# Patient Record
Sex: Male | Born: 1950
Health system: Southern US, Community
[De-identification: ages and names within clinical notes are randomized; demographics above are authoritative.]

## PROBLEM LIST (undated history)

## (undated) DIAGNOSIS — K4090 Unilateral inguinal hernia, without obstruction or gangrene, not specified as recurrent: Secondary | ICD-10-CM

## (undated) DIAGNOSIS — R911 Solitary pulmonary nodule: Secondary | ICD-10-CM

## (undated) DIAGNOSIS — R0683 Snoring: Secondary | ICD-10-CM

## (undated) DIAGNOSIS — G4733 Obstructive sleep apnea (adult) (pediatric): Secondary | ICD-10-CM

## (undated) DIAGNOSIS — M722 Plantar fascial fibromatosis: Secondary | ICD-10-CM

## (undated) DIAGNOSIS — E785 Hyperlipidemia, unspecified: Secondary | ICD-10-CM

## (undated) DIAGNOSIS — M791 Myalgia, unspecified site: Secondary | ICD-10-CM

## (undated) DIAGNOSIS — G8929 Other chronic pain: Secondary | ICD-10-CM

## (undated) DIAGNOSIS — R609 Edema, unspecified: Secondary | ICD-10-CM

## (undated) DIAGNOSIS — J342 Deviated nasal septum: Secondary | ICD-10-CM

## (undated) DIAGNOSIS — S42309A Unspecified fracture of shaft of humerus, unspecified arm, initial encounter for closed fracture: Secondary | ICD-10-CM

## (undated) DIAGNOSIS — G4730 Sleep apnea, unspecified: Secondary | ICD-10-CM

## (undated) DIAGNOSIS — R079 Chest pain, unspecified: Secondary | ICD-10-CM

## (undated) DIAGNOSIS — I10 Essential (primary) hypertension: Secondary | ICD-10-CM

## (undated) DIAGNOSIS — M1990 Unspecified osteoarthritis, unspecified site: Secondary | ICD-10-CM

## (undated) DIAGNOSIS — N200 Calculus of kidney: Secondary | ICD-10-CM

## (undated) DIAGNOSIS — K219 Gastro-esophageal reflux disease without esophagitis: Secondary | ICD-10-CM

## (undated) DIAGNOSIS — G5600 Carpal tunnel syndrome, unspecified upper limb: Secondary | ICD-10-CM

## (undated) DIAGNOSIS — F17201 Nicotine dependence, unspecified, in remission: Secondary | ICD-10-CM

## (undated) DIAGNOSIS — M2550 Pain in unspecified joint: Secondary | ICD-10-CM

## (undated) DIAGNOSIS — K5790 Diverticulosis of intestine, part unspecified, without perforation or abscess without bleeding: Secondary | ICD-10-CM

## (undated) DIAGNOSIS — Z9989 Dependence on other enabling machines and devices: Secondary | ICD-10-CM

## (undated) HISTORY — DX: Unspecified osteoarthritis, unspecified site: M19.90

## (undated) HISTORY — PX: PLANTAR FASCIA SURGERY: SHX746

## (undated) HISTORY — PX: SHOULDER ARTHROSCOPY: SHX128

## (undated) HISTORY — DX: Plantar fascial fibromatosis: M72.2

## (undated) HISTORY — DX: Nicotine dependence, unspecified, in remission: F17.201

## (undated) HISTORY — DX: Myalgia, unspecified site: M79.10

## (undated) HISTORY — PX: KNEE ARTHROSCOPY: SUR90

## (undated) HISTORY — DX: Pain in unspecified joint: M25.50

## (undated) HISTORY — DX: Hyperlipidemia, unspecified: E78.5

## (undated) HISTORY — DX: Obstructive sleep apnea (adult) (pediatric): G47.33

## (undated) HISTORY — DX: Deviated nasal septum: J34.2

## (undated) HISTORY — DX: Dependence on other enabling machines and devices: Z99.89

## (undated) HISTORY — DX: Solitary pulmonary nodule: R91.1

## (undated) HISTORY — DX: Unilateral inguinal hernia, without obstruction or gangrene, not specified as recurrent: K40.90

## (undated) HISTORY — DX: Other chronic pain: G89.29

## (undated) HISTORY — DX: Chest pain, unspecified: R07.9

## (undated) HISTORY — DX: Essential (primary) hypertension: I10

## (undated) HISTORY — DX: Calculus of kidney: N20.0

## (undated) HISTORY — DX: Edema, unspecified: R60.9

## (undated) HISTORY — DX: Unspecified fracture of shaft of humerus, unspecified arm, initial encounter for closed fracture: S42.309A

## (undated) HISTORY — DX: Gastro-esophageal reflux disease without esophagitis: K21.9

## (undated) HISTORY — PX: UPPER GI ENDOSCOPY: SHX6162

## (undated) HISTORY — PX: OTHER SURGICAL HISTORY: SHX169

## (undated) HISTORY — DX: Snoring: R06.83

## (undated) HISTORY — DX: Carpal tunnel syndrome, unspecified upper limb: G56.00

## (undated) HISTORY — DX: Diverticulosis of intestine, part unspecified, without perforation or abscess without bleeding: K57.90

## (undated) HISTORY — PX: COLONOSCOPY: SHX174

## (undated) HISTORY — DX: Sleep apnea, unspecified: G47.30

---

## 1991-09-18 HISTORY — PX: LUMBAR DISC SURGERY: SHX700

## 2007-03-18 ENCOUNTER — Ambulatory Visit: Payer: Self-pay | Admitting: Gastroenterology

## 2007-03-27 ENCOUNTER — Encounter: Payer: Self-pay | Admitting: Gastroenterology

## 2007-03-27 ENCOUNTER — Ambulatory Visit: Payer: Self-pay | Admitting: Gastroenterology

## 2007-05-14 ENCOUNTER — Ambulatory Visit: Payer: Self-pay | Admitting: Gastroenterology

## 2007-12-12 DIAGNOSIS — K209 Esophagitis, unspecified without bleeding: Secondary | ICD-10-CM | POA: Insufficient documentation

## 2007-12-12 DIAGNOSIS — R1310 Dysphagia, unspecified: Secondary | ICD-10-CM | POA: Insufficient documentation

## 2007-12-12 DIAGNOSIS — K2980 Duodenitis without bleeding: Secondary | ICD-10-CM | POA: Insufficient documentation

## 2007-12-12 DIAGNOSIS — K219 Gastro-esophageal reflux disease without esophagitis: Secondary | ICD-10-CM | POA: Insufficient documentation

## 2007-12-12 DIAGNOSIS — K5730 Diverticulosis of large intestine without perforation or abscess without bleeding: Secondary | ICD-10-CM | POA: Insufficient documentation

## 2009-03-09 ENCOUNTER — Encounter: Admission: RE | Admit: 2009-03-09 | Discharge: 2009-03-09 | Payer: Self-pay | Admitting: Unknown Physician Specialty

## 2009-03-15 ENCOUNTER — Encounter: Payer: Self-pay | Admitting: Physician Assistant

## 2009-04-25 ENCOUNTER — Encounter: Payer: Self-pay | Admitting: Physician Assistant

## 2009-09-14 ENCOUNTER — Encounter: Payer: Self-pay | Admitting: Physician Assistant

## 2009-09-15 ENCOUNTER — Encounter: Admission: RE | Admit: 2009-09-15 | Discharge: 2009-09-15 | Payer: Self-pay | Admitting: Unknown Physician Specialty

## 2009-09-15 ENCOUNTER — Encounter (INDEPENDENT_AMBULATORY_CARE_PROVIDER_SITE_OTHER): Payer: Self-pay | Admitting: *Deleted

## 2009-09-22 ENCOUNTER — Telehealth: Payer: Self-pay | Admitting: Gastroenterology

## 2009-09-23 ENCOUNTER — Ambulatory Visit: Payer: Self-pay | Admitting: Gastroenterology

## 2009-09-23 DIAGNOSIS — R1032 Left lower quadrant pain: Secondary | ICD-10-CM | POA: Insufficient documentation

## 2009-09-23 DIAGNOSIS — Z86010 Personal history of colonic polyps: Secondary | ICD-10-CM | POA: Insufficient documentation

## 2010-04-19 ENCOUNTER — Encounter (INDEPENDENT_AMBULATORY_CARE_PROVIDER_SITE_OTHER): Payer: Self-pay | Admitting: *Deleted

## 2010-06-09 ENCOUNTER — Telehealth: Payer: Self-pay | Admitting: Gastroenterology

## 2010-06-14 ENCOUNTER — Encounter (INDEPENDENT_AMBULATORY_CARE_PROVIDER_SITE_OTHER): Payer: Self-pay | Admitting: *Deleted

## 2010-07-06 ENCOUNTER — Encounter (INDEPENDENT_AMBULATORY_CARE_PROVIDER_SITE_OTHER): Payer: Self-pay | Admitting: *Deleted

## 2010-07-10 ENCOUNTER — Ambulatory Visit: Payer: Self-pay | Admitting: Gastroenterology

## 2010-07-18 ENCOUNTER — Encounter (INDEPENDENT_AMBULATORY_CARE_PROVIDER_SITE_OTHER): Payer: Self-pay | Admitting: *Deleted

## 2010-07-18 ENCOUNTER — Telehealth (INDEPENDENT_AMBULATORY_CARE_PROVIDER_SITE_OTHER): Payer: Self-pay | Admitting: *Deleted

## 2010-07-27 ENCOUNTER — Ambulatory Visit: Payer: Self-pay | Admitting: Gastroenterology

## 2010-10-18 NOTE — Letter (Signed)
Summary: [facility] Instructions  [facility]  [address] [place], [place] [address]   Phone: [phone]  Fax: [phone]       Nicholas Graham    1955/01/24    MRN: [id number]       Procedure Day Dorna Bloom:  Lenor Coffin  07/27/10     Arrival Time: 7:30AM     Procedure Time:  8:30AM     Location of Procedure:                    _ X_   [facility] (4th Floor)  PREPARATION FOR COLONOSCOPY WITH MIRALAX  Starting 5 days prior to your procedure 07/22/10 do not eat nuts, seeds, popcorn, corn, beans, peas,  salads, or any raw vegetables.  Do not take any fiber supplements (e.g. Metamucil, Citrucel, and Benefiber). ____________________________________________________________________________________________________   THE DAY BEFORE YOUR PROCEDURE         DATE: 07/26/10  DAY: WEDNESDAY  1   Drink clear liquids the entire day-NO SOLID FOOD  2   Do not drink anything colored red or purple.  Avoid juices with pulp.  No orange juice.  3   Drink at least 64 oz. (8 glasses) of fluid/clear liquids during the day to prevent dehydration and help the prep work efficiently.  CLEAR LIQUIDS INCLUDE: Water Jello Ice Popsicles Tea (sugar ok, no milk/cream) Powdered fruit flavored drinks Coffee (sugar ok, no milk/cream) Gatorade Juice: apple, white grape, white cranberry  Lemonade Clear bullion, consomm, broth Carbonated beverages (any kind) Strained chicken noodle soup Hard Candy  4   Mix the entire bottle of Miralax with 64 oz. of Gatorade/Powerade in the morning and put in the refrigerator to chill.  5   At 3:00 pm take 2 Dulcolax/Bisacodyl tablets.  6   At 4:30 pm take one Reglan/Metoclopramide tablet.  7  Starting at 5:00 pm drink one 8 oz glass of the Miralax mixture every 15-20 minutes until you have finished drinking the entire 64 oz.  You should finish drinking prep around 7:30 or 8:00 pm.  8   If you are nauseated, you may take the 2nd Reglan/Metoclopramide  tablet at 6:30 pm.        9    At 8:00 pm take 2 more DULCOLAX/Bisacodyl tablets.   THE DAY OF YOUR PROCEDURE      DATE:  07/27/10   DAY: Lenor Coffin  You may drink clear liquids until 6:30AM (2 HOURS BEFORE PROCEDURE).   MEDICATION INSTRUCTIONS  Unless otherwise instructed, you should take regular prescription medications with a small sip of water as early as possible the morning of your procedure.       OTHER INSTRUCTIONS  You will need a responsible adult at least 58 years of age to accompany you and drive you home.   This person must remain in the waiting room during your procedure.  Wear loose fitting clothing that is easily removed.  Leave jewelry and other valuables at home.  However, you may wish to bring a book to read or an iPod/MP3 player to listen to music as you wait for your procedure to start.  Remove all body piercing jewelry and leave at home.  Total time from sign-in until discharge is approximately 2-3 hours.  You should go home directly after your procedure and rest.  You can resume normal activities the day after your procedure.  The day of your procedure you should not:   Drive   Make legal decisions  Operate machinery   Drink alcohol   Return to work  You will receive specific instructions about eating, activities and medications before you leave.   The above instructions have been reviewed and explained to me by   Wyona Almas RN  July 18, 2010 10:22 AM     I fully understand and can verbalize these instructions _____________________________ Date _______

## 2010-10-18 NOTE — Progress Notes (Signed)
Summary: med concern  Medications Added MIRALAX   POWD (POLYETHYLENE GLYCOL 3350) As per prep  instructions. DULCOLAX 5 MG  TBEC (BISACODYL) Day before procedure take 2 at 3pm and 2 at 8pm. METOCLOPRAMIDE HCL 10 MG  TABS (METOCLOPRAMIDE HCL) As per prep instructions.      New instructions faxed to Pharmacy Phone Note From Pharmacy Call back at [phone]   Caller: Tito Dine tech Call For: Dr. Arlyce Dice  Summary of Call: needs permission to change prep to something cheaper Initial call taken by: Vallarie Mare,  July 18, 2010 9:49 AM    New/Updated Medications: MIRALAX   POWD (POLYETHYLENE GLYCOL 3350) As per prep  instructions. DULCOLAX 5 MG  TBEC (BISACODYL) Day before procedure take 2 at 3pm and 2 at 8pm. METOCLOPRAMIDE HCL 10 MG  TABS (METOCLOPRAMIDE HCL) As per prep instructions. Prescriptions: METOCLOPRAMIDE HCL 10 MG  TABS (METOCLOPRAMIDE HCL) As per prep instructions.  #2 x 0   Entered by:   Wyona Almas RN   Authorized by:   Louis Meckel MD   Signed by:   Wyona Almas RN on 07/18/2010   Method used:   Electronically to        [facility] (retail)       [address]       [place], [place]  [address]       Ph: [phone]       Fax: [phone]   RxID:   [id number] DULCOLAX 5 MG  TBEC (BISACODYL) Day before procedure take 2 at 3pm and 2 at 8pm.  #4 x 0   Entered by:   Wyona Almas RN   Authorized by:   Louis Meckel MD   Signed by:   Wyona Almas RN on 07/18/2010   Method used:   Electronically to        [facility] (retail)       [address]       [place], [place]  [address]       Ph: [phone]       Fax: [phone]   RxID:   [id number] MIRALAX   POWD (POLYETHYLENE GLYCOL 3350) As per prep  instructions.  #255gm x 0   Entered by:   Wyona Almas RN   Authorized by:   Louis Meckel MD   Signed by:   Wyona Almas RN on 07/18/2010   Method used:   Electronically to        [facility] (retail)       [address]  [place], [place]  [address]       Ph: [phone]       Fax: [phone]   RxID:   [id number]

## 2010-10-18 NOTE — Letter (Signed)
Summary: [facility]  [facility]   Imported By: Sherian Rein 09/27/2009 11:45:58  _____________________________________________________________________  External Attachment:    Type:   Image     Comment:   External Document

## 2010-10-18 NOTE — Letter (Signed)
Summary: [facility] Instructions  [facility]  [address] [place], [place] [address]   Phone: [phone]  Fax: [phone]       Nicholas Graham    12/11/1964    MRN: [id number]        Procedure Day Dorna Bloom:  Lenor Coffin  07/27/10     Arrival Time:  7:30AM     Procedure Time:  8:30AM     Location of Procedure:                    _ X_  [facility] (4th Floor)                      PREPARATION FOR COLONOSCOPY WITH MOVIPREP   Starting 5 days prior to your procedure 07/22/10 do not eat nuts, seeds, popcorn, corn, beans, peas,  salads, or any raw vegetables.  Do not take any fiber supplements (e.g. Metamucil, Citrucel, and Benefiber).  THE DAY BEFORE YOUR PROCEDURE         DATE: 07/26/10  DAY: WEDNESDAY  1.  Drink clear liquids the entire day-NO SOLID FOOD  2.  Do not drink anything colored red or purple.  Avoid juices with pulp.  No orange juice.  3.  Drink at least 64 oz. (8 glasses) of fluid/clear liquids during the day to prevent dehydration and help the prep work efficiently.  CLEAR LIQUIDS INCLUDE: Water Jello Ice Popsicles Tea (sugar ok, no milk/cream) Powdered fruit flavored drinks Coffee (sugar ok, no milk/cream) Gatorade Juice: apple, white grape, white cranberry  Lemonade Clear bullion, consomm, broth Carbonated beverages (any kind) Strained chicken noodle soup Hard Candy                             4.  In the morning, mix first dose of MoviPrep solution:    Empty 1 Pouch A and 1 Pouch B into the disposable container    Add lukewarm drinking water to the top line of the container. Mix to dissolve    Refrigerate (mixed solution should be used within 24 hrs)  5.  Begin drinking the prep at 5:00 p.m. The MoviPrep container is divided by 4 marks.   Every 15 minutes drink the solution down to the next mark (approximately 8 oz) until the full liter is complete.   6.  Follow completed prep with 16 oz of clear liquid of your choice  (Nothing red or purple).  Continue to drink clear liquids until bedtime.  7.  Before going to bed, mix second dose of MoviPrep solution:    Empty 1 Pouch A and 1 Pouch B into the disposable container    Add lukewarm drinking water to the top line of the container. Mix to dissolve    Refrigerate  THE DAY OF YOUR PROCEDURE      DATE: 07/27/10  DAY: THURSDAY  Beginning at 3:30AM (5 hours before procedure):         1. Every 15 minutes, drink the solution down to the next mark (approx 8 oz) until the full liter is complete.  2. Follow completed prep with 16 oz. of clear liquid of your choice.    3. You may drink clear liquids until 6:30AM (2 HOURS BEFORE PROCEDURE).   MEDICATION INSTRUCTIONS  Unless otherwise instructed, you should take regular prescription medications with a small sip of water   as early as possible the morning of your  procedure.       OTHER INSTRUCTIONS  You will need a responsible adult at least 58 years of age to accompany you and drive you home.   This person must remain in the waiting room during your procedure.  Wear loose fitting clothing that is easily removed.  Leave jewelry and other valuables at home.  However, you may wish to bring a book to read or  an iPod/MP3 player to listen to music as you wait for your procedure to start.  Remove all body piercing jewelry and leave at home.  Total time from sign-in until discharge is approximately 2-3 hours.  You should go home directly after your procedure and rest.  You can resume normal activities the  day after your procedure.  The day of your procedure you should not:   Drive   Make legal decisions   Operate machinery   Drink alcohol   Return to work  You will receive specific instructions about eating, activities and medications before you leave.    The above instructions have been reviewed and explained to me by   Wyona Almas RN  July 10, 2010 5:02 PM     I fully understand  and can verbalize these instructions _____________________________ Date _________

## 2010-10-18 NOTE — Letter (Signed)
Summary: Colonoscopy Letter  [facility]  [address] [place], [place] [address]   Phone: [phone]  Fax: [phone]      April 19, 2010 MRN: [id number]   [facility] [address] [place], [place]  [address]   Dear Mr. Nicholas Graham,   According to your medical record, it is time for you to schedule a Colonoscopy. The American Cancer Society recommends this procedure as a method to detect early colon cancer. Patients with a family history of colon cancer, or a personal history of colon polyps or inflammatory bowel disease are at increased risk.  This letter has been generated based on the recommendations made at the time of your procedure. If you feel that in your particular situation this may no longer apply, please contact our office.  Please call our office at [phone] to schedule this appointment or to update your records at your earliest convenience.  Thank you for cooperating with [place] to provide you with the very best care possible.   Sincerely,   Barbette Hair. Arlyce Dice, M.D.  [facility] [phone]

## 2010-10-18 NOTE — Letter (Signed)
Summary: Lawernce Ion, M.D.  Eugene Garnet Precious Gilding, M.D.   Imported By: Sherian Rein 09/27/2009 11:47:54  _____________________________________________________________________  External Attachment:    Type:   Image     Comment:   External Document

## 2010-10-18 NOTE — Assessment & Plan Note (Signed)
Summary: ABD. PAIN/BLOOD IN STOOLS         (DR.KAPLAN PT.)     DEBORAH    History of Present Illness Visit Type: Follow-up Visit Primary GI MD: Melvia Heaps MD [facility] Primary Provider: Harl Bowie, MD Chief Complaint: Patient having groin pain, he states that th epain is below where he had a hernia repair 4 months ago. He also complains of having blood in his stools and in his urine. He denies any constipation.  History of Present Illness:   60 YO MALE  KNOWN TO DR Arlyce Dice . HE HAD COLONOSCOPY 7/08 WITH DIVERTICULOSIS AND ONE ADENOMATOUS POLYP. ALSO WITH HX GERD,DUODENITIS. HE HAD A LEFT INGUINAL HERNIA REPAIR DONE AT [facility] 8/10 WITH MESH.  HE IS REFERRED TODAY FOR LLQ PAIN. HE SAYS THIS FEELS DIFFERENT THAN HIS HERNIA PAIN,AND IS LOWER IN GROIN. HAS HAD PAIN FOR ABOUT 3-4 WEEKS. 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 THE PAIN IS DESCRIBED AS BURNING,SOME RADIATION TOWARD HIS BACK. HE HAS HAD SOME ONGOING LOW BACK PAIN. APPETITE FINE, WEIGHT GOING UP. NO CHANGE IN BOWEL HABITS UNTIL A WEEK OR SO AGO AFTER HE STARTED PROTONIX AND SIMVISTATIN-STOOLS LOOSE 2-3 /DAY. HE HAD SEEN A SMALL AMT OF BLOOD ON TISSUE WHICH HAS RESOLVED.HE SAW DR JUDGE FOR PHSICAL IN DECEMBER-LABS REVIEWED AND UNREMARLABLE. CT ABD/PELVIS 12/30 WAS READ AS STABLE RIGHT INGUINAL HERNIA,AND ANTERIOR MIDLINE ABDOMINAL WALL HERNIA,NO DIVERTICULITIS. HE SAYS HE WAS TOLD HE HAD SOME MICROSCOPIC HEMATURIA-TO HAVE REPEAT UA NEXT WEEK.   NO CHANGE IN PAIN WITH by mouth INTAKE,WORSE WITH CERTAIN POSITIONS-GETTING UP.   GI Review of Systems    Reports abdominal pain, acid reflux, heartburn, and  weight gain.     Location of  Abdominal pain: LLQ.    Denies belching, bloating, chest pain, dysphagia with liquids, dysphagia with solids, loss of appetite, nausea, vomiting, vomiting blood, and  weight loss.      Reports diverticulosis.     Denies anal fissure, black tarry stools, change in bowel habit, constipation,  diarrhea, fecal incontinence, heme positive stool, hemorrhoids, irritable bowel syndrome, jaundice, light color stool, liver problems, rectal bleeding, and  rectal pain. Preventive Screening-Counseling & Management  Alcohol-Tobacco     Smoking Status: quit      Drug Use:  no.      Current Medications (verified): 1)  Protonix 40 Mg Tbec (Pantoprazole Sodium) .... Take One By Mouth Once Daily 2)  Simvastatin 20 Mg Tabs (Simvastatin) .... Take One By Mouth Once Daily 3)  Tylenol 325 Mg Tabs (Acetaminophen) .... Take As Needed  Allergies (verified): 1)  ! Penicillin  Past History:  Past Medical History: Current Problems:  DIVERTICULOSIS, COLON (ICD-562.10) HYPERLIPIDEMIA ADENOMATOUS POLYP 7/08 GERD (ICD-530.81) DUODENITIS (ICD-535.60)  Past Surgical History: back surgery inguinal hernia left  Family History: Family history of brain cancer: mother Family history of lung cancer: mother (non smoker), father (smoker) No FH of Colon Cancer Family History of Heart Disease: maternal uncles  Social History: Patient is a former smoker. quit 7 years Alcohol Use - yes Daily Caffeine Use coffee Illicit Drug Use - no Smoking Status:  quit Drug Use:  no  Review of Systems       The patient complains of back pain, blood in urine, fatigue, headaches-new, muscle pains/cramps, shortness of breath, and sleeping problems.  The patient denies allergy/sinus, anemia, anxiety-new, arthritis/joint pain, breast changes/lumps, change in vision, confusion, cough, coughing up blood, depression-new, fainting, fever, hearing problems, heart murmur, heart rhythm  changes, itching, menstrual pain, night sweats, nosebleeds, pregnancy symptoms, skin rash, sore throat, swelling of feet/legs, swollen lymph glands, thirst - excessive , urination - excessive , urination changes/pain, urine leakage, vision changes, and voice change.         ROS OTHERWISE AS IN HPI  Vital Signs:  Patient profile:   60 year  old male Height:      74 inches Weight:      232.4 pounds BMI:     29.95 Pulse rate:   88 / minute Pulse rhythm:   regular BP sitting:   130 / 78  (left arm) Cuff size:   regular  Vitals Entered By: Harlow Mares CMA Duncan Dull) (September 23, 2009 1:40 PM)  Physical Exam  General:  Well developed, well nourished, no acute distress. Head:  Normocephalic and atraumatic. Eyes:  PERRLA, no icterus. Lungs:  Clear throughout to auscultation. Heart:  Regular rate and rhythm; no murmurs, rubs,  or bruits. Abdomen:  SOFT, MINIMAL TENDERNESS IN LLQ,NO MASS OR HSM,BS+. HE IS VERY TENDER BELOW HIS INGUINAL HERNIA INCISION ON THE LEFT AND INTO THE LEFT GROIN Rectal:  NOT DONE Extremities:  No clubbing, cyanosis, edema or deformities noted. Neurologic:  Alert and  oriented x4;  grossly normal neurologically. Psych:  Alert and cooperative. Normal mood and affect.   Impression & Recommendations:  Problem # 1:  ABDOMINAL PAIN-LLQ (ICD-789.04) Assessment New I DO NOT THINK HIS PAIN IS INTRA-ABDOMINAL ,SUSPECT HE HAS A RECURRENT OR NEW FEMORAL HERNIA ON THE LEFT.  PT ALREADY HAS APPT TO SEE DR Derrell Lolling NEXT WEEK -ADVISED TO KEEP THIS ULTRAM 50 MG Q 6-8 HOURS AS NEEDED FOR PAIN  Problem # 2:  PERSONAL HX COLONIC POLYPS (ICD-V12.72) Assessment: Comment Only ADENOMATOUS,LAST COLON 7/08  Problem # 3:  DIVERTICULOSIS, COLON (ICD-562.10) Assessment: Comment Only  Problem # 4:  GERD (ICD-530.81) Assessment: Unchanged PT WITH LOOSE STOOLS SINCE STARTING PROTONIX-WILL STOP AND DIVE TRIAL OF NEXIUM 40 MG DAILY IN AM BEFORE BREAKFAST. IF HE TOLERATES WILL  CALL IN RX.  Patient Instructions: 1)  We sent perscription for Ultram to [facility] in [place]. 2)  We have given you samples of Nexium, take 1 30 min prior to breakfast.  Let us know if this agrees with you, if so we can send a perscription. 3)  Copy sent to : Harl Bowie, MD 4)  The medication list was reviewed and reconciled.  All  changed / newly prescribed medications were explained.  A complete medication list was provided to the patient / caregiver. Prescriptions: ULTRAM 50 MG TABS (TRAMADOL HCL) Take 1 tab every 6-8 hours as needed for pain  #30 x 0   Entered by:   Lowry Ram NCMA   Authorized by:   Sammuel Cooper PA-c   Signed by:   Lowry Ram NCMA on 09/23/2009   Method used:   Electronically to        [facility]* (retail)       [address]       [place], [place]  [address]       Ph: [phone]       Fax: [phone]   RxID:   [id number]

## 2010-10-18 NOTE — Progress Notes (Signed)
Summary: Triage-Colon Recall   Phone Note Call from Patient Call back at Home Phone [phone]   Caller: Hulan Fray  Call For: Dr. Arlyce Dice Reason for Call: Talk to Nurse Summary of Call: pt. received a recall COL letter for July and thought it was recommended every 5 yrs.  Initial call taken by: Karna Christmas,  June 09, 2010 3:56 PM  Follow-up for Phone Call        Dr.Lillyauna Jenkinson--Please review Colon report from 03-27-07 and advise.  Follow-up by: Laureen Ochs LPN,  June 09, 2010 4:41 PM  Additional Follow-up for Phone Call Additional follow up Details #1::        Recommend 3 year f/u b/c of multiplicity of polyps and polyp size Additional Follow-up by: Louis Meckel MD,  June 12, 2010 8:46 AM    Additional Follow-up for Phone Call Additional follow up Details #2::    Above MD orders reviewed with patient's wife. She states she will discuss this with him and callback to get Colonoscopy scheduled.  Follow-up by: Laureen Ochs LPN,  June 12, 2010 11:33 AM

## 2010-10-18 NOTE — Op Note (Signed)
Summary: LT ingunal hernia repair/[facility]  LT ingunal hernia repair/[facility]   Imported By: Sherian Rein 09/27/2009 11:44:08  _____________________________________________________________________  External Attachment:    Type:   Image     Comment:   External Document

## 2010-10-18 NOTE — Progress Notes (Signed)
Summary: Triage   Phone Note Call from Patient Call back at Home Phone [phone]   Caller: Nicki Reaper   Call For: Dr. Arlyce Dice Reason for Call: Talk to Nurse Summary of Call: Pt is having severe abdominal pain and blood in stool. 1st available is Feb. and patient insisted on being seen sooner Initial call taken by: Karna Christmas,  September 22, 2009 2:19 PM  Follow-up for Phone Call        Per pr. wife-Pt. had an inginual hernia repair in August. Pt. c/o pain in his LLQ, blood in his urine and some blood in his stools. Pt. states he sees blood on tissue 1-2 times a week. Pt. had a CT done last week, was told he had diverticulosis. Pt. and his wife want pt. seen ASAP. They decline to call the surgeon, pt. states he doesn't like him. Pt. will see Mike Gip PAC on 09-23-09 at 1:30pm. Pt. has been advised to bring all records from his PCP for review.  Follow-up by: Laureen Ochs LPN,  September 22, 2009 2:57 PM

## 2010-10-18 NOTE — Procedures (Signed)
Summary: Colonoscopy  Patient: Nicholas Graham Note: All result statuses are Final unless otherwise noted.  Tests: (1) Colonoscopy (COL)   COL Colonoscopy           DONE     [facility]     [address]     [place], [place]  [address]           COLONOSCOPY PROCEDURE REPORT           PATIENT:  Nicholas Graham  MR#:  [id number]     BIRTHDATE:  [birth year], 59 yrs. old  GENDER:  male           ENDOSCOPIST:  Barbette Hair. Arlyce Dice, MD     Referred by:           PROCEDURE DATE:  07/27/2010     PROCEDURE:  Diagnostic Colonoscopy     ASA CLASS:  Class II     INDICATIONS:  1) screening  2) history of pre-cancerous     (adenomatous) colon polyps Index polypectomy 2008           MEDICATIONS:   Fentanyl 75 mcg IV, Versed 7 mg IV           DESCRIPTION OF PROCEDURE:   After the risks benefits and     alternatives of the procedure were thoroughly explained, informed     consent was obtained.  No rectal exam performed. The [id number]     endoscope was introduced through the anus and advanced to the     cecum, which was identified by both the appendix and ileocecal     valve, without limitations.  The quality of the prep was     excellent, using MoviPrep.  The instrument was then slowly     withdrawn as the colon was fully examined.     <<PROCEDUREIMAGES>>           FINDINGS:  Mild diverticulosis was found in the sigmoid colon (see     image18).  This was otherwise a normal examination of the colon     (see image2, image4, image5, image7, image9, image13, image20, and     image21).   Retroflexed views in the rectum revealed no     abnormalities.    The time to cecum =  7.50  minutes. The scope     was then withdrawn (time =  6.25  min) from the patient and the     procedure completed.           COMPLICATIONS:  None           ENDOSCOPIC IMPRESSION:     1) Mild diverticulosis in the sigmoid colon     2) Otherwise normal examination     RECOMMENDATIONS:     1) Colonoscopy in 5  years           REPEAT EXAM:   5 year(s) Colonoscopy           ______________________________     Barbette Hair. Arlyce Dice, MD           CC: Assunta Gambles MD, Jerl Mina, MD           n.     Rosalie Doctor:   Barbette Hair. Kaplan at 07/27/2010 09:06 AM           Konrad Felix, [id number]  Note: An exclamation mark (!) indicates a result that was not dispersed into the flowsheet. Document Creation Date: 07/27/2010 9:06 AM _______________________________________________________________________  (1) Order  result status: Final Collection or observation date-time: 07/27/2010 08:58 Requested date-time:  Receipt date-time:  Reported date-time:  Referring Physician:   Ordering Physician: Melvia Heaps [phone]) Specimen Source:  Source: Launa Grill Order Number: [id number] Lab site:   Appended Document: Colonoscopy    Clinical Lists Changes  Observations: Added new observation of COLONNXTDUE: 07/2015 (07/27/2010 13:07)

## 2010-10-18 NOTE — Miscellaneous (Signed)
Summary: [facility] Previsit/prep  Clinical Lists Changes  Medications: Added new medication of MOVIPREP 100 GM  SOLR (PEG-KCL-NACL-NASULF-NA ASC-C) As per prep instructions. - Signed Rx of MOVIPREP 100 GM  SOLR (PEG-KCL-NACL-NASULF-NA ASC-C) As per prep instructions.;  #1 x 0;  Signed;  Entered by: Wyona Almas RN;  Authorized by: Louis Meckel MD;  Method used: Electronically to [facility] [address], [place], [place]  [address], Ph: [phone], Fax: [phone] Observations: Added new observation of ALLERGY REV: Done (07/10/2010 15:57)    Prescriptions: MOVIPREP 100 GM  SOLR (PEG-KCL-NACL-NASULF-NA ASC-C) As per prep instructions.  #1 x 0   Entered by:   Wyona Almas RN   Authorized by:   Louis Meckel MD   Signed by:   Wyona Almas RN on 07/10/2010   Method used:   Electronically to        [facility] (retail)       [address]       [place], [place]  [address]       Ph: [phone]       Fax: [phone]   RxID:   [id number]

## 2010-10-18 NOTE — Letter (Signed)
Summary: Pre Visit Letter Revised  [facility]  [address] [place], [place] [address]   Phone: [phone]  Fax: [phone]        06/14/2010 MRN: [id number] [facility] [address] [place], [place]  [address]             Procedure Date:  07/27/2010   Welcome to the Gastroenterology Division at [facility].    You are scheduled to see a nurse for your pre-procedure visit on 07/10/2010 at 4:30PM on the 3rd floor at [facility], [address]  We ask that you try to arrive at our office 15 minutes prior to your appointment time to allow for check-in.  Please take a minute to review the attached form.  If you answer "Yes" to one or more of the questions on the first page, we ask that you call the person listed at your earliest opportunity.  If you answer "No" to all of the questions, please complete the rest of the form and bring it to your appointment.    Your nurse visit will consist of discussing your medical and surgical history, your immediate family medical history, and your medications.   If you are unable to list all of your medications on the form, please bring the medication bottles to your appointment and we will list them.  We will need to be aware of both prescribed and over the counter drugs.  We will need to know exact dosage information as well.    Please be prepared to read and sign documents such as consent forms, a financial agreement, and acknowledgement forms.  If necessary, and with your consent, a friend or relative is welcome to sit-in on the nurse visit with you.  Please bring your insurance card so that we may make a copy of it.  If your insurance requires a referral to see a specialist, please bring your referral form from your primary care physician.  No co-pay is required for this nurse visit.     If you cannot keep your appointment, please call [phone] to cancel or reschedule prior to your appointment date.  This  allows [place] the opportunity to schedule an appointment for another patient in need of care.    Thank you for choosing [facility] for your medical needs.  We appreciate the opportunity to care for you.  Please visit [place] at our website  to learn more about our practice.  Sincerely, The Gastroenterology Division

## 2010-10-18 NOTE — Procedures (Signed)
Summary: CT ABD & PELVIS   CT Scan  Procedure date:  09/15/2009  Findings:      Results:  CT Abdomen With Contrast - STATUS: Final  IMAGE                                     Perform Date: 30Dec10 15:19  Ordered By: DEFAULT MD , PROVIDER         Ordered Date: 30Dec10 14:44  Facility: [facility]                              Department: CT  Service Report Text  [facility] Accession Number: [id number]      Clinical Data:  Abdomen and pelvis pain; inguinal hernia repair in   August    CT ABDOMEN AND PELVIS WITH CONTRAST    Technique:  Multidetector CT imaging of the abdomen and pelvis was   performed using the standard protocol following bolus   administration of intravenous contrast.    Contrast: 125 ml Omni 300    Comparison:  March 09, 2009    CT ABDOMEN    Findings:  The lung bases are clear.  Hepatic and splenic   granulomas are unchanged.  Otherwise, the liver, spleen,   gallbladder, pancreas, adrenal glands, and both kidneys have a   normal appearance.  There is no free abdominal fluid or adenopathy.   The bowel is unremarkable with no evidence of gross obstruction or   inflammation.There is a small midline abdominal wall hernia   containing only fat, stable.    CT PELVIS    Findings:  The left inguinal hernia has been repaired.  The right   inguinal hernia containing a small amount of fat only appears   stable.  There is sigmoid diverticulosis without radiographic   evidence of diverticulitis.  There is no adenopathy or free fluid   within the pelvis.  The osseous structures are unremarkable.    IMPRESSION:   Stable right inguinal and anterior abdominal wall hernias   containing a small amount of fat only.    Sigmoid diverticulosis without radiographic evidence of   diverticulitis.    Read By:  Dorthula Nettles,  M.D.   Released By:  Dorthula Nettles,  M.D.  Additional Information  HL7 RESULT STATUS : F  External image : [id number]  External IF Update Timestamp :  2009-09-15:15:35:00.000000

## 2011-01-30 NOTE — Letter (Signed)
March 18, 2007    Harl Bowie, MD  [address]  [place], [place] [address]   RE:  Nicholas Graham, Nicholas Graham  MRN:  [id number]  /  DOB:  February 16, 1951   Dear Dr. Sharee Pimple:   Upon your kind referral, I had the pleasure of evaluating your patient  and I am pleased to offer my findings.  I saw Azeem Poorman in the  office today.  Enclosed is a copy of my progress note that details my  findings and recommendations.   Thank you for the opportunity to participate in your patient's care.    Sincerely,      Barbette Hair. Arlyce Dice, MD,FACG  Electronically Signed    RDK/MedQ  DD: 03/18/2007  DT: 03/18/2007  Job #: [id number]

## 2011-01-30 NOTE — Assessment & Plan Note (Signed)
[facility]                         GASTROENTEROLOGY OFFICE NOTE   GEORDIE, NOONEY                        MRN:          [id number]  DATE:05/14/2007                            DOB:          [birth year]    PROBLEM:  Abdominal pain.   Mr. Roethler has returned for a scheduled GI followup.  Upper endoscopy  demonstrated mild duodenitis.  Colonoscopy was performed because of  recurrent hematochezia.  A 20-mm tubulovillous adenoma was identified  and removed.   On twice-a-day Protonix, Mr. Masini has relief of his pyrosis, though  he is still complaining of postprandial burning periumbilical pain.  He  is also having loose stools with minimal amounts of rectal bleeding.   EXAMINATION:  Pulse 96.  Blood pressure 136/88.  Weight 221.   IMPRESSION:  1. Nonspecific abdominal discomfort.  2. Gastroesophageal reflux disease.  3. Colon polyps.  4. Diarrhea.  Likely secondary to high-dose Protonix.   RECOMMENDATIONS:  1. Lower Protonix to 40 mg a day.  2. Trial of Levbid 0.375 mg twice a day for 5 to 7 days.  3. Followup colonoscopy in approximately 3 years.     Barbette Hair. Arlyce Dice, MD,FACG  Electronically Signed    RDK/MedQ  DD: 05/14/2007  DT: 05/15/2007  Job #: [id number]

## 2011-01-30 NOTE — Assessment & Plan Note (Signed)
[facility]                         GASTROENTEROLOGY OFFICE NOTE   JODIE, LEINER                        MRN:          [id number]  DATE:03/18/2007                            DOB:          14-Sep-1951    REASON FOR CONSULTATION:  1. Abdominal discomfort.  2. Rectal bleeding.   REASON:  Mr. Borum is pleasant 60 year old white male referred through  the courtesy of Dr. Sharee Pimple for evaluation.  He complains of postprandial  upper abdominal discomfort and pain in the substernal area as well.  He  complains of pyrosis.  Immediately postprandially he develops  distention.  He is also having some dysphagia to solids, especially  pills.  With Nexium,  pyrosis has improved, though he has developed  diarrhea.  Distention and discomfort continue.  Carafate has not  improved his symptoms either.  He is also complaining of rectal  bleeding.  Over the past year he has seen small amounts of bright red  blood or dark blood mixed with his stools.  He denies change of bowel  habits until he recently started his Nexium.   PAST MEDICAL HISTORY:  Is unremarkable.  He is status postback surgery   FAMILY HISTORY:  Family history is pertinent for  parents with lung  cancer.   MEDICATIONS:  Include:  1. Nexium 40 mg twice daily.  2. Carafate 1 gram 4 times daily.   ALLERGIES:  He is allergic to PENICILLIN.   He is an ex-smoker.  He drinks 4 to 5 beers on a weekend.  He is married  and works as a Lawyer.   REVIEW OF SYSTEMS:  Was reviewed and is negative.   On exam pulse 72, blood pressure 126/80, weight 216.   PHYSICAL EXAMINATION:  VITAL SIGNS: On exam pulse 72, blood pressure  126/80, weight 216.  HEENT: EOMI. PERRLA. Sclerae are anicteric.  Conjunctivae are pink.  NECK:  Supple without thyromegaly, adenopathy or carotid bruits.  CHEST:  Clear to auscultation and percussion without adventitious  sounds.  CARDIAC:  Regular rhythm; normal S1  S2.  There are no murmurs, gallops  or rubs.  ABDOMEN:  There is mild left lower quadrant tenderness without guarding  or rebound. There are no abdominal masses or organomegaly.  EXTREMITIES:  Full range of motion.  No cyanosis, clubbing or edema.  RECTAL:  Deferred.   IMPRESSION:  1. Non-specific dyspepsia - probably related to gastroesophageal      reflux disease.  2. Dysphagia - rule out early esophageal stricture.  3. Limited rectal bleeding - rule out colonic bleeding sources      including polyps, hemorrhoids and neoplasm.  4. Diarrhea - likely secondary to Nexium.   RECOMMENDATIONS:  1. I have given Mr. Musson samples of Zegerid and Protonix to see if      his symptoms improve without diarrhea.  2. Colonoscopy and upper endoscopy (to be done at the same time      because the patient is very anxious regarding the procedures and is      reluctant to return).     Barbette Hair.  Arlyce Dice, MD,FACG  Electronically Signed    RDK/MedQ  DD: 03/18/2007  DT: 03/18/2007  Job #: [id number]   cc:   Harl Bowie, MD

## 2011-01-30 NOTE — Letter (Signed)
March 18, 2007    Mr. Nicholas Graham  __________   RE:  MERCER, PEIFER  MRN:  [id number]  /  DOB:  02-08-1951   Dear Mr. Nicholas Graham:   It is my pleasure to have treated you recently as a new patient in my  office.  I appreciate your confidence and the opportunity to participate  in your care.   Since I do have a busy inpatient endoscopy schedule and office schedule,  my office hours vary weekly.  I am, however, available for emergency  calls every day through my office.  If I cannot promptly meet an urgent  office appointment, another one of our gastroenterologists will be able  to assist you.   My well-trained staff are prepared to help you at all times.  For  emergencies after office hours, a physician from our gastroenterology  section is always available through my 24-hour answering service.   While you are under my care, I encourage discussion of your questions  and concerns, and I will be happy to return your calls as soon as I am  available.   Once again, I welcome you as a new patient and I look forward to a happy  and healthy relationship.    Sincerely,     Nicholas Graham. Arlyce Dice, MD,FACG  Electronically Signed   RDK/MedQ  DD: 03/18/2007  DT: 03/18/2007  Job #: [id number]

## 2011-01-30 NOTE — Letter (Signed)
March 18, 2007    Harl Bowie   RE:  MAYNARD, DAVID  MRN:  [id number]  /  DOB:  11-02-1950   Dear Dr. Sharee Pimple:   It is my pleasure to have treated you recently as a new patient in my  office. I appreciate your confidence and the opportunity to participate  in your care.   Since I do have a busy inpatient endoscopy schedule and office schedule,  my office hours vary weekly. I am, however, available for emergency  calls every day through my office. If I cannot promptly meet an urgent  office appointment, another one of our gastroenterologists will be able  to assist you.   My well-trained staff are prepared to help you at all times. For  emergencies after office hours, a physician from our gastroenterology  section is always available through my 24-hour answering service.   While you are under my care, I encourage discussion of your questions  and concerns, and I will be happy to return your calls as soon as I am  available.   Once again, I welcome you as a new patient and I look forward to a happy  and healthy relationship.    Sincerely,      Barbette Hair. Arlyce Dice, MD,FACG  Electronically Signed   RDK/MedQ  DD: 03/18/2007  DT: 03/18/2007  Job #: [id number]

## 2011-08-21 ENCOUNTER — Other Ambulatory Visit (HOSPITAL_COMMUNITY): Payer: Self-pay | Admitting: Family Medicine

## 2011-08-21 DIAGNOSIS — G9050 Complex regional pain syndrome I, unspecified: Secondary | ICD-10-CM

## 2011-09-04 ENCOUNTER — Ambulatory Visit (HOSPITAL_COMMUNITY): Payer: Self-pay

## 2011-09-04 ENCOUNTER — Ambulatory Visit (HOSPITAL_COMMUNITY)
Admission: RE | Admit: 2011-09-04 | Discharge: 2011-09-04 | Disposition: A | Discharge status: Home / Self Care | Payer: Worker's Compensation | Source: Ambulatory Visit | Attending: Family Medicine | Admitting: Family Medicine

## 2011-09-04 DIAGNOSIS — G9050 Complex regional pain syndrome I, unspecified: Secondary | ICD-10-CM

## 2011-09-04 DIAGNOSIS — M7989 Other specified soft tissue disorders: Secondary | ICD-10-CM | POA: Insufficient documentation

## 2011-09-04 DIAGNOSIS — M25539 Pain in unspecified wrist: Secondary | ICD-10-CM | POA: Insufficient documentation

## 2011-09-18 HISTORY — PX: INGUINAL HERNIA REPAIR: SUR1180

## 2013-07-09 ENCOUNTER — Ambulatory Visit: Payer: Worker's Compensation | Admitting: Nurse Practitioner

## 2013-07-16 ENCOUNTER — Ambulatory Visit (INDEPENDENT_AMBULATORY_CARE_PROVIDER_SITE_OTHER): Payer: BC Managed Care – PPO | Admitting: Nurse Practitioner

## 2013-07-16 ENCOUNTER — Encounter: Payer: Self-pay | Admitting: Nurse Practitioner

## 2013-07-16 VITALS — BP 138/84 | HR 100 | Temp 99.3°F | Resp 18 | Ht 74.0 in | Wt 242.0 lb

## 2013-07-16 DIAGNOSIS — Z0000 Encounter for general adult medical examination without abnormal findings: Secondary | ICD-10-CM

## 2013-07-16 DIAGNOSIS — R1032 Left lower quadrant pain: Secondary | ICD-10-CM

## 2013-07-16 DIAGNOSIS — Z6832 Body mass index (BMI) 32.0-32.9, adult: Secondary | ICD-10-CM

## 2013-07-16 NOTE — Patient Instructions (Signed)
Decrease your risk of heart disease, diabetes, and cancer by walking every day. Goal: 30 minutes every day. Cut back on sugar. Eat 5 servings of fruit and veggies daily-any type of fruit is good for you. Fresh is the best. I am not sure what is causing your belly tenderness. I will review records from Dr Arlyce Dice. Our office will call you with lab results. Nice to see you.  Preventive Care for Adults, Male A healthy lifestyle and preventive care can promote health and wellness. Preventive health guidelines for men include the following key practices:  A routine yearly physical is a good way to check with your caregiver about your health and preventative screening. It is a chance to share any concerns and updates on your health, and to receive a thorough exam.  Visit your dentist for a routine exam and preventative care every 6 months. Brush your teeth twice a day and floss once a day. Good oral hygiene prevents tooth decay and gum disease.  The frequency of eye exams is based on your age, health, family medical history, use of contact lenses, and other factors. Follow your caregiver's recommendations for frequency of eye exams.  Eat a healthy diet. Foods like vegetables, fruits, whole grains, low-fat dairy products, and lean protein foods contain the nutrients you need without too many calories. Decrease your intake of foods high in solid fats, added sugars, and salt. Eat the right amount of calories for you.Get information about a proper diet from your caregiver, if necessary.  Regular physical exercise is one of the most important things you can do for your health. Most adults should get at least 150 minutes of moderate-intensity exercise (any activity that increases your heart rate and causes you to sweat) each week. In addition, most adults need muscle-strengthening exercises on 2 or more days a week.  Maintain a healthy weight. The body mass index (BMI) is a screening tool to identify possible  weight problems. It provides an estimate of body fat based on height and weight. Your caregiver can help determine your BMI, and can help you achieve or maintain a healthy weight.For adults 20 years and older:  A BMI below 18.5 is considered underweight.  A BMI of 18.5 to 24.9 is normal.  A BMI of 25 to 29.9 is considered overweight.  A BMI of 30 and above is considered obese.  Maintain normal blood lipids and cholesterol levels by exercising and minimizing your intake of saturated fat. Eat a balanced diet with plenty of fruit and vegetables. Blood tests for lipids and cholesterol should begin at age 48 and be repeated every 5 years. If your lipid or cholesterol levels are high, you are over 50, or you are a high risk for heart disease, you may need your cholesterol levels checked more frequently.Ongoing high lipid and cholesterol levels should be treated with medicines if diet and exercise are not effective.  If you smoke, find out from your caregiver how to quit. If you do not use tobacco, do not start.  If you choose to drink alcohol, do not exceed 2 drinks per day. One drink is considered to be 12 ounces (355 mL) of beer, 5 ounces (148 mL) of wine, or 1.5 ounces (44 mL) of liquor.  Avoid use of street drugs. Do not share needles with anyone. Ask for help if you need support or instructions about stopping the use of drugs.  High blood pressure causes heart disease and increases the risk of stroke. Your blood pressure  should be checked at least every 1 to 2 years. Ongoing high blood pressure should be treated with medicines, if weight loss and exercise are not effective.  If you are 35 to 60 years old, ask your caregiver if you should take aspirin to prevent heart disease.  Diabetes screening involves taking a blood sample to check your fasting blood sugar level. This should be done once every 3 years, after age 22, if you are within normal weight and without risk factors for diabetes.  Testing should be considered at a younger age or be carried out more frequently if you are overweight and have at least 1 risk factor for diabetes.  Colorectal cancer can be detected and often prevented. Most routine colorectal cancer screening begins at the age of 59 and continues through age 32. However, your caregiver may recommend screening at an earlier age if you have risk factors for colon cancer. On a yearly basis, your caregiver may provide home test kits to check for hidden blood in the stool. Use of a small camera at the end of a tube, to directly examine the colon (sigmoidoscopy or colonoscopy), can detect the earliest forms of colorectal cancer. Talk to your caregiver about this at age 18, when routine screening begins. Direct examination of the colon should be repeated every 5 to 10 years through age 62, unless early forms of pre-cancerous polyps or small growths are found.  Hepatitis C blood testing is recommended for all people born from [birth year] through 1965 and any individual with known risks for hepatitis C.  Practice safe sex. Use condoms and avoid high-risk sexual practices to reduce the spread of sexually transmitted infections (STIs). STIs include gonorrhea, chlamydia, syphilis, trichomonas, herpes, HPV, and human immunodeficiency virus (HIV). Herpes, HIV, and HPV are viral illnesses that have no cure. They can result in disability, cancer, and death.  A one-time screening for abdominal aortic aneurysm (AAA) and surgical repair of large AAAs by sound wave imaging (ultrasonography) is recommended for ages 20 to 42 years who are current or former smokers.  Healthy men should no longer receive prostate-specific antigen (PSA) blood tests as part of routine cancer screening. Consult with your caregiver about prostate cancer screening.  Testicular cancer screening is not recommended for adult males who have no symptoms. Screening includes self-exam, caregiver exam, and other screening  tests. Consult with your caregiver about any symptoms you have or any concerns you have about testicular cancer.  Use sunscreen with skin protection factor (SPF) of 30 or more. Apply sunscreen liberally and repeatedly throughout the day. You should seek shade when your shadow is shorter than you. Protect yourself by wearing long sleeves, pants, a wide-brimmed hat, and sunglasses year round, whenever you are outdoors.  Once a month, do a whole body skin exam, using a mirror to look at the skin on your back. Notify your caregiver of new moles, moles that have irregular borders, moles that are larger than a pencil eraser, or moles that have changed in shape or color.  Stay current with required immunizations.  Influenza. You need a dose every fall (or winter). The composition of the flu vaccine changes each year, so being vaccinated once is not enough.  Pneumococcal polysaccharide. You need 1 to 2 doses if you smoke cigarettes or if you have certain chronic medical conditions. You need 1 dose at age 36 (or older) if you have never been vaccinated.  Tetanus, diphtheria, pertussis (Tdap, Td). Get 1 dose of Tdap vaccine if you  are younger than age 78 years, are over 33 and have contact with an infant, are a Research scientist (physical sciences), or simply want to be protected from whooping cough. After that, you need a Td booster dose every 10 years. Consult your caregiver if you have not had at least 3 tetanus and diphtheria-containing shots sometime in your life or have a deep or dirty wound.  HPV. This vaccine is recommended for males 13 through 60 years of age. This vaccine may be given to men 22 through 60 years of age who have not completed the 3 dose series. It is recommended for men through age 18 who have sex with men or whose immune system is weakened because of HIV infection, other illness, or medications. The vaccine is given in 3 doses over 6 months.  Measles, mumps, rubella (MMR). You need at least 1 dose of MMR  if you were born in 1957 or later. You may also need a 2nd dose.  Meningococcal. If you are age 57 to 57 years and a Orthoptist living in a residence hall, or have one of several medical conditions, you need to get vaccinated against meningococcal disease. You may also need additional booster doses.  Zoster (shingles). If you are age 55 years or older, you should get this vaccine.  Varicella (chickenpox). If you have never had chickenpox or you were vaccinated but received only 1 dose, talk to your caregiver to find out if you need this vaccine.  Hepatitis A. You need this vaccine if you have a specific risk factor for hepatitis A virus infection, or you simply wish to be protected from this disease. The vaccine is usually given as 2 doses, 6 to 18 months apart.  Hepatitis B. You need this vaccine if you have a specific risk factor for hepatitis B virus infection or you simply wish to be protected from this disease. The vaccine is given in 3 doses, usually over 6 months. Preventative Service / Frequency Ages 14 to 22  Blood pressure check.** / Every 1 to 2 years.  Lipid and cholesterol check.** / Every 5 years beginning at age 97.  Hepatitis C blood test.** / For any individual with known risks for hepatitis C.  Skin self-exam. / Monthly.  Influenza immunization.** / Every year.  Pneumococcal polysaccharide immunization.** / 1 to 2 doses if you smoke cigarettes or if you have certain chronic medical conditions.  Tetanus, diphtheria, pertussis (Tdap,Td) immunization. / A one-time dose of Tdap vaccine. After that, you need a Td booster dose every 10 years.  HPV immunization. / 3 doses over 6 months, if 26 and younger.  Measles, mumps, rubella (MMR) immunization. / You need at least 1 dose of MMR if you were born in 1957 or later. You may also need a 2nd dose.  Meningococcal immunization. / 1 dose if you are age 5 to 16 years and a Orthoptist living in a  residence hall, or have one of several medical conditions, you need to get vaccinated against meningococcal disease. You may also need additional booster doses.  Varicella immunization.** / Consult your caregiver.  Hepatitis A immunization.** / Consult your caregiver. 2 doses, 6 to 18 months apart.  Hepatitis B immunization.** / Consult your caregiver. 3 doses usually over 6 months. Ages 48 to 68  Blood pressure check.** / Every 1 to 2 years.  Lipid and cholesterol check.** / Every 5 years beginning at age 38.  Fecal occult blood test (FOBT) of stool. /  Every year beginning at age 72 and continuing until age 15. You may not have to do this test if you get colonoscopy every 10 years.  Flexible sigmoidoscopy** or colonoscopy.** / Every 5 years for a flexible sigmoidoscopy or every 10 years for a colonoscopy beginning at age 70 and continuing until age 53.  Hepatitis C blood test.** / For all people born from [birth year] through 1965 and any individual with known risks for hepatitis C.  Skin self-exam. / Monthly.  Influenza immunization.** / Every year.  Pneumococcal polysaccharide immunization.** / 1 to 2 doses if you smoke cigarettes or if you have certain chronic medical conditions.  Tetanus, diphtheria, pertussis (Tdap/Td) immunization.** / A one-time dose of Tdap vaccine. After that, you need a Td booster dose every 10 years.  Measles, mumps, rubella (MMR) immunization. / You need at least 1 dose of MMR if you were born in 1957 or later. You may also need a 2nd dose.  Varicella immunization.**/ Consult your caregiver.  Meningococcal immunization.** / Consult your caregiver.  Hepatitis A immunization.** / Consult your caregiver. 2 doses, 6 to 18 months apart.  Hepatitis B immunization.** / Consult your caregiver. 3 doses, usually over 6 months. Ages 79 and over  Blood pressure check.** / Every 1 to 2 years.  Lipid and cholesterol check.**/ Every 5 years beginning at age  75.  Fecal occult blood test (FOBT) of stool. / Every year beginning at age 78 and continuing until age 10. You may not have to do this test if you get colonoscopy every 10 years.  Flexible sigmoidoscopy** or colonoscopy.** / Every 5 years for a flexible sigmoidoscopy or every 10 years for a colonoscopy beginning at age 109 and continuing until age 22.  Hepatitis C blood test.** / For all people born from [birth year] through 1963 and any individual with known risks for hepatitis C.  Abdominal aortic aneurysm (AAA) screening.** / A one-time screening for ages 63 to 59 years who are current or former smokers.  Skin self-exam. / Monthly.  Influenza immunization.** / Every year.  Pneumococcal polysaccharide immunization.** / 1 dose at age 50 (or older) if you have never been vaccinated.  Tetanus, diphtheria, pertussis (Tdap, Td) immunization. / A one-time dose of Tdap vaccine if you are over 65 and have contact with an infant, are a Research scientist (physical sciences), or simply want to be protected from whooping cough. After that, you need a Td booster dose every 10 years.  Varicella immunization. ** / Consult your caregiver.  Meningococcal immunization.** / Consult your caregiver.  Hepatitis A immunization. ** / Consult your caregiver. 2 doses, 6 to 18 months apart.  Hepatitis B immunization.** / Check with your caregiver. 3 doses, usually over 6 months. **Family history and personal history of risk and conditions may change your caregiver's recommendations. Document Released: 10/30/2001 Document Revised: 11/26/2011 Document Reviewed: 01/29/2011 [facility] Patient Information 2014 [place], [place].

## 2013-07-17 ENCOUNTER — Encounter: Payer: Self-pay | Admitting: Nurse Practitioner

## 2013-07-17 ENCOUNTER — Other Ambulatory Visit: Payer: Self-pay

## 2013-07-17 LAB — URINALYSIS, ROUTINE W REFLEX MICROSCOPIC
Bilirubin Urine: NEGATIVE
Glucose, UA: NEGATIVE mg/dL
Hgb urine dipstick: NEGATIVE
Ketones, ur: NEGATIVE mg/dL
pH: 6.5 (ref 5.0–8.0)

## 2013-07-17 NOTE — Progress Notes (Signed)
Subjective:     Nicholas Graham is a 62 y.o. male and is here for a comprehensive physical exam. He is currently treated for hyperlipidemia and GERD, by historical provider. The patient reports problems - RSD of R arm post fracture. Marland Kitchen  History   Social History  . Marital Status: Married    Spouse Name: N/A    Number of Children: N/A  . Years of Education: N/A   Occupational History  . Not on file.   Social History Main Topics  . Smoking status: Former Smoker -- 3.00 packs/day for 30 years    Quit date: 07/16/2002  . Smokeless tobacco: Never Used  . Alcohol Use: Yes     Comment: socially  . Drug Use: No  . Sexual Activity: Yes   Other Topics Concern  . Not on file   Social History Narrative  . No narrative on file   Health Maintenance  Topic Date Due  . Influenza Vaccine  07/17/2014  . Zostavax  07/17/2014  . Tetanus/tdap  07/17/2014  . Colonoscopy  07/17/2020    The following portions of the patient's history were reviewed and updated as appropriate: allergies, current medications, past family history, past medical history, past social history, past surgical history and problem list.  Review of Systems Constitutional: negative, weight gain due to decreased activity Eyes: positive for contacts/glasses Ears, nose, mouth, throat, and face: positive for nothing Respiratory: negative for cough, dyspnea on exertion, sputum, wheezing and past smoker Cardiovascular: negative for chest pain, irregular heart beat and lower extremity edema Gastrointestinal: positive for abdominal pain and no reflux symptoms while taking PPI, c/o chronic pain LLQ above area where inguinal hernia surgery was repaired. Genitourinary:negative for decreased stream Integument/breast: negative Hematologic/lymphatic: negative Musculoskeletal:positive for arthralgias and back pain Neurological: negative Behavioral/Psych: negative Endocrine: negative Allergic/Immunologic: negative   Objective:    BP 138/84  Pulse 100  Temp(Src) 99.3 F (37.4 C) (Temporal)  Resp 18  Ht 6\' 2"  (1.88 m)  Wt 242 lb (109.77 kg)  BMI 31.06 kg/m2  SpO2 98% General appearance: alert, cooperative, appears older than stated age and no distress Head: Normocephalic, without obvious abnormality, atraumatic Eyes: conjunctivae/corneas clear. PERRL, EOM's intact. Fundi benign. Ears: normal TM's and external ear canals both ears Throat: lips, mucosa, and tongue normal; teeth and gums normal Back: kyphosis Lungs: clear to auscultation bilaterally Heart: regular rate and rhythm, S1, S2 normal, no murmur, click, rub or gallop Abdomen: abnormal findings:  obese and No HSM, c/o tenderness LLQ Extremities: extremities normal, atraumatic, no cyanosis or edema Pulses: 2+ and symmetric Skin: Skin color, texture, turgor normal. No rashes or lesions Lymph nodes: Cervical, supraclavicular, and axillary nodes normal. Neurologic: Alert and oriented X 3, normal strength and tone. Normal symmetric reflexes. Normal coordination and gait    Assessment:    Preventive care: vaccines ood-pt declines all vaccines. Education & CDC recommendations given. Pt is needle phobic. Declines PSA-discussed risks & benfits. No symptoms of enlarged prostate. Labs as ordered H/o hyperlipidemia. Labs as ordered. Current tx simvastatin 20 mg. H/o GERd. No symptoms. Current Tx protonix 40 mg. Chronic Abdominal & groin discomfort since L inguinal hernia repair -worse w/initial weight bearing, gets better after on feet for a while. Obese abdomen, firm LLQ, tender BMI 32.     Plan:     review labs, Tx as needed. Monitor labs, continue med, encourage exercise, 5 servings fruits & veggies daily Cont protonix Abd [place], refer to GI if nml scan or consider PT-Wilda  at [facility]  Weight loss, gave education & goals. See After Visit Summary for Counseling Recommendations

## 2013-07-21 ENCOUNTER — Telehealth: Payer: Self-pay | Admitting: Nurse Practitioner

## 2013-07-21 ENCOUNTER — Inpatient Hospital Stay (HOSPITAL_BASED_OUTPATIENT_CLINIC_OR_DEPARTMENT_OTHER): Admission: RE | Admit: 2013-07-21 | Payer: Self-pay | Source: Ambulatory Visit

## 2013-07-21 ENCOUNTER — Ambulatory Visit (HOSPITAL_BASED_OUTPATIENT_CLINIC_OR_DEPARTMENT_OTHER)
Admission: RE | Admit: 2013-07-21 | Discharge: 2013-07-21 | Disposition: A | Discharge status: Home / Self Care | Payer: BC Managed Care – PPO | Source: Ambulatory Visit | Attending: Nurse Practitioner | Admitting: Nurse Practitioner

## 2013-07-21 DIAGNOSIS — R1032 Left lower quadrant pain: Secondary | ICD-10-CM | POA: Insufficient documentation

## 2013-07-21 DIAGNOSIS — G8929 Other chronic pain: Secondary | ICD-10-CM | POA: Insufficient documentation

## 2013-07-21 NOTE — Telephone Encounter (Signed)
abd US shows no abnm finding. I think pain is r/t cutting through muscle & soft tissue when he had inguinal surgery. Will recmd PT. Amalia Greenhouse at [facility].

## 2013-07-21 NOTE — Telephone Encounter (Signed)
He probably has muscle pain from being cut when he had surgey. I don't know that it will help, but most likely, they will teach him how to manage it & possibly some exercises that will make it better.

## 2013-07-21 NOTE — Telephone Encounter (Signed)
Patient notified of results and given info about PT. Patient had question about how PT will help him. Please advise?

## 2013-07-21 NOTE — Telephone Encounter (Signed)
Patient stated that he wants to think about going to PT for a while. Patient stated that he has been to PT before and was not happy with PT. He will let you know if he decides to go.

## 2013-07-28 ENCOUNTER — Other Ambulatory Visit (INDEPENDENT_AMBULATORY_CARE_PROVIDER_SITE_OTHER): Payer: BC Managed Care – PPO

## 2013-07-28 DIAGNOSIS — Z0000 Encounter for general adult medical examination without abnormal findings: Secondary | ICD-10-CM

## 2013-07-29 ENCOUNTER — Ambulatory Visit: Payer: BC Managed Care – PPO

## 2013-07-29 ENCOUNTER — Other Ambulatory Visit: Payer: Self-pay | Admitting: Nurse Practitioner

## 2013-07-29 DIAGNOSIS — R7989 Other specified abnormal findings of blood chemistry: Secondary | ICD-10-CM

## 2013-07-29 DIAGNOSIS — R946 Abnormal results of thyroid function studies: Secondary | ICD-10-CM

## 2013-07-29 LAB — RENAL FUNCTION PANEL
Albumin: 4.4 g/dL (ref 3.5–5.2)
Calcium: 9.4 mg/dL (ref 8.4–10.5)
Chloride: 106 mEq/L (ref 96–112)
Potassium: 3.9 mEq/L (ref 3.5–5.1)

## 2013-07-29 LAB — LIPID PANEL
Cholesterol: 154 mg/dL (ref 0–200)
HDL: 37.7 mg/dL — ABNORMAL LOW (ref >39.00)
LDL Cholesterol: 95 mg/dL (ref 0–99)
Total CHOL/HDL Ratio: 4
Triglycerides: 105 mg/dL (ref 0.0–149.0)

## 2013-07-29 LAB — HEPATIC FUNCTION PANEL
AST: 25 U/L (ref 0–37)
Albumin: 4.4 g/dL (ref 3.5–5.2)
Alkaline Phosphatase: 54 U/L (ref 39–117)
Bilirubin, Direct: 0 mg/dL (ref 0.0–0.3)
Total Protein: 7.1 g/dL (ref 6.0–8.3)

## 2013-07-29 LAB — TSH: TSH: 0.36 u[IU]/mL (ref 0.35–5.50)

## 2013-08-07 ENCOUNTER — Ambulatory Visit (HOSPITAL_BASED_OUTPATIENT_CLINIC_OR_DEPARTMENT_OTHER): Payer: BC Managed Care – PPO

## 2013-11-06 ENCOUNTER — Other Ambulatory Visit: Payer: Self-pay

## 2013-11-06 MED ORDER — PANTOPRAZOLE SODIUM 40 MG PO TBEC
40.0000 mg | DELAYED_RELEASE_TABLET | Freq: Every day | ORAL | Status: DC
Start: 2013-11-06 — End: 2013-12-04

## 2013-12-03 ENCOUNTER — Telehealth: Payer: Self-pay | Admitting: Family Medicine

## 2013-12-03 NOTE — Telephone Encounter (Signed)
Ok to refill zocor & pantoprazole.  Please make sure pt has appt in next 3 mos.

## 2013-12-03 NOTE — Telephone Encounter (Signed)
Request faxed from [facility] for simvastatin 20mg , pantoprazole 40mg , and glimepiride 4mg .  Glimepiride is not even on patient's chart.  Please advise refills.

## 2013-12-04 MED ORDER — SIMVASTATIN 20 MG PO TABS: 20.0000 mg | ORAL_TABLET | Freq: Every day | ORAL | Status: DC

## 2013-12-04 MED ORDER — PANTOPRAZOLE SODIUM 40 MG PO TBEC: 40.0000 mg | DELAYED_RELEASE_TABLET | Freq: Every day | ORAL | Status: DC

## 2013-12-04 NOTE — Telephone Encounter (Signed)
Rx sent to pharmacy. Wife notified.

## 2014-02-15 ENCOUNTER — Other Ambulatory Visit: Payer: Self-pay

## 2014-02-15 MED ORDER — SIMVASTATIN 20 MG PO TABS: 20.0000 mg | ORAL_TABLET | Freq: Every day | ORAL | Status: DC

## 2014-02-19 ENCOUNTER — Encounter: Payer: Self-pay | Admitting: Nurse Practitioner

## 2014-02-19 ENCOUNTER — Ambulatory Visit (INDEPENDENT_AMBULATORY_CARE_PROVIDER_SITE_OTHER): Payer: BC Managed Care – PPO | Admitting: Nurse Practitioner

## 2014-02-19 VITALS — BP 150/96 | HR 85 | Temp 97.8°F | Ht 74.0 in | Wt 235.0 lb

## 2014-02-19 DIAGNOSIS — R030 Elevated blood-pressure reading, without diagnosis of hypertension: Secondary | ICD-10-CM

## 2014-02-19 DIAGNOSIS — IMO0001 Reserved for inherently not codable concepts without codable children: Secondary | ICD-10-CM

## 2014-02-19 DIAGNOSIS — R1030 Lower abdominal pain, unspecified: Secondary | ICD-10-CM

## 2014-02-19 DIAGNOSIS — R109 Unspecified abdominal pain: Secondary | ICD-10-CM

## 2014-02-19 DIAGNOSIS — I10 Essential (primary) hypertension: Secondary | ICD-10-CM | POA: Insufficient documentation

## 2014-02-19 DIAGNOSIS — N200 Calculus of kidney: Secondary | ICD-10-CM | POA: Insufficient documentation

## 2014-02-19 DIAGNOSIS — R7309 Other abnormal glucose: Secondary | ICD-10-CM | POA: Insufficient documentation

## 2014-02-19 DIAGNOSIS — Z0000 Encounter for general adult medical examination without abnormal findings: Secondary | ICD-10-CM | POA: Insufficient documentation

## 2014-02-19 DIAGNOSIS — E785 Hyperlipidemia, unspecified: Secondary | ICD-10-CM | POA: Insufficient documentation

## 2014-02-19 LAB — CBC WITH DIFFERENTIAL/PLATELET
BASOS ABS: 0 10*3/uL (ref 0.0–0.1)
Basophils Relative: 0.5 % (ref 0.0–3.0)
EOS PCT: 2.1 % (ref 0.0–5.0)
Eosinophils Absolute: 0.1 10*3/uL (ref 0.0–0.7)
HEMATOCRIT: 49.2 % (ref 39.0–52.0)
HEMOGLOBIN: 16.3 g/dL (ref 13.0–17.0)
LYMPHS ABS: 1.4 10*3/uL (ref 0.7–4.0)
Lymphocytes Relative: 27.8 % (ref 12.0–46.0)
MCHC: 33.1 g/dL (ref 30.0–36.0)
MCV: 93 fl (ref 78.0–100.0)
Monocytes Absolute: 0.5 10*3/uL (ref 0.1–1.0)
Monocytes Relative: 10 % (ref 3.0–12.0)
Neutro Abs: 3 10*3/uL (ref 1.4–7.7)
Neutrophils Relative %: 59.6 % (ref 43.0–77.0)
Platelets: 153 10*3/uL (ref 150.0–400.0)
RBC: 5.29 Mil/uL (ref 4.22–5.81)
RDW: 13.4 % (ref 11.5–15.5)
WBC: 5 10*3/uL (ref 4.0–10.5)

## 2014-02-19 LAB — COMPREHENSIVE METABOLIC PANEL
ALT: 22 U/L (ref 0–53)
AST: 22 U/L (ref 0–37)
Albumin: 4.2 g/dL (ref 3.5–5.2)
Alkaline Phosphatase: 64 U/L (ref 39–117)
BILIRUBIN TOTAL: 0.3 mg/dL (ref 0.2–1.2)
BUN: 15 mg/dL (ref 6–23)
CO2: 26 meq/L (ref 19–32)
CREATININE: 0.9 mg/dL (ref 0.4–1.5)
Calcium: 9.6 mg/dL (ref 8.4–10.5)
Chloride: 108 mEq/L (ref 96–112)
GFR: 96.22 mL/min (ref >60.00)
Glucose, Bld: 108 mg/dL — ABNORMAL HIGH (ref 70–99)
Potassium: 4.4 mEq/L (ref 3.5–5.1)
Sodium: 141 mEq/L (ref 135–145)
Total Protein: 6.6 g/dL (ref 6.0–8.3)

## 2014-02-19 LAB — LIPID PANEL
Cholesterol: 162 mg/dL (ref 0–200)
HDL: 39.4 mg/dL (ref >39.00)
LDL Cholesterol: 107 mg/dL — ABNORMAL HIGH (ref 0–99)
NONHDL: 122.6
TRIGLYCERIDES: 76 mg/dL (ref 0.0–149.0)
Total CHOL/HDL Ratio: 4
VLDL: 15.2 mg/dL (ref 0.0–40.0)

## 2014-02-19 LAB — HEMOGLOBIN A1C: Hgb A1c MFr Bld: 5.8 % (ref 4.6–6.5)

## 2014-02-19 LAB — URINALYSIS, MICROSCOPIC ONLY: WBC, UA: NONE SEEN (ref 0–?)

## 2014-02-19 MED ORDER — SIMVASTATIN 20 MG PO TABS: ORAL_TABLET | ORAL | Status: DC

## 2014-02-19 NOTE — Assessment & Plan Note (Signed)
Needs Tdap. Refused today. Discussed risks & s&s of tetanus. Needlephobic.

## 2014-02-19 NOTE — Progress Notes (Signed)
Pre visit review using our clinic review tool, if applicable. No additional management support is needed unless otherwise documented below in the visit note. 

## 2014-02-19 NOTE — Assessment & Plan Note (Signed)
Encourage daily exercise, diet changes. F/u 6 wks.

## 2014-02-19 NOTE — Assessment & Plan Note (Signed)
Current meds: zocor 20 mg qd. Decrease to qod. Lipids today. Increase exercise & cut out refined flour & sugar. Increase fiber from whole foods & grains.

## 2014-02-19 NOTE — Assessment & Plan Note (Signed)
C/o pain between umbilicus & bladder. Notices in am after going to bathroom. Reports nocturia:2/night. Tender on exam. No specific mass palpated, but was guarded. Urine studies. CMET CBC diff Discussed complete abd -pt wants to wait until labs result.

## 2014-02-19 NOTE — Progress Notes (Signed)
Subjective:     Nicholas Graham is a 63 y.o. male presents for follow up of dyslipidemia. He also c/o low abdominal pain below navel. Pain mostly occurs early morning after urinating. Self-limiting. Started about 2 mos ago. Reports nocturia- twice/night. Denies fever, diarrhea, constipation, heartburn, nausea, change in appetite. Tolerating zocor without SE. Eating a lot of refined flour. Counseled to cut back refined flour & sugar & increase fiber from whole foods & grains-list w/fiber grams count given. BP elevated in ofc today.  The following portions of the patient's history were reviewed and updated as appropriate: allergies, current medications, past family history, past medical history, past social history, past surgical history and problem list.  Review of Systems Pertinent items are noted in HPI.    Objective:    BP 150/96  Pulse 85  Temp(Src) 97.8 F (36.6 C) (Temporal)  Ht 6\' 2"  (1.88 m)  Wt 235 lb (106.595 kg)  BMI 30.16 kg/m2  SpO2 98% BP 150/96  Pulse 85  Temp(Src) 97.8 F (36.6 C) (Temporal)  Ht 6\' 2"  (1.88 m)  Wt 235 lb (106.595 kg)  BMI 30.16 kg/m2  SpO2 98% General appearance: alert, cooperative, appears stated age and no distress Head: Normocephalic, without obvious abnormality, atraumatic Eyes: negative findings: lids and lashes normal and conjunctivae and sclerae normal Lungs: clear to auscultation bilaterally Heart: regular rate and rhythm, S1, S2 normal, no murmur, click, rub or gallop Abdomen: No HSM. Tender below navel, guarding w/palpation-difficult to palpate. Extremities: no edema, redness or tenderness in the calves or thighs and LLE has flat erythematous irregularly shaped lesion w/.78mm blood clot in center-removed w/edge of needle.Pt states he thinks he had a mosquito bite.    Assessment:  1. Dyslipidemia - Lipid panel - simvastatin (ZOCOR) 20 MG tablet; Take 1T po QOD.  Dispense: 30 tablet; Refill: 2  2. Elevated hemoglobin A1c - Hemoglobin  A1c  3. Elevated blood pressure - Comprehensive metabolic panel  4. Abdominal pain, lower - Urine culture - POCT urinalysis dipstick - Urine Microscopic  5. Preventative health care - Hepatitis C antibody Needs Tdap-pt declined  See problem list for complete A&P

## 2014-02-19 NOTE — Addendum Note (Signed)
Addended by: Jannette Spanner on: 02/19/2014 03:04 PM   Modules accepted: Orders

## 2014-02-19 NOTE — Assessment & Plan Note (Signed)
A1C 5.8.  Discussed increased risk for developing diabetes. Counseled exercise & diet changes: more fiber from whole food & grains, less refined flour & sugar. A1C today.

## 2014-02-19 NOTE — Patient Instructions (Signed)
Take cholesterol medicine every other day.  You are at risk for developing diabetes and your blood pressure is a bit high. Cut out white bread, cut back on pasta & white rice. Eat whole wheat bread & brown rice instead. Eat fruits & vegetables every day. Try to get 50 grams fiber daily from fruits, vegetables, & whole grains. This will decrease risk for diabetes, lower blood pressure, and you will lose a few pounds.  Walk 30 minutes every day.  If all labs are normal, plan on abdominal ultrasound next week.  You need a tetanus vaccine.  See you in 6 weeks for blood pressure & weight check.

## 2014-02-23 ENCOUNTER — Telehealth: Payer: Self-pay | Admitting: Nurse Practitioner

## 2014-02-23 ENCOUNTER — Other Ambulatory Visit (INDEPENDENT_AMBULATORY_CARE_PROVIDER_SITE_OTHER): Payer: BC Managed Care – PPO

## 2014-02-23 DIAGNOSIS — R102 Pelvic and perineal pain: Secondary | ICD-10-CM

## 2014-02-23 DIAGNOSIS — R319 Hematuria, unspecified: Secondary | ICD-10-CM

## 2014-02-23 NOTE — Telephone Encounter (Signed)
Patient will get [place] 02/25/2014 at [facility] in [place].

## 2014-02-23 NOTE — Telephone Encounter (Signed)
pls call pt: Advise Labs good, but he has some blood in urine.  I have ordered abd US-please ask where he wants to do it-I pended order. He should come in for lab visit for another urine sample. If blood still there, I will refer to urology for further work up.

## 2014-02-23 NOTE — Telephone Encounter (Signed)
Pt's wife returned call for results. Patient will go to [facility] in [place]. Mailed copy of results to home address.

## 2014-02-23 NOTE — Telephone Encounter (Signed)
Spoke to pt's wife who stated that she call me back later to get pt's results.

## 2014-02-23 NOTE — Telephone Encounter (Signed)
Patient wanted to make sure 02/25/14 [place] is going to be ordered differently than the last one because "the last one didn't show anything".

## 2014-02-23 NOTE — Telephone Encounter (Signed)
Please advise 

## 2014-02-23 NOTE — Telephone Encounter (Signed)
Patient's wife was checking to see if patient is supposed to have an ultrasound. He also would like to know if we have his results. Please contact her this afternoon after 3:15PM.

## 2014-02-24 LAB — URINE CULTURE
Colony Count: NO GROWTH
ORGANISM ID, BACTERIA: NO GROWTH

## 2014-02-24 LAB — URINALYSIS, MICROSCOPIC ONLY

## 2014-02-24 NOTE — Telephone Encounter (Signed)
Answered qtns about abd [place].

## 2014-02-25 ENCOUNTER — Telehealth: Payer: Self-pay | Admitting: Nurse Practitioner

## 2014-02-25 ENCOUNTER — Ambulatory Visit (INDEPENDENT_AMBULATORY_CARE_PROVIDER_SITE_OTHER): Payer: BC Managed Care – PPO

## 2014-02-25 DIAGNOSIS — N200 Calculus of kidney: Secondary | ICD-10-CM

## 2014-02-25 DIAGNOSIS — R102 Pelvic and perineal pain: Secondary | ICD-10-CM

## 2014-02-25 NOTE — Telephone Encounter (Signed)
pls call pt: Advise No blood in urine on 2nd test. Proceed with abd [place].

## 2014-02-25 NOTE — Telephone Encounter (Signed)
&   mm stone L kidney. 4 MM stone R kidney.  Likely cause of pain associated w/urination & Transient hematuria. Will refer to urology. Pt request provider in [place].

## 2014-02-25 NOTE — Telephone Encounter (Signed)
Patient is aware of results.

## 2014-02-25 NOTE — Telephone Encounter (Signed)
Called both home and work numbers, no answer. Will call again later.

## 2014-03-15 ENCOUNTER — Encounter: Payer: Self-pay | Admitting: Nurse Practitioner

## 2014-04-02 ENCOUNTER — Encounter: Payer: Self-pay | Admitting: Nurse Practitioner

## 2014-04-02 ENCOUNTER — Ambulatory Visit (INDEPENDENT_AMBULATORY_CARE_PROVIDER_SITE_OTHER): Payer: BC Managed Care – PPO | Admitting: Nurse Practitioner

## 2014-04-02 VITALS — BP 141/85 | HR 76 | Temp 97.4°F | Ht 74.0 in | Wt 227.0 lb

## 2014-04-02 DIAGNOSIS — E785 Hyperlipidemia, unspecified: Secondary | ICD-10-CM

## 2014-04-02 DIAGNOSIS — I10 Essential (primary) hypertension: Secondary | ICD-10-CM

## 2014-04-02 MED ORDER — SIMVASTATIN 10 MG PO TABS: 10.0000 mg | ORAL_TABLET | Freq: Every day | ORAL | Status: DC

## 2014-04-02 NOTE — Progress Notes (Signed)
Pre visit review using our clinic review tool, if applicable. No additional management support is needed unless otherwise documented below in the visit note. 

## 2014-04-02 NOTE — Progress Notes (Signed)
Subjective:    Patient here for follow-up of elevated blood pressure.  He is exercising and is adherent to a low-salt diet.  Blood pressure is well controlled at home. Cardiac symptoms: none. Patient denies: chest pain, irregular heart beat and lower extremity edema. Cardiovascular risk factors: advanced age (older than 80 for men, 20 for women), dyslipidemia and male gender. Use of agents associated with hypertension: none. History of target organ damage: none. Since last visit, Nicholas Graham started walking daily and eating whole grains, drinking light beer. He has lost 8 pounds, BP has dropped 9 points.  He is concerned about a lesion on back of R leg, been there for several months.  The following portions of the patient's history were reviewed and updated as appropriate: allergies, current medications, past medical history, past social history, past surgical history and problem list.  Review of Systems Pertinent items are noted in HPI.     Objective:    BP 141/85  Pulse 76  Temp(Src) 97.4 F (36.3 C) (Temporal)  Ht 6\' 2"  (1.88 m)  Wt 227 lb (102.967 kg)  BMI 29.13 kg/m2  SpO2 98% General appearance: alert, cooperative, appears stated age and no distress Head: Normocephalic, without obvious abnormality, atraumatic Eyes: negative findings: lids and lashes normal and conjunctivae and sclerae normal Extremities: extremities normal, atraumatic, no cyanosis or edema Skin: 3 mm nodular pink round lesion back of R thigh. No visible telangiectasia.    Assessment & Plan:    Hypertension, normal blood pressure continue exercise & diet changes.  Monitor skin lesion, pt declined derm referral today. Declines Tdap. F/u 5 mos.

## 2014-04-02 NOTE — Patient Instructions (Signed)
Continue with exercise, you are doing yourself huge favors! You avoided blood pressure medicine with 8 pound weight loss! See you in December to recheck cholesterol & skin lesion.

## 2014-05-18 ENCOUNTER — Other Ambulatory Visit: Payer: Self-pay

## 2014-05-18 MED ORDER — PANTOPRAZOLE SODIUM 40 MG PO TBEC: 40.0000 mg | DELAYED_RELEASE_TABLET | Freq: Every day | ORAL | Status: DC

## 2014-08-17 ENCOUNTER — Telehealth: Payer: Self-pay | Admitting: Nurse Practitioner

## 2014-08-17 NOTE — Telephone Encounter (Signed)
Wants to know why he is coming back on Dec 17? Does he need to do blood work? He wants to change his appt if he needs blood work. Please advise.

## 2014-08-18 NOTE — Telephone Encounter (Signed)
Spoke with pt's wife and she would like to know exactly what pt is being seen for. I explained to her per your last note that he has a follow up for his cholesterol. His appt is at 4pm and he would like to come early in the morning if he has to do bloodwork. Please clarify and tell me what I should tell pt. Thanks

## 2014-08-18 NOTE — Telephone Encounter (Signed)
Spoke with pt, advised message from [place]. We changed appt to an am appt.

## 2014-08-18 NOTE — Telephone Encounter (Signed)
He does need a fasting appt., so pls change to am appt. I want to check cholesterol because I decreased his dose to qod at last OV. Also want to keep eye on BP -it was elevated last 2 visits, although improved after he lost weight. Tell Ms neville I said "Hello" :)

## 2014-09-02 ENCOUNTER — Ambulatory Visit: Payer: BC Managed Care – PPO | Admitting: Nurse Practitioner

## 2014-09-03 ENCOUNTER — Telehealth: Payer: Self-pay | Admitting: Nurse Practitioner

## 2014-09-03 ENCOUNTER — Ambulatory Visit (INDEPENDENT_AMBULATORY_CARE_PROVIDER_SITE_OTHER): Payer: BC Managed Care – PPO

## 2014-09-03 ENCOUNTER — Ambulatory Visit (INDEPENDENT_AMBULATORY_CARE_PROVIDER_SITE_OTHER): Payer: BC Managed Care – PPO | Admitting: Nurse Practitioner

## 2014-09-03 ENCOUNTER — Other Ambulatory Visit: Payer: Self-pay | Admitting: Nurse Practitioner

## 2014-09-03 ENCOUNTER — Encounter: Payer: Self-pay | Admitting: Nurse Practitioner

## 2014-09-03 VITALS — BP 143/79 | HR 82 | Temp 97.9°F | Resp 18 | Ht 74.0 in | Wt 211.0 lb

## 2014-09-03 DIAGNOSIS — IMO0001 Reserved for inherently not codable concepts without codable children: Secondary | ICD-10-CM

## 2014-09-03 DIAGNOSIS — M25471 Effusion, right ankle: Secondary | ICD-10-CM

## 2014-09-03 DIAGNOSIS — Z789 Other specified health status: Secondary | ICD-10-CM

## 2014-09-03 DIAGNOSIS — Z7289 Other problems related to lifestyle: Secondary | ICD-10-CM

## 2014-09-03 DIAGNOSIS — R7309 Other abnormal glucose: Secondary | ICD-10-CM

## 2014-09-03 DIAGNOSIS — M2550 Pain in unspecified joint: Secondary | ICD-10-CM

## 2014-09-03 DIAGNOSIS — R030 Elevated blood-pressure reading, without diagnosis of hypertension: Secondary | ICD-10-CM

## 2014-09-03 DIAGNOSIS — E785 Hyperlipidemia, unspecified: Secondary | ICD-10-CM

## 2014-09-03 LAB — COMPREHENSIVE METABOLIC PANEL
ALT: 17 U/L (ref 0–53)
AST: 20 U/L (ref 0–37)
Albumin: 4.4 g/dL (ref 3.5–5.2)
Alkaline Phosphatase: 70 U/L (ref 39–117)
BILIRUBIN TOTAL: 1 mg/dL (ref 0.2–1.2)
BUN: 15 mg/dL (ref 6–23)
CO2: 23 mEq/L (ref 19–32)
CREATININE: 0.9 mg/dL (ref 0.4–1.5)
Calcium: 9.4 mg/dL (ref 8.4–10.5)
Chloride: 103 mEq/L (ref 96–112)
GFR: 91.14 mL/min (ref >60.00)
Glucose, Bld: 110 mg/dL — ABNORMAL HIGH (ref 70–99)
Potassium: 4.3 mEq/L (ref 3.5–5.1)
Sodium: 137 mEq/L (ref 135–145)
Total Protein: 6.9 g/dL (ref 6.0–8.3)

## 2014-09-03 LAB — MICROALBUMIN / CREATININE URINE RATIO
Creatinine,U: 28.5 mg/dL
MICROALB UR: 0.5 mg/dL (ref 0.0–1.9)
Microalb Creat Ratio: 1.8 mg/g (ref 0.0–30.0)

## 2014-09-03 LAB — CBC
HCT: 48.7 % (ref 39.0–52.0)
Hemoglobin: 16.2 g/dL (ref 13.0–17.0)
MCHC: 33.3 g/dL (ref 30.0–36.0)
MCV: 91.2 fl (ref 78.0–100.0)
PLATELETS: 201 10*3/uL (ref 150.0–400.0)
RBC: 5.34 Mil/uL (ref 4.22–5.81)
RDW: 13.4 % (ref 11.5–15.5)
WBC: 6.2 10*3/uL (ref 4.0–10.5)

## 2014-09-03 LAB — LIPID PANEL
Cholesterol: 140 mg/dL (ref 0–200)
HDL: 34.3 mg/dL — ABNORMAL LOW (ref >39.00)
LDL CALC: 89 mg/dL (ref 0–99)
NONHDL: 105.7
Total CHOL/HDL Ratio: 4
Triglycerides: 82 mg/dL (ref 0.0–149.0)
VLDL: 16.4 mg/dL (ref 0.0–40.0)

## 2014-09-03 LAB — URIC ACID: URIC ACID, SERUM: 5.9 mg/dL (ref 4.0–7.8)

## 2014-09-03 LAB — HEMOGLOBIN A1C: Hgb A1c MFr Bld: 5.8 % (ref 4.6–6.5)

## 2014-09-03 LAB — SEDIMENTATION RATE: SED RATE: 8 mm/h (ref 0–22)

## 2014-09-03 MED ORDER — PREDNISONE 5 MG PO TABS: ORAL_TABLET | ORAL | Status: DC

## 2014-09-03 NOTE — Telephone Encounter (Signed)
Left message for pt to call back  °

## 2014-09-03 NOTE — Patient Instructions (Signed)
Get Xray for ankle. Start prednisone for ankle pain. Do not take ibuprophen while taking prednisone. You may take 1000 mg tylenol twice daily. Rub aspercream on ankle three times daily followed by 10 minutes heat (heating pad).  My office will call with lab results.  GREAT JOB with weight loss! Keep up good work.  Refer to list of foods that have purine. Avoids foods that are high in purine.  Come back in 2 weeks.  Merry Christmas!

## 2014-09-03 NOTE — Telephone Encounter (Signed)
pls call pt: Advise Xray findings indicate this may be gout. Labs will help me make diagnosis. Continue with instructions given in office.

## 2014-09-03 NOTE — Progress Notes (Signed)
Pre visit review using our clinic review tool, if applicable. No additional management support is needed unless otherwise documented below in the visit note. 

## 2014-09-03 NOTE — Progress Notes (Signed)
Subjective:     Nicholas Graham is a 63 y.o. male presents for follow up of chronic conditions: elevated blood pressure, dyslipidemia, elevated A1C. He has new problem of painful nodule on R ankle. He has lost 30 lbs in 14  mos-intentional with regular exercise-walking at park & making diet changes: switched to whole grains & drinking "light beer"-12 to 18 cans on weekends.  Re elevated BP: he is not on meds. Goal is <140/90. Slightly elevated last few OV. Check microalbumin today. Re dyslipidemia: this is long-standing. He has been on statin for several yrs; drinks alcohol heavily on weekends-reports 12 to 18 beers; increased exercise & diet changes will be beneficial. NO intolerable SE to meds. Last CMET nml. Elevated A1C: 5.8. Wt loss, Exercise & diet changes should normalize or stabilize. Encourage to continue. Will monitor. Nodule on ankle: duration-2-3  Mos. Moderate pain, wearing shoe painful. 400 mg ibuprophen BID gives minimal relief. Hurts more w/weight bearing.     The following portions of the patient's history were reviewed and updated as appropriate: allergies, current medications, past medical history, past social history, past surgical history and problem list.  Review of Systems Constitutional: negative for fatigue and fevers Ears, nose, mouth, throat, and face: negative for sore throat Respiratory: negative for cough Cardiovascular: negative for chest pressure/discomfort and palpitations Gastrointestinal: negative for change in bowel habits, constipation, diarrhea and reflux symptoms Integument/breast: positive for skin color change and skin over nodule is red, negative for rash Musculoskeletal:positive for arthralgias and knee pain Behavioral/Psych: negative for sleep disturbance and tobacco use Endocrine: negative for diabetic symptoms including blurry vision, polydipsia, polyphagia and polyuria    Objective:    BP 143/79 mmHg  Pulse 82  Temp(Src) 97.9 F (36.6 C)  (Oral)  Resp 18  Ht 6\' 2"  (1.88 m)  Wt 211 lb (95.709 kg)  BMI 27.08 kg/m2  SpO2 98% BP 143/79 mmHg  Pulse 82  Temp(Src) 97.9 F (36.6 C) (Oral)  Resp 18  Ht 6\' 2"  (1.88 m)  Wt 211 lb (95.709 kg)  BMI 27.08 kg/m2  SpO2 98% General appearance: alert, cooperative, appears stated age and no distress Head: Normocephalic, without obvious abnormality, atraumatic Eyes: negative findings: lids and lashes normal and conjunctivae and sclerae normal Lungs: clear to auscultation bilaterally Heart: regular rate and rhythm, S1, S2 normal, no murmur, click, rub or gallop and no carotid bruit Abdomen: soft, non-tender; bowel sounds normal; no masses,  no organomegaly Extremities: hard discreet nodule over R ankle/proximal foot, tender to palpate, erythematous, no warmth over nodule, pulse +2, toes warm. Nodule is at medial aspect of proximal foot, just below medial malleolus  R knee-no effusion or warmth, no joint instability L foot-normal, pulse +2, warm toes Pulses: 2+ and symmetric Skin: Skin color, texture, turgor normal. No rashes or lesions or see extremity    Assessment:Plan   1. Elevated blood pressure Continue to monitor Lifestyle interventions - Hemoglobin A1c - Comprehensive metabolic panel - CBC - Lipid panel - TSH - Microalbumin / creatinine urine ratio  2. Swollen R ankle, new DD: gout, RA, psoriatic arthritis Explained patho of gout. Cut back beer consumption. - Uric acid - Sedimentation rate - Rheumatoid factor - Cyclic citrul peptide antibody, IgG - Antinuclear Antibodies, IFA - DG Ankle Complete Right; Future - predniSONE (DELTASONE) 5 MG tablet; Take 3T PO QAM X 5d, then decrease to 2T PO QAM .  Dispense: 35 tablet; Refill: 0  3. Polyarthralgia, New R knee, R ankle/foot DD: gout, RA,  psoriatic arthritis - Uric acid - Sedimentation rate - Rheumatoid factor - Cyclic citrul peptide antibody, IgG - Antinuclear Antibodies, IFA  4. Dyslipidemia, Chronic,  stable Continue meds, monitor  5. Elevated A1C, New Diet & lifestyle Monitor  6. Alcohol consumption-more than 4 drinks/week, NEW   Spent 25 minutes w/pt. See problem list for complete A&P See pt instructions. F/u 3 weeks

## 2014-09-04 LAB — RHEUMATOID FACTOR

## 2014-09-06 LAB — CYCLIC CITRUL PEPTIDE ANTIBODY, IGG: Cyclic Citrullin Peptide Ab: <2 U/mL (ref 0.0–5.0)

## 2014-09-06 NOTE — Telephone Encounter (Signed)
Spoke with pt's wife, advised results.

## 2014-09-07 LAB — TSH: TSH: 1.706 u[IU]/mL (ref 0.350–4.500)

## 2014-09-07 LAB — VITAMIN D 25 HYDROXY (VIT D DEFICIENCY, FRACTURES): VIT D 25 HYDROXY: 48 ng/mL (ref 30–100)

## 2014-09-08 ENCOUNTER — Other Ambulatory Visit: Payer: Self-pay | Admitting: Nurse Practitioner

## 2014-09-08 ENCOUNTER — Telehealth: Payer: Self-pay | Admitting: Nurse Practitioner

## 2014-09-08 DIAGNOSIS — M25579 Pain in unspecified ankle and joints of unspecified foot: Secondary | ICD-10-CM

## 2014-09-08 LAB — ANTINUCLEAR ANTIBODIES, IFA

## 2014-09-08 MED ORDER — HYDROCODONE-ACETAMINOPHEN 5-325 MG PO TABS: 1.0000 | ORAL_TABLET | Freq: Two times a day (BID) | ORAL | Status: DC | PRN

## 2014-09-08 NOTE — Progress Notes (Signed)
Patient's wife informed that rx is ready.

## 2014-09-08 NOTE — Telephone Encounter (Signed)
Start celebrex. Continue prednisone. Ask him to look at active & inactive ingredients for aspercream : it should not have capsaicin. If it does, it will cause burning sensation on skin.

## 2014-09-08 NOTE — Telephone Encounter (Signed)
Lab w/u reveals:nml sed rate, uric acid under 6, neg RF & aCCP, ANA pending CBC & CMET nml, no elevation of microalb, A1C stable 5.8  Gout? See how nodule responds to prednisone at f/u. If sz & pain better, start allopurinol & check uric acid again in 6 weeks. If no imprvmt: ref to ortho

## 2014-09-08 NOTE — Telephone Encounter (Signed)
Patient's wife notified of results. Patient's foot is more painful. Aspercreme burns the skin, so pt is not using cream. Patient wants a medication for foot pain. Please advise?

## 2014-09-10 ENCOUNTER — Encounter: Payer: Self-pay | Admitting: Nurse Practitioner

## 2014-09-10 DIAGNOSIS — Z789 Other specified health status: Secondary | ICD-10-CM | POA: Insufficient documentation

## 2014-09-10 DIAGNOSIS — M2550 Pain in unspecified joint: Secondary | ICD-10-CM | POA: Insufficient documentation

## 2014-09-10 DIAGNOSIS — M25471 Effusion, right ankle: Secondary | ICD-10-CM | POA: Insufficient documentation

## 2014-09-10 NOTE — Assessment & Plan Note (Signed)
Chronic, improved 30 lb weight loss in 14 mos: intentional-exercise, "light beer", switched to whole grains  Current med zocor 10 mg qd Cut back to qod. monitor in 6  mos.

## 2014-09-10 NOTE — Assessment & Plan Note (Addendum)
Discussed this contributes to elvated A1C, dyslipidemia, possibly to nodule on ankle if it is gout CMET today

## 2014-09-10 NOTE — Assessment & Plan Note (Signed)
Positive changes made: increased exercise, eating whole grains RF: binge drinking on weekends Continue to  monitor

## 2014-09-15 ENCOUNTER — Telehealth: Payer: Self-pay | Admitting: Nurse Practitioner

## 2014-09-15 NOTE — Telephone Encounter (Signed)
error 

## 2014-09-17 DIAGNOSIS — R079 Chest pain, unspecified: Secondary | ICD-10-CM

## 2014-09-17 HISTORY — DX: Chest pain, unspecified: R07.9

## 2014-09-24 ENCOUNTER — Ambulatory Visit (INDEPENDENT_AMBULATORY_CARE_PROVIDER_SITE_OTHER): Payer: BLUE CROSS/BLUE SHIELD | Admitting: Nurse Practitioner

## 2014-09-24 ENCOUNTER — Encounter: Payer: Self-pay | Admitting: Nurse Practitioner

## 2014-09-24 VITALS — BP 136/78 | HR 77 | Temp 98.0°F | Ht 74.0 in | Wt 215.0 lb

## 2014-09-24 DIAGNOSIS — M79671 Pain in right foot: Secondary | ICD-10-CM

## 2014-09-24 MED ORDER — TRAMADOL HCL 50 MG PO TABS: 50.0000 mg | ORAL_TABLET | Freq: Three times a day (TID) | ORAL | Status: DC | PRN

## 2014-09-24 NOTE — Progress Notes (Signed)
Pre visit review using our clinic review tool, if applicable. No additional management support is needed unless otherwise documented below in the visit note. 

## 2014-09-24 NOTE — Patient Instructions (Addendum)
Take 400 to 600 mg ibuprophen twice daily with food. Caution: ibuprophen can cause stomach irritation.  Take tramadol if needed for breakthrough pain.  Continue with aspercream & heat.  Natural anti-inflammatories can be used as well: Fresh ginger tea daily (grate 1-2 TBLS fresh ginger in & steep in hot water for 5 minutes); handful tart cherries or 1/2 cup tart cherry juice daily; 2-3 curmarin capsules daily. Start 1 supplement at a time & take for 2 weeks before adding a second or third, if needed.  See orthopedist.

## 2014-09-24 NOTE — Progress Notes (Signed)
Subjective:    Nicholas Graham is a 64 y.o. male who presents with right foot pain. He was seen 3 weeks ago w/painful nodule medial r foot. Xray showed soft tissue swelling, no bony abnormality of ankle. Findings do not correlate clinically-nodule is hard & feels like bone. I suspected gout, but prednisone & hydrocodone were not helpful. Uric acid was 5.9, sed rate was nml, RA, aCCP, ANA neg. Foot does not hurt in am, but becomes painful after standing for 3-4 hours. Pain worse at night. Used wife' tramadol-helped w/pain. Tylenol has not helped. He has not tried NSAIDS.   The following portions of the patient's history were reviewed and updated as appropriate: allergies, current medications, past medical history, past social history, past surgical history and problem list.  Review of Systems Constitutional: negative for fevers Musculoskeletal:positive for erythema over nodule, burning sensation top of foot distal to nodule.    Objective:    BP 136/78 mmHg  Pulse 77  Temp(Src) 98 F (36.7 C) (Temporal)  Ht 6\' 2"  (1.88 m)  Wt 215 lb (97.523 kg)  BMI 27.59 kg/m2  SpO2 97% Right foot:  red nodule medial foot distal to medial malleolus, hard-feels like bone, tender to palpation, foot warm, pedal pulse +2  Left foot:  not examined   Imaging: X-ray of the right foot: Medial soft tissue swelling without acute osseous abnormality.    Assessment:Plan   1. Foot pain, right DD: gout, tophi, CCP - Ambulatory referral to Orthopedics - traMADol (ULTRAM) 50 MG tablet; Take 1 tablet (50 mg total) by mouth every 8 (eight) hours as needed.  Dispense: 30 tablet; Refill: 0 ibuprophen-see pt instructions Nat anti-inflamms-see pt instructions  F/u PRN

## 2014-10-14 ENCOUNTER — Other Ambulatory Visit: Payer: Self-pay | Admitting: *Deleted

## 2014-10-14 MED ORDER — PANTOPRAZOLE SODIUM 40 MG PO TBEC
40.0000 mg | DELAYED_RELEASE_TABLET | Freq: Every day | ORAL | Status: DC
Start: 2014-10-14 — End: 2015-12-13

## 2014-11-15 ENCOUNTER — Telehealth: Payer: Self-pay | Admitting: Nurse Practitioner

## 2014-11-15 NOTE — Telephone Encounter (Signed)
Pt. Wife called to let Layne know that Pranish had his appt. With Dr. Koleen Nimrod. Juliann Pulse also wanted to know why we did not forward x-rays to Dr. Koleen Nimrod and if we can go ahead and send them to Dr. Koleen Nimrod as well as any records.

## 2014-11-15 NOTE — Telephone Encounter (Signed)
Of course xrays can be forwarded. I don't know why xrays weren't sent when referral was processed. Also don't understand why ortho cannot access [facility] xrays. Most of them can.

## 2014-11-16 NOTE — Telephone Encounter (Signed)
TY! Please call Ms Welcher & let her know info was sent, otherwise she will be calling AGAIN.

## 2014-11-16 NOTE — Telephone Encounter (Signed)
Pt. Has been contacted that the information was sent to Dr. Koleen Nimrod' s office.

## 2014-11-16 NOTE — Telephone Encounter (Signed)
Noted TY

## 2014-11-16 NOTE — Telephone Encounter (Signed)
I have gone in manually in [facility] & faxed a copy of the xray report, most recent labs, last OV note.

## 2014-11-29 ENCOUNTER — Other Ambulatory Visit: Payer: Self-pay

## 2014-11-29 DIAGNOSIS — E785 Hyperlipidemia, unspecified: Secondary | ICD-10-CM

## 2014-11-29 MED ORDER — SIMVASTATIN 10 MG PO TABS: 10.0000 mg | ORAL_TABLET | ORAL | Status: DC

## 2014-11-29 NOTE — Telephone Encounter (Signed)
Please Advise Refill Request? Refill request for- Simvastatin 10mg  Tab Last filled by MD on - 10/14/14 Last Appt - 09/24/14        Next Appt - [place]

## 2015-05-01 ENCOUNTER — Encounter: Payer: Self-pay | Admitting: Family Medicine

## 2015-06-01 ENCOUNTER — Encounter: Payer: Self-pay | Admitting: Gastroenterology

## 2015-06-07 ENCOUNTER — Other Ambulatory Visit: Payer: Self-pay | Admitting: Family Medicine

## 2015-06-07 DIAGNOSIS — E785 Hyperlipidemia, unspecified: Secondary | ICD-10-CM

## 2015-06-07 MED ORDER — SIMVASTATIN 10 MG PO TABS: 10.0000 mg | ORAL_TABLET | ORAL | Status: DC

## 2015-10-31 ENCOUNTER — Other Ambulatory Visit: Payer: Self-pay | Admitting: Family Medicine

## 2015-10-31 ENCOUNTER — Encounter: Payer: Self-pay | Admitting: Family Medicine

## 2015-10-31 ENCOUNTER — Ambulatory Visit (INDEPENDENT_AMBULATORY_CARE_PROVIDER_SITE_OTHER): Payer: BLUE CROSS/BLUE SHIELD | Admitting: Family Medicine

## 2015-10-31 VITALS — BP 155/92 | HR 93 | Temp 97.8°F | Resp 20 | Wt 230.2 lb

## 2015-10-31 DIAGNOSIS — R030 Elevated blood-pressure reading, without diagnosis of hypertension: Secondary | ICD-10-CM

## 2015-10-31 DIAGNOSIS — E785 Hyperlipidemia, unspecified: Secondary | ICD-10-CM | POA: Diagnosis not present

## 2015-10-31 DIAGNOSIS — IMO0001 Reserved for inherently not codable concepts without codable children: Secondary | ICD-10-CM

## 2015-10-31 DIAGNOSIS — Z86010 Personal history of colonic polyps: Secondary | ICD-10-CM

## 2015-10-31 DIAGNOSIS — R7309 Other abnormal glucose: Secondary | ICD-10-CM | POA: Diagnosis not present

## 2015-10-31 LAB — COMPREHENSIVE METABOLIC PANEL
ALBUMIN: 4.7 g/dL (ref 3.5–5.2)
ALT: 15 U/L (ref 0–53)
AST: 18 U/L (ref 0–37)
Alkaline Phosphatase: 65 U/L (ref 39–117)
BUN: 16 mg/dL (ref 6–23)
CALCIUM: 9.4 mg/dL (ref 8.4–10.5)
CHLORIDE: 108 meq/L (ref 96–112)
CO2: 26 mEq/L (ref 19–32)
CREATININE: 0.99 mg/dL (ref 0.40–1.50)
GFR: 81.33 mL/min (ref >60.00)
Glucose, Bld: 112 mg/dL — ABNORMAL HIGH (ref 70–99)
Potassium: 4.3 mEq/L (ref 3.5–5.1)
Sodium: 141 mEq/L (ref 135–145)
Total Bilirubin: 0.7 mg/dL (ref 0.2–1.2)
Total Protein: 7.1 g/dL (ref 6.0–8.3)

## 2015-10-31 LAB — HEMOGLOBIN A1C: HEMOGLOBIN A1C: 5.6 % (ref 4.6–6.5)

## 2015-10-31 LAB — CBC WITH DIFFERENTIAL/PLATELET
BASOS PCT: 0.4 % (ref 0.0–3.0)
Basophils Absolute: 0 10*3/uL (ref 0.0–0.1)
EOS PCT: 1.7 % (ref 0.0–5.0)
Eosinophils Absolute: 0.1 10*3/uL (ref 0.0–0.7)
HEMATOCRIT: 48.3 % (ref 39.0–52.0)
HEMOGLOBIN: 16.1 g/dL (ref 13.0–17.0)
LYMPHS PCT: 30.2 % (ref 12.0–46.0)
Lymphs Abs: 1.6 10*3/uL (ref 0.7–4.0)
MCHC: 33.3 g/dL (ref 30.0–36.0)
MCV: 92.5 fl (ref 78.0–100.0)
MONOS PCT: 8.2 % (ref 3.0–12.0)
Monocytes Absolute: 0.4 10*3/uL (ref 0.1–1.0)
Neutro Abs: 3.2 10*3/uL (ref 1.4–7.7)
Neutrophils Relative %: 59.5 % (ref 43.0–77.0)
Platelets: 172 10*3/uL (ref 150.0–400.0)
RBC: 5.22 Mil/uL (ref 4.22–5.81)
RDW: 13.6 % (ref 11.5–15.5)
WBC: 5.4 10*3/uL (ref 4.0–10.5)

## 2015-10-31 LAB — TSH: TSH: 2.37 u[IU]/mL (ref 0.35–4.50)

## 2015-10-31 LAB — CHOLESTEROL, TOTAL: CHOLESTEROL: 175 mg/dL (ref 0–200)

## 2015-10-31 NOTE — Progress Notes (Signed)
Subjective:    Patient ID: Nicholas Graham, male    DOB: August 27, 1951, 65 y.o.   MRN: [id number]  HPI   Elevated BP: Pt states he has taken his BP at home and he does  not remember the numbers. He walks about 1-2 miles a day, when the weather is nicer. He does not watch his diet, or the salt content. He denies chest pain, Shortness of breath or lower ext edema. He has had elevated BP documented in the EMR in the past, but never medicated.    Hyperlipidemia: Pt has taken Zocor every other day for a few years.  See above for diet/exercsie. He is wondering if he can discontinue the zocor. FHX of heart disease in Uncle. Former Smoker.   Colon Polyps: pthas a history of pre-cancerous polyps. No fhx of colon cancer. His last colonoscopy was in 2011 with recommended 5 year follow up. Pt reports the doctor that completed his colonoscopy has retired. He does not recall getting a letter to schedule. Overdue for 5 year repeat screen. Pt amendable to scheduling.  Past Medical History  Diagnosis Date  . Arm fracture     left. Dx'd w/RSD post fracture  . Plantar fasciitis   . Carpal tunnel syndrome   . Chest pain 2016    [facility] (Dr. Matthew Saras) doing stress echo, but feels like musculoskeletal chest wall pain is cause of his discomfort  . Tobacco dependence in remission    Allergies  Allergen Reactions  . Celebrex [Celecoxib]   . Cymbalta [Duloxetine Hcl]   . Lyrica [Pregabalin]   . Neurontin [Gabapentin]   . Penicillins     REACTION: hives   Past Surgical History  Procedure Laterality Date  . Inguinal hernia repair Left   . Back surgery  1993  . Knee arthroscopy Right    Family History  Problem Relation Age of Onset  . Cancer Mother   . Cancer Father   . Cancer Brother   . Heart disease Maternal Uncle    Social History   Social History  . Marital Status: Married    Spouse Name: N/A  . Number of Children: N/A  . Years of Education: N/A   Occupational History  . Not  on file.   Social History Main Topics  . Smoking status: Former Smoker -- 3.00 packs/day for 30 years    Quit date: 07/16/2002  . Smokeless tobacco: Never Used  . Alcohol Use: 10.8 oz/week    18 Cans of beer per week     Comment: socially  . Drug Use: No  . Sexual Activity: Yes   Other Topics Concern  . Not on file   Social History Narrative    Review of Systems Negative, with the exception of above mentioned in HPI     Objective:   Physical Exam BP 155/92 mmHg  Pulse 93  Temp(Src) 97.8 F (36.6 C)  Resp 20  Wt 230 lb 4 oz (104.441 kg)  SpO2 98% Gen: Afebrile. No acute distress. Nontoxic in appearance, well developed, well nourished. Pleasant caucasian male.  HENT: AT. [place]. Bilateral TM visualized and normal in appearance. MMM. Bilateral nares WNL. Throat without erythema or exudates. No cough on exam.  Eyes:Pupils Equal Round Reactive to light, Extraocular movements intact,  Conjunctiva without redness, discharge or icterus. Neck/lymp/endocrine: Supple,No lymphadenopathy, No thyromegaly CV: RRR, No edema Chest: CTAB, no wheeze or crackles Abd: Soft.NTND. BS present Neuro: Normal gait. PERLA. EOMi. Alert. Oriented x3 Psych: Normal affect,  dress and demeanor. Normal speech. Normal thought content and judgment.    Assessment & Plan:  Nicholas Graham is a 65 y.o. male present for follow up in chronic medical issues.  Elevated blood pressure - Low sodium diet  And > 150 minutes exercise encouraged. Monitor BP at home and > 140/90 will need to be sooner to discuss. - Nurse f/u 1 week for BP re-check, if elevated above range would need to start low dose med.  - CBC w/Diff - Comp Met (CMET) - HgB A1c - Cholesterol, Total - TSH  PERSONAL HX COLONIC POLYPS - H/o of pre-cancerous polyps, 5 year follow- up recommended at is 2011 colonoscopy. Overdue. - Ambulatory referral to Gastroenterology  Dyslipidemia - pt wants to dc zocor, currently taking QOD.  - Lipid panel  today, if able with DC. Discussed CV protection and benefits of statin today. - Continue QOD Zocor for now, as well diet and exercise modifications.   Elevated hemoglobin A1c - 5.8 on last check, rpt today.   F/U 1 week nurse visit for BP recheck.

## 2015-10-31 NOTE — Patient Instructions (Signed)
Low-Sodium Eating Plan Sodium raises blood pressure and causes water to be held in the body. Getting less sodium from food will help lower your blood pressure, reduce any swelling, and protect your heart, liver, and kidneys. We get sodium by adding salt (sodium chloride) to food. Most of our sodium comes from canned, boxed, and frozen foods. Restaurant foods, fast foods, and pizza are also very high in sodium. Even if you take medicine to lower your blood pressure or to reduce fluid in your body, getting less sodium from your food is important. WHAT IS MY PLAN? Most people should limit their sodium intake to 2,300 mg a day.  WHAT DO I NEED TO KNOW ABOUT THIS EATING PLAN? For the low-sodium eating plan, you will follow these general guidelines:  Choose foods with a % Daily Value for sodium of less than 5% (as listed on the food label).   Use salt-free seasonings or herbs instead of table salt or sea salt.   Check with your health care provider or pharmacist before using salt substitutes.   Eat fresh foods.  Eat more vegetables and fruits.  Limit canned vegetables. If you do use them, rinse them well to decrease the sodium.   Limit cheese to 1 oz (28 g) per day.   Eat lower-sodium products, often labeled as "lower sodium" or "no salt added."  Avoid foods that contain monosodium glutamate (MSG). MSG is sometimes added to [place] food and some canned foods.  Check food labels (Nutrition Facts labels) on foods to learn how much sodium is in one serving.  Eat more home-cooked food and less restaurant, buffet, and fast food.  When eating at a restaurant, ask that your food be prepared with less salt, or no salt if possible.  HOW DO I READ FOOD LABELS FOR SODIUM INFORMATION? The Nutrition Facts label lists the amount of sodium in one serving of the food. If you eat more than one serving, you must multiply the listed amount of sodium by the number of servings. Food labels may also  identify foods as:  Sodium free--Less than 5 mg in a serving.  Very low sodium--35 mg or less in a serving.  Low sodium--140 mg or less in a serving.  Light in sodium--50% less sodium in a serving. For example, if a food that usually has 300 mg of sodium is changed to become light in sodium, it will have 150 mg of sodium.  Reduced sodium--25% less sodium in a serving. For example, if a food that usually has 400 mg of sodium is changed to reduced sodium, it will have 300 mg of sodium. WHAT FOODS CAN I EAT? Grains Low-sodium cereals, including oats, puffed wheat and rice, and shredded wheat cereals. Low-sodium crackers. Unsalted rice and pasta. Lower-sodium bread.  Vegetables Frozen or fresh vegetables. Low-sodium or reduced-sodium canned vegetables. Low-sodium or reduced-sodium tomato sauce and paste. Low-sodium or reduced-sodium tomato and vegetable juices.  Fruits Fresh, frozen, and canned fruit. Fruit juice.  Meat and Other Protein Products Low-sodium canned tuna and salmon. Fresh or frozen meat, poultry, seafood, and fish. Lamb. Unsalted nuts. Dried beans, peas, and lentils without added salt. Unsalted canned beans. Homemade soups without salt. Eggs.  Dairy Milk. Soy milk. Ricotta cheese. Low-sodium or reduced-sodium cheeses. Yogurt.  Condiments Fresh and dried herbs and spices. Salt-free seasonings. Onion and garlic powders. Low-sodium varieties of mustard and ketchup. Fresh or refrigerated horseradish. Lemon juice.  Fats and Oils Reduced-sodium salad dressings. Unsalted butter.  Other Unsalted popcorn and  pretzels.  The items listed above may not be a complete list of recommended foods or beverages. Contact your dietitian for more options. WHAT FOODS ARE NOT RECOMMENDED? Grains Instant hot cereals. Bread stuffing, pancake, and biscuit mixes. Croutons. Seasoned rice or pasta mixes. Noodle soup cups. Boxed or frozen macaroni and cheese. Self-rising flour. Regular salted  crackers. Vegetables Regular canned vegetables. Regular canned tomato sauce and paste. Regular tomato and vegetable juices. Frozen vegetables in sauces. Salted [place] fries. Olives. Angie Fava. Relishes. Sauerkraut. Salsa. Meat and Other Protein Products Salted, canned, smoked, spiced, or pickled meats, seafood, or fish. Bacon, ham, sausage, hot dogs, corned beef, chipped beef, and packaged luncheon meats. Salt pork. Jerky. Pickled herring. Anchovies, regular canned tuna, and sardines. Salted nuts. Dairy Processed cheese and cheese spreads. Cheese curds. Blue cheese and cottage cheese. Buttermilk.  Condiments Onion and garlic salt, seasoned salt, table salt, and sea salt. Canned and packaged gravies. Worcestershire sauce. Tartar sauce. Barbecue sauce. Teriyaki sauce. Soy sauce, including reduced sodium. Steak sauce. Fish sauce. Oyster sauce. Cocktail sauce. Horseradish that you find on the shelf. Regular ketchup and mustard. Meat flavorings and tenderizers. Bouillon cubes. Hot sauce. Tabasco sauce. Marinades. Taco seasonings. Relishes. Fats and Oils Regular salad dressings. Salted butter. Margarine. Ghee. Bacon fat.  Other Potato and tortilla chips. Corn chips and puffs. Salted popcorn and pretzels. Canned or dried soups. Pizza. Frozen entrees and pot pies.  The items listed above may not be a complete list of foods and beverages to avoid. Contact your dietitian for more information.   This information is not intended to replace advice given to you by your health care provider. Make sure you discuss any questions you have with your health care provider.   Document Released: 02/23/2002 Document Revised: 09/24/2014 Document Reviewed: 07/08/2013 Elsevier Interactive Patient Education 2016 [employer]   Fat and Cholesterol Restricted Diet High levels of fat and cholesterol in your blood may lead to various health problems, such as diseases of the heart, blood vessels, gallbladder, liver,  and pancreas. Fats are concentrated sources of energy that come in various forms. Certain types of fat, including saturated fat, may be harmful in excess. Cholesterol is a substance needed by your body in small amounts. Your body makes all the cholesterol it needs. Excess cholesterol comes from the food you eat. When you have high levels of cholesterol and saturated fat in your blood, health problems can develop because the excess fat and cholesterol will gather along the walls of your blood vessels, causing them to narrow. Choosing the right foods will help you control your intake of fat and cholesterol. This will help keep the levels of these substances in your blood within normal limits and reduce your risk of disease. WHAT IS MY PLAN? Your health care provider recommends that you:  Get no more than __________ % of the total calories in your daily diet from fat.  Limit your intake of saturated fat to less than ______% of your total calories each day.  Limit the amount of cholesterol in your diet to less than _________mg per day. WHAT TYPES OF FAT SHOULD I CHOOSE?  Choose healthy fats more often. Choose monounsaturated and polyunsaturated fats, such as olive and canola oil, flaxseeds, walnuts, almonds, and seeds.  Eat more omega-3 fats. Good choices include salmon, mackerel, sardines, tuna, flaxseed oil, and ground flaxseeds. Aim to eat fish at least two times a week.  Limit saturated fats. Saturated fats are primarily found in animal products, such as meats, butter,  and cream. Plant sources of saturated fats include palm oil, palm kernel oil, and coconut oil.  Avoid foods with partially hydrogenated oils in them. These contain trans fats. Examples of foods that contain trans fats are stick margarine, some tub margarines, cookies, crackers, and other baked goods. WHAT GENERAL GUIDELINES DO I NEED TO FOLLOW? These guidelines for healthy eating will help you control your intake of fat and  cholesterol:  Check food labels carefully to identify foods with trans fats or high amounts of saturated fat.  Fill one half of your plate with vegetables and green salads.  Fill one fourth of your plate with whole grains. Look for the word "whole" as the first word in the ingredient list.  Fill one fourth of your plate with lean protein foods.  Limit fruit to two servings a day. Choose fruit instead of juice.  Eat more foods that contain soluble fiber. Examples of foods that contain this type of fiber are apples, broccoli, carrots, beans, peas, and barley. Aim to get 20-30 g of fiber per day.  Eat more home-cooked food and less restaurant, buffet, and fast food.  Limit or avoid alcohol.  Limit foods high in starch and sugar.  Limit fried foods.  Cook foods using methods other than frying. Baking, boiling, grilling, and broiling are all great options.  Lose weight if you are overweight. Losing just 5-10% of your initial body weight can help your overall health and prevent diseases such as diabetes and heart disease. WHAT FOODS CAN I EAT? Grains Whole grains, such as whole wheat or whole grain breads, crackers, cereals, and pasta. Unsweetened oatmeal, bulgur, barley, quinoa, or brown rice. Corn or whole wheat flour tortillas. Vegetables Fresh or frozen vegetables (raw, steamed, roasted, or grilled). Green salads. Fruits All fresh, canned (in natural juice), or frozen fruits. Meat and Other Protein Products Ground beef (85% or leaner), grass-fed beef, or beef trimmed of fat. Skinless chicken or [place]. Ground chicken or [place]. Pork trimmed of fat. All fish and seafood. Eggs. Dried beans, peas, or lentils. Unsalted nuts or seeds. Unsalted canned or dry beans. Dairy Low-fat dairy products, such as skim or 1% milk, 2% or reduced-fat cheeses, low-fat ricotta or cottage cheese, or plain low-fat yogurt. Fats and Oils Tub margarines without trans fats. Light or reduced-fat mayonnaise and  salad dressings. Avocado. Olive, canola, sesame, or safflower oils. Natural peanut or almond butter (choose ones without added sugar and oil). The items listed above may not be a complete list of recommended foods or beverages. Contact your dietitian for more options. WHAT FOODS ARE NOT RECOMMENDED? Grains White bread. White pasta. White rice. Cornbread. Bagels, pastries, and croissants. Crackers that contain trans fat. Vegetables White potatoes. Corn. Creamed or fried vegetables. Vegetables in a cheese sauce. Fruits Dried fruits. Canned fruit in light or heavy syrup. Fruit juice. Meat and Other Protein Products Fatty cuts of meat. Ribs, chicken wings, bacon, sausage, bologna, salami, chitterlings, fatback, hot dogs, bratwurst, and packaged luncheon meats. Liver and organ meats. Dairy Whole or 2% milk, cream, half-and-half, and cream cheese. Whole milk cheeses. Whole-fat or sweetened yogurt. Full-fat cheeses. Nondairy creamers and whipped toppings. Processed cheese, cheese spreads, or cheese curds. Sweets and Desserts Corn syrup, sugars, honey, and molasses. Candy. Jam and jelly. Syrup. Sweetened cereals. Cookies, pies, cakes, donuts, muffins, and ice cream. Fats and Oils Butter, stick margarine, lard, shortening, ghee, or bacon fat. Coconut, palm kernel, or palm oils. Beverages Alcohol. Sweetened drinks (such as sodas, lemonade, and fruit drinks  or punches). The items listed above may not be a complete list of foods and beverages to avoid. Contact your dietitian for more information.   This information is not intended to replace advice given to you by your health care provider. Make sure you discuss any questions you have with your health care provider.   Document Released: 09/03/2005 Document Revised: 09/24/2014 Document Reviewed: 12/02/2013 Elsevier Interactive Patient Education [employer].

## 2015-11-01 ENCOUNTER — Telehealth: Payer: Self-pay | Admitting: Family Medicine

## 2015-11-01 ENCOUNTER — Other Ambulatory Visit (INDEPENDENT_AMBULATORY_CARE_PROVIDER_SITE_OTHER): Payer: BLUE CROSS/BLUE SHIELD

## 2015-11-01 ENCOUNTER — Encounter: Payer: Self-pay | Admitting: Family Medicine

## 2015-11-01 DIAGNOSIS — E785 Hyperlipidemia, unspecified: Secondary | ICD-10-CM | POA: Diagnosis not present

## 2015-11-01 LAB — LIPID PANEL
CHOLESTEROL: 179 mg/dL (ref 0–200)
HDL: 44.9 mg/dL (ref >39.00)
LDL Cholesterol: 113 mg/dL — ABNORMAL HIGH (ref 0–99)
NonHDL: 134.41
TRIGLYCERIDES: 105 mg/dL (ref 0.0–149.0)
Total CHOL/HDL Ratio: 4
VLDL: 21 mg/dL (ref 0.0–40.0)

## 2015-11-01 NOTE — Telephone Encounter (Signed)
Please call pt: - His labs are stable. - he does have an a1c of 5.6. This is an improvement from prior check (5.8 --> 5.6) this places him in the prediabetic range and should be monitored every 6 months.   - His cholesterol is still mildly elevated (on a statin), every other day. His cholesterol is higher than last check in 2015. Considering he has borderline BP and prediabetes, former smoker, I would recommend he stays on his current dose of statin. Pt was desiring to discontinue use of statin of  If possible.  - we discussed dietary modifications and increasing exercise a this OV.  Please remind him of his nurse visit for BP rechecked (scheduled)

## 2015-11-01 NOTE — Telephone Encounter (Signed)
Spoke with patient wife reviewed information and instructions.

## 2015-11-02 ENCOUNTER — Encounter: Payer: Self-pay | Admitting: Family Medicine

## 2015-11-08 ENCOUNTER — Ambulatory Visit (INDEPENDENT_AMBULATORY_CARE_PROVIDER_SITE_OTHER): Payer: BLUE CROSS/BLUE SHIELD | Admitting: *Deleted

## 2015-11-08 VITALS — BP 135/91 | HR 76 | Resp 20 | Wt 225.8 lb

## 2015-11-08 DIAGNOSIS — R030 Elevated blood-pressure reading, without diagnosis of hypertension: Secondary | ICD-10-CM | POA: Diagnosis not present

## 2015-11-08 DIAGNOSIS — IMO0001 Reserved for inherently not codable concepts without codable children: Secondary | ICD-10-CM

## 2015-11-08 NOTE — Progress Notes (Signed)
Patient presents today for recheck BP per Dr Raoul Pitch. Copy of patient home readings copied for chart. Dr Raoul Pitch reviewed patients BP readings. Patient to follow up as directed.

## 2015-12-13 ENCOUNTER — Other Ambulatory Visit: Payer: Self-pay | Admitting: *Deleted

## 2015-12-13 ENCOUNTER — Other Ambulatory Visit: Payer: Self-pay | Admitting: Family Medicine

## 2015-12-13 MED ORDER — PANTOPRAZOLE SODIUM 40 MG PO TBEC: 40.0000 mg | DELAYED_RELEASE_TABLET | Freq: Every day | ORAL | Status: DC

## 2015-12-27 ENCOUNTER — Encounter: Payer: Self-pay | Admitting: Family Medicine

## 2016-04-17 DIAGNOSIS — R911 Solitary pulmonary nodule: Secondary | ICD-10-CM

## 2016-04-17 HISTORY — DX: Solitary pulmonary nodule: R91.1

## 2016-04-23 ENCOUNTER — Ambulatory Visit (INDEPENDENT_AMBULATORY_CARE_PROVIDER_SITE_OTHER): Payer: BLUE CROSS/BLUE SHIELD | Admitting: Family Medicine

## 2016-04-23 ENCOUNTER — Other Ambulatory Visit: Payer: Self-pay | Admitting: Family Medicine

## 2016-04-23 ENCOUNTER — Encounter: Payer: Self-pay | Admitting: Family Medicine

## 2016-04-23 VITALS — BP 150/89 | HR 108 | Temp 98.0°F | Resp 20 | Ht 74.0 in | Wt 227.8 lb

## 2016-04-23 DIAGNOSIS — Z13220 Encounter for screening for lipoid disorders: Secondary | ICD-10-CM | POA: Diagnosis not present

## 2016-04-23 DIAGNOSIS — N5089 Other specified disorders of the male genital organs: Secondary | ICD-10-CM | POA: Insufficient documentation

## 2016-04-23 DIAGNOSIS — Z87891 Personal history of nicotine dependence: Secondary | ICD-10-CM

## 2016-04-23 DIAGNOSIS — Z125 Encounter for screening for malignant neoplasm of prostate: Secondary | ICD-10-CM

## 2016-04-23 DIAGNOSIS — I10 Essential (primary) hypertension: Secondary | ICD-10-CM

## 2016-04-23 DIAGNOSIS — Z1329 Encounter for screening for other suspected endocrine disorder: Secondary | ICD-10-CM | POA: Diagnosis not present

## 2016-04-23 DIAGNOSIS — R1032 Left lower quadrant pain: Secondary | ICD-10-CM | POA: Diagnosis not present

## 2016-04-23 DIAGNOSIS — L989 Disorder of the skin and subcutaneous tissue, unspecified: Secondary | ICD-10-CM

## 2016-04-23 DIAGNOSIS — R6889 Other general symptoms and signs: Secondary | ICD-10-CM | POA: Diagnosis not present

## 2016-04-23 DIAGNOSIS — R7309 Other abnormal glucose: Secondary | ICD-10-CM

## 2016-04-23 DIAGNOSIS — N509 Disorder of male genital organs, unspecified: Secondary | ICD-10-CM | POA: Diagnosis not present

## 2016-04-23 DIAGNOSIS — R300 Dysuria: Secondary | ICD-10-CM

## 2016-04-23 DIAGNOSIS — Z0001 Encounter for general adult medical examination with abnormal findings: Secondary | ICD-10-CM | POA: Diagnosis not present

## 2016-04-23 DIAGNOSIS — E785 Hyperlipidemia, unspecified: Secondary | ICD-10-CM

## 2016-04-23 LAB — CBC WITH DIFFERENTIAL/PLATELET
Basophils Absolute: 0 10*3/uL (ref 0.0–0.1)
Basophils Relative: 0.5 % (ref 0.0–3.0)
EOS PCT: 1.9 % (ref 0.0–5.0)
Eosinophils Absolute: 0.1 10*3/uL (ref 0.0–0.7)
HCT: 47.5 % (ref 39.0–52.0)
HEMOGLOBIN: 16.3 g/dL (ref 13.0–17.0)
LYMPHS PCT: 32.9 % (ref 12.0–46.0)
Lymphs Abs: 1.9 10*3/uL (ref 0.7–4.0)
MCHC: 34.4 g/dL (ref 30.0–36.0)
MCV: 92.4 fl (ref 78.0–100.0)
MONO ABS: 0.5 10*3/uL (ref 0.1–1.0)
MONOS PCT: 8.5 % (ref 3.0–12.0)
Neutro Abs: 3.2 10*3/uL (ref 1.4–7.7)
Neutrophils Relative %: 56.2 % (ref 43.0–77.0)
Platelets: 158 10*3/uL (ref 150.0–400.0)
RBC: 5.14 Mil/uL (ref 4.22–5.81)
RDW: 13.1 % (ref 11.5–15.5)
WBC: 5.7 10*3/uL (ref 4.0–10.5)

## 2016-04-23 LAB — COMPREHENSIVE METABOLIC PANEL
ALBUMIN: 4.5 g/dL (ref 3.5–5.2)
ALK PHOS: 57 U/L (ref 39–117)
ALT: 16 U/L (ref 0–53)
AST: 16 U/L (ref 0–37)
BUN: 14 mg/dL (ref 6–23)
CO2: 25 mEq/L (ref 19–32)
CREATININE: 0.95 mg/dL (ref 0.40–1.50)
Calcium: 9.6 mg/dL (ref 8.4–10.5)
Chloride: 106 mEq/L (ref 96–112)
GFR: 85.17 mL/min (ref >60.00)
Glucose, Bld: 112 mg/dL — ABNORMAL HIGH (ref 70–99)
POTASSIUM: 4.5 meq/L (ref 3.5–5.1)
SODIUM: 140 meq/L (ref 135–145)
TOTAL PROTEIN: 7 g/dL (ref 6.0–8.3)
Total Bilirubin: 0.7 mg/dL (ref 0.2–1.2)

## 2016-04-23 LAB — LIPID PANEL
Cholesterol: 170 mg/dL (ref 0–200)
HDL: 37.8 mg/dL — AB (ref >39.00)
LDL Cholesterol: 100 mg/dL — ABNORMAL HIGH (ref 0–99)
NonHDL: 132.32
Total CHOL/HDL Ratio: 5
Triglycerides: 164 mg/dL — ABNORMAL HIGH (ref 0.0–149.0)
VLDL: 32.8 mg/dL (ref 0.0–40.0)

## 2016-04-23 LAB — TSH: TSH: 2.52 u[IU]/mL (ref 0.35–4.50)

## 2016-04-23 LAB — PSA: PSA: 2.03 ng/mL (ref 0.10–4.00)

## 2016-04-23 MED ORDER — HYDROCHLOROTHIAZIDE 25 MG PO TABS: 25.0000 mg | ORAL_TABLET | Freq: Every day | ORAL | 1 refills | Status: DC

## 2016-04-23 NOTE — Patient Instructions (Addendum)
Health Maintenance, Male A healthy lifestyle and preventative care can promote health and wellness.  Maintain regular health, dental, and eye exams.  Eat a healthy diet. Foods like vegetables, fruits, whole grains, low-fat dairy products, and lean protein foods contain the nutrients you need and are low in calories. Decrease your intake of foods high in solid fats, added sugars, and salt. Get information about a proper diet from your health care provider, if necessary.  Regular physical exercise is one of the most important things you can do for your health. Most adults should get at least 150 minutes of moderate-intensity exercise (any activity that increases your heart rate and causes you to sweat) each week. In addition, most adults need muscle-strengthening exercises on 2 or more days a week.   Maintain a healthy weight. The body mass index (BMI) is a screening tool to identify possible weight problems. It provides an estimate of body fat based on height and weight. Your health care provider can find your BMI and can help you achieve or maintain a healthy weight. For males 20 years and older:  A BMI below 18.5 is considered underweight.  A BMI of 18.5 to 24.9 is normal.  A BMI of 25 to 29.9 is considered overweight.  A BMI of 30 and above is considered obese.  Maintain normal blood lipids and cholesterol by exercising and minimizing your intake of saturated fat. Eat a balanced diet with plenty of fruits and vegetables. Blood tests for lipids and cholesterol should begin at age 20 and be repeated every 5 years. If your lipid or cholesterol levels are high, you are over age 50, or you are at high risk for heart disease, you may need your cholesterol levels checked more frequently.Ongoing high lipid and cholesterol levels should be treated with medicines if diet and exercise are not working.  If you smoke, find out from your health care provider how to quit. If you do not use tobacco, do not  start.  Lung cancer screening is recommended for adults aged 55-80 years who are at high risk for developing lung cancer because of a history of smoking. A yearly low-dose CT scan of the lungs is recommended for people who have at least a 30-pack-year history of smoking and are current smokers or have quit within the past 15 years. A pack year of smoking is smoking an average of 1 pack of cigarettes a day for 1 year (for example, a 30-pack-year history of smoking could mean smoking 1 pack a day for 30 years or 2 packs a day for 15 years). Yearly screening should continue until the smoker has stopped smoking for at least 15 years. Yearly screening should be stopped for people who develop a health problem that would prevent them from having lung cancer treatment.  If you choose to drink alcohol, do not have more than 2 drinks per day. One drink is considered to be 12 oz (360 mL) of beer, 5 oz (150 mL) of wine, or 1.5 oz (45 mL) of liquor.  Avoid the use of street drugs. Do not share needles with anyone. Ask for help if you need support or instructions about stopping the use of drugs.  High blood pressure causes heart disease and increases the risk of stroke. High blood pressure is more likely to develop in:  People who have blood pressure in the end of the normal range (100-139/85-89 mm Hg).  People who are overweight or obese.  People who are African American.    If you are 18-39 years of age, have your blood pressure checked every 3-5 years. If you are 40 years of age or older, have your blood pressure checked every year. You should have your blood pressure measured twice--once when you are at a hospital or clinic, and once when you are not at a hospital or clinic. Record the average of the two measurements. To check your blood pressure when you are not at a hospital or clinic, you can use:  An automated blood pressure machine at a pharmacy.  A home blood pressure monitor.  If you are 45-79 years  old, ask your health care provider if you should take aspirin to prevent heart disease.  Diabetes screening involves taking a blood sample to check your fasting blood sugar level. This should be done once every 3 years after age 45 if you are at a normal weight and without risk factors for diabetes. Testing should be considered at a younger age or be carried out more frequently if you are overweight and have at least 1 risk factor for diabetes.  Colorectal cancer can be detected and often prevented. Most routine colorectal cancer screening begins at the age of 50 and continues through age 75. However, your health care provider may recommend screening at an earlier age if you have risk factors for colon cancer. On a yearly basis, your health care provider may provide home test kits to check for hidden blood in the stool. A small camera at the end of a tube may be used to directly examine the colon (sigmoidoscopy or colonoscopy) to detect the earliest forms of colorectal cancer. Talk to your health care provider about this at age 50 when routine screening begins. A direct exam of the colon should be repeated every 5-10 years through age 75, unless early forms of precancerous polyps or small growths are found.  People who are at an increased risk for hepatitis B should be screened for this virus. You are considered at high risk for hepatitis B if:  You were born in a country where hepatitis B occurs often. Talk with your health care provider about which countries are considered high risk.  Your parents were born in a high-risk country and you have not received a shot to protect against hepatitis B (hepatitis B vaccine).  You have HIV or AIDS.  You use needles to inject street drugs.  You live with, or have sex with, someone who has hepatitis B.  You are a man who has sex with other men (MSM).  You get hemodialysis treatment.  You take certain medicines for conditions like cancer, organ  transplantation, and autoimmune conditions.  Hepatitis C blood testing is recommended for all people born from 1945 through 1965 and any individual with known risk factors for hepatitis C.  Healthy men should no longer receive prostate-specific antigen (PSA) blood tests as part of routine cancer screening. Talk to your health care provider about prostate cancer screening.  Testicular cancer screening is not recommended for adolescents or adult males who have no symptoms. Screening includes self-exam, a health care provider exam, and other screening tests. Consult with your health care provider about any symptoms you have or any concerns you have about testicular cancer.  Practice safe sex. Use condoms and avoid high-risk sexual practices to reduce the spread of sexually transmitted infections (STIs).  You should be screened for STIs, including gonorrhea and chlamydia if:  You are sexually active and are younger than 24 years.  You   are older than 24 years, and your health care provider tells you that you are at risk for this type of infection.  Your sexual activity has changed since you were last screened, and you are at an increased risk for chlamydia or gonorrhea. Ask your health care provider if you are at risk.  If you are at risk of being infected with HIV, it is recommended that you take a prescription medicine daily to prevent HIV infection. This is called pre-exposure prophylaxis (PrEP). You are considered at risk if:  You are a man who has sex with other men (MSM).  You are a heterosexual man who is sexually active with multiple partners.  You take drugs by injection.  You are sexually active with a partner who has HIV.  Talk with your health care provider about whether you are at high risk of being infected with HIV. If you choose to begin PrEP, you should first be tested for HIV. You should then be tested every 3 months for as long as you are taking PrEP.  Use sunscreen. Apply  sunscreen liberally and repeatedly throughout the day. You should seek shade when your shadow is shorter than you. Protect yourself by wearing long sleeves, pants, a wide-brimmed hat, and sunglasses year round whenever you are outdoors.  Tell your health care provider of new moles or changes in moles, especially if there is a change in shape or color. Also, tell your health care provider if a mole is larger than the size of a pencil eraser.  A one-time screening for abdominal aortic aneurysm (AAA) and surgical repair of large AAAs by ultrasound is recommended for men aged 73-75 years who are current or former smokers.  Stay current with your vaccines (immunizations).   This information is not intended to replace advice given to you by your health care provider. Make sure you discuss any questions you have with your health care provider.   Document Released: 03/01/2008 Document Revised: 09/24/2014 Document Reviewed: 01/29/2011 Elsevier Interactive Patient Education 2016 [employer]   Low-Sodium Eating Plan Sodium raises blood pressure and causes water to be held in the body. Getting less sodium from food will help lower your blood pressure, reduce any swelling, and protect your heart, liver, and kidneys. We get sodium by adding salt (sodium chloride) to food. Most of our sodium comes from canned, boxed, and frozen foods. Restaurant foods, fast foods, and pizza are also very high in sodium. Even if you take medicine to lower your blood pressure or to reduce fluid in your body, getting less sodium from your food is important. WHAT IS MY PLAN? Most people should limit their sodium intake to 2,300 mg a day. Your health care provider recommends that you limit your sodium intake to ______2000____ a day.  WHAT DO I NEED TO KNOW ABOUT THIS EATING PLAN? For the low-sodium eating plan, you will follow these general guidelines:  Choose foods with a % Daily Value for sodium of less than 5% (as listed on  the food label).   Use salt-free seasonings or herbs instead of table salt or sea salt.   Check with your health care provider or pharmacist before using salt substitutes.   Eat fresh foods.  Eat more vegetables and fruits.  Limit canned vegetables. If you do use them, rinse them well to decrease the sodium.   Limit cheese to 1 oz (28 g) per day.   Eat lower-sodium products, often labeled as "lower sodium" or "no salt added."  Avoid foods that contain monosodium glutamate (MSG). MSG is sometimes added to [place] food and some canned foods.  Check food labels (Nutrition Facts labels) on foods to learn how much sodium is in one serving.  Eat more home-cooked food and less restaurant, buffet, and fast food.  When eating at a restaurant, ask that your food be prepared with less salt, or no salt if possible.  HOW DO I READ FOOD LABELS FOR SODIUM INFORMATION? The Nutrition Facts label lists the amount of sodium in one serving of the food. If you eat more than one serving, you must multiply the listed amount of sodium by the number of servings. Food labels may also identify foods as:  Sodium free--Less than 5 mg in a serving.  Very low sodium--35 mg or less in a serving.  Low sodium--140 mg or less in a serving.  Light in sodium--50% less sodium in a serving. For example, if a food that usually has 300 mg of sodium is changed to become light in sodium, it will have 150 mg of sodium.  Reduced sodium--25% less sodium in a serving. For example, if a food that usually has 400 mg of sodium is changed to reduced sodium, it will have 300 mg of sodium. WHAT FOODS CAN I EAT? Grains Low-sodium cereals, including oats, puffed wheat and rice, and shredded wheat cereals. Low-sodium crackers. Unsalted rice and pasta. Lower-sodium bread.  Vegetables Frozen or fresh vegetables. Low-sodium or reduced-sodium canned vegetables. Low-sodium or reduced-sodium tomato sauce and paste. Low-sodium  or reduced-sodium tomato and vegetable juices.  Fruits Fresh, frozen, and canned fruit. Fruit juice.  Meat and Other Protein Products Low-sodium canned tuna and salmon. Fresh or frozen meat, poultry, seafood, and fish. Lamb. Unsalted nuts. Dried beans, peas, and lentils without added salt. Unsalted canned beans. Homemade soups without salt. Eggs.  Dairy Milk. Soy milk. Ricotta cheese. Low-sodium or reduced-sodium cheeses. Yogurt.  Condiments Fresh and dried herbs and spices. Salt-free seasonings. Onion and garlic powders. Low-sodium varieties of mustard and ketchup. Fresh or refrigerated horseradish. Lemon juice.  Fats and Oils Reduced-sodium salad dressings. Unsalted butter.  Other Unsalted popcorn and pretzels.  The items listed above may not be a complete list of recommended foods or beverages. Contact your dietitian for more options. WHAT FOODS ARE NOT RECOMMENDED? Grains Instant hot cereals. Bread stuffing, pancake, and biscuit mixes. Croutons. Seasoned rice or pasta mixes. Noodle soup cups. Boxed or frozen macaroni and cheese. Self-rising flour. Regular salted crackers. Vegetables Regular canned vegetables. Regular canned tomato sauce and paste. Regular tomato and vegetable juices. Frozen vegetables in sauces. Salted [place] fries. Olives. Angie Fava. Relishes. Sauerkraut. Salsa. Meat and Other Protein Products Salted, canned, smoked, spiced, or pickled meats, seafood, or fish. Bacon, ham, sausage, hot dogs, corned beef, chipped beef, and packaged luncheon meats. Salt pork. Jerky. Pickled herring. Anchovies, regular canned tuna, and sardines. Salted nuts. Dairy Processed cheese and cheese spreads. Cheese curds. Blue cheese and cottage cheese. Buttermilk.  Condiments Onion and garlic salt, seasoned salt, table salt, and sea salt. Canned and packaged gravies. Worcestershire sauce. Tartar sauce. Barbecue sauce. Teriyaki sauce. Soy sauce, including reduced sodium. Steak sauce. Fish  sauce. Oyster sauce. Cocktail sauce. Horseradish that you find on the shelf. Regular ketchup and mustard. Meat flavorings and tenderizers. Bouillon cubes. Hot sauce. Tabasco sauce. Marinades. Taco seasonings. Relishes. Fats and Oils Regular salad dressings. Salted butter. Margarine. Ghee. Bacon fat.  Other Potato and tortilla chips. Corn chips and puffs. Salted popcorn and pretzels. Canned or dried soups.  Pizza. Frozen entrees and pot pies.  The items listed above may not be a complete list of foods and beverages to avoid. Contact your dietitian for more information.   This information is not intended to replace advice given to you by your health care provider. Make sure you discuss any questions you have with your health care provider.   Document Released: 02/23/2002 Document Revised: 09/24/2014 Document Reviewed: 07/08/2013 Elsevier Interactive Patient Education 2016 [place] HCTZ today, take your BP ( at least 2 hours after taking medicine) at home and write down results.  followup 1 week for nurse check for BP  You will hear from [place] to set up imaging of abd.

## 2016-04-23 NOTE — Progress Notes (Signed)
Patient ID: Nicholas Graham, male  DOB: Aug 22, 1951, 65 y.o.   MRN: [id number] Patient Care Team    Relationship Specialty Notifications Start End  [name], DO PCP - General Family Medicine  10/31/15   [name], MD Consulting Physician Gastroenterology  04/24/16     Subjective:  Nicholas Graham is a 65 y.o. male present for CPE with complaints.  All past medical history, surgical history, allergies, family history, immunizations, medications and social history were updated in the electronic medical record today. All recent labs, ED visits and hospitalizations within the last year were reviewed.  Health maintenance:  Colonoscopy: last screen 2011, recommend follow up 33yr resulted diverticulosis, prior h/o of polyps. He has Repeat scheduled for 2018. Prior with Dr. KDeatra Ina  Immunizations: declines all immunizations.  Infectious disease screening: Declines all infectious disease screening.  PSA:  Lab Results  Component Value Date   PSA 2.03 04/23/2016  , pt was counseled on prostate cancer screenings. He desires screening today.  Lung cancer screening: He is currently a nonsmoker, He quit in 2003 with a 90-pack-year history. He is asymptomatic. Patient would like to have low dose CT lung screening. He was counseled on benefits vs risk. He would like to get his LLQ pain CT first and schedule chest CT after if possible.  Assistive device: None Oxygen use: None Patient has a Dental home. Hospitalizations/ED visits: None  Left testicular pain:  Patient complains of left nuclear pain today. He is worried because he states he feels a lump in his testicle. He states he has noticed this lump approximally 2-3 months ago, and he feels the lump is getting larger. He endorses burning with urination, changes in urinary stream, mild urinary frequency, nocturia 3 times on average. He states none of these symptoms existed until approximately 2-3 months ago. Patient has a history of  diverticulosis, colon polyp, and inguinal hernia repair on the left side, that needed repeated in 2013. Colonoscopy is overdue, however it is scheduled. He denies any changes in bowel movements, no hematochezia, no melena, no changes in caliber. History of lung cancer in both parents, and bone cancer in his brother. Patient denies fever, chills, night sweats or unintentional weight loss. Patient denies any bone pain. Patient denies any recent trauma or heavy lifting prior to onset of symptoms.   There is no immunization history on file for this patient. Depression screen PHQ 2/9 04/23/2016  Decreased Interest 0  Down, Depressed, Hopeless 0  PHQ - 2 Score 0   Current Exercise Habits: Home exercise routine, Type of exercise: walking, Time (Minutes): 30, Frequency (Times/Week): 3, Weekly Exercise (Minutes/Week): 90, Intensity: Mild    Past Medical History:  Diagnosis Date  . Arm fracture    left. Dx'd w/RSD post fracture  . Carpal tunnel syndrome   . Chest pain 2016   [facility] (Dr. H[facility] Saras doing stress echo, but feels like musculoskeletal chest wall pain is cause of his discomfort  . Diverticulosis   . GERD (gastroesophageal reflux disease)   . Hyperlipidemia   . Nephrolithiasis   . Plantar fasciitis   . Tobacco dependence in remission    Allergies  Allergen Reactions  . Celebrex [Celecoxib]   . Cymbalta [Duloxetine Hcl]   . Lyrica [Pregabalin]   . Neurontin [Gabapentin]   . Penicillins     REACTION: hives   Past Surgical History:  Procedure Laterality Date  . BACK SURGERY  1993  . INGUINAL HERNIA REPAIR Left  2013   repeat hernia repair.   Marland Kitchen KNEE ARTHROSCOPY Right    Family History  Problem Relation Age of Onset  . Lung cancer Mother   . Lung cancer Father   . Bone cancer Brother     bone  . Heart disease Maternal Uncle    Social History   Social History  . Marital status: Married    Spouse name: N/A  . Number of children: N/A  . Years of education: N/A     Occupational History  . Not on file.   Social History Main Topics  . Smoking status: Former Smoker    Packs/day: 3.00    Years: 30.00    Quit date: 07/16/2002  . Smokeless tobacco: Never Used  . Alcohol use 10.8 oz/week    18 Cans of beer per week     Comment: socially  . Drug use: No  . Sexual activity: Yes   Other Topics Concern  . Not on file   Social History Narrative   Married, [name]. Children (2) Adult, 4 grandchildren.    9 th grade, Retired.   Wears seatbelt   Smoke detector in the home.    Firearms locked in the home.    Feels safe in his relationships.      Medication List       Accurate as of 04/23/16 11:59 PM. Always use your most recent med list.          acetaminophen 500 MG tablet Commonly known as:  TYLENOL Take 500 mg by mouth.   aspirin 81 MG tablet Take 81 mg by mouth daily.   hydrochlorothiazide 25 MG tablet Commonly known as:  HYDRODIURIL Take 1 tablet (25 mg total) by mouth daily.   pantoprazole 40 MG tablet Commonly known as:  PROTONIX Take 1 tablet (40 mg total) by mouth daily.   simvastatin 10 MG tablet Commonly known as:  ZOCOR Take 1 tablet (10 mg total) by mouth every other day.        Recent Results (from the past 2160 hour(s))  CBC w/Diff     Status: None   Collection Time: 04/23/16  8:27 AM  Result Value Ref Range   WBC 5.7 4.0 - 10.5 K/uL   RBC 5.14 4.22 - 5.81 Mil/uL   Hemoglobin 16.3 13.0 - 17.0 g/dL   HCT 47.5 39.0 - 52.0 %   MCV 92.4 78.0 - 100.0 fl   MCHC 34.4 30.0 - 36.0 g/dL   RDW 13.1 11.5 - 15.5 %   Platelets 158.0 150.0 - 400.0 K/uL   Neutrophils Relative % 56.2 43.0 - 77.0 %   Lymphocytes Relative 32.9 12.0 - 46.0 %   Monocytes Relative 8.5 3.0 - 12.0 %   Eosinophils Relative 1.9 0.0 - 5.0 %   Basophils Relative 0.5 0.0 - 3.0 %   Neutro Abs 3.2 1.4 - 7.7 K/uL   Lymphs Abs 1.9 0.7 - 4.0 K/uL   Monocytes Absolute 0.5 0.1 - 1.0 K/uL   Eosinophils Absolute 0.1 0.0 - 0.7 K/uL   Basophils  Absolute 0.0 0.0 - 0.1 K/uL  Comp Met (CMET)     Status: Abnormal   Collection Time: 04/23/16  8:27 AM  Result Value Ref Range   Sodium 140 135 - 145 mEq/L   Potassium 4.5 3.5 - 5.1 mEq/L   Chloride 106 96 - 112 mEq/L   CO2 25 19 - 32 mEq/L   Glucose, Bld 112 (H) 70 - 99 mg/dL   BUN  14 6 - 23 mg/dL   Creatinine, Ser 0.95 0.40 - 1.50 mg/dL   Total Bilirubin 0.7 0.2 - 1.2 mg/dL   Alkaline Phosphatase 57 39 - 117 U/L   AST 16 0 - 37 U/L   ALT 16 0 - 53 U/L   Total Protein 7.0 6.0 - 8.3 g/dL   Albumin 4.5 3.5 - 5.2 g/dL   Calcium 9.6 8.4 - 10.5 mg/dL   GFR 85.17 >60.00 mL/min  TSH     Status: None   Collection Time: 04/23/16  8:27 AM  Result Value Ref Range   TSH 2.52 0.35 - 4.50 uIU/mL  Lipid panel     Status: Abnormal   Collection Time: 04/23/16  8:27 AM  Result Value Ref Range   Cholesterol 170 0 - 200 mg/dL    Comment: ATP III Classification       Desirable:  < 200 mg/dL               Borderline High:  200 - 239 mg/dL          High:  > = 240 mg/dL   Triglycerides 164.0 (H) 0.0 - 149.0 mg/dL    Comment: Normal:  <150 mg/dLBorderline High:  150 - 199 mg/dL   HDL 37.80 (L) >39.00 mg/dL   VLDL 32.8 0.0 - 40.0 mg/dL   LDL Cholesterol 100 (H) 0 - 99 mg/dL   Total CHOL/HDL Ratio 5     Comment:                Men          Women1/2 Average Risk     3.4          3.3Average Risk          5.0          4.42X Average Risk          9.6          7.13X Average Risk          15.0          11.0                       NonHDL 132.32     Comment: NOTE:  Non-HDL goal should be 30 mg/dL higher than patient's LDL goal (i.e. LDL goal of < 70 mg/dL, would have non-HDL goal of < 100 mg/dL)  PSA     Status: None   Collection Time: 04/23/16  8:27 AM  Result Value Ref Range   PSA 2.03 0.10 - 4.00 ng/mL  Urinalysis, Routine w reflex microscopic (not at [facility])     Status: Abnormal   Collection Time: 04/23/16  9:41 AM  Result Value Ref Range   Color, Urine DARK YELLOW YELLOW    Comment: ** Please note  change in unit of measure and reference range(s). **      APPearance CLOUDY (A) CLEAR   Specific Gravity, Urine 1.025 1.001 - 1.035   pH 5.5 5.0 - 8.0   Glucose, UA NEGATIVE NEGATIVE   Bilirubin Urine NEGATIVE NEGATIVE   Ketones, ur NEGATIVE NEGATIVE   Hgb urine dipstick NEGATIVE NEGATIVE   Protein, ur 1+ (A) NEGATIVE   Nitrite NEGATIVE NEGATIVE   Leukocytes, UA NEGATIVE NEGATIVE  Urine Microscopic     Status: None   Collection Time: 04/23/16  9:41 AM  Result Value Ref Range   WBC, UA NONE SEEN <=5 WBC/HPF   RBC / HPF  0-2 <=2 RBC/HPF   Squamous Epithelial / LPF NONE SEEN <=5 HPF   Bacteria, UA NONE SEEN NONE SEEN HPF   Crystals NONE SEEN NONE SEEN HPF   Casts NONE SEEN NONE SEEN LPF   Yeast NONE SEEN NONE SEEN HPF     ROS: 14 pt review of systems performed and negative (unless mentioned in an HPI)  Objective: BP (!) 150/89   Pulse (!) 108   Temp 98 F (36.7 C)   Resp 20   Ht '6\' 2"'  (1.88 m)   Wt 227 lb 12.8 oz (103.3 kg)   SpO2 98%   BMI 29.25 kg/m  Gen: Afebrile. No acute distress. Nontoxic in appearance, well-developed, well-nourished,  Caucasian male, pleasant  HENT: AT. [place]. Bilateral TM visualized and normal in appearance, normal external auditory canal. MMM, no oral lesions, adequate dentition. Bilateral nares within normal limits. Throat without erythema, ulcerations or exudates. no Cough on exam, no hoarseness on exam. Eyes:Pupils Equal Round Reactive to light, Extraocular movements intact,  Conjunctiva without redness, discharge or icterus. Neck/lymp/endocrine: Supple, no lymphadenopathy, no thyromegaly CV: RRR no murmur, trace edema, +2/4 P posterior tibialis pulses.  Chest: CTAB, no wheeze, rhonchi or crackles. Normal  Respiratory effort. good Air movement. Abd: Soft. round.ND. Tenderness LLQ. BS present. no Masses palpated. No hepatosplenomegaly. No rebound tenderness or guarding. Skin: no rashes, purpura or petechiae. Warm and well-perfused. Skin intact. ~37m  round, hyperpigmented/redish lesion right mid medial thigh. Mildly elevated. Neuro/Msk: Normal gait. PERLA. EOMi. Alert. Oriented x3.  Cranial nerves II through XII intact. Muscle strength 5/5 upper/lower extremity. DTRs equal bilaterally. Psych: Normal affect, dress and demeanor. Normal speech. Normal thought content and judgment. GU: Male genitalia: not done no penile lesions or discharge no bladder distension noted Penis: normal, no lesions and circumcised Testicles: tender: left Scrotum: tenderness, fullness, ? hernia Epididymis: normal Skin of perineum: normal Rectal examination: normal and anal sphincter normal Prostate examination: normal for age, normal consistency, non tender, no specific nodules and symmetrical   Assessment/plan: FJENARO SOUDERis a 65y.o. male present for CPE. Encounter for general adult medical examination with abnormal findings Lipid screening/Dyslipidemia Prostate cancer screening Screening for thyroid disorder Patient was encouraged to exercise greater than 150 minutes a week. Patient was encouraged to choose a diet filled with fresh fruits and vegetables, and lean meats. - Lipid panel - PSA - TSH - CBC w/Diff - Comp Met (CMET)  Elevated blood pressure - elevated. Start - hctz 25 mg with 1 week nurse visit to recheck BP.  - BP repeated and remained elevated, although improved.  - Low salt diet. Exercise encouraged.   Elevated hemoglobin A1c 5.6 in the past. Pt to continue monitoring diet and exercise. Idf weight fgain or elevated glucose will retest.   Lung cancer screen: - Shared decision making counseling completed (see above).  - low dose CT within next year  LLQ pain/left scrotal mass:  - urinalysis -CT ABD/pelvis, concern for mass or hernia reoccurrence.  - May need scrotal [place] well if CT is unable to capture area and abd/pelvis normal.   Skin lesion: - discussed with pt if he would like to have lesion removed from medial right  thigh, he can schedule a procedure appt and we can remove for him. Lesion has been present > 1 year.   Return in about 1 week (around 04/30/2016), or bp check, for nurse visit.  Electronically signed by: RHoward Pouch DO [facility]

## 2016-04-24 ENCOUNTER — Telehealth: Payer: Self-pay | Admitting: Family Medicine

## 2016-04-24 DIAGNOSIS — L989 Disorder of the skin and subcutaneous tissue, unspecified: Secondary | ICD-10-CM | POA: Insufficient documentation

## 2016-04-24 DIAGNOSIS — Z87891 Personal history of nicotine dependence: Secondary | ICD-10-CM | POA: Insufficient documentation

## 2016-04-24 LAB — URINALYSIS, ROUTINE W REFLEX MICROSCOPIC
Bilirubin Urine: NEGATIVE
GLUCOSE, UA: NEGATIVE
Hgb urine dipstick: NEGATIVE
Ketones, ur: NEGATIVE
LEUKOCYTES UA: NEGATIVE
NITRITE: NEGATIVE
PH: 5.5 (ref 5.0–8.0)
SPECIFIC GRAVITY, URINE: 1.025 (ref 1.001–1.035)

## 2016-04-24 LAB — URINALYSIS, MICROSCOPIC ONLY
Bacteria, UA: NONE SEEN [HPF]
CASTS: NONE SEEN [LPF]
CRYSTALS: NONE SEEN [HPF]
SQUAMOUS EPITHELIAL / LPF: NONE SEEN [HPF] (ref <=5)
WBC, UA: NONE SEEN WBC/HPF (ref <=5)
YEAST: NONE SEEN [HPF]

## 2016-04-24 LAB — URINE CULTURE: ORGANISM ID, BACTERIA: NO GROWTH

## 2016-04-24 NOTE — Telephone Encounter (Signed)
Spoke with patients wife reviewed lab results and instructions answered all questions.

## 2016-04-24 NOTE — Telephone Encounter (Signed)
Please call pt: - His labs are all stable.  - His triglycerides are a little higher than desired. He could add fish oil to his supplementation and continue his statin, fish oil helps target triglycerides as well increasing exercise and fiber, and low saturated fat/sugar.

## 2016-04-30 ENCOUNTER — Ambulatory Visit (HOSPITAL_BASED_OUTPATIENT_CLINIC_OR_DEPARTMENT_OTHER)
Admission: RE | Admit: 2016-04-30 | Discharge: 2016-04-30 | Disposition: A | Discharge status: Home / Self Care | Payer: BLUE CROSS/BLUE SHIELD | Source: Ambulatory Visit | Attending: Family Medicine | Admitting: Family Medicine

## 2016-04-30 ENCOUNTER — Encounter: Payer: Self-pay | Admitting: Family Medicine

## 2016-04-30 ENCOUNTER — Ambulatory Visit (INDEPENDENT_AMBULATORY_CARE_PROVIDER_SITE_OTHER): Payer: BLUE CROSS/BLUE SHIELD | Admitting: Family Medicine

## 2016-04-30 ENCOUNTER — Other Ambulatory Visit: Payer: Self-pay | Admitting: Family Medicine

## 2016-04-30 VITALS — BP 137/84 | HR 86

## 2016-04-30 DIAGNOSIS — K449 Diaphragmatic hernia without obstruction or gangrene: Secondary | ICD-10-CM | POA: Insufficient documentation

## 2016-04-30 DIAGNOSIS — R911 Solitary pulmonary nodule: Secondary | ICD-10-CM | POA: Diagnosis not present

## 2016-04-30 DIAGNOSIS — R1032 Left lower quadrant pain: Secondary | ICD-10-CM

## 2016-04-30 DIAGNOSIS — I10 Essential (primary) hypertension: Secondary | ICD-10-CM

## 2016-04-30 DIAGNOSIS — M47896 Other spondylosis, lumbar region: Secondary | ICD-10-CM | POA: Insufficient documentation

## 2016-04-30 DIAGNOSIS — K5730 Diverticulosis of large intestine without perforation or abscess without bleeding: Secondary | ICD-10-CM | POA: Insufficient documentation

## 2016-04-30 DIAGNOSIS — N5089 Other specified disorders of the male genital organs: Secondary | ICD-10-CM

## 2016-04-30 DIAGNOSIS — N509 Disorder of male genital organs, unspecified: Secondary | ICD-10-CM | POA: Diagnosis present

## 2016-04-30 DIAGNOSIS — N433 Hydrocele, unspecified: Secondary | ICD-10-CM | POA: Diagnosis not present

## 2016-04-30 DIAGNOSIS — J984 Other disorders of lung: Secondary | ICD-10-CM | POA: Insufficient documentation

## 2016-04-30 DIAGNOSIS — K4090 Unilateral inguinal hernia, without obstruction or gangrene, not specified as recurrent: Secondary | ICD-10-CM | POA: Diagnosis not present

## 2016-04-30 MED ORDER — IOPAMIDOL (ISOVUE-300) INJECTION 61%
100.0000 mL | Freq: Once | INTRAVENOUS | Status: AC | PRN
  Administered 2016-04-30: 100 mL via INTRAVENOUS

## 2016-04-30 NOTE — Progress Notes (Signed)
Patient arrived today for BP check per Dr Raoul Pitch orders in last OV 04/23/16.  Patient's BP reading was 137/84 with pulse 86.

## 2016-05-01 ENCOUNTER — Ambulatory Visit (HOSPITAL_BASED_OUTPATIENT_CLINIC_OR_DEPARTMENT_OTHER)
Admission: RE | Admit: 2016-05-01 | Discharge: 2016-05-01 | Disposition: A | Discharge status: Home / Self Care | Payer: BLUE CROSS/BLUE SHIELD | Source: Ambulatory Visit | Attending: Family Medicine | Admitting: Family Medicine

## 2016-05-01 ENCOUNTER — Encounter: Payer: Self-pay | Admitting: Family Medicine

## 2016-05-01 DIAGNOSIS — I700 Atherosclerosis of aorta: Secondary | ICD-10-CM | POA: Insufficient documentation

## 2016-05-01 DIAGNOSIS — R911 Solitary pulmonary nodule: Secondary | ICD-10-CM | POA: Diagnosis not present

## 2016-05-01 DIAGNOSIS — J8410 Pulmonary fibrosis, unspecified: Secondary | ICD-10-CM | POA: Insufficient documentation

## 2016-05-01 DIAGNOSIS — I2510 Atherosclerotic heart disease of native coronary artery without angina pectoris: Secondary | ICD-10-CM | POA: Diagnosis not present

## 2016-05-01 DIAGNOSIS — J479 Bronchiectasis, uncomplicated: Secondary | ICD-10-CM | POA: Insufficient documentation

## 2016-05-08 ENCOUNTER — Telehealth: Payer: Self-pay | Admitting: Family Medicine

## 2016-05-08 DIAGNOSIS — N5089 Other specified disorders of the male genital organs: Secondary | ICD-10-CM

## 2016-05-08 DIAGNOSIS — R1032 Left lower quadrant pain: Secondary | ICD-10-CM

## 2016-05-08 DIAGNOSIS — R300 Dysuria: Secondary | ICD-10-CM

## 2016-05-08 DIAGNOSIS — R935 Abnormal findings on diagnostic imaging of other abdominal regions, including retroperitoneum: Secondary | ICD-10-CM

## 2016-05-08 DIAGNOSIS — Z86010 Personal history of colonic polyps: Secondary | ICD-10-CM

## 2016-05-08 NOTE — Telephone Encounter (Signed)
Called pt to discuss imaging studies further. - briefly: repeat CT chest in Nov/Dec will need to be completed for pulm nodule.  - Pt with left scrotal pain: CT resulted with hydrocele, prostate calcification and thickening of urinary bladder. Pt is prior heavy smoker. He also endorsed urinary changes. Discussed referral to urology with pt today and he is amendable  - LLQ pain: Ct scan resulted with colonic diverticula descending colon. Multiple sigmoid colon diverticula are noted. No evidence of acute diverticulitis. Axialimage 78 there is nonspecific mild segmental thickening of proximal sigmoid colon. Pt has a history of colon polyps and is overdue for colonoscopy. Will refer to GI for eval. He is/was a pt of Dr. Deatra Ina.  - he is to continue HCTZ 25 mg and follow up in 2-3 weeks. He will have POCT a1c at that time as well.

## 2016-05-09 ENCOUNTER — Telehealth: Payer: Self-pay | Admitting: Gastroenterology

## 2016-05-09 NOTE — Telephone Encounter (Signed)
Okay to book him as a direct per Dr Havery Moros. Thanks

## 2016-05-09 NOTE — Telephone Encounter (Signed)
The referral is for a routine screening colonoscopy. He is in for a recall in 2018. Okay to do now?

## 2016-05-09 NOTE — Telephone Encounter (Signed)
I reviewed his chart. He is due for a colonoscopy now. He had a large polyp removed in 2008, which led to a 3 year follow up, done in 2011, which was negative. Based on guidelines, given the size of tubulovillous adenoma in 2008 with a negative exam in 2011, he is due at this time and can direct book. Thanks

## 2016-05-10 ENCOUNTER — Encounter: Payer: Self-pay | Admitting: Gastroenterology

## 2016-05-10 NOTE — Telephone Encounter (Signed)
Colonoscopy scheduled.

## 2016-05-16 ENCOUNTER — Encounter: Payer: Self-pay | Admitting: Family Medicine

## 2016-05-16 NOTE — Progress Notes (Signed)
Reviewed nurse visit bp check.  OK.  Signed:  Crissie Sickles, MD           05/16/2016

## 2016-05-22 ENCOUNTER — Other Ambulatory Visit: Payer: Self-pay | Admitting: Family Medicine

## 2016-05-22 ENCOUNTER — Encounter: Payer: Self-pay | Admitting: Family Medicine

## 2016-05-22 ENCOUNTER — Ambulatory Visit (INDEPENDENT_AMBULATORY_CARE_PROVIDER_SITE_OTHER): Payer: BLUE CROSS/BLUE SHIELD | Admitting: Family Medicine

## 2016-05-22 VITALS — BP 153/83 | HR 94 | Temp 98.0°F | Resp 20 | Ht 74.0 in | Wt 228.0 lb

## 2016-05-22 DIAGNOSIS — R1032 Left lower quadrant pain: Secondary | ICD-10-CM | POA: Diagnosis not present

## 2016-05-22 DIAGNOSIS — N509 Disorder of male genital organs, unspecified: Secondary | ICD-10-CM | POA: Diagnosis not present

## 2016-05-22 DIAGNOSIS — M25579 Pain in unspecified ankle and joints of unspecified foot: Secondary | ICD-10-CM | POA: Diagnosis not present

## 2016-05-22 DIAGNOSIS — I10 Essential (primary) hypertension: Secondary | ICD-10-CM | POA: Diagnosis not present

## 2016-05-22 DIAGNOSIS — N5089 Other specified disorders of the male genital organs: Secondary | ICD-10-CM

## 2016-05-22 DIAGNOSIS — M545 Low back pain, unspecified: Secondary | ICD-10-CM | POA: Insufficient documentation

## 2016-05-22 MED ORDER — CYCLOBENZAPRINE HCL 10 MG PO TABS: 10.0000 mg | ORAL_TABLET | Freq: Three times a day (TID) | ORAL | 0 refills | Status: DC | PRN

## 2016-05-22 MED ORDER — LISINOPRIL 5 MG PO TABS: 5.0000 mg | ORAL_TABLET | Freq: Every day | ORAL | 1 refills | Status: DC

## 2016-05-22 MED ORDER — HYDROCHLOROTHIAZIDE 25 MG PO TABS: 25.0000 mg | ORAL_TABLET | Freq: Every day | ORAL | 1 refills | Status: DC

## 2016-05-22 MED ORDER — PREDNISONE 20 MG PO TABS: ORAL_TABLET | ORAL | 0 refills | Status: DC

## 2016-05-22 MED ORDER — HYDROCODONE-ACETAMINOPHEN 5-325 MG PO TABS: 1.0000 | ORAL_TABLET | Freq: Two times a day (BID) | ORAL | 0 refills | Status: DC | PRN

## 2016-05-22 NOTE — Patient Instructions (Signed)
Norco--> for pain every 12 hours as needed Flexeril--> for muscle relaxation, should take at least at night for next 7 days. Prednisone taper for inflammation of back   Blood pressure: continue HCTZ 25 mg daily. Start Lisinopril 5 mg daily. Continue taking BP everyday and writing down.   Follow up in 2 weeks for nurse visit on BP recheck.

## 2016-05-22 NOTE — Progress Notes (Signed)
Patient ID: Nicholas Graham, male  DOB: September 06, 1951, 65 y.o.   MRN: [id number] Patient Care Team    Relationship Specialty Notifications Start End  [name], DO PCP - General Family Medicine  10/31/15   [name], MD Consulting Physician Gastroenterology  04/24/16     Subjective:  Nicholas Graham is a 65 y.o. male present for 1 hypertension left lower quadrant pain, with new complaint of back strain.  Hypertension: Patient reports compliance with HCTZ 25 mg daily. He states he has been taking his blood pressures at home and brings recorded pressures with him today that range between 118-146/76-99 at home. Most of the blood pressures range in the 140s/90s. He denies lower extremity edema, chest pain, shortness of breath or dizziness. He eats a low-salt diet. He is very active outdoors.  Low back strain: Patient has a history of occasional low back strain if he "overdoes it". He had a surgery on his back in 1993 and recent CT imaging showed lumbar degenerative changes, with disc space flattening L4-L5 and L5-S1 levels. Also bone spurring L1-L2 level. He states he was out working in the yard over the weekend and must have strained his back. He states this happens every once in a great while and usually resolves with over-the-counter medications. He reports no resolution in symptoms. He denies any radiation of pain bowel or bladder incontinence.  Left testicular pain/LLQ: Patient confirms that his left lower quadrant tenderness is still present, not improved. He is scheduled to see the gastroenterologist in approximately 4 weeks, with colonoscopy scheduled at the end of October. He also asked the urologist appointment scheduled to follow up on the abnormalities on CT in urinary issues.  Prior note: Patient complains of left nuclear pain today. He is worried because he states he feels a lump in his testicle. He states he has noticed this lump approximally 2-3 months ago, and he feels the  lump is getting larger. He endorses burning with urination, changes in urinary stream, mild urinary frequency, nocturia 3 times on average. He states none of these symptoms existed until approximately 2-3 months ago. Patient has a history of diverticulosis, colon polyp, and inguinal hernia repair on the left side, that needed repeated in 2013. Colonoscopy is overdue, however it is scheduled. He denies any changes in bowel movements, no hematochezia, no melena, no changes in caliber. History of lung cancer in both parents, and bone cancer in his brother. Patient denies fever, chills, night sweats or unintentional weight loss. Patient denies any bone pain. Patient denies any recent trauma or heavy lifting prior to onset of symptoms.     Past Medical History:  Diagnosis Date  . Arm fracture    left. Dx'd w/RSD post fracture  . Carpal tunnel syndrome   . Chest pain 2016   [facility] (Dr. Matthew Saras) doing stress echo, but feels like musculoskeletal chest wall pain is cause of his discomfort  . Diverticulosis   . GERD (gastroesophageal reflux disease)   . Hyperlipidemia   . Nephrolithiasis   . Plantar fasciitis   . Pulmonary nodule 04/2016   [place].  Pt chose to repeat CT chest w/out contrast in 3 mo---as per radiologist's recommendations.  . Right inguinal hernia    fat-containing.  Small hydrocele in left hemiscrotum.  . Tobacco dependence in remission    Allergies  Allergen Reactions  . Celebrex [Celecoxib]   . Cymbalta [Duloxetine Hcl]   . Lyrica [Pregabalin]   . Neurontin [  Gabapentin]   . Penicillins     REACTION: hives   Past Surgical History:  Procedure Laterality Date  . BACK SURGERY  1993  . INGUINAL HERNIA REPAIR Left 2013   repeat hernia repair.   [facility] KNEE ARTHROSCOPY Right    Family History  Problem Relation Age of Onset  . Lung cancer Mother   . Lung cancer Father   . Bone cancer Brother     bone  . Heart disease Maternal Uncle    Social History   Social  History  . Marital status: Married    Spouse name: N/A  . Number of children: N/A  . Years of education: N/A   Occupational History  . Not on file.   Social History Main Topics  . Smoking status: Former Smoker    Packs/day: 3.00    Years: 30.00    Quit date: 07/16/2002  . Smokeless tobacco: Never Used  . Alcohol use 10.8 oz/week    18 Cans of beer per week     Comment: socially  . Drug use: No  . Sexual activity: Yes   Other Topics Concern  . Not on file   Social History Narrative   Married, [name]. Children (2) Adult, 4 grandchildren.    9 th grade, Retired.   Wears seatbelt   Smoke detector in the home.    Firearms locked in the home.    Feels safe in his relationships.      Medication List       Accurate as of 05/22/16 12:26 PM. Always use your most recent med list.          acetaminophen 500 MG tablet Commonly known as:  TYLENOL Take 500 mg by mouth.   aspirin 81 MG tablet Take 81 mg by mouth daily.   cyclobenzaprine 10 MG tablet Commonly known as:  FLEXERIL Take 1 tablet (10 mg total) by mouth 3 (three) times daily as needed for muscle spasms.   hydrochlorothiazide 25 MG tablet Commonly known as:  HYDRODIURIL Take 1 tablet (25 mg total) by mouth daily.   HYDROcodone-acetaminophen 5-325 MG tablet Commonly known as:  NORCO/VICODIN Take 1 tablet by mouth every 12 (twelve) hours as needed for moderate pain.   lisinopril 5 MG tablet Commonly known as:  PRINIVIL,ZESTRIL Take 1 tablet (5 mg total) by mouth daily.   pantoprazole 40 MG tablet Commonly known as:  PROTONIX Take 1 tablet (40 mg total) by mouth daily.   predniSONE 20 MG tablet Commonly known as:  DELTASONE 60 mg x3d, 40 mg x3d, 20 mg x2d, 10 mg x2d   simvastatin 10 MG tablet Commonly known as:  ZOCOR Take 1 tablet (10 mg total) by mouth every other day.        Recent Results (from the past 2160 hour(s))  CBC w/Diff     Status: None   Collection Time: 04/23/16  8:27 AM    Result Value Ref Range   WBC 5.7 4.0 - 10.5 K/uL   RBC 5.14 4.22 - 5.81 Mil/uL   Hemoglobin 16.3 13.0 - 17.0 g/dL   HCT 47.5 39.0 - 52.0 %   MCV 92.4 78.0 - 100.0 fl   MCHC 34.4 30.0 - 36.0 g/dL   RDW 13.1 11.5 - 15.5 %   Platelets 158.0 150.0 - 400.0 K/uL   Neutrophils Relative % 56.2 43.0 - 77.0 %   Lymphocytes Relative 32.9 12.0 - 46.0 %   Monocytes Relative 8.5 3.0 - 12.0 %   Eosinophils  Relative 1.9 0.0 - 5.0 %   Basophils Relative 0.5 0.0 - 3.0 %   Neutro Abs 3.2 1.4 - 7.7 K/uL   Lymphs Abs 1.9 0.7 - 4.0 K/uL   Monocytes Absolute 0.5 0.1 - 1.0 K/uL   Eosinophils Absolute 0.1 0.0 - 0.7 K/uL   Basophils Absolute 0.0 0.0 - 0.1 K/uL  Comp Met (CMET)     Status: Abnormal   Collection Time: 04/23/16  8:27 AM  Result Value Ref Range   Sodium 140 135 - 145 mEq/L   Potassium 4.5 3.5 - 5.1 mEq/L   Chloride 106 96 - 112 mEq/L   CO2 25 19 - 32 mEq/L   Glucose, Bld 112 (H) 70 - 99 mg/dL   BUN 14 6 - 23 mg/dL   Creatinine, Ser 0.95 0.40 - 1.50 mg/dL   Total Bilirubin 0.7 0.2 - 1.2 mg/dL   Alkaline Phosphatase 57 39 - 117 U/L   AST 16 0 - 37 U/L   ALT 16 0 - 53 U/L   Total Protein 7.0 6.0 - 8.3 g/dL   Albumin 4.5 3.5 - 5.2 g/dL   Calcium 9.6 8.4 - 10.5 mg/dL   GFR 85.17 >60.00 mL/min  TSH     Status: None   Collection Time: 04/23/16  8:27 AM  Result Value Ref Range   TSH 2.52 0.35 - 4.50 uIU/mL  Lipid panel     Status: Abnormal   Collection Time: 04/23/16  8:27 AM  Result Value Ref Range   Cholesterol 170 0 - 200 mg/dL    Comment: ATP III Classification       Desirable:  < 200 mg/dL               Borderline High:  200 - 239 mg/dL          High:  > = 240 mg/dL   Triglycerides 164.0 (H) 0.0 - 149.0 mg/dL    Comment: Normal:  <150 mg/dLBorderline High:  150 - 199 mg/dL   HDL 37.80 (L) >39.00 mg/dL   VLDL 32.8 0.0 - 40.0 mg/dL   LDL Cholesterol 100 (H) 0 - 99 mg/dL   Total CHOL/HDL Ratio 5     Comment:                Men          Women1/2 Average Risk     3.4           3.3Average Risk          5.0          4.42X Average Risk          9.6          7.13X Average Risk          15.0          11.0                       NonHDL 132.32     Comment: NOTE:  Non-HDL goal should be 30 mg/dL higher than patient's LDL goal (i.e. LDL goal of < 70 mg/dL, would have non-HDL goal of < 100 mg/dL)  PSA     Status: None   Collection Time: 04/23/16  8:27 AM  Result Value Ref Range   PSA 2.03 0.10 - 4.00 ng/mL  Urine Culture     Status: None   Collection Time: 04/23/16  9:41 AM  Result Value Ref Range   Organism ID,  Bacteria NO GROWTH   Urinalysis, Routine w reflex microscopic (not at [facility], [facility], [facility])     Status: Abnormal   Collection Time: 04/23/16  9:41 AM  Result Value Ref Range   Color, Urine DARK YELLOW YELLOW    Comment: ** Please note change in unit of measure and reference range(s). **      APPearance CLOUDY (A) CLEAR   Specific Gravity, Urine 1.025 1.001 - 1.035   pH 5.5 5.0 - 8.0   Glucose, UA NEGATIVE NEGATIVE   Bilirubin Urine NEGATIVE NEGATIVE   Ketones, ur NEGATIVE NEGATIVE   Hgb urine dipstick NEGATIVE NEGATIVE   Protein, ur 1+ (A) NEGATIVE   Nitrite NEGATIVE NEGATIVE   Leukocytes, UA NEGATIVE NEGATIVE  Urine Microscopic     Status: None   Collection Time: 04/23/16  9:41 AM  Result Value Ref Range   WBC, UA NONE SEEN <=5 WBC/HPF   RBC / HPF 0-2 <=2 RBC/HPF   Squamous Epithelial / LPF NONE SEEN <=5 HPF   Bacteria, UA NONE SEEN NONE SEEN HPF   Crystals NONE SEEN NONE SEEN HPF   Casts NONE SEEN NONE SEEN LPF   Yeast NONE SEEN NONE SEEN HPF     ROS: 14 pt review of systems performed and negative (unless mentioned in an HPI)  Objective: BP (!) 153/83 (BP Location: Right Arm, Patient Position: Sitting, Cuff Size: Large)   Pulse 94   Temp 98 F (36.7 C)   Resp 20   Ht '6\' 2"'  (1.88 m)   Wt 228 lb (103.4 kg)   SpO2 98%   BMI 29.27 kg/m  Gen: Afebrile. No acute distress. Nontoxic in appearance, well-developed, well-nourished,  Caucasian male, pleasant,  Appears uncomfortable today HENT: AT. [place].  MMM Eyes:Pupils Equal Round Reactive to light, Extraocular movements intact,  Conjunctiva without redness, discharge or icterus. CV: RRR no murmur, no edema, +2/4 P posterior tibialis pulses.  Chest: CTAB, no wheeze, rhonchi or crackles. Normal  Respiratory effort. good Air movement. Neuro/Msk: Normal gait. PERLA. EOMi. Alert. Oriented x3.  Cranial nerves II through XII intact. Muscle strength 5/5 upper/lower extremity. DTRs equal bilaterally. Psych: Normal affect, dress and demeanor. Normal speech. Normal thought content and judgment.  Assessment/plan: SHAMAR ENGELMANN is a 65 y.o. male present for CPE. Elevated blood pressure - elevated. Start - hctz 25 mg with 1 week nurse visit to recheck BP.  - BP repeated and remained elevated, although improved.  - Low salt diet. Exercise encouraged.   Back strain:  - Discussed muscle relaxer, steroid burst and short-term narcotic pain prescription. Patient aware pain prescription will not be refilled.  - Flexeril scheduled at least nightly for the next 7 days, may take up to 3 times a day for sedation.  - Continue heat therapy and rest, and then start stretches in one week using pain as his guide.  - Follow-up 2-4 weeks if no improvement.  LLQ pain:  - Left lower quadrant pain still present, patient has follow-up scheduled with both gastroenterology and urology.   Return in about 2 weeks (around 06/05/2016) for nurse visit.  Electronically signed by: Howard Pouch, DO [place]

## 2016-05-28 ENCOUNTER — Ambulatory Visit (AMBULATORY_SURGERY_CENTER): Payer: Self-pay | Admitting: *Deleted

## 2016-05-28 DIAGNOSIS — R1032 Left lower quadrant pain: Secondary | ICD-10-CM

## 2016-05-28 MED ORDER — NA SULFATE-K SULFATE-MG SULF 17.5-3.13-1.6 GM/177ML PO SOLN: 1.0000 | Freq: Once | ORAL | 0 refills | Status: AC

## 2016-05-28 NOTE — Progress Notes (Signed)
No egg or soy allergy. No anesthesia problems.  No home O2.  No diet meds.  

## 2016-06-05 ENCOUNTER — Ambulatory Visit (INDEPENDENT_AMBULATORY_CARE_PROVIDER_SITE_OTHER): Payer: BLUE CROSS/BLUE SHIELD | Admitting: Family Medicine

## 2016-06-05 ENCOUNTER — Encounter: Payer: Self-pay | Admitting: Gastroenterology

## 2016-06-05 DIAGNOSIS — I10 Essential (primary) hypertension: Secondary | ICD-10-CM

## 2016-06-05 NOTE — Progress Notes (Addendum)
Patient presents today for BP check per Dr Raoul Pitch.  Patient's BP is 131/90 today.  Patient brought a list of BP's to show Dr Raoul Pitch.  I gave them to her LPN.  Noted.  Electronically Signed by: Howard Pouch, DO [facility] [place]

## 2016-06-11 ENCOUNTER — Telehealth: Payer: Self-pay | Admitting: Family Medicine

## 2016-06-11 NOTE — Telephone Encounter (Signed)
Patient is having "cotton mouth" & feels like he has to clear his throat when he takes Lisinopril.

## 2016-06-11 NOTE — Telephone Encounter (Signed)
Received message pt is "cotton mouthed" . This could be from his HCTZ making him dry. Make certain he is drinking appropriate water (at least 3- 4 bottles of water a day).  Lisinopril can make a dry tickle in the throat as well.  Try the hydration if worsening or unbearable, would then consider changing medications.

## 2016-06-12 MED ORDER — AMLODIPINE BESYLATE 5 MG PO TABS: 5.0000 mg | ORAL_TABLET | Freq: Every day | ORAL | 0 refills | Status: DC

## 2016-06-12 NOTE — Telephone Encounter (Signed)
Spoke with patient he states he is drinking 8 bottles of water daily . He states about an hour after taking Lisinopril he gets so dry in throat and mouth that he cant hardly talk. He is requesting to discontinue Lisinopril and try something else. Please advise.

## 2016-06-12 NOTE — Telephone Encounter (Signed)
Spoke with patient wife reviewed information and instructions. She will have patient call back to schedule nurse visit. Patient wife verbalized understanding of all instructions.

## 2016-06-12 NOTE — Telephone Encounter (Signed)
Pt to dc lisinopril and start amlodipine 5 mg. He is to monitor BP at home, BP should be between 110-140/60-89, if outside the parameter he needs to call in immediately to discuss. - he is to make a nurse visit next week for BP recheck.

## 2016-06-18 ENCOUNTER — Encounter: Payer: Self-pay | Admitting: Gastroenterology

## 2016-06-18 ENCOUNTER — Ambulatory Visit (AMBULATORY_SURGERY_CENTER): Payer: BLUE CROSS/BLUE SHIELD | Admitting: Gastroenterology

## 2016-06-18 ENCOUNTER — Encounter: Payer: Self-pay | Admitting: Family Medicine

## 2016-06-18 VITALS — BP 113/71 | HR 85 | Temp 98.2°F | Resp 13 | Ht 74.0 in | Wt 228.0 lb

## 2016-06-18 DIAGNOSIS — K621 Rectal polyp: Secondary | ICD-10-CM

## 2016-06-18 DIAGNOSIS — D122 Benign neoplasm of ascending colon: Secondary | ICD-10-CM

## 2016-06-18 DIAGNOSIS — R1032 Left lower quadrant pain: Secondary | ICD-10-CM

## 2016-06-18 DIAGNOSIS — D123 Benign neoplasm of transverse colon: Secondary | ICD-10-CM

## 2016-06-18 MED ORDER — SODIUM CHLORIDE 0.9 % IV SOLN: 500.0000 mL | INTRAVENOUS | Status: DC

## 2016-06-18 NOTE — Op Note (Signed)
[place] Patient Name: Nicholas Graham Procedure Date: 06/18/2016 2:37 PM MRN: [id number] Endoscopist: Remo Lipps P. Havery Moros , MD Age: 65 Referring MD:  Date of Birth: 1951-03-05 Gender: Male Account #: [id number] Procedure:                Colonoscopy Indications:              High risk colon cancer surveillance: Personal                            history of adenoma with villous component Medicines:                Monitored Anesthesia Care Procedure:                Pre-Anesthesia Assessment:                           - Prior to the procedure, a History and Physical                            was performed, and patient medications and                            allergies were reviewed. The patient's tolerance of                            previous anesthesia was also reviewed. The risks                            and benefits of the procedure and the sedation                            options and risks were discussed with the patient.                            All questions were answered, and informed consent                            was obtained. Prior Anticoagulants: The patient has                            taken aspirin, last dose was 1 day prior to                            procedure. ASA Grade Assessment: II - A patient                            with mild systemic disease. After reviewing the                            risks and benefits, the patient was deemed in                            satisfactory condition to undergo the procedure.  After obtaining informed consent, the colonoscope                            was passed under direct vision. Throughout the                            procedure, the patient's blood pressure, pulse, and                            oxygen saturations were monitored continuously. The                            Model CF-HQ190L [id number]) scope was introduced                            through the anus  and advanced to the the cecum,                            identified by appendiceal orifice and ileocecal                            valve. The colonoscopy was performed without                            difficulty. The patient tolerated the procedure                            well. The quality of the bowel preparation was                            good. The ileocecal valve, appendiceal orifice, and                            rectum were photographed. Scope In: 2:43:49 PM Scope Out: 3:03:39 PM Scope Withdrawal Time: 0 hours 16 minutes 18 seconds  Total Procedure Duration: 0 hours 19 minutes 50 seconds  Findings:                 The perianal and digital rectal examinations were                            normal.                           A 5 mm polyp was found in the ascending colon. The                            polyp was sessile. The polyp was removed with a                            cold snare. Resection and retrieval were complete.                           A 5 mm polyp was found in the transverse colon. The  polyp was sessile. The polyp was removed with a                            cold snare. Resection and retrieval were complete.                           A 5 mm polyp was found in the rectum. The polyp was                            sessile. The polyp was removed with a cold snare.                            Resection and retrieval were complete.                           Multiple medium-mouthed diverticula were found in                            the left colon.                           Non-bleeding internal hemorrhoids were found during                            retroflexion.                           The exam was otherwise without abnormality. Complications:            No immediate complications. Estimated blood loss:                            Minimal. Estimated Blood Loss:     Estimated blood loss was minimal. Impression:               - One 5  mm polyp in the ascending colon, removed                            with a cold snare. Resected and retrieved.                           - One 5 mm polyp in the transverse colon, removed                            with a cold snare. Resected and retrieved.                           - One 5 mm polyp in the rectum, removed with a cold                            snare. Resected and retrieved.                           - Diverticulosis in the left colon.                           -  Non-bleeding internal hemorrhoids.                           - The examination was otherwise normal. Recommendation:           - Patient has a contact number available for                            emergencies. The signs and symptoms of potential                            delayed complications were discussed with the                            patient. Return to normal activities tomorrow.                            Written discharge instructions were provided to the                            patient.                           - Resume previous diet.                           - Continue present medications.                           - No ibuprofen, naproxen, or other non-steroidal                            anti-inflammatory drugs for 2 weeks after polyp                            removal.                           - Await pathology results.                           - Repeat colonoscopy is recommended for                            surveillance. The colonoscopy date will be                            determined after pathology results from today's                            exam become available for review. Remo Lipps P. Georgina Krist, MD 06/18/2016 3:09:19 PM This report has been signed electronically.

## 2016-06-18 NOTE — Progress Notes (Signed)
Called to room to assist during endoscopic procedure.  Patient ID and intended procedure confirmed with present staff. Received instructions for my participation in the procedure from the performing physician.  

## 2016-06-18 NOTE — Progress Notes (Signed)
A/ox3, pleased with MAC, report to RN 

## 2016-06-18 NOTE — Patient Instructions (Signed)
Colon polyps removed today, handouts given on polyps, diverticulosis, hemorrhoids. Result letter in your mail in 2-3 weeks. Resume current medications. DO NOT take any ibuprofen, naproxen, or any other non-steriodal anti-inflammatory medications for 2 weeks! Call us with any questions or concerns. Thank you!  YOU HAD AN ENDOSCOPIC PROCEDURE TODAY AT [facility]:   Refer to the procedure report that was given to you for any specific questions about what was found during the examination.  If the procedure report does not answer your questions, please call your gastroenterologist to clarify.  If you requested that your care partner not be given the details of your procedure findings, then the procedure report has been included in a sealed envelope for you to review at your convenience later.  YOU SHOULD EXPECT: Some feelings of bloating in the abdomen. Passage of more gas than usual.  Walking can help get rid of the air that was put into your GI tract during the procedure and reduce the bloating. If you had a lower endoscopy (such as a colonoscopy or flexible sigmoidoscopy) you may notice spotting of blood in your stool or on the toilet paper. If you underwent a bowel prep for your procedure, you may not have a normal bowel movement for a few days.  Please Note:  You might notice some irritation and congestion in your nose or some drainage.  This is from the oxygen used during your procedure.  There is no need for concern and it should clear up in a day or so.  SYMPTOMS TO REPORT IMMEDIATELY:   Following lower endoscopy (colonoscopy or flexible sigmoidoscopy):  Excessive amounts of blood in the stool  Significant tenderness or worsening of abdominal pains  Swelling of the abdomen that is new, acute  Fever of 100F or higher  For urgent or emergent issues, a gastroenterologist can be reached at any hour by calling [phone].   DIET:  We do recommend a small meal at first, but  then you may proceed to your regular diet.  Drink plenty of fluids but you should avoid alcoholic beverages for 24 hours.  ACTIVITY:  You should plan to take it easy for the rest of today and you should NOT DRIVE or use heavy machinery until tomorrow (because of the sedation medicines used during the test).    FOLLOW UP: Our staff will call the number listed on your records the next business day following your procedure to check on you and address any questions or concerns that you may have regarding the information given to you following your procedure. If we do not reach you, we will leave a message.  However, if you are feeling well and you are not experiencing any problems, there is no need to return our call.  We will assume that you have returned to your regular daily activities without incident.  If any biopsies were taken you will be contacted by phone or by letter within the next 1-3 weeks.  Please call us at [phone] if you have not heard about the biopsies in 3 weeks.    SIGNATURES/CONFIDENTIALITY: You and/or your care partner have signed paperwork which will be entered into your electronic medical record.  These signatures attest to the fact that that the information above on your After Visit Summary has been reviewed and is understood.  Full responsibility of the confidentiality of this discharge information lies with you and/or your care-partner.

## 2016-06-19 ENCOUNTER — Telehealth: Payer: Self-pay | Admitting: *Deleted

## 2016-06-19 NOTE — Telephone Encounter (Signed)
  Follow up Call-  Call back number 06/18/2016  Post procedure Call Back phone  # [phone]  Permission to leave phone message Yes  Some recent data might be hidden     Patient questions:  Do you have a fever, pain , or abdominal swelling? No. Pain Score  0 *  Have you tolerated food without any problems? Yes.    Have you been able to return to your normal activities? Yes.    Do you have any questions about your discharge instructions: Diet   No. Medications  No. Follow up visit  No.  Do you have questions or concerns about your Care? No.  Actions: * If pain score is 4 or above: No action needed, pain <4.

## 2016-06-20 ENCOUNTER — Ambulatory Visit (INDEPENDENT_AMBULATORY_CARE_PROVIDER_SITE_OTHER): Payer: BLUE CROSS/BLUE SHIELD | Admitting: *Deleted

## 2016-06-20 ENCOUNTER — Other Ambulatory Visit: Payer: Self-pay | Admitting: Family Medicine

## 2016-06-20 VITALS — BP 115/74 | HR 76

## 2016-06-20 DIAGNOSIS — I10 Essential (primary) hypertension: Secondary | ICD-10-CM | POA: Diagnosis not present

## 2016-06-20 MED ORDER — AMLODIPINE BESYLATE 5 MG PO TABS: 5.0000 mg | ORAL_TABLET | Freq: Every day | ORAL | 1 refills | Status: DC

## 2016-06-20 NOTE — Progress Notes (Signed)
BP 115/74 (BP Location: Left Arm, Patient Position: Sitting, Cuff Size: Large)   Pulse 76  BP noted, pt to continue current regimen, F/U 3 mos

## 2016-06-20 NOTE — Progress Notes (Signed)
Refills of amlodopine prescribed.

## 2016-06-20 NOTE — Progress Notes (Signed)
Patient presents today for BP check. Vital signs within normal parameters. Dr Raoul Pitch aware. Patient to follow up in 3 months for HTN.

## 2016-06-27 ENCOUNTER — Encounter: Payer: Self-pay | Admitting: Gastroenterology

## 2016-06-29 ENCOUNTER — Telehealth: Payer: Self-pay | Admitting: Family Medicine

## 2016-06-29 NOTE — Telephone Encounter (Signed)
Patient had a knot that he wanted to have removed. He said Dr. Raoul Pitch looked at it during his appointment & advised it could be done here in our office. Patient also has a couple of skin tags that he would like to remove. Patient is asking if the knot and the skin tags will be frozen off or cut off. He wants to check with his insurance company to see if they will cover it. Please call patient Monday 10/16

## 2016-07-02 NOTE — Telephone Encounter (Signed)
Left message with detailed information on patient voice mail.

## 2016-07-02 NOTE — Telephone Encounter (Signed)
It is a skin lesion and would be removed by excision.

## 2016-07-05 ENCOUNTER — Other Ambulatory Visit: Payer: Self-pay | Admitting: Family Medicine

## 2016-07-05 DIAGNOSIS — I10 Essential (primary) hypertension: Secondary | ICD-10-CM

## 2016-07-09 ENCOUNTER — Ambulatory Visit (INDEPENDENT_AMBULATORY_CARE_PROVIDER_SITE_OTHER): Payer: BLUE CROSS/BLUE SHIELD | Admitting: Family Medicine

## 2016-07-09 ENCOUNTER — Encounter: Payer: Self-pay | Admitting: Family Medicine

## 2016-07-09 VITALS — BP 146/89 | HR 98 | Temp 97.9°F | Resp 20 | Wt 225.2 lb

## 2016-07-09 DIAGNOSIS — L989 Disorder of the skin and subcutaneous tissue, unspecified: Secondary | ICD-10-CM

## 2016-07-09 DIAGNOSIS — L918 Other hypertrophic disorders of the skin: Secondary | ICD-10-CM | POA: Diagnosis not present

## 2016-07-09 NOTE — Progress Notes (Signed)
Nicholas Graham , [birth year], 65 y.o., male MRN: [id number] Patient Care Team    Relationship Specialty Notifications Start End  [name], DO PCP - General Family Medicine  10/31/15   [name], MD Consulting Physician Gastroenterology  04/24/16     CC: lesion removal Subjective: Pt presents for OV for right posterior skin lesion removal and right axilla skin tag removal. Skin tag is bothersome and consistently becomes caught in his clothing. Right thigh lesion never fully "heals" and scabs over.   Allergies  Allergen Reactions  . Celebrex [Celecoxib]   . Cymbalta [Duloxetine Hcl]   . Lyrica [Pregabalin]   . Neurontin [Gabapentin]   . Penicillins     REACTION: hives   Social History  Substance Use Topics  . Smoking status: Former Smoker    Packs/day: 3.00    Years: 30.00    Quit date: 07/16/2002  . Smokeless tobacco: Never Used  . Alcohol use 10.8 oz/week    18 Cans of beer per week     Comment: socially   Past Medical History:  Diagnosis Date  . Arm fracture    left. Dx'd w/RSD post fracture  . Carpal tunnel syndrome   . Chest pain 2016   [facility] (Dr. Matthew Saras) doing stress echo, but feels like musculoskeletal chest wall pain is cause of his discomfort  . Diverticulosis   . GERD (gastroesophageal reflux disease)   . Hyperlipidemia   . Hypertension   . Nephrolithiasis   . Plantar fasciitis   . Pulmonary nodule 04/2016   [place].  Pt chose to repeat CT chest w/out contrast in 3 mo---as per radiologist's recommendations.  . Right inguinal hernia    fat-containing.  Small hydrocele in left hemiscrotum.  . Tobacco dependence in remission    Past Surgical History:  Procedure Laterality Date  . BACK SURGERY  1993  . COLONOSCOPY    . INGUINAL HERNIA REPAIR Left 2013   repeat hernia repair. x2  . KNEE ARTHROSCOPY Right   . UPPER GI ENDOSCOPY     Family History  Problem Relation Age of Onset  . Lung cancer Mother   . Lung cancer Father     . Bone cancer Brother     bone  . Heart disease Maternal Uncle   . Colon cancer Neg Hx      Medication List       Accurate as of 07/09/16  3:43 PM. Always use your most recent med list.          acetaminophen 500 MG tablet Commonly known as:  TYLENOL Take 500 mg by mouth.   amLODipine 5 MG tablet Commonly known as:  NORVASC Take 1 tablet (5 mg total) by mouth daily.   aspirin 81 MG tablet Take 81 mg by mouth daily.   cyclobenzaprine 10 MG tablet Commonly known as:  FLEXERIL Take 1 tablet (10 mg total) by mouth 3 (three) times daily as needed for muscle spasms.   hydrochlorothiazide 25 MG tablet Commonly known as:  HYDRODIURIL Take 1 tablet (25 mg total) by mouth daily.   pantoprazole 40 MG tablet Commonly known as:  PROTONIX Take 1 tablet (40 mg total) by mouth daily.   simvastatin 10 MG tablet Commonly known as:  ZOCOR Take 1 tablet (10 mg total) by mouth every other day.       No results found for this or any previous visit (from the past 24 hour(s)). No results found.   ROS: Negative,  with the exception of above mentioned in HPI   Objective:  BP (!) 146/89 (BP Location: Left Arm, Patient Position: Sitting, Cuff Size: Large)   Pulse 98   Temp 97.9 F (36.6 C)   Resp 20   Wt 225 lb 4 oz (102.2 kg)   SpO2 98%   BMI 28.92 kg/m  Body mass index is 28.92 kg/m. Gen: Afebrile. No acute distress. Nontoxic in appearance, well developed, well nourished. Pleasant caucasian male.  HENT: AT. [place]. MMM, no oral lesions. Eyes:Pupils Equal Round Reactive to light, Extraocular movements intact,  Conjunctiva without redness, discharge or icterus. Skin: 0.5 x 0.6 cm flesh toned raised lesion right posterior thigh. Right <1 mm right axilla skin tag.    Assessment/Plan: Nicholas Graham is a 65 y.o. male present for procedure OV for skin lesion removal.  Procedure Note:  PROCEDURE NOTE: Punch biopsy Performed by: Dr. Raoul Pitch Indication: pain, nonhealing skin  lesion  pathology Anesthesia was obtained with 1 ml of 1% lidocaine with lidocaine. The area was prepped in the usual sterile fashion. A 4 mm punch biopsy was used, lesion excised fully and sent to pathology.  Silver nitrate was used to seal small area of bleeding. Right axilla skin tag removed with scissors. A pressure dressing was placed over the site with antibiotic ointment. The patient tolerated the procedure well. Wound care instructions were given. Patient to follow up tomorrow if any concerns, otherwise no followup needed as long as healing well and without signs of infection.  Post-Procedure Diagnosis: skin lesion Complications: None Estimated Blood Loss: minimal    electronically signed by:  Howard Pouch, DO  [place]

## 2016-07-09 NOTE — Patient Instructions (Signed)
Keep area clean and dry. Regular shower water/soap is good. Area may bleed a little after numbing medicine start to wear off. Just keep some pressure on the area for about 10  Minutes if this occurs.  Apply ointment and fresh band aid daily for the next few days, until area is completely scabbed over.

## 2016-07-16 ENCOUNTER — Telehealth: Payer: Self-pay | Admitting: Family Medicine

## 2016-07-16 NOTE — Telephone Encounter (Signed)
Spoke with Juliann Pulse patients wife reviewed results. She verbalized understanding.

## 2016-07-16 NOTE — Telephone Encounter (Signed)
Please call Nicholas Graham: - His pathology report from his skin biposy was normal.

## 2016-08-06 ENCOUNTER — Other Ambulatory Visit: Payer: Self-pay | Admitting: Family Medicine

## 2016-10-03 ENCOUNTER — Ambulatory Visit: Payer: BLUE CROSS/BLUE SHIELD | Admitting: Family Medicine

## 2016-10-08 ENCOUNTER — Ambulatory Visit (INDEPENDENT_AMBULATORY_CARE_PROVIDER_SITE_OTHER): Payer: BLUE CROSS/BLUE SHIELD | Admitting: Family Medicine

## 2016-10-08 ENCOUNTER — Encounter: Payer: Self-pay | Admitting: Family Medicine

## 2016-10-08 ENCOUNTER — Telehealth: Payer: Self-pay | Admitting: Family Medicine

## 2016-10-08 VITALS — BP 138/82 | HR 84 | Temp 98.0°F | Resp 20 | Ht 74.0 in | Wt 228.0 lb

## 2016-10-08 DIAGNOSIS — H6121 Impacted cerumen, right ear: Secondary | ICD-10-CM | POA: Diagnosis not present

## 2016-10-08 DIAGNOSIS — E785 Hyperlipidemia, unspecified: Secondary | ICD-10-CM

## 2016-10-08 DIAGNOSIS — I10 Essential (primary) hypertension: Secondary | ICD-10-CM | POA: Diagnosis not present

## 2016-10-08 LAB — LIPID PANEL
CHOL/HDL RATIO: 5
Cholesterol: 187 mg/dL (ref 0–200)
HDL: 37.3 mg/dL — ABNORMAL LOW (ref >39.00)
LDL Cholesterol: 112 mg/dL — ABNORMAL HIGH (ref 0–99)
NONHDL: 149.7
Triglycerides: 188 mg/dL — ABNORMAL HIGH (ref 0.0–149.0)
VLDL: 37.6 mg/dL (ref 0.0–40.0)

## 2016-10-08 MED ORDER — HYDROCHLOROTHIAZIDE 25 MG PO TABS: 25.0000 mg | ORAL_TABLET | Freq: Every day | ORAL | 1 refills | Status: DC

## 2016-10-08 MED ORDER — CARBAMIDE PEROXIDE 6.5 % OT SOLN: 5.0000 [drp] | Freq: Two times a day (BID) | OTIC | 0 refills | Status: DC

## 2016-10-08 MED ORDER — AMLODIPINE BESYLATE 5 MG PO TABS
5.0000 mg | ORAL_TABLET | Freq: Every day | ORAL | 1 refills | Status: DC
Start: 2016-10-08 — End: 2017-02-19

## 2016-10-08 MED ORDER — SIMVASTATIN 10 MG PO TABS: ORAL_TABLET | ORAL | 11 refills | Status: DC

## 2016-10-08 NOTE — Progress Notes (Signed)
Patient ID: Nicholas Graham, male  DOB: March 30, 1951, 66 y.o.   MRN: [id number] Patient Care Team    Relationship Specialty Notifications Start End  [name], DO PCP - General Family Medicine  10/31/15   [name], MD Consulting Physician Gastroenterology  04/24/16     Subjective:  Nicholas Graham is a 66 y.o. male present for  hypertension and hyperlipidemia  Hypertension: Pt presents for follow up on HTN today. He is not checking BP at home. He denies chest pain, shortness of breath or LE edema. He is complaint with HCTZ 25 mg QD and amlodipine 5 mg QD. He takes a daily baby ASA.   Hyperlipidemia: Pt reports he was unable to take the fish oil supplement secondary to after taste. He is compliant with zocor 10 mg QOD. He denies myalgias on this lower dosing.   Right ear discomfort: Pt reports the last couple of days he has had right inner ear discomfort. He has been putting an OTC "Oil" in his ear, without great resolution. He denies fever, chills, URI sx, tinnitus or hearing loss.   Past Medical History:  Diagnosis Date  . Arm fracture    left. Dx'd w/RSD post fracture  . Carpal tunnel syndrome   . Chest pain 2016   [facility] (Dr. Matthew Saras) doing stress echo, but feels like musculoskeletal chest wall pain is cause of his discomfort  . Diverticulosis   . GERD (gastroesophageal reflux disease)   . Hyperlipidemia   . Hypertension   . Nephrolithiasis   . Plantar fasciitis   . Pulmonary nodule 04/2016   [place].  Pt chose to repeat CT chest w/out contrast in 3 mo---as per radiologist's recommendations.  . Right inguinal hernia    fat-containing.  Small hydrocele in left hemiscrotum.  . Tobacco dependence in remission    Allergies  Allergen Reactions  . Celebrex [Celecoxib]   . Cymbalta [Duloxetine Hcl]   . Lyrica [Pregabalin]   . Neurontin [Gabapentin]   . Penicillins     REACTION: hives   Past Surgical History:  Procedure Laterality Date  . BACK  SURGERY  1993  . COLONOSCOPY    . INGUINAL HERNIA REPAIR Left 2013   repeat hernia repair. x2  . KNEE ARTHROSCOPY Right   . UPPER GI ENDOSCOPY     Family History  Problem Relation Age of Onset  . Lung cancer Mother   . Lung cancer Father   . Bone cancer Brother     bone  . Heart disease Maternal Uncle   . Colon cancer Neg Hx    Social History   Social History  . Marital status: Married    Spouse name: N/A  . Number of children: N/A  . Years of education: N/A   Occupational History  . Not on file.   Social History Main Topics  . Smoking status: Former Smoker    Packs/day: 3.00    Years: 30.00    Quit date: 07/16/2002  . Smokeless tobacco: Never Used  . Alcohol use 10.8 oz/week    18 Cans of beer per week     Comment: socially  . Drug use: No  . Sexual activity: Yes   Other Topics Concern  . Not on file   Social History Narrative   Married, [name]. Children (2) Adult, 4 grandchildren.    9 th grade, Retired.   Wears seatbelt   Smoke detector in the home.    Firearms locked in  the home.    Feels safe in his relationships.    Allergies as of 10/08/2016      Reactions   Celebrex [celecoxib]    Cymbalta [duloxetine Hcl]    Lyrica [pregabalin]    Neurontin [gabapentin]    Penicillins    REACTION: hives      Medication List       Accurate as of 10/08/16 12:10 PM. Always use your most recent med list.          acetaminophen 500 MG tablet Commonly known as:  TYLENOL Take 500 mg by mouth.   amLODipine 5 MG tablet Commonly known as:  NORVASC Take 1 tablet (5 mg total) by mouth daily.   aspirin 81 MG tablet Take 81 mg by mouth daily.   carbamide peroxide 6.5 % otic solution Commonly known as:  DEBROX Place 5 drops into the right ear 2 (two) times daily.   cyclobenzaprine 10 MG tablet Commonly known as:  FLEXERIL Take 1 tablet (10 mg total) by mouth 3 (three) times daily as needed for muscle spasms.   hydrochlorothiazide 25 MG tablet Commonly  known as:  HYDRODIURIL Take 1 tablet (25 mg total) by mouth daily.   pantoprazole 40 MG tablet Commonly known as:  PROTONIX Take 1 tablet (40 mg total) by mouth daily.   simvastatin 10 MG tablet Commonly known as:  ZOCOR Take 1 tablet (10 mg total) by mouth every other day.   tamsulosin 0.4 MG Caps capsule Commonly known as:  FLOMAX Take 0.4 mg by mouth at bedtime.        No results found for this or any previous visit (from the past 2160 hour(s)).   ROS: 14 pt review of systems performed and negative (unless mentioned in an HPI)  Objective: BP 138/82 (BP Location: Left Arm, Patient Position: Sitting, Cuff Size: Large)   Pulse 84   Temp 98 F (36.7 C)   Resp 20   Ht 6\' 2"  (1.88 m)   Wt 228 lb (103.4 kg)   SpO2 99%   BMI 29.27 kg/m   Gen: Afebrile. No acute distress.  HENT: AT. [place]. MMM. Bilateral TM visualized and normal in appearence. Small amount of cerumen and a hair in canal.  Eyes:Pupils Equal Round Reactive to light, Extraocular movements intact,  Conjunctiva without redness, discharge or icterus. CV: RRR, no edema, +2/4 P posterior tibialis pulses Chest: CTAB, no wheeze or crackles Abd: Soft. NTND. BS present.  Neuro:  Normal gait. PERLA. EOMi. Alert. Oriented.    Assessment/plan: Nicholas Graham is a 66 y.o. male present for CPE. Essential hypertension, benign - continue amlodipine and HCTZ. BP stable today.  - low salt diet, exercise > 150 m a week.  - Amlodipine refills 90 d with additional refill.  - hydrochlorothiazide (HYDRODIURIL) 25 MG tablet; Take 1 tablet (25 mg total) by mouth daily.  Dispense: 90 tablet; Refill: 1 - F/U 6 months.   Dyslipidemia - continue zocor, refills provided.  - Lipid panel - Follow yearly.   Cerumen debris on tympanic membrane of right ear - New - ear discomfort may be secondary to small amount of cerumen and a long hair back by TM. Otherwise, exam was normal. Debrox ear drops prescribed with instructions.  -  carbamide peroxide (DEBROX) 6.5 % otic solution; Place 5 drops into the right ear 2 (two) times daily.  Dispense: 15 mL; Refill: 0 - F/U if not improved or worsening symptoms.    Return in about 6 months (  around 04/07/2017), or HTN.  Electronically signed by: Howard Pouch, DO [place]

## 2016-10-08 NOTE — Patient Instructions (Signed)
It was a pleasure seeing you today.  Follow up on chronic issues (hypertension every 6 months) Yearly physical   Your BP looked good today. I called in ear solution to help flush ear.    Please help [place] help you:  It is a privilege to be able to take care of great patients such as yourself. We are honored you have chosen [facility] for your Primary Care home. Below you will find basic instructions that you may need to access in the future. Please help [place] help you by reading the instructions, which cover many of the frequent questions we experience.   Prescription refills and request:  -In order to allow more efficient response time, please call your pharmacy for all refills. They will forward the request electronically to [place]. This allows for the quickest possible response. Request left on a nurse line can take longer to refill, since these are checked as time allows between office patients and other phone calls.  - refill request can take up to 3-5 working days to complete.  - If request is sent electronically and request is appropiate, it is usually completed in 1-2 business days.  - all patients will need to be seen routinely for all chronic medical conditions requiring prescription medications (see follow-up below). If you are overdue for follow up on your condition, you will be asked to make an appointment and we will call in enough medication to cover you until your appointment (up to 30 days).  - all controlled substances will require a face to face visit to request/refill.  - if you desire your prescriptions to go through a new pharmacy, and have an active script at original pharmacy, you will need to call your pharmacy and have scripts transferred to new pharmacy. This is completed between the pharmacy locations and not by your provider.    Results: If any images or labs were ordered, it can take up to 1 week to get results depending on the test ordered and the lab/facility running  and resulting the test. - Normal or stable results, which do not need further discussion, will be released to your mychart immediately with attached note to you. A call will not be generated for normal results. Please make certain to sign up for mychart. If you have questions on how to activate your mychart you can call the front office.  - If your results need further discussion, our office will attempt to contact you via phone, and if unable to reach you after 2 attempts, we will release your abnormal result to your mychart with instructions.  - All results will be automatically released in mychart after 1 week.  - Your provider will provide you with explanation and instruction on all relevant material in your results. Please keep in mind, results and labs may appear confusing or abnormal to the untrained eye, but it does not mean they are actually abnormal for you personally. If you have any questions about your results that are not covered, or you desire more detailed explanation than what was provided, you should make an appointment with your provider to do so.   Our office handles many outgoing and incoming calls daily. If we have not contacted you within 1 week about your results, please check your mychart to see if there is a message first and if not, then contact our office.  In helping with this matter, you help decrease call volume, and therefore allow [place] to be able to respond  to patients needs more efficiently.   Acute office visits (sick visit):  An acute visit is intended for a new problem and are scheduled in shorter time slots to allow schedule openings for patients with new problems. This is the appropriate visit to discuss a new problem. In order to provide you with excellent quality medical care with proper time for you to explain your problem, have an exam and receive treatment with instructions, these appointments should be limited to one new problem per visit. If you experience a new  problem, in which you desire to be addressed, please make an acute office visit, we save openings on the schedule to accommodate you. Please do not save your new problem for any other type of visit, let us take care of it properly and quickly for you.   Follow up visits:  Depending on your condition(s) your provider will need to see you routinely in order to provide you with quality care and prescribe medication(s). Most chronic conditions (Example: hypertension, Diabetes, depression/anxiety... etc), require visits a couple times a year. Your provider will instruct you on proper follow up for your personal medical conditions and history. Please make certain to make follow up appointments for your condition as instructed. Failing to do so could result in lapse in your medication treatment/refills. If you request a refill, and are overdue to be seen on a condition, we will always provide you with a 30 day script (once) to allow you time to schedule.    Medicare wellness (well visit): - we have a wonderful Nurse Maudie Mercury), that will meet with you and provide you will yearly medicare wellness visits. These visits should occur yearly (can not be scheduled less than 1 calendar year apart) and cover preventive health, immunizations, advance directives and screenings you are entitled to yearly through your medicare benefits. Do not miss out on your entitled benefits, this is when medicare will pay for these benefits to be ordered for you.  These are strongly encouraged by your provider and is the appropriate type of visit to make certain you are up to date with all preventive health benefits. If you have not had your medicare wellness exam in the last 12 months, please make certain to schedule one by calling the office and schedule your medicare wellness with Maudie Mercury as soon as possible.   Yearly physical (well visit):  - Adults are recommended to be seen yearly for physicals. Check with your insurance and date of your  last physical, most insurances require one calendar year between physicals. Physicals include all preventive health topics, screenings, medical exam and labs that are appropriate for gender/age and history. You may have fasting labs needed at this visit. This is a well visit (not a sick visit), acute topics should not be covered during this visit.  - Pediatric patients are seen more frequently when they are younger. Your provider will advise you on well child visit timing that is appropriate for your their age. - This is not a medicare wellness visit. Medicare wellness exams do not have an exam portion to the visit. Some medicare companies allow for a physical, some do not allow a yearly physical. If your medicare allows a yearly physical you can schedule the medicare wellness with our nurse Maudie Mercury and have your physical with your provider after, on the same day. Please check with insurance for your full benefits.   Late Policy/No Shows:  - all new patients should arrive 15-30 minutes earlier than appointment to allow  [place] time  to  obtain all personal demographics,  insurance information and for you to complete office paperwork. - All established patients should arrive 10-15 minutes earlier than appointment time to update all information and be checked in .  - In our best efforts to run on time, if you are late for your appointment you will be asked to either reschedule or if able, we will work you back into the schedule. There will be a wait time to work you back in the schedule,  depending on availability.  - If you are unable to make it to your appointment as scheduled, please call 24 hours ahead of time to allow [place] to fill the time slot with someone else who needs to be seen. If you do not cancel your appointment ahead of time, you may be charged a no show fee.

## 2016-10-08 NOTE — Telephone Encounter (Signed)
Please call pt: His cholesterol looks unchanged. It is not bad, just mildly elevated triglycerides. Since he is on Zocor, and unable to tolerate fish oil, he could also try to consume less saturated fats and increase good fats such as in avocado, olive oil and fishes like tuna and salmon.  Would follow with yearly physical only.    Lipid Panel     Component Value Date/Time   CHOL 187 10/08/2016 0913   TRIG 188.0 (H) 10/08/2016 0913   HDL 37.30 (L) 10/08/2016 0913   CHOLHDL 5 10/08/2016 0913   VLDL 37.6 10/08/2016 0913   LDLCALC 112 (H) 10/08/2016 0913

## 2016-10-09 NOTE — Telephone Encounter (Signed)
Spoke with patients wife reviewed lab results and instructions. Patient wife verbalized understanding and will relay information to patient.

## 2017-01-09 ENCOUNTER — Other Ambulatory Visit: Payer: Self-pay | Admitting: Family Medicine

## 2017-01-09 DIAGNOSIS — I10 Essential (primary) hypertension: Secondary | ICD-10-CM

## 2017-02-19 ENCOUNTER — Other Ambulatory Visit: Payer: Self-pay | Admitting: Family Medicine

## 2017-02-19 DIAGNOSIS — I10 Essential (primary) hypertension: Secondary | ICD-10-CM

## 2017-03-18 ENCOUNTER — Other Ambulatory Visit: Payer: Self-pay | Admitting: Family Medicine

## 2017-03-18 DIAGNOSIS — I10 Essential (primary) hypertension: Secondary | ICD-10-CM

## 2017-04-08 ENCOUNTER — Encounter: Payer: Self-pay | Admitting: Family Medicine

## 2017-04-08 ENCOUNTER — Ambulatory Visit (INDEPENDENT_AMBULATORY_CARE_PROVIDER_SITE_OTHER): Payer: BLUE CROSS/BLUE SHIELD | Admitting: Family Medicine

## 2017-04-08 VITALS — BP 137/86 | HR 100 | Temp 97.4°F | Resp 20 | Ht 74.0 in | Wt 225.8 lb

## 2017-04-08 DIAGNOSIS — K2100 Gastro-esophageal reflux disease with esophagitis, without bleeding: Secondary | ICD-10-CM

## 2017-04-08 DIAGNOSIS — I10 Essential (primary) hypertension: Secondary | ICD-10-CM

## 2017-04-08 DIAGNOSIS — E785 Hyperlipidemia, unspecified: Secondary | ICD-10-CM | POA: Diagnosis not present

## 2017-04-08 MED ORDER — HYDROCHLOROTHIAZIDE 50 MG PO TABS: 50.0000 mg | ORAL_TABLET | Freq: Every day | ORAL | 1 refills | Status: DC

## 2017-04-08 MED ORDER — AMLODIPINE BESYLATE 5 MG PO TABS
5.0000 mg | ORAL_TABLET | Freq: Every day | ORAL | 1 refills | Status: DC
Start: 2017-04-08 — End: 2017-04-12

## 2017-04-08 MED ORDER — PANTOPRAZOLE SODIUM 40 MG PO TBEC: 40.0000 mg | DELAYED_RELEASE_TABLET | Freq: Every day | ORAL | 3 refills | Status: DC

## 2017-04-08 NOTE — Progress Notes (Signed)
Patient ID: Nicholas Graham, male  DOB: December 28, 1950, 66 y.o.   MRN: [id number] Patient Care Team    Relationship Specialty Notifications Start End  [name], DO PCP - General Family Medicine  10/31/15   [name], MD Consulting Physician Gastroenterology  04/24/16     Subjective:  Nicholas Graham is a 66 y.o. male present for  hypertension and hyperlipidemia  Hypertension:  Pt reports compliance with Amlodipine 5 mg and HCTZ 25 mg. Blood pressures ranges at home are not checked. Patient denies chest pain, shortness of breath or lower extremity edema. Pt takes daily baby ASA. Pt is prescribed statin. BMP: 04/23/2016 (mildly elevated glucose) CBC: 04/23/2016; WNL Lipid: 10/08/2016--> zocor QOD Diet: Low sodium Exercise: Has been walking 4.5 miles a day. RF: HTN, HLD, Fhx HD, Former smoker  GERD: Pt still using Prilosec intermittently. In need of refills today.   Past Medical History:  Diagnosis Date  . Arm fracture    left. Dx'd w/RSD post fracture  . Carpal tunnel syndrome   . Chest pain 2016   [facility] (Dr. Matthew Saras) doing stress echo, but feels like musculoskeletal chest wall pain is cause of his discomfort  . Diverticulosis   . GERD (gastroesophageal reflux disease)   . Hyperlipidemia   . Hypertension   . Nephrolithiasis   . Plantar fasciitis   . Pulmonary nodule 04/2016   [place].  Pt chose to repeat CT chest w/out contrast in 3 mo---as per radiologist's recommendations.  . Right inguinal hernia    fat-containing.  Small hydrocele in left hemiscrotum.  . Tobacco dependence in remission    Allergies  Allergen Reactions  . Celebrex [Celecoxib]   . Cymbalta [Duloxetine Hcl]   . Lyrica [Pregabalin]   . Neurontin [Gabapentin]   . Penicillins     REACTION: hives   Past Surgical History:  Procedure Laterality Date  . BACK SURGERY  1993  . COLONOSCOPY    . INGUINAL HERNIA REPAIR Left 2013   repeat hernia repair. x2  . KNEE ARTHROSCOPY Right     . UPPER GI ENDOSCOPY     Family History  Problem Relation Age of Onset  . Lung cancer Mother   . Lung cancer Father   . Bone cancer Brother        bone  . Heart disease Maternal Uncle   . Colon cancer Neg Hx    Social History   Social History  . Marital status: Married    Spouse name: N/A  . Number of children: N/A  . Years of education: N/A   Occupational History  . Not on file.   Social History Main Topics  . Smoking status: Former Smoker    Packs/day: 3.00    Years: 30.00    Quit date: 07/16/2002  . Smokeless tobacco: Never Used  . Alcohol use 10.8 oz/week    18 Cans of beer per week     Comment: socially  . Drug use: No  . Sexual activity: Yes   Other Topics Concern  . Not on file   Social History Narrative   Married, [name]. Children (2) Adult, 4 grandchildren.    9 th grade, Retired.   Wears seatbelt   Smoke detector in the home.    Firearms locked in the home.    Feels safe in his relationships.    Allergies as of 04/08/2017      Reactions   Celebrex [celecoxib]    Cymbalta [duloxetine Hcl]  Lyrica [pregabalin]    Neurontin [gabapentin]    Penicillins    REACTION: hives      Medication List       Accurate as of 04/08/17  9:16 AM. Always use your most recent med list.          acetaminophen 500 MG tablet Commonly known as:  TYLENOL Take 500 mg by mouth.   amLODipine 5 MG tablet Commonly known as:  NORVASC Take 1 tablet (5 mg total) by mouth daily.   aspirin 81 MG tablet Take 81 mg by mouth daily.   cyclobenzaprine 10 MG tablet Commonly known as:  FLEXERIL Take 1 tablet (10 mg total) by mouth 3 (three) times daily as needed for muscle spasms.   hydrochlorothiazide 50 MG tablet Commonly known as:  HYDRODIURIL Take 1 tablet (50 mg total) by mouth daily.   pantoprazole 40 MG tablet Commonly known as:  PROTONIX Take 1 tablet (40 mg total) by mouth daily.   simvastatin 10 MG tablet Commonly known as:  ZOCOR Take 1 tablet (10  mg total) by mouth every other day.   tamsulosin 0.4 MG Caps capsule Commonly known as:  FLOMAX Take 0.4 mg by mouth at bedtime.        No results found for this or any previous visit (from the past 2160 hour(s)).   ROS: 14 pt review of systems performed and negative (unless mentioned in an HPI)  Objective: BP 137/86 (BP Location: Left Arm, Patient Position: Sitting, Cuff Size: Large)   Pulse 100   Temp (!) 97.4 F (36.3 C)   Resp 20   Ht 6\' 2"  (1.88 m)   Wt 225 lb 12 oz (102.4 kg)   SpO2 99%   BMI 28.98 kg/m   Gen: Afebrile. No acute distress.  HENT: AT. [place].  MMM.  Eyes:Pupils Equal Round Reactive to light, Extraocular movements intact,  Conjunctiva without redness, discharge or icterus. Neck/lymp/endocrine: Supple, no thyromegaly CV: RRR no murmur, no edema, +2/4 P posterior tibialis pulses Chest: CTAB, no wheeze or crackles Abd: Soft. NTND. BS present.  Skin: no rashes, purpura or petechiae.  Neuro:  Normal gait. PERLA. EOMi. Alert. Oriented. Psych: Normal affect, dress and demeanor. Normal speech. Normal thought content and judgment.  Assessment/plan: Nicholas Graham is a 66 y.o. male present for CPE. Essential hypertension, benign/Hyperlipidemia - borderline.  - continue amlodipine and increase HCTZ to 50 mg QD. - continue zocor QOD - continue baby ASA - low salt diet, continue exercise > 150 m a week.  - Amlodipine refills 90 d with additional refill.  - hydrochlorothiazide (HYDRODIURIL) 50 MG tablet; Take 1 tablet (25 mg total) by mouth daily.  Dispense: 90 tablet; Refill: 1 - F/U 6 months on HTN   Return in about 4 weeks (around 05/06/2017) for CPE. 6 months between for HTN  Electronically signed by: Howard Pouch, DO [place]

## 2017-04-08 NOTE — Patient Instructions (Signed)
Increase HCTZ to 50 mg. New prescription with be for increased dose (still take just one).  You can finish your old prescription by taking 2 of the 25 mg a day (at same time).   Amlodipine dose stays the same and this was called in for you.   Prilosec also called in for you.    It was great to see you today.

## 2017-04-12 ENCOUNTER — Encounter: Payer: Self-pay | Admitting: *Deleted

## 2017-04-12 ENCOUNTER — Telehealth: Payer: Self-pay | Admitting: *Deleted

## 2017-04-12 DIAGNOSIS — I10 Essential (primary) hypertension: Secondary | ICD-10-CM

## 2017-04-12 MED ORDER — HYDROCHLOROTHIAZIDE 25 MG PO TABS: 25.0000 mg | ORAL_TABLET | Freq: Every day | ORAL | 0 refills | Status: DC

## 2017-04-12 MED ORDER — AMLODIPINE BESYLATE 5 MG PO TABS: 7.5000 mg | ORAL_TABLET | Freq: Every day | ORAL | 0 refills | Status: DC

## 2017-04-12 NOTE — Telephone Encounter (Signed)
patient called and states that since his HCTZ was increased he has had a headache everyday . He states he takes medication in the morning and aroud 1 pm everyday he gets a headache . He wants to reduce medication back down. His Bp readings were Tues-130/88 Wed 123/81 Thurs 118/76 and today 133/80. Please advise.

## 2017-04-12 NOTE — Telephone Encounter (Signed)
We can decrease the HCTZ down to 25 mg again. But I would want to increase his amlodipine to 1.5 tabs daily. Have him records blood pressure on that regimen. Goal < 135/85, ideally 120s/70s. If doe snot meet goal then please have him call back in.  - please change both on medication list.

## 2017-04-12 NOTE — Telephone Encounter (Signed)
Left detailed information and instructions on patient voice mail per DPR also sent in [facility] Chart. Medication changes noted in chart and sent to patient pharmacy.

## 2017-05-15 ENCOUNTER — Encounter: Payer: Self-pay | Admitting: Family Medicine

## 2017-05-15 ENCOUNTER — Ambulatory Visit (INDEPENDENT_AMBULATORY_CARE_PROVIDER_SITE_OTHER): Payer: BLUE CROSS/BLUE SHIELD | Admitting: Family Medicine

## 2017-05-15 VITALS — BP 121/82 | HR 89 | Temp 98.0°F | Resp 20 | Ht 74.0 in | Wt 225.0 lb

## 2017-05-15 DIAGNOSIS — E785 Hyperlipidemia, unspecified: Secondary | ICD-10-CM

## 2017-05-15 DIAGNOSIS — I10 Essential (primary) hypertension: Secondary | ICD-10-CM

## 2017-05-15 DIAGNOSIS — R911 Solitary pulmonary nodule: Secondary | ICD-10-CM

## 2017-05-15 DIAGNOSIS — K2100 Gastro-esophageal reflux disease with esophagitis, without bleeding: Secondary | ICD-10-CM

## 2017-05-15 DIAGNOSIS — R7309 Other abnormal glucose: Secondary | ICD-10-CM

## 2017-05-15 DIAGNOSIS — Z87891 Personal history of nicotine dependence: Secondary | ICD-10-CM

## 2017-05-15 DIAGNOSIS — Z0001 Encounter for general adult medical examination with abnormal findings: Secondary | ICD-10-CM

## 2017-05-15 DIAGNOSIS — Z125 Encounter for screening for malignant neoplasm of prostate: Secondary | ICD-10-CM

## 2017-05-15 DIAGNOSIS — L989 Disorder of the skin and subcutaneous tissue, unspecified: Secondary | ICD-10-CM

## 2017-05-15 DIAGNOSIS — Z86010 Personal history of colonic polyps: Secondary | ICD-10-CM

## 2017-05-15 DIAGNOSIS — J984 Other disorders of lung: Secondary | ICD-10-CM

## 2017-05-15 LAB — CBC WITH DIFFERENTIAL/PLATELET
BASOS ABS: 0 10*3/uL (ref 0.0–0.1)
BASOS PCT: 0.5 % (ref 0.0–3.0)
EOS PCT: 0.9 % (ref 0.0–5.0)
Eosinophils Absolute: 0 10*3/uL (ref 0.0–0.7)
HEMATOCRIT: 51.5 % (ref 39.0–52.0)
Hemoglobin: 17.1 g/dL — ABNORMAL HIGH (ref 13.0–17.0)
LYMPHS PCT: 31 % (ref 12.0–46.0)
Lymphs Abs: 1.8 10*3/uL (ref 0.7–4.0)
MCHC: 33.2 g/dL (ref 30.0–36.0)
MCV: 94 fl (ref 78.0–100.0)
MONOS PCT: 10.3 % (ref 3.0–12.0)
Monocytes Absolute: 0.6 10*3/uL (ref 0.1–1.0)
NEUTROS ABS: 3.3 10*3/uL (ref 1.4–7.7)
Neutrophils Relative %: 57.3 % (ref 43.0–77.0)
PLATELETS: 171 10*3/uL (ref 150.0–400.0)
RBC: 5.48 Mil/uL (ref 4.22–5.81)
RDW: 13.8 % (ref 11.5–15.5)
WBC: 5.7 10*3/uL (ref 4.0–10.5)

## 2017-05-15 LAB — PSA: PSA: 2.65 ng/mL (ref 0.10–4.00)

## 2017-05-15 LAB — COMPREHENSIVE METABOLIC PANEL
ALT: 17 U/L (ref 0–53)
AST: 18 U/L (ref 0–37)
Albumin: 4.8 g/dL (ref 3.5–5.2)
Alkaline Phosphatase: 59 U/L (ref 39–117)
BUN: 14 mg/dL (ref 6–23)
CALCIUM: 10.1 mg/dL (ref 8.4–10.5)
CO2: 29 meq/L (ref 19–32)
Chloride: 101 mEq/L (ref 96–112)
Creatinine, Ser: 0.94 mg/dL (ref 0.40–1.50)
GFR: 85.92 mL/min (ref >60.00)
Glucose, Bld: 124 mg/dL — ABNORMAL HIGH (ref 70–99)
Potassium: 3.9 mEq/L (ref 3.5–5.1)
Sodium: 139 mEq/L (ref 135–145)
Total Bilirubin: 0.9 mg/dL (ref 0.2–1.2)
Total Protein: 7.2 g/dL (ref 6.0–8.3)

## 2017-05-15 LAB — LIPID PANEL
CHOL/HDL RATIO: 4
Cholesterol: 185 mg/dL (ref 0–200)
HDL: 43.5 mg/dL (ref >39.00)
LDL Cholesterol: 110 mg/dL — ABNORMAL HIGH (ref 0–99)
NonHDL: 141.37
TRIGLYCERIDES: 157 mg/dL — AB (ref 0.0–149.0)
VLDL: 31.4 mg/dL (ref 0.0–40.0)

## 2017-05-15 LAB — HEMOGLOBIN A1C: Hgb A1c MFr Bld: 5.6 % (ref 4.6–6.5)

## 2017-05-15 MED ORDER — HYDROCHLOROTHIAZIDE 25 MG PO TABS: 25.0000 mg | ORAL_TABLET | Freq: Every day | ORAL | 1 refills | Status: DC

## 2017-05-15 MED ORDER — AMLODIPINE BESYLATE 5 MG PO TABS: 5.0000 mg | ORAL_TABLET | Freq: Every day | ORAL | 1 refills | Status: DC

## 2017-05-15 MED ORDER — AMLODIPINE BESYLATE 2.5 MG PO TABS: 2.5000 mg | ORAL_TABLET | Freq: Every day | ORAL | 1 refills | Status: DC

## 2017-05-15 MED ORDER — SIMVASTATIN 10 MG PO TABS: ORAL_TABLET | ORAL | 11 refills | Status: DC

## 2017-05-15 NOTE — Progress Notes (Signed)
Patient ID: Nicholas Graham, male  DOB: [birth year], 66 y.o.   MRN: [id number] Patient Care Team    Relationship Specialty Notifications Start End  [name], DO PCP - General Family Medicine  10/31/15   [name], MD Consulting Physician Gastroenterology  04/24/16     Subjective:  Nicholas Graham is a 66 y.o. male present for CPE with complaints.  All past medical history, surgical history, allergies, family history, immunizations, medications and social history were updated in the electronic medical record today. All recent labs, ED visits and hospitalizations within the last year were reviewed.  Skin lesions: Patient reports 3 lesions on his skin which are concerning. One is on his right neck, that he noticed within the last few weeks. It is mildly red and raised, irritated, gets caught when brushing his hair. Second lesion on left forearm, he states it is flaky and raised,, 8 continues to become irritated and he scratches excoriating the area. This has been present for a few months. The third lesion is a "bump" on his left lateral hip area. It is soft and nontender.  Pulmonary nodule: Repeat CT chest overdue. Pt is agreeable for repeat testing today. Health maintenance:  Colonoscopy: last screen 2017, recommend follow up 33yr resulted diverticulosis, x3 polyps.  Dr. AHavery Moros Immunizations: declines all immunizations.  Infectious disease screening: Declines all infectious disease screening.  PSA:  Lab Results  Component Value Date   PSA 2.03 04/23/2016   Assistive device: None Oxygen use: None Patient has a Dental home. Hospitalizations/ED visits: None     There is no immunization history on file for this patient. [facility] 05/15/2017 04/23/2016  Decreased Interest 0 0  Down, Depressed, Hopeless 0 0  PHQ - 2 Score 0 0   Current Exercise Habits: Home exercise routine, Type of exercise: walking, Time (Minutes): 30, Frequency (Times/Week): 7,  Weekly Exercise (Minutes/Week): 210, Intensity: Mild    Past Medical History:  Diagnosis Date  . Arm fracture    left. Dx'd w/RSD post fracture  . Carpal tunnel syndrome   . Chest pain 2016   [facility]. [facility] doing stress echo, but feels like musculoskeletal chest wall pain is cause of his discomfort  . Diverticulosis   . GERD (gastroesophageal reflux disease)   . Hyperlipidemia   . Hypertension   . Nephrolithiasis   . Plantar fasciitis   . Pulmonary nodule 04/2016   [place].  Pt chose to repeat CT chest w/out contrast in 3 mo---as per radiologist's recommendations.  . Right inguinal hernia    fat-containing.  Small hydrocele in left hemiscrotum.  . Tobacco dependence in remission    Allergies  Allergen Reactions  . Celebrex [Celecoxib]   . Cymbalta [Duloxetine Hcl]   . Lyrica [Pregabalin]   . Neurontin [Gabapentin]   . Penicillins     REACTION: hives   Past Surgical History:  Procedure Laterality Date  . BACK SURGERY  1993  . COLONOSCOPY    . INGUINAL HERNIA REPAIR Left 2013   repeat hernia repair. x2  . KNEE ARTHROSCOPY Right   . UPPER GI ENDOSCOPY     Family History  Problem Relation Age of Onset  . Lung cancer Mother   . Lung cancer Father   . Bone cancer Brother        bone  . Heart disease Maternal Uncle   . Colon cancer Neg Hx    Social History   Social History  .  Marital status: Married    Spouse name: N/A  . Number of children: N/A  . Years of education: N/A   Occupational History  . Not on file.   Social History Main Topics  . Smoking status: Former Smoker    Packs/day: 3.00    Years: 30.00    Quit date: 07/16/2002  . Smokeless tobacco: Never Used  . Alcohol use 10.8 oz/week    18 Cans of beer per week     Comment: socially  . Drug use: No  . Sexual activity: Yes   Other Topics Concern  . Not on file   Social History Narrative   Married, Nicholas Graham. Children (2) Adult, 4 grandchildren.    9 th grade, Retired.    Wears seatbelt   Smoke detector in the home.    Firearms locked in the home.    Feels safe in his relationships.    Allergies as of 05/15/2017      Reactions   Celebrex [celecoxib]    Cymbalta [duloxetine Hcl]    Lyrica [pregabalin]    Neurontin [gabapentin]    Penicillins    REACTION: hives      Medication List       Accurate as of 05/15/17  8:58 AM. Always use your most recent med list.          acetaminophen 500 MG tablet Commonly known as:  TYLENOL Take 500 mg by mouth.   amLODipine 5 MG tablet Commonly known as:  NORVASC Take 1 tablet (5 mg total) by mouth daily. Total of 7.5 mg with 81m and 2.5 mg tabs   amLODipine 2.5 MG tablet Commonly known as:  NORVASC Take 1 tablet (2.5 mg total) by mouth daily. Total of 7.5 mg with 554mand 2.5 mg tabs QD   aspirin 81 MG tablet Take 81 mg by mouth daily.   hydrochlorothiazide 25 MG tablet Commonly known as:  HYDRODIURIL Take 1 tablet (25 mg total) by mouth daily.   pantoprazole 40 MG tablet Commonly known as:  PROTONIX Take 1 tablet (40 mg total) by mouth daily.   simvastatin 10 MG tablet Commonly known as:  ZOCOR Take 1 tablet (10 mg total) by mouth every other day.            Discharge Care Instructions        Start     Ordered   05/15/17 0000  CBC w/Diff     05/15/17 0849   05/15/17 0000  Comp Met (CMET)     05/15/17 0849   05/15/17 0000  Lipid panel     05/15/17 0849   05/15/17 0000  PSA     05/15/17 0849   05/15/17 0000  HgB A1c     05/15/17 0849   05/15/17 0000  amLODipine (NORVASC) 5 MG tablet  Daily    Comments:  Total of 7.5 mg with 5 and 2.5 mg tabs   05/15/17 0849   05/15/17 0000  amLODipine (NORVASC) 2.5 MG tablet  Daily    Comments:  Total of 7.5 mg with 5 and 2.5 mg tabs   05/15/17 0849   05/15/17 0000  simvastatin (ZOCOR) 10 MG tablet     05/15/17 0849   05/15/17 0000  hydrochlorothiazide (HYDRODIURIL) 25 MG tablet  Daily     05/15/17 0849       No results found for this or  any previous visit (from the past 2160 hour(s)).   ROS: 14 pt review of systems performed and  negative (unless mentioned in an HPI)  Objective: BP 121/82 (BP Location: Left Arm, Patient Position: Sitting, Cuff Size: Large)   Pulse 89   Temp 98 F (36.7 C)   Resp 20   Ht _0  (1.88 m)   Wt 225 lb (102.1 kg)   SpO2 99%   BMI 28.89 kg/m  Gen: Afebrile. No acute distress. Nontoxic in appearance, well-developed, well-nourished, pleasant Caucasian male. HENT: AT. [place]. Bilateral TM visualized and normal in appearance. MMM. Bilateral nares without erythema, swelling or drainage. Throat without erythema or exudates. No cough, no hoarseness. Eyes:Pupils Equal Round Reactive to light, Extraocular movements intact,  Conjunctiva without redness, discharge or icterus. Neck/lymp/endocrine: Supple, no lymphadenopathy, no thyromegaly CV: RRR no murmur, no edema, +2/4 P posterior tibialis pulses Chest: CTAB, no wheeze or crackles Abd: Soft. Flat. NTND. BS present. No Masses palpated.  MSK: No erythema, no soft tissue swelling. Full range of motion. Norvasc intact distally. Skin: No rashes, purpura or petechiae. Right neck lesion:~4 mm raised warty appearing lesion. Left forearm: Raised, scaly, excoriated lesion. Left lateral hip:~1.5 cm soft, mobile, nontender mass consistent with lipoma. Neuro: Normal gait. PERLA. EOMi. Alert. Oriented. Cranial nerves II through XII intact. Muscle strength 5/5 u/l extremity. DTRs equal bilaterally. Psych: Normal affect, dress and demeanor. Normal speech. Normal thought content and judgment.    Assessment/plan: MAKSYM PFIFFNER is a 66 y.o. male present for CPE. Encounter for general adult medical examination with abnormal findings Dyslipidemia Prostate cancer screening History of colonic polyps Patient was encouraged to exercise greater than 150 minutes a week, eating a low saturated fat, higher fiber diet. - Lipid panel - PSA - TSH - CBC w/Diff - Comp Met  (CMET) Essential hypertension, benign - Stable today. Patient desired to have amlodipine doses split into different pills, secondary to difficulty breaking the 5 mg tablet. Ordered amlodipine 5 mg and amlodipine 2.5 mg, for a total of 7.5 mg a day. - CBC w/Diff - Comp Met (CMET) - Lipid panel - amLODipine (NORVASC) 5 MG tablet; Take 1 tablet (5 mg total) by mouth daily. Total of 7.5 mg with 30m and 2.5 mg tabs  Dispense: 90 tablet; Refill: 1 - Follow-up 6 months Dyslipidemia - Lipid panel Elevated hemoglobin A1c - HgB A1c Screening for prostate cancer - PSA History of smoking 30 or more pack years Pulmonary lesion identified 04/30/2016--> rpt recommended.  - CT chest ordered, overdue for repeat and pt is agreeable to testing today Skin Lesions: Neck lesion and forearm lesion are irritating and uncomfortable to him. Recommend removing the total out squamous cell. Patient will schedule appointment to have removed at his earliest convenience.  Return in about 1 year (around 05/15/2018) for CPE.  Electronically signed by: RHoward Pouch DO [place]

## 2017-05-15 NOTE — Patient Instructions (Signed)
Health Maintenance, Male A healthy lifestyle and preventive care is important for your health and wellness. Ask your health care provider about what schedule of regular examinations is right for you. What should I know about weight and diet? Eat a Healthy Diet  Eat plenty of vegetables, fruits, whole grains, low-fat dairy products, and lean protein.  Do not eat a lot of foods high in solid fats, added sugars, or salt.  Maintain a Healthy Weight Regular exercise can help you achieve or maintain a healthy weight. You should:  Do at least 150 minutes of exercise each week. The exercise should increase your heart rate and make you sweat (moderate-intensity exercise).  Do strength-training exercises at least twice a week.  Watch Your Levels of Cholesterol and Blood Lipids  Have your blood tested for lipids and cholesterol every 5 years starting at 64 years of age. If you are at high risk for heart disease, you should start having your blood tested when you are 64 years old. You may need to have your cholesterol levels checked more often if: ? Your lipid or cholesterol levels are high. ? You are older than 64 years of age. ? You are at high risk for heart disease.  What should I know about cancer screening? Many types of cancers can be detected early and may often be prevented. Lung Cancer  You should be screened every year for lung cancer if: ? You are a current smoker who has smoked for at least 30 years. ? You are a former smoker who has quit within the past 15 years.  Talk to your health care provider about your screening options, when you should start screening, and how often you should be screened.  Colorectal Cancer  Routine colorectal cancer screening usually begins at 64 years of age and should be repeated every 5-10 years until you are 64 years old. You may need to be screened more often if early forms of precancerous polyps or small growths are found. Your health care provider  may recommend screening at an earlier age if you have risk factors for colon cancer.  Your health care provider may recommend using home test kits to check for hidden blood in the stool.  A small camera at the end of a tube can be used to examine your colon (sigmoidoscopy or colonoscopy). This checks for the earliest forms of colorectal cancer.  Prostate and Testicular Cancer  Depending on your age and overall health, your health care provider may do certain tests to screen for prostate and testicular cancer.  Talk to your health care provider about any symptoms or concerns you have about testicular or prostate cancer.  Skin Cancer  Check your skin from head to toe regularly.  Tell your health care provider about any new moles or changes in moles, especially if: ? There is a change in a mole's size, shape, or color. ? You have a mole that is larger than a pencil eraser.  Always use sunscreen. Apply sunscreen liberally and repeat throughout the day.  Protect yourself by wearing long sleeves, pants, a wide-brimmed hat, and sunglasses when outside.  What should I know about heart disease, diabetes, and high blood pressure?  If you are 18-39 years of age, have your blood pressure checked every 3-5 years. If you are 40 years of age or older, have your blood pressure checked every year. You should have your blood pressure measured twice-once when you are at a hospital or clinic, and once when   you are not at a hospital or clinic. Record the average of the two measurements. To check your blood pressure when you are not at a hospital or clinic, you can use: ? An automated blood pressure machine at a pharmacy. ? A home blood pressure monitor.  Talk to your health care provider about your target blood pressure.  If you are between 33-55 years old, ask your health care provider if you should take aspirin to prevent heart disease.  Have regular diabetes screenings by checking your fasting blood  sugar level. ? If you are at a normal weight and have a low risk for diabetes, have this test once every three years after the age of 63. ? If you are overweight and have a high risk for diabetes, consider being tested at a younger age or more often.  A one-time screening for abdominal aortic aneurysm (AAA) by ultrasound is recommended for men aged 38-75 years who are current or former smokers. What should I know about preventing infection? Hepatitis B If you have a higher risk for hepatitis B, you should be screened for this virus. Talk with your health care provider to find out if you are at risk for hepatitis B infection. Hepatitis C Blood testing is recommended for:  Everyone born from [birth year] through 1963.  Anyone with known risk factors for hepatitis C.  Sexually Transmitted Diseases (STDs)  You should be screened each year for STDs including gonorrhea and chlamydia if: ? You are sexually active and are younger than 64 years of age. ? You are older than 64 years of age and your health care provider tells you that you are at risk for this type of infection. ? Your sexual activity has changed since you were last screened and you are at an increased risk for chlamydia or gonorrhea. Ask your health care provider if you are at risk.  Talk with your health care provider about whether you are at high risk of being infected with HIV. Your health care provider may recommend a prescription medicine to help prevent HIV infection.  What else can I do?  Schedule regular health, dental, and eye exams.  Stay current with your vaccines (immunizations).  Do not use any tobacco products, such as cigarettes, chewing tobacco, and e-cigarettes. If you need help quitting, ask your health care provider.  Limit alcohol intake to no more than 2 drinks per day. One drink equals 12 ounces of beer, 5 ounces of wine, or 1 ounces of hard liquor.  Do not use street drugs.  Do not share needles.  Ask your  health care provider for help if you need support or information about quitting drugs.  Tell your health care provider if you often feel depressed.  Tell your health care provider if you have ever been abused or do not feel safe at home. This information is not intended to replace advice given to you by your health care provider. Make sure you discuss any questions you have with your health care provider. Document Released: 03/01/2008 Document Revised: 05/02/2016 Document Reviewed: 06/07/2015 Elsevier Interactive Patient Education  [employer].   I will order the CT for your chest to schedule in November.

## 2017-05-16 ENCOUNTER — Telehealth: Payer: Self-pay | Admitting: Family Medicine

## 2017-05-16 DIAGNOSIS — J984 Other disorders of lung: Secondary | ICD-10-CM | POA: Insufficient documentation

## 2017-05-16 DIAGNOSIS — L989 Disorder of the skin and subcutaneous tissue, unspecified: Secondary | ICD-10-CM | POA: Insufficient documentation

## 2017-05-16 NOTE — Telephone Encounter (Signed)
Left detailed message with results and instructions on patient voice mail per DPR 

## 2017-05-16 NOTE — Telephone Encounter (Signed)
Please call pt: His labs are all stable. If he can tolerate it, I would recommend he take a fish oil supplement (1000mg ) a day to help keep his triglycerides down, they are just barely above goal.

## 2017-05-28 ENCOUNTER — Encounter: Payer: Self-pay | Admitting: Family Medicine

## 2017-05-28 ENCOUNTER — Ambulatory Visit (INDEPENDENT_AMBULATORY_CARE_PROVIDER_SITE_OTHER): Payer: BLUE CROSS/BLUE SHIELD | Admitting: Family Medicine

## 2017-05-28 VITALS — BP 134/78 | HR 90 | Temp 97.8°F | Resp 20 | Wt 227.5 lb

## 2017-05-28 DIAGNOSIS — L989 Disorder of the skin and subcutaneous tissue, unspecified: Secondary | ICD-10-CM | POA: Diagnosis not present

## 2017-05-28 DIAGNOSIS — X32XXXA Exposure to sunlight, initial encounter: Secondary | ICD-10-CM

## 2017-05-28 NOTE — Patient Instructions (Signed)
Keep area clean and dry for next week.  Keep covered with band aid and ointment, as we discussed.  No standing water until healed (bath water, lakes, pools, rivers etc). You may shower as normal and use regular soap and water for cleansing.   Follow up needed only if areas become red, swollen, bleed or drain/become infected. We will call you the pathology results once available.

## 2017-05-28 NOTE — Progress Notes (Signed)
Nicholas Graham , December 15, 1950, 66 y.o., male MRN: [id number] Patient Care Team    Relationship Specialty Notifications Start End  [name], DO PCP - General Family Medicine  10/31/15   [name], MD Consulting Physician Gastroenterology  04/24/16     Chief Complaint  Patient presents with  . Procedure    lesion removal     Subjective: Pt presents for an OV for removal of skin lesions on his neck and forearm that are concerning, changing and irritating to him. Neck lesion gets caught on clothing and bleeds, he feels it is getting larger. Forearm lesion is commonly bumped and excoriated, it is also getting larger, more raised and nonhealing . He is concerned they could be cancerous and would like them removed. He has had excessive sun exposure throughout his life.   Depression screen [facility] 2/9 05/15/2017 04/23/2016  Decreased Interest 0 0  Down, Depressed, Hopeless 0 0  PHQ - 2 Score 0 0    Allergies  Allergen Reactions  . Celebrex [Celecoxib]   . Cymbalta [Duloxetine Hcl]   . Lyrica [Pregabalin]   . Neurontin [Gabapentin]   . Penicillins     REACTION: hives   Social History  Substance Use Topics  . Smoking status: Former Smoker    Packs/day: 3.00    Years: 30.00    Quit date: 07/16/2002  . Smokeless tobacco: Never Used  . Alcohol use 10.8 oz/week    18 Cans of beer per week     Comment: socially   Past Medical History:  Diagnosis Date  . Arm fracture    left. Dx'd w/RSD post fracture  . Carpal tunnel syndrome   . Chest pain 2016   [facility] (Dr. Matthew Saras) doing stress echo, but feels like musculoskeletal chest wall pain is cause of his discomfort  . Diverticulosis   . GERD (gastroesophageal reflux disease)   . Hyperlipidemia   . Hypertension   . Nephrolithiasis   . Plantar fasciitis   . Pulmonary nodule 04/2016   [place].  Pt chose to repeat CT chest w/out contrast in 3 mo---as per radiologist's recommendations.  . Right inguinal hernia    fat-containing.  Small hydrocele in left hemiscrotum.  . Tobacco dependence in remission    Past Surgical History:  Procedure Laterality Date  . BACK SURGERY  1993  . COLONOSCOPY    . INGUINAL HERNIA REPAIR Left 2013   repeat hernia repair. x2  . KNEE ARTHROSCOPY Right   . UPPER GI ENDOSCOPY     Family History  Problem Relation Age of Onset  . Lung cancer Mother   . Lung cancer Father   . Bone cancer Brother        bone  . Heart disease Maternal Uncle   . Colon cancer Neg Hx    Allergies as of 05/28/2017      Reactions   Celebrex [celecoxib]    Cymbalta [duloxetine Hcl]    Lyrica [pregabalin]    Neurontin [gabapentin]    Penicillins    REACTION: hives      Medication List       Accurate as of 05/28/17  9:20 AM. Always use your most recent med list.          acetaminophen 500 MG tablet Commonly known as:  TYLENOL Take 500 mg by mouth.   amLODipine 5 MG tablet Commonly known as:  NORVASC Take 1 tablet (5 mg total) by mouth daily. Total of 7.5 mg with  5mg  and 2.5 mg tabs   amLODipine 2.5 MG tablet Commonly known as:  NORVASC Take 1 tablet (2.5 mg total) by mouth daily. Total of 7.5 mg with 5mg  and 2.5 mg tabs QD   aspirin 81 MG tablet Take 81 mg by mouth daily.   hydrochlorothiazide 25 MG tablet Commonly known as:  HYDRODIURIL Take 1 tablet (25 mg total) by mouth daily.   pantoprazole 40 MG tablet Commonly known as:  PROTONIX Take 1 tablet (40 mg total) by mouth daily.   simvastatin 10 MG tablet Commonly known as:  ZOCOR Take 1 tablet (10 mg total) by mouth every other day.       All past medical history, surgical history, allergies, family history, immunizations andmedications were updated in the EMR today and reviewed under the history and medication portions of their EMR.     ROS: Negative, with the exception of above mentioned in HPI   Objective:  BP 134/78 (BP Location: Right Arm, Patient Position: Sitting, Cuff Size: Large)   Pulse 90    Temp 97.8 F (36.6 C)   Resp 20   Wt 227 lb 8 oz (103.2 kg)   SpO2 97%   BMI 29.21 kg/m  Body mass index is 29.21 kg/m. Gen: Afebrile. No acute distress. Nontoxic in appearance, well developed, well nourished.  Skin: left forearm raised flesh toned, excoriated, flaky, nonhealing lesion ~ 4 mm. Right neck flesh toned, raised warty appearing neck lesion  ~ 5 mm.    No exam data present No results found. No results found for this or any previous visit (from the past 24 hour(s)).  Assessment/Plan: Nicholas Graham is a 66 y.o. male present for OV for  - Consent formed signed for removal of two skin lesions, forearm x1 and neck x1.  - Procedure Note:  PROCEDURE NOTE: skin lesion removal, shave biopsy x2 Performed by: Dr. Raoul Pitch  Indication: irritation, pain, bleeding, changing skin lesions, excess sun exposure r/o cancerous lesions Anesthesia was obtained with 1 ml of 1% lidocaine with epi. The area was prepped in the usual sterile fashion. A shave biopsy was completed on both sites and specimen sent to derm path. A dressing was placed over the site. The patient tolerated the procedure well. Wound care instructions were given.   Post-Procedure Diagnosis: irritated raised new skin lesions x2  Complications: None Estimated Blood Loss:  N/A   Reviewed expectations re: course of current medical issues.  Discussed self-management of symptoms.  Outlined signs and symptoms indicating need for more acute intervention.  Patient verbalized understanding and all questions were answered.  Patient received an After-Visit Summary.    No orders of the defined types were placed in this encounter.    Note is dictated utilizing voice recognition software. Although note has been proof read prior to signing, occasional typographical errors still can be missed. If any questions arise, please do not hesitate to call for verification.   electronically signed by:  Howard Pouch, DO  [place]

## 2017-07-25 ENCOUNTER — Telehealth: Payer: Self-pay | Admitting: Family Medicine

## 2017-07-25 NOTE — Telephone Encounter (Signed)
Patient is requesting a copy of his skin pathology reports. Mailed today.

## 2017-07-30 ENCOUNTER — Telehealth: Payer: Self-pay | Admitting: Family Medicine

## 2017-07-30 DIAGNOSIS — Z122 Encounter for screening for malignant neoplasm of respiratory organs: Secondary | ICD-10-CM

## 2017-07-30 DIAGNOSIS — Z87891 Personal history of nicotine dependence: Secondary | ICD-10-CM | POA: Insufficient documentation

## 2017-07-30 NOTE — Telephone Encounter (Signed)
Advised patient that CT-chest has been denied by [facility]. Appointment has been cancelled.

## 2017-07-30 NOTE — Telephone Encounter (Signed)
Placed order for CT chest again. Pt is at the very least entitled to CT chest screen for his > 30 pack year history of smoking. He is a former smoker that quit within the last 15 years.   He does have a lesion that overdue for follow up imaging. CT chest for that issue was denied by his insurance. Will try to get approved under CT chest screen, which he SHOULD be entitled. Please make pt aware we are trying to get re-approved. I would encourage them to also call their insurance.

## 2017-07-31 ENCOUNTER — Ambulatory Visit (HOSPITAL_BASED_OUTPATIENT_CLINIC_OR_DEPARTMENT_OTHER): Payer: BLUE CROSS/BLUE SHIELD

## 2017-08-06 NOTE — Telephone Encounter (Signed)
Patient approved for [facility] 08/15/17. Patient aware

## 2017-08-13 ENCOUNTER — Other Ambulatory Visit: Payer: Self-pay | Admitting: Family Medicine

## 2017-08-15 ENCOUNTER — Ambulatory Visit: Payer: BLUE CROSS/BLUE SHIELD

## 2017-08-15 ENCOUNTER — Telehealth: Payer: Self-pay | Admitting: *Deleted

## 2017-08-15 NOTE — Telephone Encounter (Signed)
Patient dropped off some paperwork for an Insurance claim to be completed by physician regarding recent removal several skin lesions. Called to get clarification on purpose of this form since it indicates its for critical illness and not a procedure. Spoke with patients wife she states they have an Set designer that pays them but requires physician to complete form and attach pathology report so they can get $60.00 back. She states it just has to say he had lesions removed as one time procedure and copy of pathology  report. They would like [place] to call them when form is completed and ready for pick up so the can submit .

## 2017-08-19 ENCOUNTER — Telehealth: Payer: Self-pay | Admitting: Family Medicine

## 2017-08-19 NOTE — Telephone Encounter (Signed)
Spoke with patient's wife let her know form ready for pick up.

## 2017-08-19 NOTE — Telephone Encounter (Signed)
Completed form. Path report printed, please attach to form. Make pt aware completed.

## 2017-09-12 ENCOUNTER — Encounter: Payer: Self-pay | Admitting: Family Medicine

## 2017-09-12 ENCOUNTER — Ambulatory Visit: Payer: BLUE CROSS/BLUE SHIELD | Admitting: Family Medicine

## 2017-09-12 VITALS — BP 126/77 | HR 106 | Temp 97.7°F | Wt 230.8 lb

## 2017-09-12 DIAGNOSIS — R000 Tachycardia, unspecified: Secondary | ICD-10-CM | POA: Insufficient documentation

## 2017-09-12 DIAGNOSIS — F419 Anxiety disorder, unspecified: Secondary | ICD-10-CM | POA: Diagnosis not present

## 2017-09-12 MED ORDER — ESCITALOPRAM OXALATE 10 MG PO TABS: 10.0000 mg | ORAL_TABLET | Freq: Every day | ORAL | 0 refills | Status: DC

## 2017-09-12 NOTE — Progress Notes (Signed)
Nicholas Graham , 06/07/1951, 67 y.o., male MRN: [id number] Patient Care Team    Relationship Specialty Notifications Start End  [name], DO PCP - General Family Medicine  10/31/15   [name], MD (Inactive) Consulting Physician Gastroenterology  04/24/16     Chief Complaint  Patient presents with  . Anxiety    pts wanted to talk about chill pills     Subjective: Pt presents for an OV with complaints of anxiety of 1 month duration.  Patient states he feels "wide open all the time. ". He feels like he is constantly has to be doing something. He is having difficulty sleeping. He retired approximately 2 years ago. He states he's kept until busy during this time completing tasks around the home that had been overdue. However, he has run out of things to do and he thinks that is causing him to have some increased anxiety. He has decided to start back work a couple days a week. Just enough to occupy his mind and give him something to do, but still be able to be retired. He denies nausea, vomit, palpitations or dizziness. He does endorse having difficulty sleeping.  Negative mood disorder screening today.  Depression screen [facility] 2/9 09/12/2017 05/15/2017 04/23/2016  Decreased Interest 0 0 0  Down, Depressed, Hopeless 0 0 0  PHQ - 2 Score 0 0 0  Altered sleeping 3 - -  Tired, decreased energy 0 - -  Change in appetite 0 - -  Feeling bad or failure about yourself  0 - -  Trouble concentrating 0 - -  Moving slowly or fidgety/restless 0 - -  Suicidal thoughts 0 - -  PHQ-9 Score 3 - -   GAD 7 : Generalized Anxiety Score 09/12/2017  Nervous, Anxious, on Edge 0  Control/stop worrying 0  Worry too much - different things 0  Trouble relaxing 1  Restless 0  Easily annoyed or irritable 0  Afraid - awful might happen 0  Total GAD 7 Score 1      Allergies  Allergen Reactions  . Celebrex [Celecoxib]   . Cymbalta [Duloxetine Hcl]   . Lyrica [Pregabalin]   . Neurontin  [Gabapentin]   . Penicillins     REACTION: hives   Social History   Tobacco Use  . Smoking status: Former Smoker    Packs/day: 3.00    Years: 30.00    Pack years: 90.00    Last attempt to quit: 07/16/2002    Years since quitting: 15.1  . Smokeless tobacco: Never Used  Substance Use Topics  . Alcohol use: Yes    Alcohol/week: 10.8 oz    Types: 18 Cans of beer per week    Comment: socially   Past Medical History:  Diagnosis Date  . Arm fracture    left. Dx'd w/RSD post fracture  . Carpal tunnel syndrome   . Chest pain 2016   [facility] (Dr. Matthew Saras) doing stress echo, but feels like musculoskeletal chest wall pain is cause of his discomfort  . Diverticulosis   . GERD (gastroesophageal reflux disease)   . Hyperlipidemia   . Hypertension   . Nephrolithiasis   . Plantar fasciitis   . Pulmonary nodule 04/2016   [place].  Pt chose to repeat CT chest w/out contrast in 3 mo---as per radiologist's recommendations.  . Right inguinal hernia    fat-containing.  Small hydrocele in left hemiscrotum.  . Tobacco dependence in remission    Past Surgical History:  Procedure Laterality Date  . BACK SURGERY  1993  . COLONOSCOPY    . INGUINAL HERNIA REPAIR Left 2013   repeat hernia repair. x2  . KNEE ARTHROSCOPY Right   . UPPER GI ENDOSCOPY     Family History  Problem Relation Age of Onset  . Lung cancer Mother   . Lung cancer Father   . Bone cancer Brother        bone  . Heart disease Maternal Uncle   . Colon cancer Neg Hx    Allergies as of 09/12/2017      Reactions   Celebrex [celecoxib]    Cymbalta [duloxetine Hcl]    Lyrica [pregabalin]    Neurontin [gabapentin]    Penicillins    REACTION: hives      Medication List        Accurate as of 09/12/17 11:46 AM. Always use your most recent med list.          acetaminophen 500 MG tablet Commonly known as:  TYLENOL Take 500 mg by mouth.   amLODipine 5 MG tablet Commonly known as:  NORVASC Take 1 tablet  (5 mg total) by mouth daily. Total of 7.5 mg with 5mg  and 2.5 mg tabs   amLODipine 2.5 MG tablet Commonly known as:  NORVASC TAKE ONE tablet (2.5 MG total) by MOUTH daily. Total OF 7.5 MG WITH 5mg  AND 2.5 MG TABLETS EVERY DAY]   aspirin 81 MG tablet Take 81 mg by mouth daily.   escitalopram 10 MG tablet Commonly known as:  LEXAPRO Take 1 tablet (10 mg total) by mouth daily.   hydrochlorothiazide 25 MG tablet Commonly known as:  HYDRODIURIL Take 1 tablet (25 mg total) by mouth daily.   pantoprazole 40 MG tablet Commonly known as:  PROTONIX Take 1 tablet (40 mg total) by mouth daily.   simvastatin 10 MG tablet Commonly known as:  ZOCOR Take 1 tablet (10 mg total) by mouth every other day.       All past medical history, surgical history, allergies, family history, immunizations andmedications were updated in the EMR today and reviewed under the history and medication portions of their EMR.     ROS: Negative, with the exception of above mentioned in HPI   Objective:  BP 126/77 (BP Location: Left Arm, Patient Position: Sitting, Cuff Size: Large)   Pulse (!) 106   Temp 97.7 F (36.5 C) (Oral)   Wt 230 lb 12.8 oz (104.7 kg)   SpO2 95%   BMI 29.63 kg/m  Body mass index is 29.63 kg/m. Gen: Afebrile. No acute distress. Nontoxic in appearance, well developed, well nourished.  HENT: AT. [place].  MMM. No thyromegaly Eyes:Pupils Equal Round Reactive to light, Extraocular movements intact,  Conjunctiva without redness, discharge or icterus. CV: Tachycardic. No murmur. Psych: Mildly anxious, otherwise Normal affect, dress and demeanor. Normal speech. Normal thought content and judgment.  No exam data present No results found. No results found for this or any previous visit (from the past 24 hour(s)).  Assessment/Plan: Nicholas Graham is a 66 y.o. male present for OV for  Anxiety Tachycardia - Both anxiety and mild tachycardia are new. Could be secondary to increased anxiety.  Repeat heart rate was 101. Rhythm is normal. Collect TSH to be thorough. - Discussed different treatment options for him today. Agreed going back to work a couple hours a week will probably be helpful for him. Finding a new hobby would also be helpful. - He is agreeable to start  low-dose medication, start Lexapro 10 mg daily. - TSH - Follow-up 4 weeks. If TSH is normal, and heart rate still above 100, consider starting low-dose beta blocker   Reviewed expectations re: course of current medical issues.  Discussed self-management of symptoms.  Outlined signs and symptoms indicating need for more acute intervention.  Patient verbalized understanding and all questions were answered.  Patient received an After-Visit Summary.    Orders Placed This Encounter  Procedures  . TSH     Note is dictated utilizing voice recognition software. Although note has been proof read prior to signing, occasional typographical errors still can be missed. If any questions arise, please do not hesitate to call for verification.   electronically signed by:  Howard Pouch, DO  [place]

## 2017-09-12 NOTE — Patient Instructions (Signed)
Start Lexapro daily. Follow up in 4 weeks before med needs refilled and we will adjust dose if needed at that time.     I hope you have a Happy New Years!!!

## 2017-09-13 LAB — TSH: TSH: 2.31 u[IU]/mL (ref 0.35–4.50)

## 2017-10-03 ENCOUNTER — Ambulatory Visit: Payer: BLUE CROSS/BLUE SHIELD | Admitting: Family Medicine

## 2017-10-04 ENCOUNTER — Ambulatory Visit: Payer: BLUE CROSS/BLUE SHIELD | Admitting: Family Medicine

## 2017-10-04 ENCOUNTER — Encounter: Payer: Self-pay | Admitting: Family Medicine

## 2017-10-04 VITALS — BP 126/73 | HR 87 | Temp 97.4°F | Ht 74.0 in | Wt 229.0 lb

## 2017-10-04 DIAGNOSIS — F419 Anxiety disorder, unspecified: Secondary | ICD-10-CM | POA: Diagnosis not present

## 2017-10-04 DIAGNOSIS — R000 Tachycardia, unspecified: Secondary | ICD-10-CM | POA: Diagnosis not present

## 2017-10-04 MED ORDER — ESCITALOPRAM OXALATE 20 MG PO TABS: 20.0000 mg | ORAL_TABLET | Freq: Every day | ORAL | 1 refills | Status: DC

## 2017-10-04 NOTE — Progress Notes (Signed)
Nicholas Graham , 01-20-1951, 67 y.o., male MRN: [id number] Patient Care Team    Relationship Specialty Notifications Start End  [name], DO PCP - General Family Medicine  10/31/15   [name], MD (Inactive) Consulting Physician Gastroenterology  04/24/16     Chief Complaint  Patient presents with  . Anxiety    pts is here for 4 week follow up     Subjective:  Patient reports he is doing well on the Lexapro 10 mg daily. He does not notice any side effects to the medication. He states it is improving his anxiety and irritability, although he does feel he could be increased on the dose. He has returned to work a few days a week and is also helping. Prior note 12/28-03/2017: Pt presents for an OV with complaints of anxiety of 1 month duration.  Patient states he feels "wide open all the time. ". He feels like he is constantly has to be doing something. He is having difficulty sleeping. He retired approximately 2 years ago. He states he's kept until busy during this time completing tasks around the home that had been overdue. However, he has run out of things to do and he thinks that is causing him to have some increased anxiety. He has decided to start back work a couple days a week. Just enough to occupy his mind and give him something to do, but still be able to be retired. He denies nausea, vomit, palpitations or dizziness. He does endorse having difficulty sleeping.  Negative mood disorder screening today.  Depression screen [facility] 2/9 09/12/2017 05/15/2017 04/23/2016  Decreased Interest 0 0 0  Down, Depressed, Hopeless 0 0 0  PHQ - 2 Score 0 0 0  Altered sleeping 3 - -  Tired, decreased energy 0 - -  Change in appetite 0 - -  Feeling bad or failure about yourself  0 - -  Trouble concentrating 0 - -  Moving slowly or fidgety/restless 0 - -  Suicidal thoughts 0 - -  PHQ-9 Score 3 - -   GAD 7 : Generalized Anxiety Score 10/04/2017 09/12/2017  Nervous, Anxious, on Edge 0 0    Control/stop worrying 0 0  Worry too much - different things 0 0  Trouble relaxing 1 1  Restless 1 0  Easily annoyed or irritable 0 0  Afraid - awful might happen 0 0  Total GAD 7 Score 2 1  Anxiety Difficulty Not difficult at all -      Allergies  Allergen Reactions  . Celebrex [Celecoxib]   . Cymbalta [Duloxetine Hcl]   . Lyrica [Pregabalin]   . Neurontin [Gabapentin]   . Penicillins     REACTION: hives   Social History   Tobacco Use  . Smoking status: Former Smoker    Packs/day: 3.00    Years: 30.00    Pack years: 90.00    Last attempt to quit: 07/16/2002    Years since quitting: 15.2  . Smokeless tobacco: Never Used  Substance Use Topics  . Alcohol use: Yes    Alcohol/week: 10.8 oz    Types: 18 Cans of beer per week    Comment: socially   Past Medical History:  Diagnosis Date  . Arm fracture    left. Dx'd w/RSD post fracture  . Carpal tunnel syndrome   . Chest pain 2016   [facility] (Dr. Matthew Saras) doing stress echo, but feels like musculoskeletal chest wall pain is cause of his  discomfort  . Diverticulosis   . GERD (gastroesophageal reflux disease)   . Hyperlipidemia   . Hypertension   . Nephrolithiasis   . Plantar fasciitis   . Pulmonary nodule 04/2016   [place].  Pt chose to repeat CT chest w/out contrast in 3 mo---as per radiologist's recommendations.  . Right inguinal hernia    fat-containing.  Small hydrocele in left hemiscrotum.  . Tobacco dependence in remission    Past Surgical History:  Procedure Laterality Date  . BACK SURGERY  1993  . COLONOSCOPY    . INGUINAL HERNIA REPAIR Left 2013   repeat hernia repair. x2  . KNEE ARTHROSCOPY Right   . UPPER GI ENDOSCOPY     Family History  Problem Relation Age of Onset  . Lung cancer Mother   . Lung cancer Father   . Bone cancer Brother        bone  . Heart disease Maternal Uncle   . Colon cancer Neg Hx    Allergies as of 10/04/2017      Reactions   Celebrex [celecoxib]     Cymbalta [duloxetine Hcl]    Lyrica [pregabalin]    Neurontin [gabapentin]    Penicillins    REACTION: hives      Medication List        Accurate as of 10/04/17  9:44 AM. Always use your most recent med list.          acetaminophen 500 MG tablet Commonly known as:  TYLENOL Take 500 mg by mouth.   amLODipine 5 MG tablet Commonly known as:  NORVASC Take 1 tablet (5 mg total) by mouth daily. Total of 7.5 mg with 5mg  and 2.5 mg tabs   amLODipine 2.5 MG tablet Commonly known as:  NORVASC TAKE ONE tablet (2.5 MG total) by MOUTH daily. Total OF 7.5 MG WITH 5mg  AND 2.5 MG TABLETS EVERY DAY]   aspirin 81 MG tablet Take 81 mg by mouth daily.   escitalopram 10 MG tablet Commonly known as:  LEXAPRO Take 1 tablet (10 mg total) by mouth daily.   hydrochlorothiazide 25 MG tablet Commonly known as:  HYDRODIURIL Take 1 tablet (25 mg total) by mouth daily.   pantoprazole 40 MG tablet Commonly known as:  PROTONIX Take 1 tablet (40 mg total) by mouth daily.   simvastatin 10 MG tablet Commonly known as:  ZOCOR Take 1 tablet (10 mg total) by mouth every other day.       All past medical history, surgical history, allergies, family history, immunizations andmedications were updated in the EMR today and reviewed under the history and medication portions of their EMR.     ROS: Negative, with the exception of above mentioned in HPI   Objective:  BP 126/73 (BP Location: Right Arm, Patient Position: Sitting, Cuff Size: Normal)   Pulse 87   Temp (!) 97.4 F (36.3 C) (Oral)   Ht 6\' 2"  (1.88 m)   Wt 229 lb (103.9 kg)   SpO2 96%   BMI 29.40 kg/m  Body mass index is 29.4 kg/m. Gen: Afebrile. No acute distress.  Psych: Normal affect, dress and demeanor. Normal speech. Normal thought content and judgment.    No exam data present No results found. No results found for this or any previous visit (from the past 24 hour(s)).  Assessment/Plan: ELISANDRO JARRETT is a 67 y.o. male  present for OV for  Tachycardia--> resolved with anti-anxiety medication Anxiety - increased to lexapro 20 mg. Doing great, could use  a little more coverage.  - lexapro 20 mg prescribed. 90d, 1 refill - F/u every 6 mos with other [facility]   Reviewed expectations re: course of current medical issues.  Discussed self-management of symptoms.  Outlined signs and symptoms indicating need for more acute intervention.  Patient verbalized understanding and all questions were answered.  Patient received an After-Visit Summary.    No orders of the defined types were placed in this encounter.    Note is dictated utilizing voice recognition software. Although note has been proof read prior to signing, occasional typographical errors still can be missed. If any questions arise, please do not hesitate to call for verification.   electronically signed by:  Howard Pouch, DO  [place]

## 2017-10-04 NOTE — Patient Instructions (Signed)
I am glad you ar edoing well.  Called in the increase dose of lexapro  Follow up with other conditions.    Please help [place] help you:  We are honored you have chosen [facility] for your Primary Care home. Below you will find basic instructions that you may need to access in the future. Please help [place] help you by reading the instructions, which cover many of the frequent questions we experience.   Prescription refills and request:  -In order to allow more efficient response time, please call your pharmacy for all refills. They will forward the request electronically to [place]. This allows for the quickest possible response. Request left on a nurse line can take longer to refill, since these are checked as time allows between office patients and other phone calls.  - refill request can take up to 3-5 working days to complete.  - If request is sent electronically and request is appropiate, it is usually completed in 1-2 business days.  - all patients will need to be seen routinely for all chronic medical conditions requiring prescription medications (see follow-up below). If you are overdue for follow up on your condition, you will be asked to make an appointment and we will call in enough medication to cover you until your appointment (up to 30 days).  - all controlled substances will require a face to face visit to request/refill.  - if you desire your prescriptions to go through a new pharmacy, and have an active script at original pharmacy, you will need to call your pharmacy and have scripts transferred to new pharmacy. This is completed between the pharmacy locations and not by your provider.    Results: If any images or labs were ordered, it can take up to 1 week to get results depending on the test ordered and the lab/facility running and resulting the test. - Normal or stable results, which do not need further discussion, may be released to your mychart immediately with attached note to you.  A call may not be generated for normal results. Please make certain to sign up for mychart. If you have questions on how to activate your mychart you can call the front office.  - If your results need further discussion, our office will attempt to contact you via phone, and if unable to reach you after 2 attempts, we will release your abnormal result to your mychart with instructions.  - All results will be automatically released in mychart after 1 week.  - Your provider will provide you with explanation and instruction on all relevant material in your results. Please keep in mind, results and labs may appear confusing or abnormal to the untrained eye, but it does not mean they are actually abnormal for you personally. If you have any questions about your results that are not covered, or you desire more detailed explanation than what was provided, you should make an appointment with your provider to do so.   Our office handles many outgoing and incoming calls daily. If we have not contacted you within 1 week about your results, please check your mychart to see if there is a message first and if not, then contact our office.  In helping with this matter, you help decrease call volume, and therefore allow [place] to be able to respond to patients needs more efficiently.   Acute office visits (sick visit):  An acute visit is intended for a new problem and are scheduled in shorter time slots to  allow schedule openings for patients with new problems. This is the appropriate visit to discuss a new problem. In order to provide you with excellent quality medical care with proper time for you to explain your problem, have an exam and receive treatment with instructions, these appointments should be limited to one new problem per visit. If you experience a new problem, in which you desire to be addressed, please make an acute office visit, we save openings on the schedule to accommodate you. Please do not save your new  problem for any other type of visit, let us take care of it properly and quickly for you.   Follow up visits:  Depending on your condition(s) your provider will need to see you routinely in order to provide you with quality care and prescribe medication(s). Most chronic conditions (Example: hypertension, Diabetes, depression/anxiety... etc), require visits a couple times a year. Your provider will instruct you on proper follow up for your personal medical conditions and history. Please make certain to make follow up appointments for your condition as instructed. Failing to do so could result in lapse in your medication treatment/refills. If you request a refill, and are overdue to be seen on a condition, we will always provide you with a 30 day script (once) to allow you time to schedule.    Medicare wellness (well visit): - we have a wonderful Nurse Maudie Mercury), that will meet with you and provide you will yearly medicare wellness visits. These visits should occur yearly (can not be scheduled less than 1 calendar year apart) and cover preventive health, immunizations, advance directives and screenings you are entitled to yearly through your medicare benefits. Do not miss out on your entitled benefits, this is when medicare will pay for these benefits to be ordered for you.  These are strongly encouraged by your provider and is the appropriate type of visit to make certain you are up to date with all preventive health benefits. If you have not had your medicare wellness exam in the last 12 months, please make certain to schedule one by calling the office and schedule your medicare wellness with Maudie Mercury as soon as possible.   Yearly physical (well visit):  - Adults are recommended to be seen yearly for physicals. Check with your insurance and date of your last physical, most insurances require one calendar year between physicals. Physicals include all preventive health topics, screenings, medical exam and labs that  are appropriate for gender/age and history. You may have fasting labs needed at this visit. This is a well visit (not a sick visit), new problems should not be covered during this visit (see acute visit).  - Pediatric patients are seen more frequently when they are younger. Your provider will advise you on well child visit timing that is appropriate for your their age. - This is not a medicare wellness visit. Medicare wellness exams do not have an exam portion to the visit. Some medicare companies allow for a physical, some do not allow a yearly physical. If your medicare allows a yearly physical you can schedule the medicare wellness with our nurse Maudie Mercury and have your physical with your provider after, on the same day. Please check with insurance for your full benefits.   Late Policy/No Shows:  - all new patients should arrive 15-30 minutes earlier than appointment to allow [place] time  to  obtain all personal demographics,  insurance information and for you to complete office paperwork. - All established patients should arrive 10-15 minutes  earlier than appointment time to update all information and be checked in .  - In our best efforts to run on time, if you are late for your appointment you will be asked to either reschedule or if able, we will work you back into the schedule. There will be a wait time to work you back in the schedule,  depending on availability.  - If you are unable to make it to your appointment as scheduled, please call 24 hours ahead of time to allow [place] to fill the time slot with someone else who needs to be seen. If you do not cancel your appointment ahead of time, you may be charged a no show fee.

## 2017-10-07 ENCOUNTER — Ambulatory Visit: Payer: BLUE CROSS/BLUE SHIELD | Admitting: Family Medicine

## 2017-11-15 ENCOUNTER — Encounter: Payer: Self-pay | Admitting: Family Medicine

## 2017-11-15 ENCOUNTER — Ambulatory Visit: Payer: BLUE CROSS/BLUE SHIELD | Admitting: Family Medicine

## 2017-11-15 VITALS — BP 134/72 | HR 82 | Temp 97.4°F | Ht 74.0 in | Wt 231.1 lb

## 2017-11-15 DIAGNOSIS — I10 Essential (primary) hypertension: Secondary | ICD-10-CM

## 2017-11-15 DIAGNOSIS — E785 Hyperlipidemia, unspecified: Secondary | ICD-10-CM | POA: Diagnosis not present

## 2017-11-15 DIAGNOSIS — N401 Enlarged prostate with lower urinary tract symptoms: Secondary | ICD-10-CM | POA: Diagnosis not present

## 2017-11-15 DIAGNOSIS — F419 Anxiety disorder, unspecified: Secondary | ICD-10-CM

## 2017-11-15 MED ORDER — TAMSULOSIN HCL 0.4 MG PO CAPS: 0.4000 mg | ORAL_CAPSULE | Freq: Every day | ORAL | 3 refills | Status: DC

## 2017-11-15 MED ORDER — PAROXETINE HCL 20 MG PO TABS: 20.0000 mg | ORAL_TABLET | Freq: Every day | ORAL | 11 refills | Status: DC

## 2017-11-15 NOTE — Progress Notes (Signed)
Nicholas Graham , 1950/10/17, 67 y.o., male MRN: [id number] Patient Care Team    Relationship Specialty Notifications Start End  [name], DO PCP - General Family Medicine  10/31/15   [name], MD (Inactive) Consulting Physician Gastroenterology  04/24/16     Chief Complaint  Patient presents with  . Follow-up    HTN/HLD     Subjective:  Hypertension/HLD: Pt reports compliance with amlodipine 7.5 mg daily and HCTZ 25 mg daily. Blood pressures ranges at home not routinely checked. Patient denies chest pain, shortness of breath or lower extremity edema. Pt takes a daily baby ASA. Pt is  prescribed statin. BMP: 05/15/2017 within normal limits CBC: 05/15/2017 with mildly increased hemoglobin 17.1 Lipid: 05/15/2017 total cholesterol 185, HDL 43, LDL 110, triglycerides 157 TSH: 09/12/2017 2.31 Diet: Low-sodium Exercise: Not exercising as routinely. RF: Hypertension, hyperlipidemia, family history of heart disease. Overweight.   Anxiety: Patient reported new onset anxiety after retirement. He states he is "wide open all the time ". He was started on Lexapro and initially was feeling better. He now reports he is feeling increase in his anxiety again. He is not exercising as routinely secondary to the weather. He has returned to work part-time.  BPH: Patient reports increased in urinary frequency, nocturia 3, incomplete bladder emptying, and trouble at the end of stream. This has been worsening over the last 6 months. She reports she was started on Flomax by urology in the past. He took Flomax for 1-2 weeks and did not feel was helpful, therefore stop taking the medication.  Depression screen [facility] 2/9 11/15/2017 09/12/2017 05/15/2017 04/23/2016  Decreased Interest 0 0 0 0  Down, Depressed, Hopeless 0 0 0 0  PHQ - 2 Score 0 0 0 0  Altered sleeping 3 3 - -  Tired, decreased energy 0 0 - -  Change in appetite 0 0 - -  Feeling bad or failure about yourself  0 0 - -  Trouble  concentrating 0 0 - -  Moving slowly or fidgety/restless 0 0 - -  Suicidal thoughts 0 0 - -  PHQ-9 Score 3 3 - -  Difficult doing work/chores Not difficult at all - - -    Allergies  Allergen Reactions  . Celebrex [Celecoxib]   . Cymbalta [Duloxetine Hcl]   . Lyrica [Pregabalin]   . Neurontin [Gabapentin]   . Penicillins     REACTION: hives   Social History   Tobacco Use  . Smoking status: Former Smoker    Packs/day: 3.00    Years: 30.00    Pack years: 90.00    Last attempt to quit: 07/16/2002    Years since quitting: 15.3  . Smokeless tobacco: Never Used  Substance Use Topics  . Alcohol use: Yes    Alcohol/week: 10.8 oz    Types: 18 Cans of beer per week    Comment: socially   Past Medical History:  Diagnosis Date  . Arm fracture    left. Dx'd w/RSD post fracture  . Carpal tunnel syndrome   . Chest pain 2016   [facility] (Dr. Matthew Saras) doing stress echo, but feels like musculoskeletal chest wall pain is cause of his discomfort  . Diverticulosis   . GERD (gastroesophageal reflux disease)   . Hyperlipidemia   . Hypertension   . Nephrolithiasis   . Plantar fasciitis   . Pulmonary nodule 04/2016   [place].  Pt chose to repeat CT chest w/out contrast in 3 mo---as per  radiologist's recommendations.  . Right inguinal hernia    fat-containing.  Small hydrocele in left hemiscrotum.  . Tobacco dependence in remission    Past Surgical History:  Procedure Laterality Date  . BACK SURGERY  1993  . COLONOSCOPY    . INGUINAL HERNIA REPAIR Left 2013   repeat hernia repair. x2  . KNEE ARTHROSCOPY Right   . UPPER GI ENDOSCOPY     Family History  Problem Relation Age of Onset  . Lung cancer Mother   . Lung cancer Father   . Bone cancer Brother        bone  . Heart disease Maternal Uncle   . Colon cancer Neg Hx    Allergies as of 11/15/2017      Reactions   Celebrex [celecoxib]    Cymbalta [duloxetine Hcl]    Lyrica [pregabalin]    Neurontin [gabapentin]      Penicillins    REACTION: hives      Medication List        Accurate as of 11/15/17  8:07 AM. Always use your most recent med list.          acetaminophen 500 MG tablet Commonly known as:  TYLENOL Take 500 mg by mouth.   amLODipine 5 MG tablet Commonly known as:  NORVASC Take 1 tablet (5 mg total) by mouth daily. Total of 7.5 mg with 5mg  and 2.5 mg tabs   amLODipine 2.5 MG tablet Commonly known as:  NORVASC TAKE ONE tablet (2.5 MG total) by MOUTH daily. Total OF 7.5 MG WITH 5mg  AND 2.5 MG TABLETS EVERY DAY]   aspirin 81 MG tablet Take 81 mg by mouth daily.   escitalopram 20 MG tablet Commonly known as:  LEXAPRO Take 1 tablet (20 mg total) by mouth daily.   hydrochlorothiazide 25 MG tablet Commonly known as:  HYDRODIURIL Take 1 tablet (25 mg total) by mouth daily.   pantoprazole 40 MG tablet Commonly known as:  PROTONIX Take 1 tablet (40 mg total) by mouth daily.   simvastatin 10 MG tablet Commonly known as:  ZOCOR Take 1 tablet (10 mg total) by mouth every other day.       All past medical history, surgical history, allergies, family history, immunizations andmedications were updated in the EMR today and reviewed under the history and medication portions of their EMR.     ROS: Negative, with the exception of above mentioned in HPI   Objective:  BP (!) 142/81 (BP Location: Left Arm, Patient Position: Sitting, Cuff Size: Normal)   Pulse 82   Temp (!) 97.4 F (36.3 C) (Oral)   Ht 6\' 2"  (1.88 m)   Wt 231 lb 1.9 oz (104.8 kg)   SpO2 98%   BMI 29.67 kg/m  Body mass index is 29.67 kg/m. Gen: Afebrile. No acute distress. Nontoxic in appearance, well developed, well nourished.  HENT: AT. [place].  MMM, no oral lesions.  Eyes:Pupils Equal Round Reactive to light, Extraocular movements intact,  Conjunctiva without redness, discharge or icterus. CV: RRR no murmur, no edema Chest: CTAB, no wheeze or crackles. Good air movement, normal resp effort.  Abd: Soft. NTND.  BS present.  Neuro:  Normal gait. PERLA. EOMi. Alert. Oriented x3  Psych: Normal affect, dress and demeanor. Normal speech. Normal thought content and judgment.  No exam data present No results found. No results found for this or any previous visit (from the past 24 hour(s)).  Assessment/Plan: Nicholas Graham is a 67 y.o. male present for  OV for  Essential hypertension, benign/dyslipidemia - Borderline today. However with patient having increased in BPH like symptoms, will discontinue HCTZ. - Increase amlodipine to a total of 10 mg a day. Patient has amlodipine 5 mg and 2.5 mg at home, he will combine these to make 10 mg a day until he runs out of his current prescription. He will then call in and we will provide a new prescription for amlodipine 10 mg daily. Continue Zocor 10 mg daily. He reports he is also taking fish oil. -Follow-up 4 weeks  Anxiety - Patient reports his anxiety is still higher than he would like. Discussed multiple different options with him today. And he would like to switch therapy to Paxil. - Discontinued Lexapro 20 mg daily and start Paxil 20 mg daily - Follow-up 4 weeks  Benign prostatic hyperplasia with lower urinary tract symptoms, symptom details unspecified Discontinue HCTZ Start Flomax 0.4 mg daily Follow-up in 4 weeks and if needed can increase Flomax dose at that time.   Reviewed expectations re: course of current medical issues.  Discussed self-management of symptoms.  Outlined signs and symptoms indicating need for more acute intervention.  Patient verbalized understanding and all questions were answered.  Patient received an After-Visit Summary.    No orders of the defined types were placed in this encounter.    Note is dictated utilizing voice recognition software. Although note has been proof read prior to signing, occasional typographical errors still can be missed. If any questions arise, please do not hesitate to call for  verification.   electronically signed by:  Howard Pouch, DO  [place]

## 2017-11-15 NOTE — Patient Instructions (Addendum)
Stop lexapro, start paxil daily.   Stop HCTZ. Start a total of 10 mg of amlodipine total.   Flomax restart att 0.4 mg a day for 3 weeks, if not improved we will increase the dose at that time to 0.8 mg.    Follow 1 month to check on urine and anxiety  Please help [place] help you:  We are honored you have chosen [facility] for your Primary Care home. Below you will find basic instructions that you may need to access in the future. Please help [place] help you by reading the instructions, which cover many of the frequent questions we experience.   Prescription refills and request:  -In order to allow more efficient response time, please call your pharmacy for all refills. They will forward the request electronically to [place]. This allows for the quickest possible response. Request left on a nurse line can take longer to refill, since these are checked as time allows between office patients and other phone calls.  - refill request can take up to 3-5 working days to complete.  - If request is sent electronically and request is appropiate, it is usually completed in 1-2 business days.  - all patients will need to be seen routinely for all chronic medical conditions requiring prescription medications (see follow-up below). If you are overdue for follow up on your condition, you will be asked to make an appointment and we will call in enough medication to cover you until your appointment (up to 30 days).  - all controlled substances will require a face to face visit to request/refill.  - if you desire your prescriptions to go through a new pharmacy, and have an active script at original pharmacy, you will need to call your pharmacy and have scripts transferred to new pharmacy. This is completed between the pharmacy locations and not by your provider.    Results: If any images or labs were ordered, it can take up to 1 week to get results depending on the test ordered and the lab/facility running and  resulting the test. - Normal or stable results, which do not need further discussion, may be released to your mychart immediately with attached note to you. A call may not be generated for normal results. Please make certain to sign up for mychart. If you have questions on how to activate your mychart you can call the front office.  - If your results need further discussion, our office will attempt to contact you via phone, and if unable to reach you after 2 attempts, we will release your abnormal result to your mychart with instructions.  - All results will be automatically released in mychart after 1 week.  - Your provider will provide you with explanation and instruction on all relevant material in your results. Please keep in mind, results and labs may appear confusing or abnormal to the untrained eye, but it does not mean they are actually abnormal for you personally. If you have any questions about your results that are not covered, or you desire more detailed explanation than what was provided, you should make an appointment with your provider to do so.   Our office handles many outgoing and incoming calls daily. If we have not contacted you within 1 week about your results, please check your mychart to see if there is a message first and if not, then contact our office.  In helping with this matter, you help decrease call volume, and therefore allow [place] to be  able to respond to patients needs more efficiently.   Acute office visits (sick visit):  An acute visit is intended for a new problem and are scheduled in shorter time slots to allow schedule openings for patients with new problems. This is the appropriate visit to discuss a new problem. In order to provide you with excellent quality medical care with proper time for you to explain your problem, have an exam and receive treatment with instructions, these appointments should be limited to one new problem per visit. If you experience a new  problem, in which you desire to be addressed, please make an acute office visit, we save openings on the schedule to accommodate you. Please do not save your new problem for any other type of visit, let us take care of it properly and quickly for you.   Follow up visits:  Depending on your condition(s) your provider will need to see you routinely in order to provide you with quality care and prescribe medication(s). Most chronic conditions (Example: hypertension, Diabetes, depression/anxiety... etc), require visits a couple times a year. Your provider will instruct you on proper follow up for your personal medical conditions and history. Please make certain to make follow up appointments for your condition as instructed. Failing to do so could result in lapse in your medication treatment/refills. If you request a refill, and are overdue to be seen on a condition, we will always provide you with a 30 day script (once) to allow you time to schedule.    Medicare wellness (well visit): - we have a wonderful Nurse Maudie Mercury), that will meet with you and provide you will yearly medicare wellness visits. These visits should occur yearly (can not be scheduled less than 1 calendar year apart) and cover preventive health, immunizations, advance directives and screenings you are entitled to yearly through your medicare benefits. Do not miss out on your entitled benefits, this is when medicare will pay for these benefits to be ordered for you.  These are strongly encouraged by your provider and is the appropriate type of visit to make certain you are up to date with all preventive health benefits. If you have not had your medicare wellness exam in the last 12 months, please make certain to schedule one by calling the office and schedule your medicare wellness with Maudie Mercury as soon as possible.   Yearly physical (well visit):  - Adults are recommended to be seen yearly for physicals. Check with your insurance and date of your  last physical, most insurances require one calendar year between physicals. Physicals include all preventive health topics, screenings, medical exam and labs that are appropriate for gender/age and history. You may have fasting labs needed at this visit. This is a well visit (not a sick visit), new problems should not be covered during this visit (see acute visit).  - Pediatric patients are seen more frequently when they are younger. Your provider will advise you on well child visit timing that is appropriate for your their age. - This is not a medicare wellness visit. Medicare wellness exams do not have an exam portion to the visit. Some medicare companies allow for a physical, some do not allow a yearly physical. If your medicare allows a yearly physical you can schedule the medicare wellness with our nurse Maudie Mercury and have your physical with your provider after, on the same day. Please check with insurance for your full benefits.   Late Policy/No Shows:  - all new patients should arrive 15-30  minutes earlier than appointment to allow [place] time  to  obtain all personal demographics,  insurance information and for you to complete office paperwork. - All established patients should arrive 10-15 minutes earlier than appointment time to update all information and be checked in .  - In our best efforts to run on time, if you are late for your appointment you will be asked to either reschedule or if able, we will work you back into the schedule. There will be a wait time to work you back in the schedule,  depending on availability.  - If you are unable to make it to your appointment as scheduled, please call 24 hours ahead of time to allow [place] to fill the time slot with someone else who needs to be seen. If you do not cancel your appointment ahead of time, you may be charged a no show fee.

## 2017-12-13 ENCOUNTER — Telehealth: Payer: Self-pay | Admitting: Family Medicine

## 2017-12-13 ENCOUNTER — Encounter: Payer: Self-pay | Admitting: Family Medicine

## 2017-12-13 ENCOUNTER — Ambulatory Visit: Payer: BLUE CROSS/BLUE SHIELD | Admitting: Family Medicine

## 2017-12-13 VITALS — BP 122/77 | HR 86 | Temp 97.8°F | Ht 74.0 in | Wt 224.0 lb

## 2017-12-13 DIAGNOSIS — I10 Essential (primary) hypertension: Secondary | ICD-10-CM | POA: Diagnosis not present

## 2017-12-13 DIAGNOSIS — F419 Anxiety disorder, unspecified: Secondary | ICD-10-CM | POA: Diagnosis not present

## 2017-12-13 DIAGNOSIS — E785 Hyperlipidemia, unspecified: Secondary | ICD-10-CM

## 2017-12-13 DIAGNOSIS — N401 Enlarged prostate with lower urinary tract symptoms: Secondary | ICD-10-CM | POA: Diagnosis not present

## 2017-12-13 MED ORDER — AMLODIPINE BESYLATE 10 MG PO TABS: 10.0000 mg | ORAL_TABLET | Freq: Every day | ORAL | 1 refills | Status: DC

## 2017-12-13 MED ORDER — ESCITALOPRAM OXALATE 20 MG PO TABS: 20.0000 mg | ORAL_TABLET | Freq: Every day | ORAL | 1 refills | Status: DC

## 2017-12-13 MED ORDER — CYCLOBENZAPRINE HCL 10 MG PO TABS: 5.0000 mg | ORAL_TABLET | Freq: Two times a day (BID) | ORAL | 2 refills | Status: DC | PRN

## 2017-12-13 MED ORDER — PAROXETINE HCL 20 MG PO TABS: 20.0000 mg | ORAL_TABLET | Freq: Every day | ORAL | 1 refills | Status: DC

## 2017-12-13 MED ORDER — TAMSULOSIN HCL 0.4 MG PO CAPS: 0.8000 mg | ORAL_CAPSULE | Freq: Every day | ORAL | 3 refills | Status: DC

## 2017-12-13 MED ORDER — CYCLOBENZAPRINE HCL 5 MG PO TABS: 5.0000 mg | ORAL_TABLET | Freq: Two times a day (BID) | ORAL | 1 refills | Status: DC | PRN

## 2017-12-13 MED ORDER — PAROXETINE HCL 40 MG PO TABS: 40.0000 mg | ORAL_TABLET | Freq: Every day | ORAL | 1 refills | Status: DC

## 2017-12-13 NOTE — Telephone Encounter (Signed)
Thank you.  Please call him back with following instructions: - My recommendations are to increase the paxil dose to 40 mg a day. I have called in the higher dose of paxil.  - STOP lexapro all together. I have DCd this script at his pharmacy ([facility]-please call pharmacy and make certain lexapro dc'd and they are aware paxil is 40 mg qd)  If higher dose of paxil is working follow up in 6 mos at [facility], if after 4 weeks paxil 40 mg a day is not enough, then we will need to see him.    Again, Sorry for miscommunication. I misunderstood what he was saying today surrounding those medications.

## 2017-12-13 NOTE — Telephone Encounter (Signed)
Patient just taking Paxil and then switched to [facility].

## 2017-12-13 NOTE — Patient Instructions (Addendum)
flomax you have you will take 2 for a total dose of 0.8 mg a day. The NEW bottle is 0.8 mg dose in the pill, so then only take one.   Blood pressure looks amazing. Continue the amlodipine at 10 mg a day.   Flexeril prescribed.    Lexapro and paxil, together is ok. Take one each daily.   Follow up on 6 months

## 2017-12-13 NOTE — Telephone Encounter (Signed)
Patient notified and verbalized understanding. Pharmacy called and instructed on D/C Lexapro and new Rx for Paxil.

## 2017-12-13 NOTE — Telephone Encounter (Signed)
Please call Nicholas Graham to verify that he was taking the lexapro and paxil together ALREADY?  He had been placed on both at different times over the last few months by [place]... Normally would not recommend together, but his visit today He said "he went back on the lexapro". I assumed he meant he was taking both together ALREADY.  --> If he has not taken both already then I would suggest not to, because a side effect can occur, but if he has already been taking them together as I thought he said, then he can.   We will guide him on medication after I receive the above information.

## 2017-12-13 NOTE — Progress Notes (Signed)
Nicholas Graham , 1951/08/17, 67 y.o., male MRN: [id number] Patient Care Team    Relationship Specialty Notifications Start End  [name], DO PCP - General Family Medicine  10/31/15   [name], MD (Inactive) Consulting Physician Gastroenterology  04/24/16     Chief Complaint  Patient presents with  . Follow-up    HTN/HLD     Subjective:  Hypertension/HLD: Pt reports compliance with amlodipine 10 mg daily. Blood pressures ranges at home not routinely checked. Patient denies chest pain, shortness of breath, dizziness or lower extremity edema. Pt takes a daily baby ASA. Pt is  prescribed statin. HCTZ recently removed from regimen 2/2 BPH sx.  BMP: 05/15/2017 within normal limits CBC: 05/15/2017 with mildly increased hemoglobin 17.1 Lipid: 05/15/2017 total cholesterol 185, HDL 43, LDL 110, triglycerides 157 TSH: 09/12/2017 2.31 Diet: Low-sodium Exercise: Not exercising as routinely. RF: Hypertension, hyperlipidemia, family history of heart disease. Overweight.   Anxiety: Pt has been tried on lexapro 20 mg QD that was not effective. Switched to paxil 20 mg. He reports today he went back on the lexapro. He did not feel like he had enough coverage. He denies side effects to medications. He denies diarrhea, flushing, elevated temp etc.  Patient reported new onset anxiety after retirement. He states he is "wide open all the time ". He was started on Lexapro and initially was feeling better. He now reports he is feeling increase in his anxiety again. He is not exercising as routinely secondary to the weather. He has returned to work part-time.  BPH: He has started the flomax 0.4 mg a day again. He does not feel it has improved his symptoms.He is tolerating the medication without SE.  Prior note:  Patient reports increased in urinary frequency, nocturia 3, incomplete bladder emptying, and trouble at the end of stream. This has been worsening over the last 6 months. She reports  she was started on Flomax by urology in the past. He took Flomax for 1-2 weeks and did not feel was helpful, therefore stop taking the medication.  Low back pain:  He reports new lower back discomfort left lumbar area. He has returned to work and feels the increase in activity he stained his back. He has been taking tylenol. Aleve upsets his stomach.    Depression screen [facility] 2/9 12/13/2017 11/15/2017 09/12/2017 05/15/2017 04/23/2016  Decreased Interest 0 0 0 0 0  Down, Depressed, Hopeless 0 0 0 0 0  PHQ - 2 Score 0 0 0 0 0  Altered sleeping - 3 3 - -  Tired, decreased energy - 0 0 - -  Change in appetite - 0 0 - -  Feeling bad or failure about yourself  - 0 0 - -  Trouble concentrating - 0 0 - -  Moving slowly or fidgety/restless - 0 0 - -  Suicidal thoughts - 0 0 - -  PHQ-9 Score - 3 3 - -  Difficult doing work/chores - Not difficult at all - - -    Allergies  Allergen Reactions  . Celebrex [Celecoxib]   . Cymbalta [Duloxetine Hcl]   . Lyrica [Pregabalin]   . Neurontin [Gabapentin]   . Penicillins     REACTION: hives   Social History   Tobacco Use  . Smoking status: Former Smoker    Packs/day: 3.00    Years: 30.00    Pack years: 90.00    Last attempt to quit: 07/16/2002    Years since quitting: 15.4  .  Smokeless tobacco: Never Used  Substance Use Topics  . Alcohol use: Yes    Alcohol/week: 10.8 oz    Types: 18 Cans of beer per week    Comment: socially   Past Medical History:  Diagnosis Date  . Arm fracture    left. Dx'd w/RSD post fracture  . Carpal tunnel syndrome   . Chest pain 2016   [facility] (Dr. Matthew Saras) doing stress echo, but feels like musculoskeletal chest wall pain is cause of his discomfort  . Diverticulosis   . GERD (gastroesophageal reflux disease)   . Hyperlipidemia   . Hypertension   . Nephrolithiasis   . Plantar fasciitis   . Pulmonary nodule 04/2016   [place].  Pt chose to repeat CT chest w/out contrast in 3 mo---as per radiologist's  recommendations.  . Right inguinal hernia    fat-containing.  Small hydrocele in left hemiscrotum.  . Tobacco dependence in remission    Past Surgical History:  Procedure Laterality Date  . BACK SURGERY  1993  . COLONOSCOPY    . INGUINAL HERNIA REPAIR Left 2013   repeat hernia repair. x2  . KNEE ARTHROSCOPY Right   . UPPER GI ENDOSCOPY     Family History  Problem Relation Age of Onset  . Lung cancer Mother   . Lung cancer Father   . Bone cancer Brother        bone  . Heart disease Maternal Uncle   . Colon cancer Neg Hx    Allergies as of 12/13/2017      Reactions   Celebrex [celecoxib]    Cymbalta [duloxetine Hcl]    Lyrica [pregabalin]    Neurontin [gabapentin]    Penicillins    REACTION: hives      Medication List        Accurate as of 12/13/17 10:16 AM. Always use your most recent med list.          acetaminophen 500 MG tablet Commonly known as:  TYLENOL Take 500 mg by mouth.   amLODipine 10 MG tablet Commonly known as:  NORVASC Take 1 tablet (10 mg total) by mouth daily.   aspirin 81 MG tablet Take 81 mg by mouth daily.   cyclobenzaprine 5 MG tablet Commonly known as:  FLEXERIL Take 1 tablet (5 mg total) by mouth 2 (two) times daily as needed for muscle spasms.   pantoprazole 40 MG tablet Commonly known as:  PROTONIX Take 1 tablet (40 mg total) by mouth daily.   PARoxetine 40 MG tablet Commonly known as:  PAXIL Take 1 tablet (40 mg total) by mouth daily.   simvastatin 10 MG tablet Commonly known as:  ZOCOR Take 1 tablet (10 mg total) by mouth every other day.   tamsulosin 0.4 MG Caps capsule Commonly known as:  FLOMAX Take 2 capsules (0.8 mg total) by mouth at bedtime.       All past medical history, surgical history, allergies, family history, immunizations andmedications were updated in the EMR today and reviewed under the history and medication portions of their EMR.     ROS: Negative, with the exception of above mentioned in  HPI   Objective:  BP 122/77 (BP Location: Left Arm, Patient Position: Sitting, Cuff Size: Normal)   Pulse 86   Temp 97.8 F (36.6 C) (Oral)   Ht 6\' 2"  (1.88 m)   Wt 224 lb (101.6 kg)   SpO2 99%   BMI 28.76 kg/m  Body mass index is 28.76 kg/m. Gen: Afebrile. No  acute distress.  HENT: AT. . Bilateral TM visualized and normal in appearance. MMM.  Eyes:Pupils Equal Round Reactive to light, Extraocular movements intact,  Conjunctiva without redness, discharge or icterus. Neck/lymp/endocrine: Supple,no lymphadenopathy, no thyromegaly CV: RRR no murmur, no edema, +2/4 P posterior tibialis pulses Chest: CTAB, no wheeze or crackles Abd: Soft. NTND. BS present.  MSK: left lower lumbar back TTP with muscle spasm noted. No TTP lumbar spine. FROM. NV intact Skin: No rashes, purpura or petechiae.  Neuro: Normal gait. PERLA. EOMi. Alert. Oriented x3.  Psych: mildly anxious. Normal affect, dress and demeanor. Normal speech. Normal thought content and judgment.   No exam data present No results found. No results found for this or any previous visit (from the past 24 hour(s)).  Assessment/Plan: BREXTON SOFIA is a 67 y.o. male present for OV for  Essential hypertension, benign/dyslipidemia - well controlled on Amlodipine 10 mg QD. Refills provided today. -Continue Zocor 10 mg daily. He reports he is also taking fish oil. - continue ASA 81 mg - F/U 6 months.   Anxiety - Confusion surrounding pts medication. Ideally paxil and lexapro would not be combined 2/2 possibility of serotonin syndrome. Called pt to verify how he was taking his medications and he did not mean he was taking both, he stopped paxil himself and restarted lexapro. He does not have great control over his anxiety on either, at those doses. Therefore, recommendations are for him to increase the paxil to 40 mg a day and DISCONTINUE the lexapro. (AVS w/ not reflect these recs- printed prior to clarification) - could consider  cymbalta instead in the future, however would want to maximize the potential of paxil prior to switching again.  - pt and pharmacy made aware of changes after clarification with both.  - f/u 6 months, unless not controlled then see back after 4 weeks.   Benign prostatic hyperplasia with lower urinary tract symptoms, symptom details unspecified Discontinued HCTZ Increase to Flomax 0.8 mg daily, today. If not improved would need to return to [place].   Muscle spasm: - flexeril 5 mg BID PRN. recommend back support brace during work. Tylenol for pain. Could try dicolfenac in the future if reoccurs or not improved with flexeril.    Reviewed expectations re: course of current medical issues.  Discussed self-management of symptoms.  Outlined signs and symptoms indicating need for more acute intervention.  Patient verbalized understanding and all questions were answered.  Patient received an After-Visit Summary.    No orders of the defined types were placed in this encounter.    Note is dictated utilizing voice recognition software. Although note has been proof read prior to signing, occasional typographical errors still can be missed. If any questions arise, please do not hesitate to call for verification.   electronically signed by:  Howard Pouch, DO  [place]

## 2018-02-28 ENCOUNTER — Other Ambulatory Visit: Payer: Self-pay | Admitting: Family Medicine

## 2018-05-01 ENCOUNTER — Ambulatory Visit: Payer: BLUE CROSS/BLUE SHIELD | Admitting: Family Medicine

## 2018-05-01 ENCOUNTER — Encounter: Payer: Self-pay | Admitting: Family Medicine

## 2018-05-01 VITALS — BP 131/81 | HR 82 | Temp 97.8°F | Resp 20 | Ht 74.0 in | Wt 231.5 lb

## 2018-05-01 DIAGNOSIS — R0683 Snoring: Secondary | ICD-10-CM | POA: Insufficient documentation

## 2018-05-01 DIAGNOSIS — J342 Deviated nasal septum: Secondary | ICD-10-CM | POA: Diagnosis not present

## 2018-05-01 DIAGNOSIS — E663 Overweight: Secondary | ICD-10-CM | POA: Diagnosis not present

## 2018-05-01 NOTE — Patient Instructions (Signed)
I have referred you to ENT to see if they feel the deviated septum is main cause.  I have also referred you to pulmonology and they will see if you need a sleep study.     Sleep Apnea Sleep apnea is a condition that affects breathing. People with sleep apnea have moments during sleep when their breathing pauses briefly or gets shallow. Sleep apnea can cause these symptoms:  Trouble staying asleep.  Sleepiness or tiredness during the day.  Irritability.  Loud snoring.  Morning headaches.  Trouble concentrating.  Forgetting things.  Less interest in sex.  Being sleepy for no reason.  Mood swings.  Personality changes.  Depression.  Waking up a lot during the night to pee (urinate).  Dry mouth.  Sore throat.  Follow these instructions at home:  Make any changes in your routine that your doctor recommends.  Eat a healthy, well-balanced diet.  Take over-the-counter and prescription medicines only as told by your doctor.  Avoid using alcohol, calming medicines (sedatives), and narcotic medicines.  Take steps to lose weight if you are overweight.  If you were given a machine (device) to use while you sleep, use it only as told by your doctor.  Do not use any tobacco products, such as cigarettes, chewing tobacco, and e-cigarettes. If you need help quitting, ask your doctor.  Keep all follow-up visits as told by your doctor. This is important. Contact a doctor if:  The machine that you were given to use during sleep is uncomfortable or does not seem to be working.  Your symptoms do not get better.  Your symptoms get worse. Get help right away if:  Your chest hurts.  You have trouble breathing in enough air (shortness of breath).  You have an uncomfortable feeling in your back, arms, or stomach.  You have trouble talking.  One side of your body feels weak.  A part of your face is hanging down (drooping). These symptoms may be an emergency. Do not wait  to see if the symptoms will go away. Get medical help right away. Call your local emergency services (911 in the [place] Do not drive yourself to the hospital. This information is not intended to replace advice given to you by your health care provider. Make sure you discuss any questions you have with your health care provider. Document Released: 06/12/2008 Document Revised: 04/29/2016 Document Reviewed: 06/13/2015 Elsevier Interactive Patient Education  [employer].

## 2018-05-01 NOTE — Progress Notes (Signed)
Nicholas Graham , 06-15-1951, 67 y.o., male MRN: [id number] Patient Care Team    Relationship Specialty Notifications Start End  [name], DO PCP - General Family Medicine  10/31/15   [name], MD (Inactive) Consulting Physician Gastroenterology  04/24/16     Chief Complaint  Patient presents with  . Snoring     Subjective: Pt presents for an OV with complaints of snoring. Associated symptoms include BMI 30, male, snoring, wife feels he may be gasping for breath in the middle the night, treated for high blood pressure, greater than 78 years of age and male.  He also has a deviated nasal septum.  He is concerned he has sleep apnea and is in need of a CPAP machine.  Is also wondering if he had his nose fixed if he would not need a CPAP machine.  Depression screen [facility] 2/9 12/13/2017 11/15/2017 09/12/2017 05/15/2017 04/23/2016  Decreased Interest 0 0 0 0 0  Down, Depressed, Hopeless 0 0 0 0 0  PHQ - 2 Score 0 0 0 0 0  Altered sleeping - 3 3 - -  Tired, decreased energy - 0 0 - -  Change in appetite - 0 0 - -  Feeling bad or failure about yourself  - 0 0 - -  Trouble concentrating - 0 0 - -  Moving slowly or fidgety/restless - 0 0 - -  Suicidal thoughts - 0 0 - -  PHQ-9 Score - 3 3 - -  Difficult doing work/chores - Not difficult at all - - -    Allergies  Allergen Reactions  . Celebrex [Celecoxib]   . Cymbalta [Duloxetine Hcl]   . Lyrica [Pregabalin]   . Neurontin [Gabapentin]   . Penicillins     REACTION: hives   Social History   Tobacco Use  . Smoking status: Former Smoker    Packs/day: 3.00    Years: 30.00    Pack years: 90.00    Last attempt to quit: 07/16/2002    Years since quitting: 15.8  . Smokeless tobacco: Never Used  Substance Use Topics  . Alcohol use: Yes    Alcohol/week: 18.0 standard drinks    Types: 18 Cans of beer per week    Comment: socially   Past Medical History:  Diagnosis Date  . Arm fracture    left. Dx'd w/RSD post fracture  .  Carpal tunnel syndrome   . Chest pain 2016   [facility] (Dr. Matthew Saras) doing stress echo, but feels like musculoskeletal chest wall pain is cause of his discomfort  . Diverticulosis   . GERD (gastroesophageal reflux disease)   . Hyperlipidemia   . Hypertension   . Nephrolithiasis   . Plantar fasciitis   . Pulmonary nodule 04/2016   [place].  Pt chose to repeat CT chest w/out contrast in 3 mo---as per radiologist's recommendations.  . Right inguinal hernia    fat-containing.  Small hydrocele in left hemiscrotum.  . Tobacco dependence in remission    Past Surgical History:  Procedure Laterality Date  . BACK SURGERY  1993  . COLONOSCOPY    . INGUINAL HERNIA REPAIR Left 2013   repeat hernia repair. x2  . KNEE ARTHROSCOPY Right   . UPPER GI ENDOSCOPY     Family History  Problem Relation Age of Onset  . Lung cancer Mother   . Lung cancer Father   . Bone cancer Brother        bone  . Heart  disease Maternal Uncle   . Colon cancer Neg Hx    Allergies as of 05/01/2018      Reactions   Celebrex [celecoxib]    Cymbalta [duloxetine Hcl]    Lyrica [pregabalin]    Neurontin [gabapentin]    Penicillins    REACTION: hives      Medication List        Accurate as of 05/01/18  8:41 AM. Always use your most recent med list.          acetaminophen 500 MG tablet Commonly known as:  TYLENOL Take 500 mg by mouth.   amLODipine 10 MG tablet Commonly known as:  NORVASC Take 1 tablet (10 mg total) by mouth daily.   aspirin 81 MG tablet Take 81 mg by mouth daily.   pantoprazole 40 MG tablet Commonly known as:  PROTONIX Take 1 tablet (40 mg total) by mouth daily.   simvastatin 10 MG tablet Commonly known as:  ZOCOR Take 1 tablet (10 mg total) by mouth every other day.   tamsulosin 0.4 MG Caps capsule Commonly known as:  FLOMAX Take 2 capsules (0.8 mg total) by mouth at bedtime.       All past medical history, surgical history, allergies, family history,  immunizations andmedications were updated in the EMR today and reviewed under the history and medication portions of their EMR.     ROS: Negative, with the exception of above mentioned in HPI   Objective:  BP 131/81 (BP Location: Right Arm, Patient Position: Sitting, Cuff Size: Large)   Pulse 82   Temp 97.8 F (36.6 C)   Resp 20   Ht 6\' 2"  (1.88 m)   Wt 231 lb 8 oz (105 kg)   SpO2 96%   BMI 29.72 kg/m  Body mass index is 29.72 kg/m. Gen: Afebrile. No acute distress. Nontoxic in appearance, well developed, well nourished.  HENT: AT. [place]. Bilateral TM visualized without erythema or bulging. MMM, no oral lesions. Bilateral nares mild deviated septum, large nasal turbinates. Throat without erythema or exudates.  Small posterior pharynx. Eyes:Pupils Equal Round Reactive to light, Extraocular movements intact,  Conjunctiva without redness, discharge or icterus. Neck/lymp/endocrine: Supple, no lymphadenopathy, no thyromegaly CV: RRR, no edema Chest: CTAB, no wheeze or crackles. Good air movement, normal resp effort.  Neuro:  Normal gait. PERLA. EOMi. Alert. Oriented x3  Psych: Normal affect, dress and demeanor. Normal speech. Normal thought content and judgment.  No exam data present No results found. No results found for this or any previous visit (from the past 24 hour(s)).  Assessment/Plan: Nicholas Graham is a 67 y.o. male present for OV for  Snoring/deviated septum/overweight with BMI 29.7  -Discussed options with him today.  By Bridgette Habermann BANG/OSA assessment questionnaire he is high risk for obstructive sleep apnea.  He does have a small posterior pharynx as well.  Does have a mild deviated septum and prominent nasal turbinates as well.  Given that his wife also feels that he may be having apneic spells, advised him to have a pulmonology referral for evaluation and sleep study.  If a CPAP machine is needed they will evaluate her for need and settings, as well as ordered machine etc. -Also  suggested referral to ENT to see if there would be any benefit in septoplasty for him. - Ambulatory referral to ENT - Ambulatory referral to Pulmonology - f/u PRN   Reviewed expectations re: course of current medical issues.  Discussed self-management of symptoms.  Outlined signs and symptoms  indicating need for more acute intervention.  Patient verbalized understanding and all questions were answered.  Patient received an After-Visit Summary.    No orders of the defined types were placed in this encounter.    Note is dictated utilizing voice recognition software. Although note has been proof read prior to signing, occasional typographical errors still can be missed. If any questions arise, please do not hesitate to call for verification.   electronically signed by:  Howard Pouch, DO  [place]

## 2018-05-08 ENCOUNTER — Ambulatory Visit: Payer: BLUE CROSS/BLUE SHIELD | Admitting: Pulmonary Disease

## 2018-05-08 ENCOUNTER — Encounter: Payer: Self-pay | Admitting: Pulmonary Disease

## 2018-05-08 VITALS — BP 130/70 | HR 74 | Ht 74.0 in | Wt 238.0 lb

## 2018-05-08 DIAGNOSIS — R918 Other nonspecific abnormal finding of lung field: Secondary | ICD-10-CM | POA: Diagnosis not present

## 2018-05-08 DIAGNOSIS — G4719 Other hypersomnia: Secondary | ICD-10-CM

## 2018-05-08 DIAGNOSIS — J984 Other disorders of lung: Secondary | ICD-10-CM | POA: Diagnosis not present

## 2018-05-08 NOTE — Patient Instructions (Signed)
Schedule home sleep study. CT chest without contrast to follow-up on nodule in the lung from 2017

## 2018-05-08 NOTE — Assessment & Plan Note (Signed)
Given excessive daytime somnolence, narrow pharyngeal exam, witnessed apneas & loud snoring, obstructive sleep apnea is probable & an overnight polysomnogram will be scheduled as a home study. The pathophysiology of obstructive sleep apnea , it's cardiovascular consequences & modes of treatment including CPAP were discused with the patient in detail & they evidenced understanding.  Pretest probability is low to intermediate, we will only treat him if he has more than moderate degree of sleep disordered breathing Snoring may be related to deviated septum

## 2018-05-08 NOTE — Progress Notes (Signed)
Subjective:    Patient ID: Nicholas Graham, male    DOB: May 18, 1951, 67 y.o.   MRN: [id number]  HPI  67 year old man who presents for evaluation of sleep disordered breathing. Wife is noted loud snoring for many years.  PCP noted deviated septum.  He feels that he is not sleeping good.  Wife may have witnessed apneas.  However he denies excessive daytime somnolence. Epworth sleepiness over 7.  He is retired but denies daytime naps.  He reports sleepiness while watching TV when lying down to rest in the afternoons sometimes. Bedtime is between 830 and 10 PM, TV stays on in the bedroom, sleep latency is 30 to 45 minutes, he sleeps on his left side and occasionally turns over on his back.  Snoring is worse on his back.  Only uses one pillow.  Reports 1-2 nocturnal awakenings including nocturia and is out of bed by 6 AM feeling rested without dryness of mouth or headaches. He drinks 2 to 3 cups of coffee in the morning before he gets going.  Weight has remained stable fluctuated within 10 pounds. He leads an active lifestyle even though he is retired and walks about 2 to 3 miles daily  There is no history suggestive of cataplexy, sleep paralysis or parasomnias  He worked in the [employer] in [place].  CT chest without contrast 04/2016 showed calcified granulomas and calcified mediastinal lymphadenopathy with a 7 x 8 mm nodule in the left mid lung which had a speck of calcification eccentric.  He was told this was benign. He smoked more than 2 packs/day until he quit 15 years ago, more than 50 pack years   Past Medical History:  Diagnosis Date  . Arm fracture    left. Dx'd w/RSD post fracture  . Carpal tunnel syndrome   . Chest pain 2016   [facility] (Dr. Matthew Saras) doing stress echo, but feels like musculoskeletal chest wall pain is cause of his discomfort  . Diverticulosis   . GERD (gastroesophageal reflux disease)   . Hyperlipidemia   . Hypertension   . Nephrolithiasis    . Plantar fasciitis   . Pulmonary nodule 04/2016   [facility].  Pt chose to repeat CT chest w/out contrast in 3 mo---as per radiologist's recommendations.  . Right inguinal hernia    fat-containing.  Small hydrocele in left hemiscrotum.  . Tobacco dependence in remission      Past Surgical History:  Procedure Laterality Date  . BACK SURGERY  1993  . COLONOSCOPY    . INGUINAL HERNIA REPAIR Left 2013   repeat hernia repair. x2  . KNEE ARTHROSCOPY Right   . UPPER GI ENDOSCOPY      Allergies  Allergen Reactions  . Celebrex [Celecoxib]   . Cymbalta [Duloxetine Hcl]   . Lyrica [Pregabalin]   . Neurontin [Gabapentin]   . Penicillins     REACTION: hives    Social History   Socioeconomic History  . Marital status: Married    Spouse name: Not on file  . Number of children: Not on file  . Years of education: Not on file  . Highest education level: Not on file  Occupational History  . Not on file  Social Needs  . Financial resource strain: Not on file  . Food insecurity:    Worry: Not on file    Inability: Not on file  . Transportation needs:    Medical: Not on file    Non-medical: Not on file  Tobacco  Use  . Smoking status: Former Smoker    Packs/day: 3.00    Years: 30.00    Pack years: 90.00    Last attempt to quit: 07/16/2002    Years since quitting: 15.8  . Smokeless tobacco: Never Used  Substance and Sexual Activity  . Alcohol use: Yes    Alcohol/week: 18.0 standard drinks    Types: 18 Cans of beer per week    Comment: socially  . Drug use: No  . Sexual activity: Yes  Lifestyle  . Physical activity:    Days per week: Not on file    Minutes per session: Not on file  . Stress: Not on file  Relationships  . Social connections:    Talks on phone: Not on file    Gets together: Not on file    Attends religious service: Not on file    Active member of club or organization: Not on file    Attends meetings of clubs or organizations: Not on file    Relationship  status: Not on file  . Intimate partner violence:    Fear of current or ex partner: Not on file    Emotionally abused: Not on file    Physically abused: Not on file    Forced sexual activity: Not on file  Other Topics Concern  . Not on file  Social History Narrative   Married, [name]. Children (2) Adult, 4 grandchildren.    9 th grade, Retired.   Wears seatbelt   Smoke detector in the home.    Firearms locked in the home.    Feels safe in his relationships.       Family History  Problem Relation Age of Onset  . Lung cancer Mother   . Lung cancer Father   . Bone cancer Brother        bone  . Heart disease Maternal Uncle   . Colon cancer Neg Hx      Review of Systems Constitutional: negative for anorexia, fevers and sweats  Eyes: negative for irritation, redness and visual disturbance  Ears, nose, mouth, throat, and face: negative for earaches, epistaxis, nasal congestion and sore throat  Respiratory: negative for cough, dyspnea on exertion, sputum and wheezing  Cardiovascular: negative for chest pain, dyspnea, lower extremity edema, orthopnea, palpitations and syncope  Gastrointestinal: negative for abdominal pain, constipation, diarrhea, melena, nausea and vomiting  Genitourinary:negative for dysuria, frequency and hematuria  Hematologic/lymphatic: negative for bleeding, easy bruising and lymphadenopathy  Musculoskeletal:negative for arthralgias, muscle weakness and stiff joints  Neurological: negative for coordination problems, gait problems, headaches and weakness  Endocrine: negative for diabetic symptoms including polydipsia, polyuria and weight loss     Objective:   Physical Exam  Gen. Pleasant, well-nourished, in no distress, normal affect ENT - large beard, no post nasal drip, class 2 airway Neck: No JVD, no thyromegaly, no carotid bruits Lungs: no use of accessory muscles, no dullness to percussion, clear without rales or rhonchi  Cardiovascular: Rhythm  regular, heart sounds  normal, no murmurs or gallops, no peripheral edema Abdomen: soft and non-tender, no hepatosplenomegaly, BS normal. Musculoskeletal: No deformities, no cyanosis or clubbing Neuro:  alert, non focal       Assessment & Plan:

## 2018-05-08 NOTE — Assessment & Plan Note (Signed)
Noted in 04/2016, he has other calcified granulomas consistent with granulomatous disease and even a nodule in the mid lung has a speck of calcification but this appears to be eccentric.  Given his heavy history of smoking we will obtain a 2-year follow-up and if this is stable then would consider benign

## 2018-05-09 ENCOUNTER — Ambulatory Visit (HOSPITAL_BASED_OUTPATIENT_CLINIC_OR_DEPARTMENT_OTHER): Payer: BLUE CROSS/BLUE SHIELD

## 2018-05-09 ENCOUNTER — Encounter (HOSPITAL_BASED_OUTPATIENT_CLINIC_OR_DEPARTMENT_OTHER): Payer: Self-pay

## 2018-05-26 DIAGNOSIS — G4733 Obstructive sleep apnea (adult) (pediatric): Secondary | ICD-10-CM

## 2018-05-27 ENCOUNTER — Other Ambulatory Visit: Payer: Self-pay | Admitting: *Deleted

## 2018-05-27 DIAGNOSIS — G4733 Obstructive sleep apnea (adult) (pediatric): Secondary | ICD-10-CM

## 2018-05-27 DIAGNOSIS — G4719 Other hypersomnia: Secondary | ICD-10-CM

## 2018-05-28 ENCOUNTER — Telehealth: Payer: Self-pay | Admitting: Pulmonary Disease

## 2018-05-28 DIAGNOSIS — G4733 Obstructive sleep apnea (adult) (pediatric): Secondary | ICD-10-CM

## 2018-05-28 NOTE — Telephone Encounter (Addendum)
Dr. Ander Slade has reviewed the home sleep test this showed Severe obstructive sleep apnea.Also severe oxygen desaturation.   Recommendations  Patient will need in lab Cpap titration     Weight loss measures .   Advise against driving while sleepy & against medication with sedative side effects.   I have spoke with the patients wife she has verbalized understanding  And order has been placed nothing further needed.   Make sure of close clinical follow up.

## 2018-06-05 ENCOUNTER — Institutional Professional Consult (permissible substitution): Payer: BLUE CROSS/BLUE SHIELD | Admitting: Pulmonary Disease

## 2018-06-12 ENCOUNTER — Ambulatory Visit: Payer: BLUE CROSS/BLUE SHIELD | Admitting: Family Medicine

## 2018-06-16 ENCOUNTER — Other Ambulatory Visit: Payer: Self-pay | Admitting: Family Medicine

## 2018-06-17 ENCOUNTER — Telehealth: Payer: Self-pay

## 2018-06-17 NOTE — Telephone Encounter (Signed)
-----   Message from Rigoberto Noel, MD sent at 06/10/2018 12:48 PM EDT ----- Wallene Dales,  [place] to place order for autoCPAP 5-20 cm  RA ----- Message ----- From: Laurin Coder, MD Sent: 06/10/2018   5:23 AM EDT To: Rigoberto Noel, MD  Pt's insurance will not allow a cpap study pt will have to start auto cpap first

## 2018-06-17 NOTE — Telephone Encounter (Signed)
Spoke with patient's wife. She stated that the patient's insurance actually changed today from [place] to [facility]. She wants to know if we could still check with [facility] to see if the cpap titration would be covered.   RA, please advise if you are ok with this. Thanks!

## 2018-06-17 NOTE — Telephone Encounter (Signed)
ATC but received a busy dial tone. Will call again tomorrow to see if I can get the new insurance information for the PCCs.

## 2018-06-17 NOTE — Telephone Encounter (Signed)
Okay to check if they will allow CPAP titration study otherwise proceed with auto CPAP

## 2018-06-19 ENCOUNTER — Encounter: Payer: Self-pay | Admitting: Family Medicine

## 2018-06-19 ENCOUNTER — Ambulatory Visit (INDEPENDENT_AMBULATORY_CARE_PROVIDER_SITE_OTHER): Payer: PPO | Admitting: Family Medicine

## 2018-06-19 VITALS — BP 128/73 | HR 77 | Temp 97.7°F | Resp 20 | Ht 74.0 in | Wt 232.0 lb

## 2018-06-19 DIAGNOSIS — E785 Hyperlipidemia, unspecified: Secondary | ICD-10-CM | POA: Diagnosis not present

## 2018-06-19 DIAGNOSIS — E663 Overweight: Secondary | ICD-10-CM | POA: Diagnosis not present

## 2018-06-19 DIAGNOSIS — R7309 Other abnormal glucose: Secondary | ICD-10-CM

## 2018-06-19 DIAGNOSIS — K2100 Gastro-esophageal reflux disease with esophagitis, without bleeding: Secondary | ICD-10-CM

## 2018-06-19 DIAGNOSIS — I10 Essential (primary) hypertension: Secondary | ICD-10-CM | POA: Diagnosis not present

## 2018-06-19 LAB — LIPID PANEL
Cholesterol: 167 mg/dL (ref 0–200)
HDL: 38.3 mg/dL — ABNORMAL LOW
LDL Cholesterol: 103 mg/dL — ABNORMAL HIGH (ref 0–99)
NonHDL: 129.13
Total CHOL/HDL Ratio: 4
Triglycerides: 132 mg/dL (ref 0.0–149.0)
VLDL: 26.4 mg/dL (ref 0.0–40.0)

## 2018-06-19 LAB — CBC WITH DIFFERENTIAL/PLATELET
BASOS ABS: 0 10*3/uL (ref 0.0–0.1)
Basophils Relative: 0.7 % (ref 0.0–3.0)
EOS ABS: 0.1 10*3/uL (ref 0.0–0.7)
Eosinophils Relative: 2.3 % (ref 0.0–5.0)
HEMATOCRIT: 48.2 % (ref 39.0–52.0)
Hemoglobin: 16.1 g/dL (ref 13.0–17.0)
LYMPHS PCT: 33.6 % (ref 12.0–46.0)
Lymphs Abs: 1.5 10*3/uL (ref 0.7–4.0)
MCHC: 33.3 g/dL (ref 30.0–36.0)
MCV: 92.5 fl (ref 78.0–100.0)
MONOS PCT: 10.6 % (ref 3.0–12.0)
Monocytes Absolute: 0.5 10*3/uL (ref 0.1–1.0)
NEUTROS ABS: 2.4 10*3/uL (ref 1.4–7.7)
Neutrophils Relative %: 52.8 % (ref 43.0–77.0)
PLATELETS: 160 10*3/uL (ref 150.0–400.0)
RBC: 5.21 Mil/uL (ref 4.22–5.81)
RDW: 13.7 % (ref 11.5–15.5)
WBC: 4.5 10*3/uL (ref 4.0–10.5)

## 2018-06-19 LAB — COMPREHENSIVE METABOLIC PANEL
ALK PHOS: 65 U/L (ref 39–117)
ALT: 17 U/L (ref 0–53)
AST: 21 U/L (ref 0–37)
Albumin: 4.5 g/dL (ref 3.5–5.2)
BILIRUBIN TOTAL: 1 mg/dL (ref 0.2–1.2)
BUN: 14 mg/dL (ref 6–23)
CALCIUM: 9.5 mg/dL (ref 8.4–10.5)
CO2: 31 meq/L (ref 19–32)
CREATININE: 0.96 mg/dL (ref 0.40–1.50)
Chloride: 105 mEq/L (ref 96–112)
GFR: 83.57 mL/min (ref >60.00)
GLUCOSE: 105 mg/dL — AB (ref 70–99)
Potassium: 4.4 mEq/L (ref 3.5–5.1)
Sodium: 141 mEq/L (ref 135–145)
TOTAL PROTEIN: 6.9 g/dL (ref 6.0–8.3)

## 2018-06-19 LAB — HEMOGLOBIN A1C: Hgb A1c MFr Bld: 5.6 % (ref 4.6–6.5)

## 2018-06-19 MED ORDER — AMLODIPINE BESYLATE 10 MG PO TABS: 10.0000 mg | ORAL_TABLET | Freq: Every day | ORAL | 1 refills | Status: DC

## 2018-06-19 MED ORDER — SIMVASTATIN 10 MG PO TABS: ORAL_TABLET | ORAL | 11 refills | Status: DC

## 2018-06-19 MED ORDER — PANTOPRAZOLE SODIUM 40 MG PO TBEC: 40.0000 mg | DELAYED_RELEASE_TABLET | Freq: Every day | ORAL | 3 refills | Status: DC

## 2018-06-19 NOTE — Telephone Encounter (Signed)
Pt is calling back [phone]

## 2018-06-19 NOTE — Progress Notes (Signed)
Nicholas Graham , Dec 15, 1950, 67 y.o., male MRN: [id number] Patient Care Team    Relationship Specialty Notifications Start End  [name], DO PCP - General Family Medicine  10/31/15   [name], MD (Inactive) Consulting Physician Gastroenterology  04/24/16     Chief Complaint  Patient presents with  . Hypertension     Subjective:  Hypertension/HLD/overweight: Pt reports compliance with amlodipine 10 mg daily. Blood pressures ranges at home not routinely checked. Patient denies chest pain, shortness of breath, dizziness or lower extremity edema.  Pt takes a daily baby ASA. Pt is  prescribed statin. HCTZ recently removed from regimen 2/2 BPH sx.  BMP: 06/19/2018 within normal limits CBC: 06/19/2018 normal limits Lipid: 06/19/2018 total cholesterol 167, HDL 38, LDL 103, triglycerides 132 TSH: 09/12/2017 2.31 Diet: Low-sodium Exercise: Not exercising as routinely. RF: Hypertension, hyperlipidemia, family history of heart disease. Overweight.   Depression screen [facility] 2/9 06/19/2018 12/13/2017 11/15/2017 09/12/2017 05/15/2017  Decreased Interest 0 0 0 0 0  Down, Depressed, Hopeless 0 0 0 0 0  PHQ - 2 Score 0 0 0 0 0  Altered sleeping - - 3 3 -  Tired, decreased energy - - 0 0 -  Change in appetite - - 0 0 -  Feeling bad or failure about yourself  - - 0 0 -  Trouble concentrating - - 0 0 -  Moving slowly or fidgety/restless - - 0 0 -  Suicidal thoughts - - 0 0 -  PHQ-9 Score - - 3 3 -  Difficult doing work/chores - - Not difficult at all - -    Allergies  Allergen Reactions  . Celebrex [Celecoxib]   . Cymbalta [Duloxetine Hcl]   . Lyrica [Pregabalin]   . Neurontin [Gabapentin]   . Penicillins     REACTION: hives   Social History   Tobacco Use  . Smoking status: Former Smoker    Packs/day: 3.00    Years: 30.00    Pack years: 90.00    Last attempt to quit: 07/16/2002    Years since quitting: 15.9  . Smokeless tobacco: Never Used  Substance Use Topics  .  Alcohol use: Yes    Alcohol/week: 18.0 standard drinks    Types: 18 Cans of beer per week    Comment: socially   Past Medical History:  Diagnosis Date  . Arm fracture    left. Dx'd w/RSD post fracture  . Carpal tunnel syndrome   . Chest pain 2016   [facility] (Dr. Matthew Saras) doing stress echo, but feels like musculoskeletal chest wall pain is cause of his discomfort  . Deviated septum   . Diverticulosis   . GERD (gastroesophageal reflux disease)   . Hyperlipidemia   . Hypertension   . Nephrolithiasis   . Plantar fasciitis   . Pulmonary nodule 04/2016   [facility].  Pt chose to repeat CT chest w/out contrast in 3 mo---as per radiologist's recommendations.  . Right inguinal hernia    fat-containing.  Small hydrocele in left hemiscrotum.  . Snoring   . Tobacco dependence in remission    Past Surgical History:  Procedure Laterality Date  . BACK SURGERY  1993  . COLONOSCOPY    . INGUINAL HERNIA REPAIR Left 2013   repeat hernia repair. x2  . KNEE ARTHROSCOPY Right   . UPPER GI ENDOSCOPY     Family History  Problem Relation Age of Onset  . Lung cancer Mother   . Lung cancer Father   .  Bone cancer Brother        bone  . Heart disease Maternal Uncle   . Colon cancer Neg Hx    Allergies as of 06/19/2018      Reactions   Celebrex [celecoxib]    Cymbalta [duloxetine Hcl]    Lyrica [pregabalin]    Neurontin [gabapentin]    Penicillins    REACTION: hives      Medication List        Accurate as of 06/19/18 11:59 PM. Always use your most recent med list.          amLODipine 10 MG tablet Commonly known as:  NORVASC Take 1 tablet (10 mg total) by mouth daily.   aspirin 81 MG tablet Take 81 mg by mouth daily.   pantoprazole 40 MG tablet Commonly known as:  PROTONIX Take 1 tablet (40 mg total) by mouth daily.   simvastatin 10 MG tablet Commonly known as:  ZOCOR Take 1 tablet (10 mg total) by mouth every other day.       All past medical history, surgical  history, allergies, family history, immunizations andmedications were updated in the EMR today and reviewed under the history and medication portions of their EMR.     ROS: Negative, with the exception of above mentioned in HPI   Objective:  BP 128/73 (BP Location: Left Arm, Patient Position: Sitting, Cuff Size: Large)   Pulse 77   Temp 97.7 F (36.5 C)   Resp 20   Ht '6\' 2"'$  (1.88 m)   Wt 232 lb (105.2 kg)   SpO2 100%   BMI 29.79 kg/m  Body mass index is 29.79 kg/m. Gen: Afebrile. No acute distress.  Nontoxic and presentation.  Overweight, pleasant Caucasian male.  HENT: AT. [place]. MMM. Eyes:Pupils Equal Round Reactive to light, Extraocular movements intact,  Conjunctiva without redness, discharge or icterus. Neck/lymp/endocrine: Supple, no lymphadenopathy, no thyromegaly CV: RRR 1/6 systolic murmur, no edema, +2/4 P posterior tibialis pulses Chest: CTAB, no wheeze or crackles Abd: Soft. NTND. BS +.  No masses palpated.  Skin: No rashes, purpura or petechiae.  Neuro:  Normal gait. PERLA. EOMi. Alert. Oriented.  Psych: Normal affect, dress and demeanor. Normal speech. Normal thought content and judgment..    No exam data present No results found. No results found for this or any previous visit (from the past 24 hour(s)).  Assessment/Plan: Nicholas Graham is a 67 y.o. male present for OV for  Essential hypertension, benign/dyslipidemia/overweight -Well-controlled on amlodipine 10 mg daily.  Refills provided today.  Refills on Zocor 10 mg daily.  Well controlled on Amlodipine 10 mg QD. Refills provided today. - continue fish oil. - continue ASA 81 mg - routine diet and exercise.  - F/U 6 months.    Elevated hemoglobin A1c/glucose - HgB A1c 5.8 in the past. Elevated glucose 124  Gastroesophageal reflux disease with esophagitis Stable. Refills provided on Protonix.  - pantoprazole (PROTONIX) 40 MG tablet; Take 1 tablet (40 mg total) by mouth daily.  Dispense: 90 tablet; Refill:  3    Reviewed expectations re: course of current medical issues.  Discussed self-management of symptoms.  Outlined signs and symptoms indicating need for more acute intervention.  Patient verbalized understanding and all questions were answered.  Patient received an After-Visit Summary.    Orders Placed This Encounter  Procedures  . CBC w/Diff  . Comp Met (CMET)  . Lipid panel  . HgB A1c     Note is dictated utilizing voice recognition software.  Although note has been proof read prior to signing, occasional typographical errors still can be missed. If any questions arise, please do not hesitate to call for verification.   electronically signed by:  Howard Pouch, DO  [place]

## 2018-06-19 NOTE — Telephone Encounter (Signed)
Spoke to this pt he is aware [facility] has been notified of the cpap study and I am waiting on the auth pt would like a call once it has been authorized Joellen Jersey

## 2018-06-19 NOTE — Patient Instructions (Addendum)
It was good to see you today. Your BP looks good.  We will call you with lab results once available.  Keep  Feet elevated when able, compression stockings are helpful. Low sodium.  Follow up in 6 months.       Please help [place] help you:  We are honored you have chosen [facility] for your Primary Care home. Below you will find basic instructions that you may need to access in the future. Please help [place] help you by reading the instructions, which cover many of the frequent questions we experience.   Prescription refills and request:  -In order to allow more efficient response time, please call your pharmacy for all refills. They will forward the request electronically to [place]. This allows for the quickest possible response. Request left on a nurse line can take longer to refill, since these are checked as time allows between office patients and other phone calls.  - refill request can take up to 3-5 working days to complete.  - If request is sent electronically and request is appropiate, it is usually completed in 1-2 business days.  - all patients will need to be seen routinely for all chronic medical conditions requiring prescription medications (see follow-up below). If you are overdue for follow up on your condition, you will be asked to make an appointment and we will call in enough medication to cover you until your appointment (up to 30 days).  - all controlled substances will require a face to face visit to request/refill.  - if you desire your prescriptions to go through a new pharmacy, and have an active script at original pharmacy, you will need to call your pharmacy and have scripts transferred to new pharmacy. This is completed between the pharmacy locations and not by your provider.    Results: If any images or labs were ordered, it can take up to 1 week to get results depending on the test ordered and the lab/facility running and resulting the test. - Normal or stable results,  which do not need further discussion, may be released to your mychart immediately with attached note to you. A call may not be generated for normal results. Please make certain to sign up for mychart. If you have questions on how to activate your mychart you can call the front office.  - If your results need further discussion, our office will attempt to contact you via phone, and if unable to reach you after 2 attempts, we will release your abnormal result to your mychart with instructions.  - All results will be automatically released in mychart after 1 week.  - Your provider will provide you with explanation and instruction on all relevant material in your results. Please keep in mind, results and labs may appear confusing or abnormal to the untrained eye, but it does not mean they are actually abnormal for you personally. If you have any questions about your results that are not covered, or you desire more detailed explanation than what was provided, you should make an appointment with your provider to do so.   Our office handles many outgoing and incoming calls daily. If we have not contacted you within 1 week about your results, please check your mychart to see if there is a message first and if not, then contact our office.  In helping with this matter, you help decrease call volume, and therefore allow [place] to be able to respond to patients needs more efficiently.   Acute  office visits (sick visit):  An acute visit is intended for a new problem and are scheduled in shorter time slots to allow schedule openings for patients with new problems. This is the appropriate visit to discuss a new problem. Problems will not be addressed by phone call or Echart message. Appointment is needed if requesting treatment. In order to provide you with excellent quality medical care with proper time for you to explain your problem, have an exam and receive treatment with instructions, these appointments should be limited  to one new problem per visit. If you experience a new problem, in which you desire to be addressed, please make an acute office visit, we save openings on the schedule to accommodate you. Please do not save your new problem for any other type of visit, let us take care of it properly and quickly for you.   Follow up visits:  Depending on your condition(s) your provider will need to see you routinely in order to provide you with quality care and prescribe medication(s). Most chronic conditions (Example: hypertension, Diabetes, depression/anxiety... etc), require visits a couple times a year. Your provider will instruct you on proper follow up for your personal medical conditions and history. Please make certain to make follow up appointments for your condition as instructed. Failing to do so could result in lapse in your medication treatment/refills. If you request a refill, and are overdue to be seen on a condition, we will always provide you with a 30 day script (once) to allow you time to schedule.    Medicare wellness (well visit): - we have a wonderful Nurse Maudie Mercury), that will meet with you and provide you will yearly medicare wellness visits. These visits should occur yearly (can not be scheduled less than 1 calendar year apart) and cover preventive health, immunizations, advance directives and screenings you are entitled to yearly through your medicare benefits. Do not miss out on your entitled benefits, this is when medicare will pay for these benefits to be ordered for you.  These are strongly encouraged by your provider and is the appropriate type of visit to make certain you are up to date with all preventive health benefits. If you have not had your medicare wellness exam in the last 12 months, please make certain to schedule one by calling the office and schedule your medicare wellness with Maudie Mercury as soon as possible.   Yearly physical (well visit):  - Adults are recommended to be seen yearly for  physicals. Check with your insurance and date of your last physical, most insurances require one calendar year between physicals. Physicals include all preventive health topics, screenings, medical exam and labs that are appropriate for gender/age and history. You may have fasting labs needed at this visit. This is a well visit (not a sick visit), new problems should not be covered during this visit (see acute visit).  - Pediatric patients are seen more frequently when they are younger. Your provider will advise you on well child visit timing that is appropriate for your their age. - This is not a medicare wellness visit. Medicare wellness exams do not have an exam portion to the visit. Some medicare companies allow for a physical, some do not allow a yearly physical. If your medicare allows a yearly physical you can schedule the medicare wellness with our nurse Maudie Mercury and have your physical with your provider after, on the same day. Please check with insurance for your full benefits.   Late Policy/No Shows:  -  all new patients should arrive 15-30 minutes earlier than appointment to allow [place] time  to  obtain all personal demographics,  insurance information and for you to complete office paperwork. - All established patients should arrive 10-15 minutes earlier than appointment time to update all information and be checked in .  - In our best efforts to run on time, if you are late for your appointment you will be asked to either reschedule or if able, we will work you back into the schedule. There will be a wait time to work you back in the schedule,  depending on availability.  - If you are unable to make it to your appointment as scheduled, please call 24 hours ahead of time to allow [place] to fill the time slot with someone else who needs to be seen. If you do not cancel your appointment ahead of time, you may be charged a no show fee.

## 2018-06-19 NOTE — Telephone Encounter (Signed)
Spoke with pt to receive the insurance information.  [employer] #[id number]  Healthplan:  [phone]  PCC's could you tell by this information if they will cover CPAP titration study?

## 2018-06-23 ENCOUNTER — Telehealth: Payer: Self-pay | Admitting: Pulmonary Disease

## 2018-06-23 ENCOUNTER — Encounter: Payer: Self-pay | Admitting: Family Medicine

## 2018-06-23 NOTE — Telephone Encounter (Signed)
Spoke with pt's wife. She is wanting a call today from a [facility] about whether or not the pt's new insurance is going to cover his CPAP titration study on 06/26/18.  [facility] - please contact the pt's wife today. Thanks.

## 2018-06-23 NOTE — Telephone Encounter (Signed)
Returned call to Patient.  Left message on answering machine to call back.

## 2018-06-23 NOTE — Telephone Encounter (Signed)
Spoke to wife I can not gurantee [facility] will be paid however I did get an auth for it wife was ok with this info Nicholas Graham

## 2018-06-23 NOTE — Telephone Encounter (Signed)
Pt is wife is calling back [phone]

## 2018-06-26 ENCOUNTER — Ambulatory Visit (HOSPITAL_BASED_OUTPATIENT_CLINIC_OR_DEPARTMENT_OTHER): Payer: PPO | Attending: Pulmonary Disease | Admitting: Pulmonary Disease

## 2018-06-26 VITALS — Ht 74.0 in | Wt 225.0 lb

## 2018-06-26 DIAGNOSIS — G4733 Obstructive sleep apnea (adult) (pediatric): Secondary | ICD-10-CM | POA: Diagnosis not present

## 2018-06-27 ENCOUNTER — Telehealth: Payer: Self-pay | Admitting: Pulmonary Disease

## 2018-06-27 DIAGNOSIS — G4733 Obstructive sleep apnea (adult) (pediatric): Secondary | ICD-10-CM

## 2018-06-27 NOTE — Telephone Encounter (Signed)
Curt Bears returning call (wife), [phone]

## 2018-06-27 NOTE — Telephone Encounter (Signed)
Please ask sleep lab  [phone] to send this titration study to my box

## 2018-06-27 NOTE — Telephone Encounter (Signed)
LMTCB

## 2018-06-27 NOTE — Telephone Encounter (Signed)
Called and spoke with Patient's wife, Juliann Pulse.  She stated that the Patient had enough test and Dr. Elsworth Soho told both the Patient and his wife, Juliann Pulse, that they both needed a CPAP machine.  Juliann Pulse stated the Patient had a sleep test done 06/26/18, at [facility].  Explained that had needed a CPAP titration, per Dr. Ander Slade. She stated they never saw him and Dr. Elsworth Soho was who told them they needed a CPAP.  Juliann Pulse stated she had not spoke with anyone at this office about a any further test.  06/23/18 telephone note, [facility] spoke with her about CPAP titration study 06/26/18.  CPAP titration She request a CPAP for the Patient and herself.    Will route to Dr. Elsworth Soho

## 2018-06-30 NOTE — Telephone Encounter (Signed)
Called and let sleep lab know and they sent to your box Dr. Elsworth Soho, will route as an [place]

## 2018-07-01 NOTE — Telephone Encounter (Signed)
Patient's wife Juliann Pulse calling very upset that patient does not have his CPAP machine yet.  She is asking for CB, phone# [phone].

## 2018-07-01 NOTE — Telephone Encounter (Signed)
Spoke with pt's wife and advised her as soon as we get the pressure setting from Dr. Elsworth Soho, then we can send the order in. She agreed and will await Dr. Bari Mantis response.

## 2018-07-01 NOTE — Telephone Encounter (Signed)
Order placed. PCC's can you let us know when it is actually faxed and confirmation received that advanced received the order. Thanks. Then we can call and let pt's wife know.

## 2018-07-01 NOTE — Telephone Encounter (Signed)
I have sent this via a community message to [facility].

## 2018-07-01 NOTE — Telephone Encounter (Signed)
Auto CPAP 10 to 18 cm with large full facemask. Office visit in 6 weeks

## 2018-07-02 NOTE — Telephone Encounter (Signed)
This was received by [facility].

## 2018-07-04 ENCOUNTER — Encounter: Payer: Self-pay | Admitting: Family Medicine

## 2018-07-04 DIAGNOSIS — Z9989 Dependence on other enabling machines and devices: Secondary | ICD-10-CM

## 2018-07-04 DIAGNOSIS — G4733 Obstructive sleep apnea (adult) (pediatric): Secondary | ICD-10-CM

## 2018-07-04 HISTORY — DX: Obstructive sleep apnea (adult) (pediatric): Z99.89

## 2018-07-04 HISTORY — DX: Obstructive sleep apnea (adult) (pediatric): G47.33

## 2018-07-04 NOTE — Procedures (Signed)
  Patient Name: Nicholas Graham, Nicholas Graham Date: 06/26/2018   Gender: Male  D.O.B: 13-May-1951  Age (years): 70  Referring Provider: Laurin Coder MD  Height (inches): 74  Interpreting Physician: Kara Mead MD, ABSM  Weight (lbs): 225  RPSGT: Zadie Rhine  BMI: 29  MRN: [id number]  Neck Size: 17.00  <br> <br>  CLINICAL INFORMATION  The patient is referred for a CPAP titration to treat sleep apnea. Date of HST: 05/2018, AHI 57/h SLEEP STUDY TECHNIQUE  As per the AASM Manual for the Scoring of Sleep and Associated Events v2.3 (April 2016) with a hypopnea requiring 4% desaturations. The channels recorded and monitored were frontal, central and occipital EEG, electrooculogram (EOG), submentalis EMG (chin), nasal and oral airflow, thoracic and abdominal wall motion, anterior tibialis EMG, snore microphone, electrocardiogram, and pulse oximetry. Continuous positive airway pressure (CPAP) was initiated at the beginning of the study and titrated to treat sleep-disordered breathing. MEDICATIONS  Medications self-administered by patient taken the night of the study : N/A TECHNICIAN COMMENTS  Comments added by technician: NO RESTROOM VISTED   RESPIRATORY PARAMETERS  Optimal PAP Pressure (cm): 17 AHI at Optimal Pressure (/hr): 0.0  Overall Minimal O2 (%): 77.0 Supine % at Optimal Pressure (%): 100  Minimal O2 at Optimal Pressure (%): 93.0       SLEEP ARCHITECTURE  The study was initiated at 9:58:48 PM and ended at 4:01:45 AM. Sleep onset time was 14.4 minutes and the sleep efficiency was 90.2%%. The total sleep time was 327.5 minutes. The patient spent 2.3%% of the night in stage N1 sleep, 79.2%% in stage N2 sleep, 0.0%% in stage N3 and 18.5% in REM.Stage REM latency was 70.5 minutes Wake after sleep onset was 21.0. Alpha intrusion was absent. Supine sleep was 75.09%. CARDIAC DATA  The 2 lead EKG demonstrated sinus rhythm. The mean heart rate was 68.3 beats per minute. Other EKG findings  include: None.  LEG MOVEMENT DATA  The total Periodic Limb Movements of Sleep (PLMS) were 0. The PLMS index was 0.0. A PLMS index of <15 is considered normal in adults. IMPRESSIONS  - The optimal PAP pressure was 17 cm of water.  - Central sleep apnea was not noted during this titration (CAI = 0.0/h).  - Severe oxygen desaturations were observed during this titration (min O2 = 77.0%).  - No snoring was audible during this study.  - No cardiac abnormalities were observed during this study.  - Clinically significant periodic limb movements were not noted during this study. Arousals associated with PLMs were rare. DIAGNOSIS  - Obstructive Sleep Apnea (327.23 [G47.33 ICD-10]) RECOMMENDATIONS  - Trial of CPAP therapy on 17 cm H2O with a Large size Fisher&Paykel Full Face Mask Simplus mask and heated humidification. - Avoid alcohol, sedatives and other CNS depressants that may worsen sleep apnea and disrupt normal sleep architecture.  - Sleep hygiene should be reviewed to assess factors that may improve sleep quality.  - Weight management and regular exercise should be initiated or continued.  - Return to Sleep Center for re-evaluation after 4 weeks of therapy   Kara Mead MD Board Certified in [place]

## 2018-07-06 DIAGNOSIS — Z87891 Personal history of nicotine dependence: Secondary | ICD-10-CM | POA: Diagnosis not present

## 2018-07-06 DIAGNOSIS — Z888 Allergy status to other drugs, medicaments and biological substances status: Secondary | ICD-10-CM | POA: Diagnosis not present

## 2018-07-06 DIAGNOSIS — Z79899 Other long term (current) drug therapy: Secondary | ICD-10-CM | POA: Diagnosis not present

## 2018-07-06 DIAGNOSIS — E7800 Pure hypercholesterolemia, unspecified: Secondary | ICD-10-CM | POA: Diagnosis not present

## 2018-07-06 DIAGNOSIS — K5732 Diverticulitis of large intestine without perforation or abscess without bleeding: Secondary | ICD-10-CM | POA: Diagnosis not present

## 2018-07-06 DIAGNOSIS — K5792 Diverticulitis of intestine, part unspecified, without perforation or abscess without bleeding: Secondary | ICD-10-CM | POA: Diagnosis not present

## 2018-07-06 DIAGNOSIS — Z7982 Long term (current) use of aspirin: Secondary | ICD-10-CM | POA: Diagnosis not present

## 2018-07-06 DIAGNOSIS — R1031 Right lower quadrant pain: Secondary | ICD-10-CM | POA: Diagnosis not present

## 2018-07-06 DIAGNOSIS — I10 Essential (primary) hypertension: Secondary | ICD-10-CM | POA: Diagnosis not present

## 2018-07-06 DIAGNOSIS — Z880 Allergy status to penicillin: Secondary | ICD-10-CM | POA: Diagnosis not present

## 2018-07-06 LAB — CBC AND DIFFERENTIAL
HCT: 47 (ref 41–53)
Hemoglobin: 16.1 (ref 13.5–17.5)
NEUTROS ABS: 1
PLATELETS: 171 (ref 150–399)
WBC: 16.4

## 2018-07-06 LAB — BASIC METABOLIC PANEL
BUN: 17 (ref 4–21)
CREATININE: 1 (ref 0.6–1.3)
GLUCOSE: 124
Potassium: 3.5 (ref 3.4–5.3)
Sodium: 138 (ref 137–147)

## 2018-07-06 LAB — HEPATIC FUNCTION PANEL
ALT: 18 (ref 10–40)
AST: 23 (ref 14–40)
Alkaline Phosphatase: 79 (ref 25–125)
BILIRUBIN, TOTAL: 0.5

## 2018-07-07 ENCOUNTER — Encounter: Payer: Self-pay | Admitting: Family Medicine

## 2018-07-09 ENCOUNTER — Telehealth: Payer: Self-pay | Admitting: Pulmonary Disease

## 2018-07-09 NOTE — Telephone Encounter (Signed)
Okay for him to pick up machine  settings as prescribed  Early follow-up with compliance monitoring

## 2018-07-09 NOTE — Telephone Encounter (Signed)
Called patient unable to reach left message to give us a call back.

## 2018-07-11 ENCOUNTER — Ambulatory Visit (INDEPENDENT_AMBULATORY_CARE_PROVIDER_SITE_OTHER): Payer: PPO | Admitting: Family Medicine

## 2018-07-11 ENCOUNTER — Encounter: Payer: Self-pay | Admitting: Family Medicine

## 2018-07-11 VITALS — BP 133/83 | HR 89 | Temp 97.7°F | Resp 20 | Ht 74.0 in | Wt 232.0 lb

## 2018-07-11 DIAGNOSIS — R1031 Right lower quadrant pain: Secondary | ICD-10-CM | POA: Diagnosis not present

## 2018-07-11 DIAGNOSIS — K5732 Diverticulitis of large intestine without perforation or abscess without bleeding: Secondary | ICD-10-CM | POA: Diagnosis not present

## 2018-07-11 DIAGNOSIS — I10 Essential (primary) hypertension: Secondary | ICD-10-CM | POA: Diagnosis not present

## 2018-07-11 DIAGNOSIS — K5792 Diverticulitis of intestine, part unspecified, without perforation or abscess without bleeding: Secondary | ICD-10-CM

## 2018-07-11 DIAGNOSIS — E7800 Pure hypercholesterolemia, unspecified: Secondary | ICD-10-CM | POA: Diagnosis not present

## 2018-07-11 DIAGNOSIS — Z87891 Personal history of nicotine dependence: Secondary | ICD-10-CM | POA: Diagnosis not present

## 2018-07-11 DIAGNOSIS — Z79899 Other long term (current) drug therapy: Secondary | ICD-10-CM | POA: Diagnosis not present

## 2018-07-11 DIAGNOSIS — Z7982 Long term (current) use of aspirin: Secondary | ICD-10-CM | POA: Diagnosis not present

## 2018-07-11 DIAGNOSIS — Z888 Allergy status to other drugs, medicaments and biological substances status: Secondary | ICD-10-CM | POA: Diagnosis not present

## 2018-07-11 DIAGNOSIS — Z886 Allergy status to analgesic agent status: Secondary | ICD-10-CM | POA: Diagnosis not present

## 2018-07-11 DIAGNOSIS — Z880 Allergy status to penicillin: Secondary | ICD-10-CM | POA: Diagnosis not present

## 2018-07-11 MED ORDER — HYDROCODONE-ACETAMINOPHEN 10-325 MG PO TABS: 1.0000 | ORAL_TABLET | Freq: Four times a day (QID) | ORAL | 0 refills | Status: DC | PRN

## 2018-07-11 NOTE — Patient Instructions (Signed)
Please go to [facility] for reevaluation since you are worse. They may need to admit you for IV antibiotic and pain control.     Diverticulitis Diverticulitis is when small pockets in your large intestine (colon) get infected or swollen. This causes stomach pain and watery poop (diarrhea). These pouches are called diverticula. They form in people who have a condition called diverticulosis. Follow these instructions at home: Medicines  Take over-the-counter and prescription medicines only as told by your doctor. These include: ? Antibiotics. ? Pain medicines. ? Fiber pills. ? Probiotics. ? Stool softeners.  Do not drive or use heavy machinery while taking prescription pain medicine.  If you were prescribed an antibiotic, take it as told. Do not stop taking it even if you feel better. General instructions  Follow a diet as told by your doctor.  When you feel better, your doctor may tell you to change your diet. You may need to eat a lot of fiber. Fiber makes it easier to poop (have bowel movements). Healthy foods with fiber include: ? Berries. ? Beans. ? Lentils. ? Green vegetables.  Exercise 3 or more times a week. Aim for 30 minutes each time. Exercise enough to sweat and make your heart beat faster.  Keep all follow-up visits as told. This is important. You may need to have an exam of the large intestine. This is called a colonoscopy. Contact a doctor if:  Your pain does not get better.  You have a hard time eating or drinking.  You are not pooping like normal. Get help right away if:  Your pain gets worse.  Your problems do not get better.  Your problems get worse very fast.  You have a fever.  You throw up (vomit) more than one time.  You have poop that is: ? Bloody. ? Black. ? Tarry. Summary  Diverticulitis is when small pockets in your large intestine (colon) get infected or swollen.  Take medicines only as told by your doctor.  Follow a  diet as told by your doctor. This information is not intended to replace advice given to you by your health care provider. Make sure you discuss any questions you have with your health care provider. Document Released: 02/20/2008 Document Revised: 09/20/2016 Document Reviewed: 09/20/2016 Elsevier Interactive Patient Education  2017 [employer].

## 2018-07-11 NOTE — Progress Notes (Signed)
Nicholas Graham , 12-10-1950, 67 y.o., male MRN: [id number] Patient Care Team    Relationship Specialty Notifications Start End  Nicholas Hillock, DO PCP - General Family Medicine  10/31/15   Nicholas Castle, MD (Inactive) Consulting Physician Gastroenterology  04/24/16     Chief Complaint  Patient presents with  . Abdominal Pain    seen in ED     Subjective: Pt presents for an OV with complaints of RLQ abd pain since Oct 20. He was seen at [facility] ED Oct. 20 and was found to have a rather elevated WBC count ~18 and CT with evidence of sigmoid diverticulitis and wall thickening of the rectum. Patient reports he was given cipro/flagyl and percocet for treatment. He followed a liquid diet for 48 hours after and then recently started soft. He reports he has had diarrhea the last 2 days. Pain is worse in his RIGHT abdomen. He can not tolerate percocet. He is having extreme difficulty tolerating the flagyl. He has had chills and he has new right sided back pain today. His wife states he is unable to move without pain and she has never seen him this uncomfortable before.   CT 07/06/2018: IMPRESSION: 1.Acute sigmoid diverticulitis without adjacent abscess. Rectal wall thickening may represent concurrent proctitis. Follow-up recommended.  2.1.2 cm pulmonary nodule as detailed above. Dedicated chest CT can be performed for further evaluation. Follow-up recommended.  Depression screen [facility] 2/9 06/19/2018 12/13/2017 11/15/2017 09/12/2017 05/15/2017  Decreased Interest 0 0 0 0 0  Down, Depressed, Hopeless 0 0 0 0 0  PHQ - 2 Score 0 0 0 0 0  Altered sleeping - - 3 3 -  Tired, decreased energy - - 0 0 -  Change in appetite - - 0 0 -  Feeling bad or failure about yourself  - - 0 0 -  Trouble concentrating - - 0 0 -  Moving slowly or fidgety/restless - - 0 0 -  Suicidal thoughts - - 0 0 -  PHQ-9 Score - - 3 3 -  Difficult doing work/chores - - Not difficult at all - -    Allergies    Allergen Reactions  . Celebrex [Celecoxib]   . Cymbalta [Duloxetine Hcl]   . Lyrica [Pregabalin]   . Neurontin [Gabapentin]   . Penicillins     REACTION: hives   Social History   Tobacco Use  . Smoking status: Former Smoker    Packs/day: 3.00    Years: 30.00    Pack years: 90.00    Last attempt to quit: 07/16/2002    Years since quitting: 15.9  . Smokeless tobacco: Never Used  Substance Use Topics  . Alcohol use: Yes    Alcohol/week: 18.0 standard drinks    Types: 18 Cans of beer per week    Comment: socially   Past Medical History:  Diagnosis Date  . Arm fracture    left. Dx'd w/RSD post fracture  . Carpal tunnel syndrome   . Chest pain 2016   [facility] (Dr. Matthew [facility]) doing stress echo, but feels like musculoskeletal chest wall pain is cause of his discomfort  . Deviated septum   . Diverticulosis   . GERD (gastroesophageal reflux disease)   . Hyperlipidemia   . Hypertension   . Nephrolithiasis   . OSA on CPAP 07/04/2018   - Trial of CPAP therapy on 17 cm H2O with a Large size Fisher&Paykel Full Face Mask Simplus mask and heated humidification.  . Plantar  fasciitis   . Pulmonary nodule 04/2016   [place].  Pt chose to repeat CT chest w/out contrast in 3 mo---as per radiologist's recommendations.  . Right inguinal hernia    fat-containing.  Small hydrocele in left hemiscrotum.  . Tobacco dependence in remission    Past Surgical History:  Procedure Laterality Date  . BACK SURGERY  1993  . COLONOSCOPY    . INGUINAL HERNIA REPAIR Left 2013   repeat hernia repair. x2  . KNEE ARTHROSCOPY Right   . UPPER GI ENDOSCOPY     Family History  Problem Relation Age of Onset  . Lung cancer Mother   . Lung cancer Father   . Bone cancer Brother        bone  . Heart disease Maternal Uncle   . Colon cancer Neg Hx    Allergies as of 07/11/2018      Reactions   Celebrex [celecoxib]    Cymbalta [duloxetine Hcl]    Lyrica [pregabalin]    Neurontin [gabapentin]     Penicillins    REACTION: hives      Medication List        Accurate as of 07/11/18  1:24 PM. Always use your most recent med list.          amLODipine 10 MG tablet Commonly known as:  NORVASC Take 1 tablet (10 mg total) by mouth daily.   aspirin 81 MG tablet Take 81 mg by mouth daily.   metroNIDAZOLE 500 MG tablet Commonly known as:  FLAGYL Take 1 tablet by mouth 3 (three) times daily.   oxyCODONE-acetaminophen 5-325 MG tablet Commonly known as:  PERCOCET/ROXICET Take by mouth.   pantoprazole 40 MG tablet Commonly known as:  PROTONIX Take 1 tablet (40 mg total) by mouth daily.   simvastatin 10 MG tablet Commonly known as:  ZOCOR Take 1 tablet (10 mg total) by mouth every other day.       All past medical history, surgical history, allergies, family history, immunizations andmedications were updated in the EMR today and reviewed under the history and medication portions of their EMR.     ROS: Negative, with the exception of above mentioned in HPI   Objective:  BP 133/83 (BP Location: Left Arm, Patient Position: Sitting, Cuff Size: Large)   Pulse 89   Temp 97.7 F (36.5 C)   Resp 20   Ht 6\' 2"  (1.88 m)   Wt 232 lb (105.2 kg)   SpO2 97%   BMI 29.79 kg/m  Body mass index is 29.79 kg/m. Gen: Afebrile. Nontoxic in appearance, well developed, well nourished. Appears to be in a great deal of pain with movement.  HENT: AT. [facility]. MMM, no oral lesions.  Eyes:Pupils Equal Round Reactive to light, Extraocular movements intact,  Conjunctiva without redness, discharge or icterus. CV: RRR Chest: CTAB, no wheeze or crackles. Abd: mildly taut, tissue texture change RLQ to mid abd. Severely TTP RLQ and LLQ. Mildly distended. BS +.  Rebound and guarding present. + peritonitis signs with heel jar test. Skin: no rashes, purpura or petechiae.  Neuro: Guarded gait. Holding abdomen. Walking slow and guarded.  PERLA. EOMi. Alert. Oriented x3  Psych: Normal affect, dress and  demeanor. Normal speech. Normal thought content and judgment.  No exam data present No results found. No results found for this or any previous visit (from the past 24 hour(s)).  Assessment/Plan: Nicholas [facility] is a 67 y.o. male present for OV for  Diverticulitis - Concern for worsening infection  and/or perforation. He has had no improvement over the last 5 days despite treatment w/ cipro/flagyl and has worsening symptoms. Severe pain with palpation of abdomen and movement. He is not able to tolerate the percocet for pain control. He reports the abx severely upset his stomach as well.  - advised him to report back to the ED for evaluation: may need repeat CT, IV ABX and definitely better pain control.  - They are in agreement with plan. [place] [facility] charge nurse and ED nurse aware patient is on his way by car, wife driving.     Reviewed expectations re: course of current medical issues.  Discussed self-management of symptoms.  Outlined signs and symptoms indicating need for more acute intervention.  Patient verbalized understanding and all questions were answered.  Patient received an After-Visit Summary.    No orders of the defined types were placed in this encounter.    Note is dictated utilizing voice recognition software. Although note has been proof read prior to signing, occasional typographical errors still can be missed. If any questions arise, please do not hesitate to call for verification.   electronically signed by:  Howard Pouch, DO  [place]

## 2018-07-18 DIAGNOSIS — G4733 Obstructive sleep apnea (adult) (pediatric): Secondary | ICD-10-CM | POA: Diagnosis not present

## 2018-07-29 ENCOUNTER — Ambulatory Visit: Payer: BLUE CROSS/BLUE SHIELD | Admitting: Pulmonary Disease

## 2018-08-07 ENCOUNTER — Telehealth: Payer: Self-pay

## 2018-08-07 NOTE — Telephone Encounter (Signed)
escitalopram (LEXAPRO) 20 MG tablet [[id number] Take 1 by mouth daily

## 2018-08-07 NOTE — Telephone Encounter (Signed)
Refill request from pharmacy for Lexapro 20 mg. Medication is no longer on his list. Last note stated patient to take Lexapro 40 mg qd. Patient did not do well with Paxil.

## 2018-08-07 NOTE — Telephone Encounter (Signed)
Message left on voice mail for patient to return call to clarify medication.

## 2018-08-07 NOTE — Telephone Encounter (Signed)
Please inform patient the following information: Call pt and clarify. Last visit he was not taking anything. If he is taking lexapro again, I will refill it--> however please tell him he has to use the med daily and stay on the one he chose.

## 2018-08-08 MED ORDER — ESCITALOPRAM OXALATE 20 MG PO TABS: 20.0000 mg | ORAL_TABLET | Freq: Every day | ORAL | 1 refills | Status: DC

## 2018-08-08 NOTE — Telephone Encounter (Signed)
Refilled lexapro 20 mg Qd. Pt advised to use daily and not intermittently.

## 2018-08-08 NOTE — Addendum Note (Signed)
Addended by: Howard Pouch A on: 08/08/2018 07:28 AM   Modules accepted: Orders

## 2018-08-17 DIAGNOSIS — G4733 Obstructive sleep apnea (adult) (pediatric): Secondary | ICD-10-CM | POA: Diagnosis not present

## 2018-08-20 DIAGNOSIS — G4733 Obstructive sleep apnea (adult) (pediatric): Secondary | ICD-10-CM | POA: Diagnosis not present

## 2018-08-27 ENCOUNTER — Ambulatory Visit (INDEPENDENT_AMBULATORY_CARE_PROVIDER_SITE_OTHER): Payer: PPO | Admitting: Pulmonary Disease

## 2018-08-27 ENCOUNTER — Encounter: Payer: Self-pay | Admitting: Pulmonary Disease

## 2018-08-27 DIAGNOSIS — Z9989 Dependence on other enabling machines and devices: Secondary | ICD-10-CM | POA: Diagnosis not present

## 2018-08-27 DIAGNOSIS — I10 Essential (primary) hypertension: Secondary | ICD-10-CM

## 2018-08-27 DIAGNOSIS — G4733 Obstructive sleep apnea (adult) (pediatric): Secondary | ICD-10-CM | POA: Diagnosis not present

## 2018-08-27 NOTE — Assessment & Plan Note (Signed)
Sleeping is much improved with CPAP, he is compliant, he is having REM rebound per history.  CPAP download confirms compliance. We will decrease auto CPAP settings to 10 to 16 cm and recheck download.  Large leak may be related to his beard but hopefully decreasing pressure will help  Weight loss encouraged, compliance with goal of at least 4-6 hrs every night is the expectation. Advised against medications with sedative side effects Cautioned against driving when sleepy - understanding that sleepiness will vary on a day to day basis

## 2018-08-27 NOTE — Progress Notes (Signed)
   Subjective:    Patient ID: Nicholas [facility], male    DOB: 05-30-1951, 67 y.o.   MRN: [id number]  HPI  Chief Complaint  Patient presents with  . Follow-up    f/u for CPAP. Per patient, he is able to sleep well while using machine. States his pressure is too high and blows his mask off at night.    67 year old for follow-up of severe OSA.  We reviewed his baseline study and titration study in detail.  Based on this he was started on Auto CPAP 10 to 18 cm with large full facemask.-He initially tried the effort of 30 fullface mask but did not like this and transition to Fisher-Paykel He likes his better. Overall he is sleeping is much improved, he goes into deep sleep, denies any problems with dryness.  His only complaint is that at night sometimes the pressure is too high and this blouse the mask off his face. CPAP download was reviewed which shows excellent control of events average pressure 15 cm in maximum pressure of 16.6, mild leak  Significant tests/ events reviewed  HST 05/2018 severe OSA, AHI 57/hour, lowest desaturation 67%  CPAP 06/2018 17 cm  CT chest without contrast 04/2016 showed calcified granulomas and calcified mediastinal lymphadenopathy with a 7 x 8 mm nodule in the left mid lung which had a speck of calcification eccentric.  He was told this was benign.  Review of Systems Patient denies significant dyspnea,cough, hemoptysis,  chest pain, palpitations, pedal edema, orthopnea, paroxysmal nocturnal dyspnea, lightheadedness, nausea, vomiting, abdominal or  leg pains      Objective:   Physical Exam  Gen. Pleasant, tall, in no distress ENT - no lesions, no post nasal drip, large beard, class 3 airway Neck: No JVD, no thyromegaly, no carotid bruits Lungs: no use of accessory muscles, no dullness to percussion, decreased without rales or rhonchi  Cardiovascular: Rhythm regular, heart sounds  normal, no murmurs or gallops, no peripheral edema Musculoskeletal: No  deformities, no cyanosis or clubbing , no tremors       Assessment & Plan:

## 2018-08-27 NOTE — Patient Instructions (Signed)
You are having good results with CPAP. Decrease auto settings 10 to 16 cm

## 2018-08-27 NOTE — Assessment & Plan Note (Signed)
Well-controlled on 2 medications. Pedal edema resolved with HCTZ

## 2018-09-08 ENCOUNTER — Telehealth: Payer: Self-pay | Admitting: Pulmonary Disease

## 2018-09-08 DIAGNOSIS — G4733 Obstructive sleep apnea (adult) (pediatric): Secondary | ICD-10-CM

## 2018-09-08 DIAGNOSIS — Z9989 Dependence on other enabling machines and devices: Secondary | ICD-10-CM

## 2018-09-08 NOTE — Telephone Encounter (Signed)
At pt's last OV with RA, RA had said for settings to be decreased to auto settings 10 to 16cm.  Checked to see if an order was placed at pt's OV and I did not see where one was placed for pt's settings to be changed.  I have placed an order to [facility] for pt's settings to be changed.   Called and spoke with pt's wife [name] letting her know this had been done for pt. [name] expressed understanding. Nothing further needed.

## 2018-09-17 DIAGNOSIS — G4733 Obstructive sleep apnea (adult) (pediatric): Secondary | ICD-10-CM | POA: Diagnosis not present

## 2018-10-20 DIAGNOSIS — G4733 Obstructive sleep apnea (adult) (pediatric): Secondary | ICD-10-CM | POA: Diagnosis not present

## 2018-11-06 ENCOUNTER — Telehealth: Payer: Self-pay

## 2018-11-06 ENCOUNTER — Other Ambulatory Visit: Payer: Self-pay

## 2018-11-06 ENCOUNTER — Ambulatory Visit (INDEPENDENT_AMBULATORY_CARE_PROVIDER_SITE_OTHER): Payer: PPO | Admitting: Family Medicine

## 2018-11-06 ENCOUNTER — Encounter: Payer: Self-pay | Admitting: Family Medicine

## 2018-11-06 VITALS — BP 122/80 | HR 87 | Temp 97.7°F | Resp 16 | Ht 74.0 in | Wt 232.0 lb

## 2018-11-06 DIAGNOSIS — M546 Pain in thoracic spine: Secondary | ICD-10-CM | POA: Diagnosis not present

## 2018-11-06 DIAGNOSIS — M545 Low back pain, unspecified: Secondary | ICD-10-CM

## 2018-11-06 MED ORDER — CYCLOBENZAPRINE HCL 10 MG PO TABS: 10.0000 mg | ORAL_TABLET | Freq: Three times a day (TID) | ORAL | 0 refills | Status: DC | PRN

## 2018-11-06 MED ORDER — DICLOFENAC SODIUM 75 MG PO TBEC: 75.0000 mg | DELAYED_RELEASE_TABLET | Freq: Two times a day (BID) | ORAL | 0 refills | Status: DC

## 2018-11-06 MED ORDER — OXYCODONE-ACETAMINOPHEN 10-325 MG PO TABS: 1.0000 | ORAL_TABLET | Freq: Three times a day (TID) | ORAL | 0 refills | Status: DC | PRN

## 2018-11-06 NOTE — Patient Instructions (Signed)
Please make an appointment to follow up with Dr. Raoul Pitch in the next 1-2 weeks.   If you have any questions or concerns, please don't hesitate to send me a message via MyChart or call the office at [phone]. Thank you for visiting with [place] today! It's our pleasure caring for you.   Acute Back Pain, Adult Acute back pain is sudden and usually short-lived. It is often caused by an injury to the muscles and tissues in the back. The injury may result from:  A muscle or ligament getting overstretched or torn (strained). Ligaments are tissues that connect bones to each other. Lifting something improperly can cause a back strain.  Wear and tear (degeneration) of the spinal disks. Spinal disks are circular tissue that provides cushioning between the bones of the spine (vertebrae).  Twisting motions, such as while playing sports or doing yard work.  A hit to the back.  Arthritis. You may have a physical exam, lab tests, and imaging tests to find the cause of your pain. Acute back pain usually goes away with rest and home care. Follow these instructions at home: Managing pain, stiffness, and swelling  Take over-the-counter and prescription medicines only as told by your health care provider.  Your health care provider may recommend applying ice during the first 24-48 hours after your pain starts. To do this: ? Put ice in a plastic bag. ? Place a towel between your skin and the bag. ? Leave the ice on for 20 minutes, 2-3 times a day.  If directed, apply heat to the affected area as often as told by your health care provider. Use the heat source that your health care provider recommends, such as a moist heat pack or a heating pad. ? Place a towel between your skin and the heat source. ? Leave the heat on for 20-30 minutes. ? Remove the heat if your skin turns bright red. This is especially important if you are unable to feel pain, heat, or cold. You have a greater risk of getting  burned. Activity   Do not stay in bed. Staying in bed for more than 1-2 days can delay your recovery.  Sit up and stand up straight. Avoid leaning forward when you sit, or hunching over when you stand. ? If you work at a desk, sit close to it so you do not need to lean over. Keep your chin tucked in. Keep your neck drawn back, and keep your elbows bent at a right angle. Your arms should look like the letter "L." ? Sit high and close to the steering wheel when you drive. Add lower back (lumbar) support to your car seat, if needed.  Take short walks on even surfaces as soon as you are able. Try to increase the length of time you walk each day.  Do not sit, drive, or stand in one place for more than 30 minutes at a time. Sitting or standing for long periods of time can put stress on your back.  Do not drive or use heavy machinery while taking prescription pain medicine.  Use proper lifting techniques. When you bend and lift, use positions that put less stress on your back: ? Jennings your knees. ? Keep the load close to your body. ? Avoid twisting.  Exercise regularly as told by your health care provider. Exercising helps your back heal faster and helps prevent back injuries by keeping muscles strong and flexible.  Work with a physical therapist to make a safe exercise  program, as recommended by your health care provider. Do any exercises as told by your physical therapist. Lifestyle  Maintain a healthy weight. Extra weight puts stress on your back and makes it difficult to have good posture.  Avoid activities or situations that make you feel anxious or stressed. Stress and anxiety increase muscle tension and can make back pain worse. Learn ways to manage anxiety and stress, such as through exercise. General instructions  Sleep on a firm mattress in a comfortable position. Try lying on your side with your knees slightly bent. If you lie on your back, put a pillow under your knees.  Follow  your treatment plan as told by your health care provider. This may include: ? Cognitive or behavioral therapy. ? Acupuncture or massage therapy. ? Meditation or yoga. Contact a health care provider if:  You have pain that is not relieved with rest or medicine.  You have increasing pain going down into your legs or buttocks.  Your pain does not improve after 2 weeks.  You have pain at night.  You lose weight without trying.  You have a fever or chills. Get help right away if:  You develop new bowel or bladder control problems.  You have unusual weakness or numbness in your arms or legs.  You develop nausea or vomiting.  You develop abdominal pain.  You feel faint. Summary  Acute back pain is sudden and usually short-lived.  Use proper lifting techniques. When you bend and lift, use positions that put less stress on your back.  Take over-the-counter and prescription medicines and apply heat or ice as directed by your health care provider. This information is not intended to replace advice given to you by your health care provider. Make sure you discuss any questions you have with your health care provider. Document Released: 09/03/2005 Document Revised: 04/10/2018 Document Reviewed: 04/17/2017 Elsevier Interactive Patient Education  2019 [employer]   Back Exercises The following exercises strengthen the muscles that help to support the back. They also help to keep the lower back flexible. Doing these exercises can help to prevent back pain or lessen existing pain. If you have back pain or discomfort, try doing these exercises 2-3 times each day or as told by your health care provider. When the pain goes away, do them once each day, but increase the number of times that you repeat the steps for each exercise (do more repetitions). If you do not have back pain or discomfort, do these exercises once each day or as told by your health care provider. Exercises Single Knee to  Chest Repeat these steps 3-5 times for each leg: 1. Lie on your back on a firm bed or the floor with your legs extended. 2. Bring one knee to your chest. Your other leg should stay extended and in contact with the floor. 3. Hold your knee in place by grabbing your knee or thigh. 4. Pull on your knee until you feel a gentle stretch in your lower back. 5. Hold the stretch for 10-30 seconds. 6. Slowly release and straighten your leg. Pelvic Tilt Repeat these steps 5-10 times: 1. Lie on your back on a firm bed or the floor with your legs extended. 2. Bend your knees so they are pointing toward the ceiling and your feet are flat on the floor. 3. Tighten your lower abdominal muscles to press your lower back against the floor. This motion will tilt your pelvis so your tailbone points up toward the ceiling  instead of pointing to your feet or the floor. 4. With gentle tension and even breathing, hold this position for 5-10 seconds. Cat-Cow Repeat these steps until your lower back becomes more flexible: 1. Get into a hands-and-knees position on a firm surface. Keep your hands under your shoulders, and keep your knees under your hips. You may place padding under your knees for comfort. 2. Let your head hang down, and point your tailbone toward the floor so your lower back becomes rounded like the back of a cat. 3. Hold this position for 5 seconds. 4. Slowly lift your head and point your tailbone up toward the ceiling so your back forms a sagging arch like the back of a cow. 5. Hold this position for 5 seconds.  Press-Ups Repeat these steps 5-10 times: 1. Lie on your abdomen (face-down) on the floor. 2. Place your palms near your head, about shoulder-width apart. 3. While you keep your back as relaxed as possible and keep your hips on the floor, slowly straighten your arms to raise the top half of your body and lift your shoulders. Do not use your back muscles to raise your upper torso. You may adjust  the placement of your hands to make yourself more comfortable. 4. Hold this position for 5 seconds while you keep your back relaxed. 5. Slowly return to lying flat on the floor.  Bridges Repeat these steps 10 times: 1. Lie on your back on a firm surface. 2. Bend your knees so they are pointing toward the ceiling and your feet are flat on the floor. 3. Tighten your buttocks muscles and lift your buttocks off of the floor until your waist is at almost the same height as your knees. You should feel the muscles working in your buttocks and the back of your thighs. If you do not feel these muscles, slide your feet 1-2 inches farther away from your buttocks. 4. Hold this position for 3-5 seconds. 5. Slowly lower your hips to the starting position, and allow your buttocks muscles to relax completely. If this exercise is too easy, try doing it with your arms crossed over your chest. Abdominal Crunches Repeat these steps 5-10 times: 1. Lie on your back on a firm bed or the floor with your legs extended. 2. Bend your knees so they are pointing toward the ceiling and your feet are flat on the floor. 3. Cross your arms over your chest. 4. Tip your chin slightly toward your chest without bending your neck. 5. Tighten your abdominal muscles and slowly raise your trunk (torso) high enough to lift your shoulder blades a tiny bit off of the floor. Avoid raising your torso higher than that, because it can put too much stress on your low back and it does not help to strengthen your abdominal muscles. 6. Slowly return to your starting position. Back Lifts Repeat these steps 5-10 times: 1. Lie on your abdomen (face-down) with your arms at your sides, and rest your forehead on the floor. 2. Tighten the muscles in your legs and your buttocks. 3. Slowly lift your chest off of the floor while you keep your hips pressed to the floor. Keep the back of your head in line with the curve in your back. Your eyes should be  looking at the floor. 4. Hold this position for 3-5 seconds. 5. Slowly return to your starting position. Contact a health care provider if:  Your back pain or discomfort gets much worse when you do an exercise.  Your back pain or discomfort does not lessen within 2 hours after you exercise. If you have any of these problems, stop doing these exercises right away. Do not do them again unless your health care provider says that you can. Get help right away if:  You develop sudden, severe back pain. If this happens, stop doing the exercises right away. Do not do them again unless your health care provider says that you can. This information is not intended to replace advice given to you by your health care provider. Make sure you discuss any questions you have with your health care provider. Document Released: 10/11/2004 Document Revised: 01/07/2018 Document Reviewed: 10/28/2014 Elsevier Interactive Patient Education  [employer].

## 2018-11-06 NOTE — Telephone Encounter (Signed)
Copied from [place] [id number]. Topic: Appointment Scheduling - Scheduling Inquiry for Clinic >> Nov 06, 2018 10:47 AM Scherrie Gerlach wrote: Reason for CRM: pt has appt at 2:45 on 3/6, but his wife has appt at 9 am.  They would like to come around the same time. So they can make only one trip. Can you use the same day 10:30 & 10:45 slot? Please call him back thanks >> Nov 06, 2018  3:58 PM Edmonia Caprio wrote: Both husband and wife are 30 minute appts. Sent to [place] Team for approval   Pts cannot be moved together that morning or afternoon due to schedule. Pts will be called and rescheduled to another day so they can be seen back to back since wife does not drive. Hinton Dyer notified to call pts and offer to reschedule to another day or they will have to keep what they have.

## 2018-11-06 NOTE — Progress Notes (Signed)
Subjective  CC:  Chief Complaint  Patient presents with  . Back Pain    Right side, He has taking Hydrocodone and Tylenol with no relief  . Shoulder Pain    Right side, He has taking Hydrocodone and Tylenol with no relief   Same day acute visit; PCP not available. New pt to me. Chart reviewed.   HPI: Nicholas Graham is a 68 y.o. male who presents to the office today to address the problems listed above in the chief complaint.  68 year old male presents due to worsening right-sided thoracic and lumbar back pain.  He reports symptoms started several months ago after he returned to work part-time.  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  He noted acute onset of soreness in those areas, however, over the last 2 weeks pain has escalated.  He reports a 7 out of 10 pain currently.  Pain is described as aching, beneath the right shoulder blade and right low back without radicular symptoms.  He denies weakness in extremities or injury.  He is used Tylenol, NSAIDs and leftover hydrocodone without relief.  Pain interferes with sleep.  He has been able to work through the pain.  He denies history of chronic back pain.  Problem list does show history of frequent alcohol use.  Apparently, has high tolerance to narcotics.  No bowel or bladder dysfunction  Assessment  1. Acute right-sided low back pain without sciatica   2. Acute right-sided thoracic back pain      Plan   Acute back pain: Exam history consistent with musculoskeletal pain.  Suspect acute pain due to recent overuse with possible underlying arthritis.  Start muscle relaxers, NSAIDs and oxycodone for pain.  Patient to follow-up with PCP for further evaluation including x-rays and further management strategies.  Extensive counseling and education given.  Follow up: 1 to 2 weeks with PCP   No orders of the defined types were placed in this encounter.  Meds ordered this encounter  Medications  . oxyCODONE-acetaminophen (PERCOCET) 10-325  MG tablet    Sig: Take 1 tablet by mouth every 8 (eight) hours as needed for pain.    Dispense:  20 tablet    Refill:  0  . diclofenac (VOLTAREN) 75 MG EC tablet    Sig: Take 1 tablet (75 mg total) by mouth 2 (two) times daily.    Dispense:  30 tablet    Refill:  0  . cyclobenzaprine (FLEXERIL) 10 MG tablet    Sig: Take 1 tablet (10 mg total) by mouth 3 (three) times daily as needed for muscle spasms.    Dispense:  30 tablet    Refill:  0      I reviewed the patients updated PMH, FH, and SocHx.    Patient Active Problem List   Diagnosis Date Noted  . OSA on CPAP 07/04/2018  . Overweight (BMI 25.0-29.9) 05/01/2018  . Benign prostatic hyperplasia with lower urinary tract symptoms 11/15/2017  . Anxiety 09/12/2017  . Stopped smoking with greater than 30 pack year history 07/30/2017  . Pulmonary lesion 05/16/2017  . History of smoking 30 or more pack years 04/24/2016  . Alcohol ingestion of more than 4 drinks/week 09/10/2014  . Elevated hemoglobin A1c 02/19/2014  . Dyslipidemia 02/19/2014  . Essential hypertension, benign 02/19/2014  . History of colonic polyps 09/23/2009  . GERD 12/12/2007   Current Meds  Medication Sig  . amLODipine (NORVASC) 10 MG tablet Take 1 tablet (10 mg total) by mouth  daily.  . aspirin 81 MG tablet Take 81 mg by mouth daily.  Marland Kitchen escitalopram (LEXAPRO) 20 MG tablet Take 1 tablet (20 mg total) by mouth daily.  . pantoprazole (PROTONIX) 40 MG tablet Take 1 tablet (40 mg total) by mouth daily.  . simvastatin (ZOCOR) 10 MG tablet Take 1 tablet (10 mg total) by mouth every other day.    Allergies: Patient is allergic to celebrex [celecoxib]; cymbalta [duloxetine hcl]; lyrica [pregabalin]; neurontin [gabapentin]; and penicillins. Family History: Patient family history includes Bone cancer in his brother; Heart disease in his maternal uncle; Lung cancer in his father and mother. Social History:  Patient  reports that he quit smoking about 16 years ago. He  has a 90.00 pack-year smoking history. He has never used smokeless tobacco. He reports current alcohol use of about 18.0 standard drinks of alcohol per week. He reports that he does not use drugs.  Review of Systems: Constitutional: Negative for fever malaise or anorexia Cardiovascular: negative for chest pain Respiratory: negative for SOB or persistent cough Gastrointestinal: negative for abdominal pain  Objective  Vitals: BP 122/80   Pulse 87   Temp 97.7 F (36.5 C) (Oral)   Resp 16   Ht 6\' 2"  (1.88 m)   Wt 232 lb (105.2 kg)   SpO2 97%   BMI 29.79 kg/m  General: no acute distress , A&Ox3, HEENT: PEERL, conjunctiva normal, Oropharynx moist,neck is supple Cardiovascular:  RRR without murmur or gallop.  Respiratory:  Good breath sounds bilaterally, CTAB with normal respiratory effort Skin:  Warm, no rashes Back: Right subscapular tenderness present, full range of motion low back with asymmetry noted in right paravertebral muscle spasm and tenderness present, right SI joint tender, no sciatic notch tenderness, no hip tenderness, decreased range of motion to pain.  Normal gait.  Negative straight leg raise bilaterally, 2+ bilateral lower extremity symmetric reflexes present normal lower extremity strength throughout     Commons side effects, risks, benefits, and alternatives for medications and treatment plan prescribed today were discussed, and the patient expressed understanding of the given instructions. Patient is instructed to call or message via MyChart if he/she has any questions or concerns regarding our treatment plan. No barriers to understanding were identified. We discussed Red Flag symptoms and signs in detail. Patient expressed understanding regarding what to do in case of urgent or emergency type symptoms.   Medication list was reconciled, printed and provided to the patient in AVS. Patient instructions and summary information was reviewed with the patient as documented in  the AVS. This note was prepared with assistance of Dragon voice recognition software. Occasional wrong-word or sound-a-like substitutions may have occurred due to the inherent limitations of voice recognition software

## 2018-11-10 ENCOUNTER — Other Ambulatory Visit: Payer: Self-pay | Admitting: Family Medicine

## 2018-11-10 DIAGNOSIS — I10 Essential (primary) hypertension: Secondary | ICD-10-CM

## 2018-11-21 ENCOUNTER — Encounter: Payer: Self-pay | Admitting: Family Medicine

## 2018-11-21 ENCOUNTER — Ambulatory Visit: Payer: PPO | Admitting: Family Medicine

## 2018-11-21 ENCOUNTER — Ambulatory Visit (INDEPENDENT_AMBULATORY_CARE_PROVIDER_SITE_OTHER): Payer: PPO | Admitting: Family Medicine

## 2018-11-21 ENCOUNTER — Ambulatory Visit (INDEPENDENT_AMBULATORY_CARE_PROVIDER_SITE_OTHER): Payer: PPO

## 2018-11-21 VITALS — BP 129/78 | HR 88 | Temp 97.4°F | Resp 16 | Ht 74.0 in | Wt 233.8 lb

## 2018-11-21 DIAGNOSIS — M4802 Spinal stenosis, cervical region: Secondary | ICD-10-CM | POA: Diagnosis not present

## 2018-11-21 DIAGNOSIS — M545 Low back pain, unspecified: Secondary | ICD-10-CM

## 2018-11-21 DIAGNOSIS — M542 Cervicalgia: Secondary | ICD-10-CM | POA: Diagnosis not present

## 2018-11-21 DIAGNOSIS — M5412 Radiculopathy, cervical region: Secondary | ICD-10-CM | POA: Diagnosis not present

## 2018-11-21 DIAGNOSIS — M47816 Spondylosis without myelopathy or radiculopathy, lumbar region: Secondary | ICD-10-CM | POA: Diagnosis not present

## 2018-11-21 MED ORDER — CYCLOBENZAPRINE HCL 10 MG PO TABS: 10.0000 mg | ORAL_TABLET | Freq: Every day | ORAL | 1 refills | Status: DC

## 2018-11-21 MED ORDER — BACLOFEN 10 MG PO TABS: 10.0000 mg | ORAL_TABLET | Freq: Three times a day (TID) | ORAL | 2 refills | Status: DC

## 2018-11-21 NOTE — Patient Instructions (Addendum)
You will likely need a Neuro consult.  Rest, no lifting, heat.  NSAIDS,  Muscle relaxer baclofen in day-flexeril at night. Get xrays today. After results will figure out next step.    Please help [place] help you:  We are honored you have chosen [facility] for your Primary Care home. Below you will find basic instructions that you may need to access in the future. Please help [place] help you by reading the instructions, which cover many of the frequent questions we experience.   Prescription refills and request:  -In order to allow more efficient response time, please call your pharmacy for all refills. They will forward the request electronically to [place]. This allows for the quickest possible response. Request left on a nurse line can take longer to refill, since these are checked as time allows between office patients and other phone calls.  - refill request can take up to 3-5 working days to complete.  - If request is sent electronically and request is appropiate, it is usually completed in 1-2 business days.  - all patients will need to be seen routinely for all chronic medical conditions requiring prescription medications (see follow-up below). If you are overdue for follow up on your condition, you will be asked to make an appointment and we will call in enough medication to cover you until your appointment (up to 30 days).  - all controlled substances will require a face to face visit to request/refill.  - if you desire your prescriptions to go through a new pharmacy, and have an active script at original pharmacy, you will need to call your pharmacy and have scripts transferred to new pharmacy. This is completed between the pharmacy locations and not by your provider.    Results: If any images or labs were ordered, it can take up to 1 week to get results depending on the test ordered and the lab/facility running and resulting the test. - Normal or stable results, which do not need further  discussion, may be released to your mychart immediately with attached note to you. A call may not be generated for normal results. Please make certain to sign up for mychart. If you have questions on how to activate your mychart you can call the front office.  - If your results need further discussion, our office will attempt to contact you via phone, and if unable to reach you after 2 attempts, we will release your abnormal result to your mychart with instructions.  - All results will be automatically released in mychart after 1 week.  - Your provider will provide you with explanation and instruction on all relevant material in your results. Please keep in mind, results and labs may appear confusing or abnormal to the untrained eye, but it does not mean they are actually abnormal for you personally. If you have any questions about your results that are not covered, or you desire more detailed explanation than what was provided, you should make an appointment with your provider to do so.   Our office handles many outgoing and incoming calls daily. If we have not contacted you within 1 week about your results, please check your mychart to see if there is a message first and if not, then contact our office.  In helping with this matter, you help decrease call volume, and therefore allow [place] to be able to respond to patients needs more efficiently.   Acute office visits (sick visit):  An acute visit is intended for  a new problem and are scheduled in shorter time slots to allow schedule openings for patients with new problems. This is the appropriate visit to discuss a new problem. Problems will not be addressed by phone call or Echart message. Appointment is needed if requesting treatment. In order to provide you with excellent quality medical care with proper time for you to explain your problem, have an exam and receive treatment with instructions, these appointments should be limited to one new problem per  visit. If you experience a new problem, in which you desire to be addressed, please make an acute office visit, we save openings on the schedule to accommodate you. Please do not save your new problem for any other type of visit, let us take care of it properly and quickly for you.   Follow up visits:  Depending on your condition(s) your provider will need to see you routinely in order to provide you with quality care and prescribe medication(s). Most chronic conditions (Example: hypertension, Diabetes, depression/anxiety... etc), require visits a couple times a year. Your provider will instruct you on proper follow up for your personal medical conditions and history. Please make certain to make follow up appointments for your condition as instructed. Failing to do so could result in lapse in your medication treatment/refills. If you request a refill, and are overdue to be seen on a condition, we will always provide you with a 30 day script (once) to allow you time to schedule.    Medicare wellness (well visit): - we have a wonderful Nurse Maudie Mercury), that will meet with you and provide you will yearly medicare wellness visits. These visits should occur yearly (can not be scheduled less than 1 calendar year apart) and cover preventive health, immunizations, advance directives and screenings you are entitled to yearly through your medicare benefits. Do not miss out on your entitled benefits, this is when medicare will pay for these benefits to be ordered for you.  These are strongly encouraged by your provider and is the appropriate type of visit to make certain you are up to date with all preventive health benefits. If you have not had your medicare wellness exam in the last 12 months, please make certain to schedule one by calling the office and schedule your medicare wellness with Maudie Mercury as soon as possible.   Yearly physical (well visit):  - Adults are recommended to be seen yearly for physicals. Check with  your insurance and date of your last physical, most insurances require one calendar year between physicals. Physicals include all preventive health topics, screenings, medical exam and labs that are appropriate for gender/age and history. You may have fasting labs needed at this visit. This is a well visit (not a sick visit), new problems should not be covered during this visit (see acute visit).  - Pediatric patients are seen more frequently when they are younger. Your provider will advise you on well child visit timing that is appropriate for your their age. - This is not a medicare wellness visit. Medicare wellness exams do not have an exam portion to the visit. Some medicare companies allow for a physical, some do not allow a yearly physical. If your medicare allows a yearly physical you can schedule the medicare wellness with our nurse Maudie Mercury and have your physical with your provider after, on the same day. Please check with insurance for your full benefits.   Late Policy/No Shows:  - all new patients should arrive 15-30 minutes earlier than appointment to  allow [place] time  to  obtain all personal demographics,  insurance information and for you to complete office paperwork. - All established patients should arrive 10-15 minutes earlier than appointment time to update all information and be checked in .  - In our best efforts to run on time, if you are late for your appointment you will be asked to either reschedule or if able, we will work you back into the schedule. There will be a wait time to work you back in the schedule,  depending on availability.  - If you are unable to make it to your appointment as scheduled, please call 24 hours ahead of time to allow [place] to fill the time slot with someone else who needs to be seen. If you do not cancel your appointment ahead of time, you may be charged a no show fee.

## 2018-11-21 NOTE — Progress Notes (Signed)
Nicholas Graham , 11/16/1950, 68 y.o., male MRN: [id number] Patient Care Team    Relationship Specialty Notifications Start End  [name], DO PCP - General Family Medicine  10/31/15   [name], MD (Inactive) Consulting Physician Gastroenterology  04/24/16     Chief Complaint  Patient presents with  . Follow-up    back/shoulder pain     Subjective:  Nicholas Graham is a 68 y.o.   Back/shoulder pain:  Patient reports he has been suffering from midline lower lumbar pain and right neck-shoulder pain that radiates to his hand.  He was seen by another provider February 20 for this condition.  He was provided with NSAID, narcotic and muscle relaxer.  He reports his symptoms have not improved and the medications were not helpful.  He has no known history of injury recently or prior.  He has had lower lumbar spine surgery 1993 that seems consistent with a discectomy by his description.  He is allergic or has been unable to tolerate Cymbalta, gabapentin, Lyrica and Celebrex in the past when he had pain.  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  Had retired but went back part-time to give himself something to do.  His wife states when he takes time off of work, his back improves.  Depression screen [facility] 2/9 06/19/2018 12/13/2017 11/15/2017 09/12/2017 05/15/2017  Decreased Interest 0 0 0 0 0  Down, Depressed, Hopeless 0 0 0 0 0  PHQ - 2 Score 0 0 0 0 0  Altered sleeping - - 3 3 -  Tired, decreased energy - - 0 0 -  Change in appetite - - 0 0 -  Feeling bad or failure about yourself  - - 0 0 -  Trouble concentrating - - 0 0 -  Moving slowly or fidgety/restless - - 0 0 -  Suicidal thoughts - - 0 0 -  PHQ-9 Score - - 3 3 -  Difficult doing work/chores - - Not difficult at all - -    Allergies  Allergen Reactions  . Celebrex [Celecoxib]   . Cymbalta [Duloxetine Hcl]   . Lyrica [Pregabalin]   . Neurontin [Gabapentin]   . Penicillins    REACTION: hives   Social History   Tobacco Use  . Smoking status: Former Smoker    Packs/day: 3.00    Years: 30.00    Pack years: 90.00    Last attempt to quit: 07/16/2002    Years since quitting: 16.3  . Smokeless tobacco: Never Used  Substance Use Topics  . Alcohol use: Yes    Alcohol/week: 18.0 standard drinks    Types: 18 Cans of beer per week    Comment: socially   Past Medical History:  Diagnosis Date  . Arm fracture    left. Dx'd w/RSD post fracture  . Carpal tunnel syndrome   . Chest pain 2016   [facility] (Dr. Matthew Saras) doing stress echo, but feels like musculoskeletal chest wall pain is cause of his discomfort  . Deviated septum   . Diverticulosis   . GERD (gastroesophageal reflux disease)   . Hyperlipidemia   . Hypertension   . Nephrolithiasis   . OSA on CPAP 07/04/2018   - Trial of CPAP therapy on 17 cm H2O with a Large size Fisher&Paykel Full Face Mask Simplus mask and heated humidification.  . Plantar fasciitis   . Pulmonary nodule 04/2016   [place].  Pt chose  to repeat CT chest w/out contrast in 3 mo---as per radiologist's recommendations.  . Right inguinal hernia    fat-containing.  Small hydrocele in left hemiscrotum.  . Tobacco dependence in remission    Past Surgical History:  Procedure Laterality Date  . BACK SURGERY  1993  . COLONOSCOPY    . INGUINAL HERNIA REPAIR Left 2013   repeat hernia repair. x2  . KNEE ARTHROSCOPY Right   . UPPER GI ENDOSCOPY     Family History  Problem Relation Age of Onset  . Lung cancer Mother   . Lung cancer Father   . Bone cancer Brother        bone  . Heart disease Maternal Uncle   . Colon cancer Neg Hx    Allergies as of 11/21/2018      Reactions   Celebrex [celecoxib]    Cymbalta [duloxetine Hcl]    Lyrica [pregabalin]    Neurontin [gabapentin]    Penicillins    REACTION: hives      Medication List       Accurate as of November 21, 2018  8:40 AM. Always use your most recent med list.          amLODipine 10 MG tablet Commonly known as:  NORVASC Take 1 tablet (10 mg total) by mouth daily.   aspirin 81 MG tablet Take 81 mg by mouth daily.   cyclobenzaprine 10 MG tablet Commonly known as:  FLEXERIL Take 1 tablet (10 mg total) by mouth 3 (three) times daily as needed for muscle spasms.   diclofenac 75 MG EC tablet Commonly known as:  VOLTAREN Take 1 tablet (75 mg total) by mouth 2 (two) times daily.   escitalopram 20 MG tablet Commonly known as:  Lexapro Take 1 tablet (20 mg total) by mouth daily.   oxyCODONE-acetaminophen 10-325 MG tablet Commonly known as:  PERCOCET Take 1 tablet by mouth every 8 (eight) hours as needed for pain.   pantoprazole 40 MG tablet Commonly known as:  PROTONIX Take 1 tablet (40 mg total) by mouth daily.   simvastatin 10 MG tablet Commonly known as:  ZOCOR Take 1 tablet (10 mg total) by mouth every other day.       All past medical history, surgical history, allergies, family history, immunizations andmedications were updated in the EMR today and reviewed under the history and medication portions of their EMR.     ROS: Negative, with the exception of above mentioned in HPI   Objective:  BP 129/78 (BP Location: Left Arm, Patient Position: Sitting, Cuff Size: Large)   Pulse 88   Temp (!) 97.4 F (36.3 C) (Oral)   Resp 16   Ht 6\' 2"  (1.88 m)   Wt 233 lb 12.8 oz (106.1 kg)   SpO2 95%   BMI 30.02 kg/m  Body mass index is 30.02 kg/m. Gen: Afebrile. No acute distress.  Toxic in presentation.  Pleasant Caucasian male. HENT: AT. [place].  MMM. Eyes:Pupils Equal Round Reactive to light, Extraocular movements intact,  Conjunctiva without redness, discharge or icterus. MSK:    -Cervical spine/right upper extremity: No erythema, no soft tissue swelling.  Tender to palpation bony prominence of C7.  No step-offs.  Ropiness right trapezius.  No tenderness AC joint, no tenderness bicipital groove.  Negative empty can test, [place] O'Briens and  liftoff.  Discomfort with those test, but in the lumbar spine.   -Lumbar spine: No erythema, no soft tissue swelling.  Tenderness to palpation over prior discectomy scar.  Left ropiness lumbar paraspinal.  No tenderness to palpation bilateral SI joint.  Negative straight leg raise bilaterally.  However mild discomfort in his back with right straight leg raises, no radiation.  Positive FABRE  bilaterally for mid lower lumbar back pain. Skin: No rashes, purpura or petechiae.  Neuro:  Normal gait. PERLA. EOMi. Alert. Oriented x3  No exam data present No results found. No results found for this or any previous visit (from the past 24 hour(s)).  Assessment/Plan: ATWOOD ADCOCK is a 68 y.o. male present for OV for  Cervical pain/cervical radiculopathy/lumbar pain -She was encouraged to discontinue his work.  He is only working currently to give himself something to do.  Symptoms are likely secondary to overuse.  He has a history of a discectomy in his lumbar spine, he is uncomfortable at that location.  Obtain x-rays today of his cervical and lumbar spine.  Unfortunately he is allergic or intolerant to many of the medications that would be helpful for him such as gabapentin, Lyrica, Celebrex and Cymbalta. -Continue NSAIDs, prescribed baclofen to be used twice a day, not used at night and use Flexeril for nighttime muscle relaxer.  Refilled Flexeril today.  Patient reports understanding of these instructions. - DG Cervical Spine Complete; Future - DG lumbar spine Complete; Future -He will likely need advanced imaging and/or neurology referral to help manage his pain.  Hopefully once he stops working again, his symptoms will not be exacerbated. -Follow-up dependent upon x-ray results.   Reviewed expectations re: course of current medical issues.  Discussed self-management of symptoms.  Outlined signs and symptoms indicating need for more acute intervention.  Patient verbalized understanding and  all questions were answered.  Patient received an After-Visit Summary.    No orders of the defined types were placed in this encounter.    Note is dictated utilizing voice recognition software. Although note has been proof read prior to signing, occasional typographical errors still can be missed. If any questions arise, please do not hesitate to call for verification.   electronically signed by:  Howard Pouch, DO  [place]

## 2018-11-26 ENCOUNTER — Other Ambulatory Visit: Payer: Self-pay

## 2018-11-26 ENCOUNTER — Encounter: Payer: Self-pay | Admitting: Pulmonary Disease

## 2018-11-26 ENCOUNTER — Ambulatory Visit (INDEPENDENT_AMBULATORY_CARE_PROVIDER_SITE_OTHER): Payer: PPO | Admitting: Pulmonary Disease

## 2018-11-26 ENCOUNTER — Ambulatory Visit: Payer: PPO | Admitting: Pulmonary Disease

## 2018-11-26 DIAGNOSIS — G4733 Obstructive sleep apnea (adult) (pediatric): Secondary | ICD-10-CM | POA: Diagnosis not present

## 2018-11-26 DIAGNOSIS — Z9989 Dependence on other enabling machines and devices: Secondary | ICD-10-CM

## 2018-11-26 NOTE — Progress Notes (Signed)
   Subjective:    Patient ID: Nicholas Graham, male    DOB: 1951/05/28, 68 y.o.   MRN: [id number]  HPI  68 year old for follow-up of severe OSA.  Initially started on Auto CPAP 10 to 18 cm with large full facemask.- He settled on Fisher-Paykel full facemask  Due to increased pressure, this was dropped to auto settings 10 to 16 cm. Still complains of increased pressure.  Sleeping much better and feels rested and has more energy however wakes up in the middle of the night at least twice every night with increased pressure and feels like his mouth is going to blow off. Also complains of dryness  Download reviewed which shows excellent compliance and average pressure 15 cm with residual AHI/hour and mild leak   Significant tests/ events reviewed  HST 05/2018 severe OSA, AHI 57/hour, lowest desaturation 67%  CPAP 06/2018 17 cm  CT chest without contrast 04/2016 showed calcified granulomas and calcified mediastinal lymphadenopathy with a 7 x 8 mm nodule in the left mid lung which had a speck of calcification eccentric.  Review of Systems neg for any significant sore throat, dysphagia, itching, sneezing, nasal congestion or excess/ purulent secretions, fever, chills, sweats, unintended wt loss, pleuritic or exertional cp, hempoptysis, orthopnea pnd or change in chronic leg swelling. Also denies presyncope, palpitations, heartburn, abdominal pain, nausea, vomiting, diarrhea or change in bowel or urinary habits, dysuria,hematuria, rash, arthralgias, visual complaints, headache, numbness weakness or ataxia.     Objective:   Physical Exam   Gen. Pleasant, obese, in no distress ENT - no lesions, no post nasal drip, large beard Neck: No JVD, no thyromegaly, no carotid bruits Lungs: no use of accessory muscles, no dullness to percussion, decreased without rales or rhonchi  Cardiovascular: Rhythm regular, heart sounds  normal, no murmurs or gallops, no peripheral edema Musculoskeletal: No  deformities, no cyanosis or clubbing , no tremors        Assessment & Plan:

## 2018-11-26 NOTE — Patient Instructions (Signed)
We will decrease auto settings to 10 to 15 cm Call me back in 1-2  week to report  If needed we can drop pressure further to 14 cm

## 2018-11-26 NOTE — Addendum Note (Signed)
Addended by: Della Goo C on: 11/26/2018 10:11 AM   Modules accepted: Orders

## 2018-11-26 NOTE — Assessment & Plan Note (Signed)
We will decrease auto settings to 10 to 15 cm Call me back in 1-2  week to report  If needed we can drop pressure further to 14 cm May have to tried pressure for comfort/compliance here. Discussed beard but he wants to hold onto this  Weight loss encouraged, compliance with goal of at least 4-6 hrs every night is the expectation. Advised against medications with sedative side effects Cautioned against driving when sleepy - understanding that sleepiness will vary on a day to day basis

## 2018-11-27 DIAGNOSIS — G4733 Obstructive sleep apnea (adult) (pediatric): Secondary | ICD-10-CM | POA: Diagnosis not present

## 2018-11-28 ENCOUNTER — Telehealth: Payer: Self-pay | Admitting: Pulmonary Disease

## 2018-11-28 NOTE — Telephone Encounter (Signed)
Patient advised that everything is going well with cpap pressure change. Will route to [facility] as a fyi.

## 2018-12-01 ENCOUNTER — Telehealth: Payer: Self-pay | Admitting: Family Medicine

## 2018-12-01 NOTE — Telephone Encounter (Signed)
I called and spoke with pt and he states his back is not worse but is the same. Pt was given results again and MD recommendations. Pt would like a message sent to Dr Raoul Pitch asking if there are any other medications that could be RX? He has stopped working, taking all medications with no relief. I let pt know that he does have several allergies to medications and he stated he could "try them again if it would help". I told pt if it was getting worse he needed to come and be seen, if not continue with regiment and schedule appt in a week or two to be evaluated again and discuss new plan. Pt still would like to know if there is another option in the meantime for medications or relief.   Please advise

## 2018-12-01 NOTE — Telephone Encounter (Signed)
Copied from [place] [id number]. Topic: Quick Communication - See Telephone Encounter >> Dec 01, 2018  9:00 AM Reyne Dumas L wrote: CRM for notification. See Telephone encounter for: 12/01/18.  Pt calling because he had x-rays done but doesn't understand his results.  Pt states he is still having back pain and would like to know if this means it is now time for a MRI.

## 2018-12-02 MED ORDER — NAPROXEN 500 MG PO TABS: 500.0000 mg | ORAL_TABLET | Freq: Two times a day (BID) | ORAL | 0 refills | Status: DC

## 2018-12-02 NOTE — Telephone Encounter (Signed)
Pt was called and given information about medication. Pt verbalized understanding

## 2018-12-02 NOTE — Telephone Encounter (Signed)
Please inform patient the following information: He was prescribed diclofenac, oxy and a muscle relaxer and reported it was not working for him.  We added another muscle relaxer so he could take it in the day.  He is allergic to celebrex, cymbalta, lyrica and gabapentin--> gabapentin would have been the next option.   There just is not anything else- if he would like he can DC the Voltaren and we can try naproxen (with food). I have called this in for him--> he must understand not to take naproxen and Voltaren (one or the other)   I would also recommend he try OTC lidocaine patches.

## 2018-12-08 ENCOUNTER — Other Ambulatory Visit: Payer: Self-pay | Admitting: Family Medicine

## 2018-12-08 DIAGNOSIS — I10 Essential (primary) hypertension: Secondary | ICD-10-CM

## 2018-12-10 ENCOUNTER — Telehealth: Payer: Self-pay | Admitting: Family Medicine

## 2018-12-10 MED ORDER — NAPROXEN 500 MG PO TABS: 500.0000 mg | ORAL_TABLET | Freq: Two times a day (BID) | ORAL | 2 refills | Status: DC

## 2018-12-10 NOTE — Telephone Encounter (Signed)
I have refilled his naproxen. Keep the 4/2 appt  for his chronic conditions for now. Hopefully by then we will be somewhat back to normal as far as the office/COVID19 precautions. As far as the MRI, right now the system is not scheduling any non-emergent imaging during [place] until after the first week in April. Even I do put in the order for him. I understand his frustration. Hopefully on 4/2 appt we can ordered and by the time it is approved by insurance, imaging centers will be back to regular schedule also.  If pain becomes unbearable, I would advise him to go to ED for evaluation.   Please correct appt time slot- he is a 30 min pt, it is in a 15 min slot. I also would like the 15 min slot after his 30 min blocked- so we can address all these issues since it is not his fault COVID19 precautions needed to be taken.

## 2018-12-10 NOTE — Telephone Encounter (Signed)
Pt was called and given all information and verbalized understanding. He states the pain is now in between his shoulder blades but heat does help. The medication he states helps but he can tell when it wears off. He is okay with plan to wait until 4/2 and verbalized understanding he is to go to ED if pain worsens

## 2018-12-10 NOTE — Telephone Encounter (Signed)
Patient is inquiring about his 12/18/18 OV. Patient would like to discuss his back pain. He states he is taking naprosyn twice a day so he will be running out prior to his visit. He also inquired about his MRI. Please call patient back.

## 2018-12-10 NOTE — Telephone Encounter (Signed)
Would you like for me to offer him a phone visit for next week or the end of this week to discuss back pain? Pt is still having back pain, not getting any better, and is requesting MRI. LOV 11/21/2018 for back pain, Naproxen called in 12/01/2018  Please advise.

## 2018-12-17 DIAGNOSIS — G4733 Obstructive sleep apnea (adult) (pediatric): Secondary | ICD-10-CM | POA: Diagnosis not present

## 2018-12-18 ENCOUNTER — Encounter: Payer: Self-pay | Admitting: Family Medicine

## 2018-12-18 ENCOUNTER — Other Ambulatory Visit: Payer: Self-pay

## 2018-12-18 ENCOUNTER — Ambulatory Visit (INDEPENDENT_AMBULATORY_CARE_PROVIDER_SITE_OTHER): Payer: PPO | Admitting: Family Medicine

## 2018-12-18 VITALS — BP 140/84

## 2018-12-18 DIAGNOSIS — F419 Anxiety disorder, unspecified: Secondary | ICD-10-CM

## 2018-12-18 DIAGNOSIS — E785 Hyperlipidemia, unspecified: Secondary | ICD-10-CM

## 2018-12-18 DIAGNOSIS — I10 Essential (primary) hypertension: Secondary | ICD-10-CM

## 2018-12-18 DIAGNOSIS — K2100 Gastro-esophageal reflux disease with esophagitis, without bleeding: Secondary | ICD-10-CM

## 2018-12-18 MED ORDER — GABAPENTIN 600 MG PO TABS: ORAL_TABLET | ORAL | 0 refills | Status: DC

## 2018-12-18 MED ORDER — HYDROCHLOROTHIAZIDE 25 MG PO TABS: 25.0000 mg | ORAL_TABLET | Freq: Every day | ORAL | 1 refills | Status: DC

## 2018-12-18 MED ORDER — ESCITALOPRAM OXALATE 20 MG PO TABS: 20.0000 mg | ORAL_TABLET | Freq: Every day | ORAL | 1 refills | Status: DC

## 2018-12-18 MED ORDER — AMLODIPINE BESYLATE 10 MG PO TABS: 10.0000 mg | ORAL_TABLET | Freq: Every day | ORAL | 1 refills | Status: DC

## 2018-12-18 NOTE — Progress Notes (Signed)
Interactive audio and video telecommunications were attempted between this provider and patient, however failed, due to patient having technical difficulties OR patient did not have access to video capability. We continued and completed visit with audio only.    Virtual Visit via Telephone Note  I connected with Nicholas Graham on 12/18/18 at  8:00 AM EDT by telephone and verified that I am speaking with the correct person using two identifiers.   I discussed the limitations, risks, security and privacy concerns of performing an evaluation and management service by telephone and the availability of in person appointments. I also discussed with the patient that there may be a patient responsible charge related to this service. The patient expressed understanding and agreed to proceed.  Chief Complaint  Patient presents with  . Hypertension    No concerns. Pt is taking medications as directed. Pt took BP meds at 6:30am. BP reading at 8:04am 161/89.   . Back Pain    Back pain is no better and would like MRI.      History of Present Illness: Hypertension/HLD/overweight: Pt reports  compliance with amlodipine 10 mg daily. Blood pressures ranges at home not routinely checked. Patient denies chest pain, shortness of breath, dizziness or lower extremity edema.   Pt takes a daily baby ASA. Pt is  prescribed statin. HCTZ removed from regimen 2/2 BPH sx--> however he restarted this on his own and is tolerating. BMP: 06/19/2018 within normal limits CBC: 06/19/2018 normal limits Lipid: 06/19/2018 total cholesterol 167, HDL 38, LDL 103, triglycerides 132 TSH: 09/12/2017 2.31 Diet: Low-sodium Exercise: Not exercising as routinely. RF: Hypertension, hyperlipidemia, family history of heart disease. Overweight.  Back/shoulder pain:  Patient has had similar discomfort in the past.  Most of his discomfort now is located "near neck between shoulder blades ".  He reports the symptoms are still exacerbated by  use of his right arm.  He states he will start to use his right arm and then his arm will feel "heavy "and he will get pain or discomfort on the location between his shoulder blades/neck.  He is taking the naproxen twice daily with meals, he misunderstood the instructions and did not start baclofen, he is taking Flexeril before bed.  Lidocaine patches and sprays which are helpful.  He states the naproxen and the sprays help but only for a few hours.  Denies any bladder or bowel changes.  He denies any weakness or tingling in his extremities. Prior note: Patient reports he has been suffering from midline lower lumbar pain and right neck-shoulder pain that radiates to his hand.  He was seen by another provider February 20 for this condition, condition started February.  He was provided with NSAID, narcotic and muscle relaxer.  He reports his symptoms have not improved and the medications were not helpful.  He has no known history of injury recently or prior.  He has had lower lumbar spine surgery 1993 that seems consistent with a discectomy by his description.  He is allergic or has been unable to tolerate Cymbalta, gabapentin, Lyrica and Celebrex in the past when he had pain.  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  Had retired but went back part-time to give himself something to do.  His wife states when he takes time off of work, his back improves.  Observations/Objective: BP 140/84  Gen: No acute distress.  Chest: No coughing or shortness of breath. Neuro:  Alert. Oriented x3  Psych: Normal affect and demeanor. Normal speech. Normal thought content and judgment.    Assessment and Plan: Essential hypertension, benign/dyslipidemia/overweight -Borderline today.  Uncertain if this is his home blood pressure cuff or possibly the naproxen or pain tracking up his blood pressure.  He did report very mild edema but had restarted his HCTZ.  - Refills provided today on  his amlodipine 10 mg daily and HCTZ. - Continue Zocor 10 mg daily.   - continue fish oil. - continue ASA 81 mg - routine diet and exercise.  - F/U 6 months with provider on hypertension.  He will come by for blood pressure recheck outdoors by nurse tomorrow.  Gastroesophageal reflux disease with esophagitis Stable.  Continue Protonix.  Back pain: -Not improved but stable. -Continue to refrain from labor, he is able to do this he was retired and just returned to work because he enjoys it.  - He has a history of a discectomy in his lumbar spine.  Has degenerative changes, bone spurring and facet arthropathy in his lumbar spine by x-rays obtained last visit.  - Cervical l spine x-ray with mild bilateral foraminal narrowing at C3-C4 - Unfortunately he is allergic or intolerant to many of the medications that would be helpful for him such as gabapentin, Lyrica, Celebrex and Cymbalta --> we discussed this in more detail today and he believes that the "allergy-hallucinations "secondary to having multiple of these medicines on board at the same time back in 2014.  He wants to try the gabapentin and I believe that is reasonable.  I provided him with tapering instructions verbally and descriptive instructions on his label.  Understands that if he does have any side effects he is to call immediately, and of course do not continue the medication.   -Continue naproxen for now, hopefully we will be able to remove this if he responds well to gabapentin.  -Discontinue Flexeril, at least well tapering on gabapentin -Continue baclofen to be used 3 times daily, not used at night and use Flexeril for nighttime muscle relaxer.  Refilled Flexeril today.  Patient reports understanding of these instructions. - DG Cervical Spine Complete; Future - DG lumbar spine Complete; Future -He will likely need advanced imaging and/or neurology referral to help manage his pain--> these were offered to him today, and he elected to  wait and try the gabapentin.  He hopes that the gabapentin will work and he will not need any further management and he would like to not start a work-up now during COVID-19 outbreak if at all possible.   Follow Up Instructions: 6 months HTN, tomorrow BP check, 4 weeks on back    I discussed the assessment and treatment plan with the patient. The patient was provided an opportunity to ask questions and all were answered. The patient agreed with the plan and demonstrated an understanding of the instructions.   The patient was advised to call back or seek an in-person evaluation if the symptoms worsen or if the condition fails to improve as anticipated.  I provided 45 minutes of non-face-to-face time during this encounter.   Howard Pouch, DO

## 2018-12-18 NOTE — Patient Instructions (Addendum)
Start gabapentin taper 1/2 tab before bed for 3 days, then increase to 1/2 tab every 12 hours for 1 week. If tolerating can increase to 1/2 tab every 8 hours. Watch closely for side effects, stop if having.  Stop flexeril for now.  Continue baclofen TID Continue naproxen for now. Hopefully will be able to come off soon.  Refilled BP meds and schedule for tent/outdoor BP check.   F/U on back in 4 weeks.

## 2018-12-19 ENCOUNTER — Ambulatory Visit (INDEPENDENT_AMBULATORY_CARE_PROVIDER_SITE_OTHER): Payer: PPO

## 2018-12-19 VITALS — BP 142/84

## 2018-12-19 DIAGNOSIS — I10 Essential (primary) hypertension: Secondary | ICD-10-CM

## 2018-12-19 NOTE — Progress Notes (Addendum)
Nicholas Graham is a 68 y.o. male presents to the office today for Blood pressure recheck secondary to Hypertesnsion. Blood pressure medication:Amlodipine 10mg  tab 1 po qd, HCTZ 25mg  tab 1 po qd. If on medication, Last dose was at least 1-2 hours prior to recheck: Yes Blood pressure was taken in the Left arm after patient rested for 10-15 minutes.  BP (!) 142/84 (BP Location: Left Arm, Patient Position: Sitting, Cuff Size: Normal)   Brittanae D Staton   __________________________________________________________________  Please inform pt I have reviewed his BP reading.  Continue amlodipine and his HCTZ--> also start lisinopril 10 mg QD- which was called into his pharmacy.  Monitor BP over the next few weeks on new medication to ensure BP routinely < 135/80-- call back in 4 weeks only if BP higher than desired.  Electronically Signed by: Howard Pouch, DO [facility]

## 2019-01-05 DIAGNOSIS — G4733 Obstructive sleep apnea (adult) (pediatric): Secondary | ICD-10-CM | POA: Diagnosis not present

## 2019-01-05 MED ORDER — LISINOPRIL 10 MG PO TABS: 10.0000 mg | ORAL_TABLET | Freq: Every day | ORAL | 1 refills | Status: DC

## 2019-01-05 NOTE — Addendum Note (Signed)
Addended by: Howard Pouch A on: 01/05/2019 11:14 AM   Modules accepted: Orders, Level of Service

## 2019-01-05 NOTE — Addendum Note (Signed)
Addended by: Howard Pouch A on: 01/05/2019 11:28 AM   Modules accepted: Level of Service

## 2019-01-15 ENCOUNTER — Ambulatory Visit (INDEPENDENT_AMBULATORY_CARE_PROVIDER_SITE_OTHER): Payer: PPO | Admitting: Family Medicine

## 2019-01-15 ENCOUNTER — Other Ambulatory Visit: Payer: Self-pay

## 2019-01-15 ENCOUNTER — Encounter: Payer: Self-pay | Admitting: Family Medicine

## 2019-01-15 VITALS — BP 126/73 | Ht 74.0 in | Wt 237.0 lb

## 2019-01-15 DIAGNOSIS — M546 Pain in thoracic spine: Secondary | ICD-10-CM | POA: Diagnosis not present

## 2019-01-15 DIAGNOSIS — M5134 Other intervertebral disc degeneration, thoracic region: Secondary | ICD-10-CM

## 2019-01-15 DIAGNOSIS — M5136 Other intervertebral disc degeneration, lumbar region: Secondary | ICD-10-CM | POA: Diagnosis not present

## 2019-01-15 DIAGNOSIS — M5090 Cervical disc disorder, unspecified, unspecified cervical region: Secondary | ICD-10-CM

## 2019-01-15 DIAGNOSIS — Z9889 Other specified postprocedural states: Secondary | ICD-10-CM

## 2019-01-15 DIAGNOSIS — G8929 Other chronic pain: Secondary | ICD-10-CM | POA: Diagnosis not present

## 2019-01-15 MED ORDER — GABAPENTIN 600 MG PO TABS: 600.0000 mg | ORAL_TABLET | Freq: Three times a day (TID) | ORAL | 1 refills | Status: DC

## 2019-01-15 MED ORDER — OXYCODONE-ACETAMINOPHEN 10-325 MG PO TABS: 1.0000 | ORAL_TABLET | Freq: Three times a day (TID) | ORAL | 0 refills | Status: DC | PRN

## 2019-01-15 MED ORDER — BACLOFEN 10 MG PO TABS: 10.0000 mg | ORAL_TABLET | Freq: Three times a day (TID) | ORAL | 2 refills | Status: DC

## 2019-01-15 NOTE — Progress Notes (Signed)
I have discussed the procedure for the virtual visit with the patient who has given consent to proceed with assessment and treatment.   BETHANY DILLARD, CMA     

## 2019-01-15 NOTE — Progress Notes (Signed)
VIRTUAL VISIT VIA VIDEO-fail  I connected with Nicholas Graham on 01/15/19 at  8:40 AM EDT by a video enabled telemedicine application and verified that I am speaking with the correct person using two identifiers. Location patient: Home Location provider: [facility], Office Persons participating in the virtual visit: Patient, Dr. Raoul Pitch and R.Baker, LPN  I discussed the limitations of evaluation and management by telemedicine and the availability of in person appointments. The patient expressed understanding and agreed to proceed.  Interactive audio and video telecommunications were attempted between this provider and patient, however failed, due to patient having technical difficulties OR patient did not have access to video capability. We continued and completed visit with audio only.  Patient Care Team    Relationship Specialty Notifications Start End  [name], DO PCP - General Family Medicine  10/31/15   [name], MD (Inactive) Consulting Physician Gastroenterology  04/24/16     SUBJECTIVE Chief Complaint  Patient presents with   Back Pain    Patient states that pain is the same as his last visit.  Medications are not woring    HPI:  Cervical neck pain with evidence of disc disease/Chronic midline thoracic back pain/Thoracic degenerative disc disease/DDD (degenerative disc disease), lumbar/History of lumbar discectomy Lower cervical to mid thoracic pain still present. Using his right arm exacerbates his pain midline of thoracic.  He has a history thoracic spine degeneration and discectomy in his lumbar spine.  Has degenerative changes, bone spurring and facet arthropathy in his lumbar spine by x-rays obtained 11/2018.Cervical spine x-ray with mild bilateral foraminal narrowing at C3-C4 11/2018. Unfortunately he was reported as allergic or intolerant to many of the medications that would be helpful for him such as gabapentin, Lyrica, Celebrex and Cymbalta --> we  discussed this in more detail last visit and he believes that the "allergy-hallucinations " was secondary to having multiple of these medicines on board at the same time back in 2014. We tried to start gabapentin and it did not have any side effects from medication. It has not been extremely helpful  Yet at lower doses. He admits to rather sigmnificant stomach upset starting from the naproxen BID use ober the last 8 week. Advanced imaging has not bee able to be completed 2/2 to covid outbreak. He desired to wait on referral to specialist last visit in hope that he would receive benefit from the gabapentin.    Prior note:  Patient has had similar discomfort in the past.  Most of his discomfort now is located "near neck between shoulder blades ".  He reports the symptoms are still exacerbated by use of his right arm.  He states he will start to use his right arm and then his arm will feel "heavy "and he will get pain or discomfort on the location between his shoulder blades/neck.  He is taking the naproxen twice daily with meals, he misunderstood the instructions and did not start baclofen, he is taking Flexeril before bed.  Lidocaine patches and sprays which are helpful.  He states the naproxen and the sprays help but only for a few hours.  Denies any bladder or bowel changes.  He denies any weakness or tingling in his extremities. Prior note: Patient reports he has been suffering from midline lower lumbar pain and right neck-shoulder pain that radiates to his hand. He was seen by another provider February 20 for this condition, condition started February. He was provided with NSAID, narcotic and muscle relaxer.  He reports his symptoms have not improved and the medications were not helpful. He has no known history of injury recently or prior. He has had lower lumbar spine surgery 1993 that seems consistent with a discectomy by his description. He is allergic or has been unable to tolerate Cymbalta,  gabapentin, Lyrica and Celebrex in the past when he had pain. 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 His wife states when he takes time off of work, his back improves. ROS: See pertinent positives and negatives per HPI.  Patient Active Problem List   Diagnosis Date Noted   OSA on CPAP 07/04/2018   Overweight (BMI 25.0-29.9) 05/01/2018   Benign prostatic hyperplasia with lower urinary tract symptoms 11/15/2017   Anxiety 09/12/2017   Stopped smoking with greater than 30 pack year history 07/30/2017   Pulmonary lesion 05/16/2017   History of smoking 30 or more pack years 04/24/2016   Alcohol ingestion of more than 4 drinks/week 09/10/2014   Elevated hemoglobin A1c 02/19/2014   Dyslipidemia 02/19/2014   Essential hypertension, benign 02/19/2014   History of colonic polyps 09/23/2009   GERD 12/12/2007    Social History   Tobacco Use   Smoking status: Former Smoker    Packs/day: 3.00    Years: 30.00    Pack years: 90.00    Last attempt to quit: 07/16/2002    Years since quitting: 16.5   Smokeless tobacco: Never Used  Substance Use Topics   Alcohol use: Yes    Alcohol/week: 18.0 standard drinks    Types: 18 Cans of beer per week    Comment: socially    Current Outpatient Medications:    amLODipine (NORVASC) 10 MG tablet, Take 1 tablet (10 mg total) by mouth daily., Disp: 90 tablet, Rfl: 1   aspirin 81 MG tablet, Take 81 mg by mouth daily., Disp: , Rfl:    baclofen (LIORESAL) 10 MG tablet, Take 1 tablet (10 mg total) by mouth 3 (three) times daily., Disp: 90 tablet, Rfl: 2   escitalopram (LEXAPRO) 20 MG tablet, Take 1 tablet (20 mg total) by mouth daily., Disp: 90 tablet, Rfl: 1   gabapentin (NEURONTIN) 600 MG tablet, 1/2 tab before bed for 3 days, then increase to 1/2 tab every 12 hours for 1 week. If tolerating can increase to 1/2 tab every 8 hours.,  Disp: 45 tablet, Rfl: 0   hydrochlorothiazide (HYDRODIURIL) 25 MG tablet, Take 1 tablet (25 mg total) by mouth daily., Disp: 90 tablet, Rfl: 1   lisinopril (ZESTRIL) 10 MG tablet, Take 1 tablet (10 mg total) by mouth daily., Disp: 90 tablet, Rfl: 1   naproxen (NAPROSYN) 500 MG tablet, Take 1 tablet (500 mg total) by mouth 2 (two) times daily with a meal., Disp: 60 tablet, Rfl: 2   pantoprazole (PROTONIX) 40 MG tablet, Take 1 tablet (40 mg total) by mouth daily., Disp: 90 tablet, Rfl: 3   simvastatin (ZOCOR) 10 MG tablet, Take 1 tablet (10 mg total) by mouth every other day., Disp: 15 tablet, Rfl: 11   cyclobenzaprine (FLEXERIL) 10 MG tablet, Take 1 tablet (10 mg total) by mouth at bedtime. (Patient not taking: Reported on 01/15/2019), Disp: 30 tablet, Rfl: 1  Allergies  Allergen Reactions   Celebrex [Celecoxib]    Cymbalta [Duloxetine Hcl]    Lyrica [Pregabalin]    Neurontin [Gabapentin]    Penicillins  REACTION: hives    OBJECTIVE: BP 126/73    Ht 6\' 2"  (1.88 m)    Wt 237 lb (107.5 kg)    BMI 30.43 kg/m  Gen: No acute distress. Nontoxic Chest: Cough or shortness of breath not present Neuro: Alert. Oriented x3  Psych: Normal affect, dress and demeanor. Normal speech. Normal thought content and judgment.  ASSESSMENT AND PLAN: Nicholas Graham is a 68 y.o. male present for  Cervical neck pain with evidence of disc disease/Chronic midline thoracic back pain/Thoracic degenerative disc disease/DDD (degenerative disc disease), lumbar/History of lumbar discectomy - persistent pain- mostly now focused mid thoracic area- worse with use of right arm. Other locations have discomfort have improved with medication treatment.   - Lumbar: He has a history of a discectomy in his lumbar spine.  Has degenerative changes, bone spurring and facet arthropathy in his lumbar spine by x-rays 11/2018 - Thoracic: CT chest 2017- evidence of thoracic degeneration - Cervical :Cervical spine x-ray with  mild bilateral foraminal narrowing at C3-C4 by xrays 11/2018 - Due to [place] pandemic thoracic xray was not able to be completed, nor advanced image.  - Unfortunately he is allergic or intolerant, to many of the medications that would be helpful for him such as  Lyrica, Celebrex and Cymbalta --> we discussed this in more detail today and he believes that the "allergy-hallucinations "secondary to having multiple of these medicines on board at the same time back in 2014.   - Pain: DC naproxen--> causing stomach upset. Dicolfenac not helpful. Although percocet did not resolve pain, he is willing to retry temporarily until can be seen by specialist. Percocet 10-325 mg TID PRN prescribed #90.  - He has been tolerating gabapentin, has not found it helpful much--> increase dose today. Gabapentin taper up to 600 mg TID instructed and pt reports understanding.  -Continue baclofen TID - He is agreeable today to referral to ortho/neuro. Referral placed today.  - 4 week f/u - if he is able to be seen by specialist prior to appt he may cancel our follow up and they will take over management.   Orders placed today: - gabapentin (NEURONTIN) 600 MG tablet; Take 1 tablet (600 mg total) by mouth 3 (three) times daily.  Dispense: 270 tablet; Refill: 1 - Ambulatory referral to Orthopedic Surgery - oxyCODONE-acetaminophen (PERCOCET) 10-325 MG tablet; Take 1 tablet by mouth every 8 (eight) hours as needed for up to 30 days for pain.  Dispense: 90 tablet; Refill: 0 - baclofen (LIORESAL) 10 MG tablet; Take 1 tablet (10 mg total) by mouth 3 (three) times daily.  Dispense: 90 tablet; Refill: 2   > 25 minutes spent directly with patient  Howard Pouch, DO 01/15/2019

## 2019-01-16 DIAGNOSIS — G4733 Obstructive sleep apnea (adult) (pediatric): Secondary | ICD-10-CM | POA: Diagnosis not present

## 2019-01-23 DIAGNOSIS — M545 Low back pain: Secondary | ICD-10-CM | POA: Diagnosis not present

## 2019-01-23 DIAGNOSIS — M542 Cervicalgia: Secondary | ICD-10-CM | POA: Diagnosis not present

## 2019-01-26 DIAGNOSIS — M542 Cervicalgia: Secondary | ICD-10-CM | POA: Diagnosis not present

## 2019-01-29 DIAGNOSIS — M542 Cervicalgia: Secondary | ICD-10-CM | POA: Diagnosis not present

## 2019-01-29 DIAGNOSIS — M545 Low back pain: Secondary | ICD-10-CM | POA: Diagnosis not present

## 2019-02-02 ENCOUNTER — Telehealth: Payer: Self-pay

## 2019-02-02 NOTE — Telephone Encounter (Signed)
Narcotics, such as percocet,  are  highly addictive medications. Narcotics long term is a last line option for treatment.  I would not prescribe chronic narcotics unless treatment through neurology failed- and then he would need pain management specialist. I encourage him to call them back and schedule appt.

## 2019-02-02 NOTE — Telephone Encounter (Signed)
Copied from [place] [id number]. Topic: General - Inquiry >> Feb 02, 2019  9:23 AM Virl Axe D wrote: Reason for CRM: Pt stated he had some MRI and testing done and needs to get Dr. Lucita Lora opinion regarding some suggestions from his other providers. Please advise. FE#[phone]

## 2019-02-02 NOTE — Telephone Encounter (Signed)
Called pt and he stated he had MRI completed and he was referred to neuro to get shots in his back/pain management. Pt was called and said he did not want to go to neuro, did not want shots, and did not want to go to pain management. He feels with the medication you are RXing him and if he slows down with physical work then he will manage it for now. He wants to know if you will continue to RX the oxycodone and Gabapentin so he does not have to go to pain management or neurologist/agree to plan.   Please advise.

## 2019-02-03 NOTE — Telephone Encounter (Signed)
Called and spoke with patient and gave him information. He verbalized understanding

## 2019-02-13 ENCOUNTER — Ambulatory Visit: Payer: PPO | Admitting: Family Medicine

## 2019-02-16 DIAGNOSIS — G4733 Obstructive sleep apnea (adult) (pediatric): Secondary | ICD-10-CM | POA: Diagnosis not present

## 2019-03-10 ENCOUNTER — Telehealth: Payer: Self-pay | Admitting: Pulmonary Disease

## 2019-03-10 DIAGNOSIS — G4733 Obstructive sleep apnea (adult) (pediatric): Secondary | ICD-10-CM

## 2019-03-10 DIAGNOSIS — Z9989 Dependence on other enabling machines and devices: Secondary | ICD-10-CM

## 2019-03-10 NOTE — Telephone Encounter (Signed)
Nicholas Graham wife is returning phone call.  Nicholas Graham phone number is [phone].

## 2019-03-10 NOTE — Telephone Encounter (Signed)
Spoke with patient's wife. She stated that his current mask is not working well for him. He has having a large amount of air leaking from the mask. The air leaks so bad that he unable to sleep with the current mask. Patient wants to see if he could have an order sent to [facility] for nasal pillows.   Advised wife that I would print out a download from his machine and have one of our providers to review it to see if any other changes would be recommended. She verbalized understanding.   TP, please advise on the download and if you are ok with [place] ordering nasal pillows for him. Thanks!

## 2019-03-10 NOTE — Telephone Encounter (Signed)
That is fine  Can send , let us know if this does not work  May need referral for mask fitting  Please get CPAP download in 6 weeks   Please contact office for sooner follow up if symptoms do not improve or worsen or seek emergency care

## 2019-03-10 NOTE — Telephone Encounter (Signed)
Spoke with both patient and his wife. They are aware that TP has approved for him to have the RX for the nasal pillows. Advised them that I would go ahead and send the order to [facility]. Verbalized understanding.   Nothing further needed at time of call.

## 2019-03-10 NOTE — Telephone Encounter (Signed)
Left message for patient's wife to call back.  

## 2019-03-11 ENCOUNTER — Telehealth: Payer: Self-pay | Admitting: Neurology

## 2019-03-11 ENCOUNTER — Other Ambulatory Visit: Payer: Self-pay | Admitting: Family Medicine

## 2019-03-11 ENCOUNTER — Other Ambulatory Visit: Payer: Self-pay

## 2019-03-11 ENCOUNTER — Encounter: Payer: Self-pay | Admitting: Neurology

## 2019-03-11 ENCOUNTER — Ambulatory Visit (INDEPENDENT_AMBULATORY_CARE_PROVIDER_SITE_OTHER): Payer: PPO | Admitting: Neurology

## 2019-03-11 VITALS — BP 125/63 | HR 83 | Temp 98.0°F | Ht 74.0 in | Wt 239.0 lb

## 2019-03-11 DIAGNOSIS — R200 Anesthesia of skin: Secondary | ICD-10-CM | POA: Diagnosis not present

## 2019-03-11 DIAGNOSIS — R29898 Other symptoms and signs involving the musculoskeletal system: Secondary | ICD-10-CM | POA: Diagnosis not present

## 2019-03-11 DIAGNOSIS — I10 Essential (primary) hypertension: Secondary | ICD-10-CM

## 2019-03-11 DIAGNOSIS — M79604 Pain in right leg: Secondary | ICD-10-CM

## 2019-03-11 DIAGNOSIS — G8929 Other chronic pain: Secondary | ICD-10-CM | POA: Diagnosis not present

## 2019-03-11 DIAGNOSIS — M5410 Radiculopathy, site unspecified: Secondary | ICD-10-CM

## 2019-03-11 DIAGNOSIS — R202 Paresthesia of skin: Secondary | ICD-10-CM

## 2019-03-11 DIAGNOSIS — G5601 Carpal tunnel syndrome, right upper limb: Secondary | ICD-10-CM | POA: Diagnosis not present

## 2019-03-11 DIAGNOSIS — M5441 Lumbago with sciatica, right side: Secondary | ICD-10-CM

## 2019-03-11 DIAGNOSIS — M542 Cervicalgia: Secondary | ICD-10-CM

## 2019-03-11 DIAGNOSIS — M25511 Pain in right shoulder: Secondary | ICD-10-CM

## 2019-03-11 MED ORDER — ALPRAZOLAM 0.25 MG PO TABS: ORAL_TABLET | ORAL | 0 refills | Status: DC

## 2019-03-11 MED ORDER — MELOXICAM 15 MG PO TABS: 15.0000 mg | ORAL_TABLET | Freq: Every day | ORAL | 11 refills | Status: DC

## 2019-03-11 NOTE — Telephone Encounter (Signed)
Request made today to [facility].

## 2019-03-11 NOTE — Progress Notes (Signed)
[facility]    Provider:  Dr Jaynee Eagles Requesting Provider: Melina Schools, MD Primary Care Provider:  [name], DO  CC:  Back pain, neck pain, shoulder pain  HPI:  Nicholas Graham is a 68 y.o. male here as requested by Dr. Rolena Infante for upper and lower extremity weakness. PMHx of tobacco dependence in remission, obstructive sleep apnea on CPAP, inguinal hernia, pulmonary nodule, plantar fasciitis, nephrolithiasis, muscle pain, joint pain, hypertension, hyperlipidemia, GERD, chronic back pain, carpal tunnel syndrome. Started in October he had diverticulitis. He is here with his wife who also provides information. Afterwards he started having pain all over his body, aches. Now if he is out weed eating his should "goes dead" when he does any manual work. He has neck and right arm pain and also lower back pain. His legs have been swelling. They have been hard as a rock. He decreased the swelling. Is right arm hurts, he may watch TV and his arm is numb. The whole hand. The hand goes to sleep like little needles. He has weakness in his hand. Pain in the low back, hurts when he gets out of bed.   Reviewed notes, labs and imaging from outside physicians, which showed:  I reviewed Dr. Rolena Infante notes and MRI cervical spine report.  Dr. Rolena Infante did review MRI cervical spine with the patient.  I reviewed MRI of the cervical spine report, I do not have access to the images, impression showed moderate right C3-C4 neural foraminal narrowing due to moderate right uncovertebral osteophytosis, mild right C4-C5 neural foraminal narrowing due to moderate right uncovertebral osteophytosis, C5-C6 and C6-C7 showed mild bulging disc osteophyte complex and mild uncovertebral osteophytosis without spinal canal stenosis or neuroforaminal narrowing, C7-T1 showed moderate left and mild right facet arthrosis without bulging disc or disc herniation, no spinal canal stenosis or neuroforaminal narrowing.  No  cervical cord or intraspinal lesions.  He is on gabapentin for pain.  No immediate surgical intervention was thought necessary, he declined injections into his neck as he has had injections into other parts of his body without results, he also declined a pain referral.  He was sent here for evaluation of possible EMG nerve conduction study. Pain is in the middle of the back and he has a knot. His right leg gets weak, he can be walking through the house and his low back hurts. Ongoing and worsening since the first of the year.   I reviewed x-rays of the cervical spine and lumbar spine images from November 21, 2018 which were unremarkable with some degenerative changes but no significant findings.  I reviewed labs from October 2019 which included normal hemoglobin A1c, normal BMP, hepatic function panel.  I do not have MRI of the cervical spine images, will request.  Review of Systems: Patient complains of symptoms per HPI as well as the following symptoms: Snoring, weakness, decreased energy, joint pain, joint swelling, aching muscles, swelling in legs, cough, snoring. Pertinent negatives and positives per HPI. All others negative.   Social History   Socioeconomic History   Marital status: Married    Spouse name: Not on file   Number of children: 2   Years of education: Not on file   Highest education level: Not on file  Occupational History   Not on file  Social Needs   Financial resource strain: Not on file   Food insecurity    Worry: Not on file    Inability: Not on file   Transportation needs  Medical: Not on file    Non-medical: Not on file  Tobacco Use   Smoking status: Former Smoker    Packs/day: 3.00    Years: 30.00    Pack years: 90.00    Quit date: 07/16/2002    Years since quitting: 16.6   Smokeless tobacco: Never Used  Substance and Sexual Activity   Alcohol use: Yes    Alcohol/week: 1.0 - 2.0 standard drinks    Types: 1 - 2 Cans of beer per week     Comment: socially   Drug use: No   Sexual activity: Yes  Lifestyle   Physical activity    Days per week: Not on file    Minutes per session: Not on file   Stress: Not on file  Relationships   Social connections    Talks on phone: Not on file    Gets together: Not on file    Attends religious service: Not on file    Active member of club or organization: Not on file    Attends meetings of clubs or organizations: Not on file    Relationship status: Not on file   Intimate partner violence    Fear of current or ex partner: Not on file    Emotionally abused: Not on file    Physically abused: Not on file    Forced sexual activity: Not on file  Other Topics Concern   Not on file  Social History Narrative   Married, [name]. Children (2) Adult, 4 grandchildren.    9 th grade, Retired.   Wears seatbelt   Smoke detector in the home.    Firearms locked in the home.    Feels safe in his relationships.     Family History  Problem Relation Age of Onset   Lung cancer Mother    Lung cancer Father    Bone cancer Brother        bone   Heart disease Maternal Uncle    Colon cancer Neg Hx     Past Medical History:  Diagnosis Date   Arm fracture    left. Dx'd w/RSD post fracture   Carpal tunnel syndrome    Chest pain 2016   [facility] (Dr. Matthew Saras) doing stress echo, but feels like musculoskeletal chest wall pain is cause of his discomfort   Chronic back pain    Deviated septum    Diverticulosis    GERD (gastroesophageal reflux disease)    Hyperlipidemia    Hypertension    Joint pain    Muscle pain    Nephrolithiasis    OSA on CPAP 07/04/2018   - Trial of CPAP therapy on 17 cm H2O with a Large size [employer]Paykel Full Face Mask Simplus mask and heated humidification.   Plantar fasciitis    Pulmonary nodule 04/2016   [place].  Pt chose to repeat CT chest w/out contrast in 3 mo---as per radiologist's recommendations.   Right inguinal hernia     fat-containing.  Small hydrocele in left hemiscrotum.   Snoring    Swelling    of legs/feet/hands   Tobacco dependence in remission     Patient Active Problem List   Diagnosis Date Noted   Cervical neck pain with evidence of disc disease 01/15/2019   Chronic midline thoracic back pain 01/15/2019   Thoracic degenerative disc disease 01/15/2019   History of lumbar discectomy 01/15/2019   DDD (degenerative disc disease), lumbar 01/15/2019   OSA on CPAP 07/04/2018   Overweight (BMI 25.0-29.9) 05/01/2018  Benign prostatic hyperplasia with lower urinary tract symptoms 11/15/2017   Anxiety 09/12/2017   Stopped smoking with greater than 30 pack year history 07/30/2017   Pulmonary lesion 05/16/2017   History of smoking 30 or more pack years 04/24/2016   Alcohol ingestion of more than 4 drinks/week 09/10/2014   Elevated hemoglobin A1c 02/19/2014   Dyslipidemia 02/19/2014   Essential hypertension, benign 02/19/2014   History of colonic polyps 09/23/2009   GERD 12/12/2007    Past Surgical History:  Procedure Laterality Date   COLONOSCOPY     fracture repair left arm     had a fixator   INGUINAL HERNIA REPAIR Left 2013   repeat hernia repair. x2   KNEE ARTHROSCOPY Right    LUMBAR [place] inguinal after hernia repair Left    PLANTAR FASCIA SURGERY Right 2000s   UPPER GI ENDOSCOPY      Current Outpatient Medications  Medication Sig Dispense Refill   amLODipine (NORVASC) 10 MG tablet Take 1 tablet (10 mg total) by mouth daily. 90 tablet 1   aspirin 81 MG tablet Take 81 mg by mouth daily.     escitalopram (LEXAPRO) 20 MG tablet Take 1 tablet (20 mg total) by mouth daily. 90 tablet 1   gabapentin (NEURONTIN) 600 MG tablet Take 1 tablet (600 mg total) by mouth 3 (three) times daily. (Patient taking differently: Take 300 mg by mouth 3 (three) times daily. ) 270 tablet 1   hydrochlorothiazide (HYDRODIURIL) 25 MG tablet Take 1  tablet (25 mg total) by mouth daily. 90 tablet 1   lisinopril (ZESTRIL) 10 MG tablet Take 1 tablet (10 mg total) by mouth daily. 90 tablet 1   MegaRed Omega-3 Krill Oil 500 MG CAPS Take 1 capsule by mouth daily.     oxyCODONE-acetaminophen (PERCOCET) 10-325 MG tablet Take 1 tablet by mouth every 8 (eight) hours as needed for up to 30 days for pain. (Patient taking differently: Take 1 tablet by mouth every 6 (six) hours as needed for pain. ) 90 tablet 0   pantoprazole (PROTONIX) 40 MG tablet Take 1 tablet (40 mg total) by mouth daily. (Patient taking differently: Take 40 mg by mouth as needed. ) 90 tablet 3   simvastatin (ZOCOR) 10 MG tablet Take 1 tablet (10 mg total) by mouth every other day. 15 tablet 11   ALPRAZolam (XANAX) 0.25 MG tablet Take 1-2 tabs (0.25mg -0.50mg ) 30-60 minutes before procedure. May repeat if needed.Do not drive. 30 tablet 0   meloxicam (MOBIC) 15 MG tablet Take 1 tablet (15 mg total) by mouth daily. 30 tablet 11   No current facility-administered medications for this visit.     Allergies as of 03/11/2019 - Review Complete 03/11/2019  Allergen Reaction Noted   Celebrex [celecoxib]  07/16/2013   Cymbalta [duloxetine hcl]  07/16/2013   Lyrica [pregabalin]  07/16/2013   Penicillins      Vitals: BP 125/63 (BP Location: Right Arm, Patient Position: Sitting)    Pulse 83    Temp 98 F (36.7 C) Comment: wife 96.2, both temps taken by front office staff   Ht 6\' 2"  (1.88 m)    Wt 239 lb (108.4 kg)    BMI 30.69 kg/m  Last Weight:  Wt Readings from Last 1 Encounters:  03/11/19 239 lb (108.4 kg)   Last Height:   Ht Readings from Last 1 Encounters:  03/11/19 6\' 2"  (1.88 m)     Physical exam: Exam: Gen: NAD, conversant, well nourised,  obese, well groomed                     CV: RRR, no MRG. No Carotid Bruits. No peripheral edema, warm, nontender Eyes: Conjunctivae clear without exudates or hemorrhage  Neuro: Detailed Neurologic Exam  Speech:    Speech  is normal; fluent and spontaneous with normal comprehension.  Cognition:    The patient is oriented to person, place, and time;     recent and remote memory intact;     language fluent;     normal attention, concentration,     fund of knowledge Cranial Nerves:    The pupils are equal, round, and reactive to light. The fundi are normal and spontaneous venous pulsations are present. Visual fields are full to finger confrontation. Extraocular movements are intact. Trigeminal sensation is intact and the muscles of mastication are normal. The face is symmetric. The palate elevates in the midline. Hearing intact. Voice is normal. Shoulder shrug is normal. The tongue has normal motion without fasciculations.   Coordination:    No dysmetria  Gait:    Slightly antalgic  Motor Observation:    No asymmetry, no atrophy, and no involuntary movements noted. Tone:    Normal muscle tone.    Posture:    Posture is normal. normal erect    Strength: right deltoid mild weakness may be pain-related, mild weakness external rotation as well,  right decreased grip. Mild right leg prox weakness and giveway.      Otherwise strength is V/V in the upper and lower limbs.      Sensation: dec pinprick in rt median and right peroneal distribution     Reflex Exam:  DTR's:    AJs trace left and absent on the right. Deep tendon reflexes in the upper and lower extremities are slightly brisk bilaterally otherwise.   Toes:    The toes are downgoing bilaterally.   Clonus:    Clonus is absent.    Assessment/Plan: This is a 68 year old gentleman with chronic neck and low back issues.  Imaging shows arthritic, degenerative changes but at this point no obvious need for surgical intervention.  We will perform an EMG nerve conduction study on his right arm which possibly might be carpal tunnel syndrome.  Problems with his right shoulder may be musculoskeletal or arthritic as well.  I do think that his chronic neck pain  and low back pain are arthritic in nature.  But due to his chronic low back pain and reported right leg numbness and weakness with weakness seen on clinical exam I do think an MRI of the lumbar spine is warranted to evaluate for radiculopathy for surgical procedures as well as spinal stenosis.  Need MRI cervical spine on CD, will request  Likely CTS right hand: Bilateral upper extremities emg/ncs and right lower extremity for radiulopathy  Shoulder pain: may be orthopaedic, f/u with Dr. Raoul Pitch. May recommend Dr. Micheline Chapman in the future. Will eval with emg/ncs.  Neck pain: Arthritic degenerative disease. Recommend PT, Injections.   Low back pain with radiculopathy: XRay confirms L5/S1 degenerative changes needs MRI low back;  due to his chronic low back pain and reported right leg numbness and weakness with weakness seen on clinical exam I do think an MRI of the lumbar spine is warranted to evaluate for radiculopathy for surgical procedures as well as spinal stenosis.  Dr. Ernestina Patches for injections after MRI Lumbar, he wished to try a different ortho practice  PT for chronic neck,  shoulder and low back pain.  Prefers [facility] Borer if possible.   Meloxicam: take with food. He has some blood in stool with Celecoxib but he reports that was years agi and he was on several similar medcations warned him of risks.  Orders Placed This Encounter  Procedures   MR LUMBAR SPINE WO CONTRAST   Ambulatory referral to Physical Therapy   NCV with EMG(electromyography)   Meds ordered this encounter  Medications   ALPRAZolam (XANAX) 0.25 MG tablet    Sig: Take 1-2 tabs (0.25mg -0.50mg ) 30-60 minutes before procedure. May repeat if needed.Do not drive.    Dispense:  30 tablet    Refill:  0    Cc: [name], DO,  Dr. Rolena Infante, MD  Sarina Ill, MD  [facility] [address] [place], [place] [address]  Phone [phone] Fax [phone]

## 2019-03-11 NOTE — Patient Instructions (Addendum)
MRI lumbar spine EMG/NCS  Meloxicam capsules What is this medicine? MELOXICAM (mel OX i cam) is a non-steroidal anti-inflammatory drug (NSAID). It is used to reduce swelling and to treat pain. It is used for osteoarthritis. This medicine may be used for other purposes; ask your health care provider or pharmacist if you have questions. COMMON BRAND NAME(S): Vivlodex What should I tell my health care provider before I take this medicine? They need to know if you have any of these conditions: -bleeding disorders -cigarette smoker -coronary artery bypass graft (CABG) surgery within the past 2 weeks -drink more than 3 alcohol-containing drinks per day -heart disease -high blood pressure -history of stomach bleeding -kidney disease -liver disease -lung or breathing disease, like asthma -stomach or intestine problems -an unusual or allergic reaction to meloxicam, aspirin, other NSAIDs, other medicines, foods, dyes, or preservatives -pregnant or trying to get pregnant -breast-feeding How should I use this medicine? Take this medicine by mouth with a full glass of water. Follow the directions on the prescription label. You can take it with or without food. If it upsets your stomach, take it with food. Take your medicine at regular intervals. Do not take it more often than directed. Do not stop taking except on your doctor's advice. A special MedGuide will be given to you by the pharmacist with each prescription and refill. Be sure to read this information carefully each time. Talk to your pediatrician regarding the use of this medicine in children. Special care may be needed. Patients over 49 years old may have a stronger reaction and need a smaller dose. Overdosage: If you think you have taken too much of this medicine contact a poison control center or emergency room at once. NOTE: This medicine is only for you. Do not share this medicine with others. What if I miss a dose? If you miss a  dose, take it as soon as you can. If it is almost time for your next dose, take only that dose. Do not take double or extra doses. What may interact with this medicine? Do not take this medicine with any of the following medications: -cidofovir -ketorolac This medicine may also interact with the following medications: -aspirin and aspirin-like medicines -certain medicines for blood pressure, heart disease, irregular heart beat -certain medicines for depression, anxiety, or psychotic disturbances -certain medicines that treat or prevent blood clots like warfarin, enoxaparin, dalteparin, apixaban, dabigatran, rivaroxaban -cyclosporine -diuretics -methotrexate -other NSAIDs, medicines for pain and inflammation, like ibuprofen and naproxen -pemetrexed This list may not describe all possible interactions. Give your health care provider a list of all the medicines, herbs, non-prescription drugs, or dietary supplements you use. Also tell them if you smoke, drink alcohol, or use illegal drugs. Some items may interact with your medicine. What should I watch for while using this medicine? Tell your doctor or healthcare professional if your symptoms do not start to get better or if they get worse. Do not take other medicines that contain aspirin, ibuprofen, or naproxen with this medicine. Side effects such as stomach upset, nausea, or ulcers may be more likely to occur. Many medicines available without a prescription should not be taken with this medicine. This medicine can cause ulcers and bleeding in the stomach and intestines at any time during treatment. This can happen with no warning and may cause death. There is increased risk with taking this medicine for a long time. Smoking, drinking alcohol, older age, and poor health can also increase risks. Call your doctor  right away if you have stomach pain or blood in your vomit or stool. This medicine does not prevent heart attack or stroke. In fact, this  medicine may increase the chance of a heart attack or stroke. The chance may increase with longer use of this medicine and in people who have heart disease. If you take aspirin to prevent heart attack or stroke, talk with your doctor or health care professional. What side effects may I notice from receiving this medicine? Side effects that you should report to your doctor or health care professional as soon as possible: -allergic reactions like skin rash, itching or hives, swelling of the face, lips, or tongue -nausea, vomiting -signs and symptoms of a blood clot such as breathing problems; changes in vision; chest pain; severe, sudden headache; pain, swelling, warmth in the leg; trouble speaking; sudden numbness or weakness of the face, arm, or leg -signs and symptoms of bleeding such as bloody or black, tarry stools; red or dark-brown urine; spitting up blood or brown material that looks like coffee grounds; red spots on the skin; unusual bruising or bleeding from the eye, gums, or nose -signs and symptoms of liver injury like dark yellow or brown urine; general ill feeling or flu-like symptoms; light-colored stools; loss of appetite; nausea; right upper belly pain; unusually weak or tired; yellowing of the eyes or skin -signs and symptoms of stroke like changes in vision; confusion; trouble speaking or understanding; severe headaches; sudden numbness or weakness of the face, arm, or leg; trouble walking; dizziness; loss of balance or coordination Side effects that usually do not require medical attention (report to your doctor or health care professional if they continue or are bothersome): -constipation -diarrhea -gas This list may not describe all possible side effects. Call your doctor for medical advice about side effects. You may report side effects to FDA at 1-800-FDA-1088. Where should I keep my medicine? Keep out of the reach of children. Store at room temperature between 15 and 30 degrees  C (59 and 86 degrees F). Throw away any unused medicine after the expiration date. NOTE: This sheet is a summary. It may not cover all possible information. If you have questions about this medicine, talk to your doctor, pharmacist, or health care provider.  2019 Elsevier/Gold Standard (2018-01-03 11:23:51)  Alprazolam tablets What is this medicine? ALPRAZOLAM (al PRAY zoe lam) is a benzodiazepine. It is used to treat anxiety and panic attacks. This medicine may be used for other purposes; ask your health care provider or pharmacist if you have questions. COMMON BRAND NAME(S): Xanax What should I tell my health care provider before I take this medicine? They need to know if you have any of these conditions: -an alcohol or drug abuse problem -bipolar disorder, depression, psychosis or other mental health conditions -glaucoma -kidney or liver disease -lung or breathing disease -myasthenia gravis -Parkinson's disease -porphyria -seizures or a history of seizures -suicidal thoughts -an unusual or allergic reaction to alprazolam, other benzodiazepines, foods, dyes, or preservatives -pregnant or trying to get pregnant -breast-feeding How should I use this medicine? Take this medicine by mouth with a glass of water. Follow the directions on the prescription label. Take your medicine at regular intervals. Do not take it more often than directed. Do not stop taking except on your doctor's advice. A special MedGuide will be given to you by the pharmacist with each prescription and refill. Be sure to read this information carefully each time. Talk to your pediatrician regarding the use  of this medicine in children. Special care may be needed. Overdosage: If you think you have taken too much of this medicine contact a poison control center or emergency room at once. NOTE: This medicine is only for you. Do not share this medicine with others. What if I miss a dose? If you miss a dose, take it as  soon as you can. If it is almost time for your next dose, take only that dose. Do not take double or extra doses. What may interact with this medicine? Do not take this medicine with any of the following medications: -certain antiviral medicines for HIV or AIDS like delavirdine, indinavir -certain medicines for fungal infections like ketoconazole and itraconazole -narcotic medicines for cough -sodium oxybate This medicine may also interact with the following medications: -alcohol -antihistamines for allergy, cough and cold -certain antibiotics like clarithromycin, erythromycin, isoniazid, rifampin, rifapentine, rifabutin, and troleandomycin -certain medicines for blood pressure, heart disease, irregular heart beat -certain medicines for depression, like amitriptyline, fluoxetine, sertraline -certain medicines for seizures like carbamazepine, oxcarbazepine, phenobarbital, phenytoin, primidone -cimetidine -cyclosporine -male hormones, like estrogens or progestins and birth control pills, patches, rings, or injections -general anesthetics like halothane, isoflurane, methoxyflurane, propofol -grapefruit juice -local anesthetics like lidocaine, pramoxine, tetracaine -medicines that relax muscles for surgery -narcotic medicines for pain -other antiviral medicines for HIV or AIDS -phenothiazines like chlorpromazine, mesoridazine, prochlorperazine, thioridazine This list may not describe all possible interactions. Give your health care provider a list of all the medicines, herbs, non-prescription drugs, or dietary supplements you use. Also tell them if you smoke, drink alcohol, or use illegal drugs. Some items may interact with your medicine. What should I watch for while using this medicine? Tell your doctor or health care professional if your symptoms do not start to get better or if they get worse. Do not stop taking except on your doctor's advice. You may develop a severe reaction. Your  doctor will tell you how much medicine to take. You may get drowsy or dizzy. Do not drive, use machinery, or do anything that needs mental alertness until you know how this medicine affects you. To reduce the risk of dizzy and fainting spells, do not stand or sit up quickly, especially if you are an older patient. Alcohol may increase dizziness and drowsiness. Avoid alcoholic drinks. If you are taking another medicine that also causes drowsiness, you may have more side effects. Give your health care provider a list of all medicines you use. Your doctor will tell you how much medicine to take. Do not take more medicine than directed. Call emergency for help if you have problems breathing or unusual sleepiness. What side effects may I notice from receiving this medicine? Side effects that you should report to your doctor or health care professional as soon as possible: -allergic reactions like skin rash, itching or hives, swelling of the face, lips, or tongue -breathing problems -confusion -loss of balance or coordination -signs and symptoms of low blood pressure like dizziness; feeling faint or lightheaded, falls; unusually weak or tired -suicidal thoughts or other mood changes Side effects that usually do not require medical attention (report to your doctor or health care professional if they continue or are bothersome): -dizziness -dry mouth -nausea, vomiting -tiredness This list may not describe all possible side effects. Call your doctor for medical advice about side effects. You may report side effects to FDA at 1-800-FDA-1088. Where should I keep my medicine? Keep out of the reach of children. This medicine can be  abused. Keep your medicine in a safe place to protect it from theft. Do not share this medicine with anyone. Selling or giving away this medicine is dangerous and against the law. Store at room temperature between 20 and 25 degrees C (68 and 77 degrees F). This medicine may cause  accidental overdose and death if taken by other adults, children, or pets. Mix any unused medicine with a substance like cat litter or coffee grounds. Then throw the medicine away in a sealed container like a sealed bag or a coffee can with a lid. Do not use the medicine after the expiration date. NOTE: This sheet is a summary. It may not cover all possible information. If you have questions about this medicine, talk to your doctor, pharmacist, or health care provider.  2019 Elsevier/Gold Standard (2015-06-02 13:47:25)

## 2019-03-11 NOTE — Telephone Encounter (Signed)
Health team order sent to GI. No auth they will reach out to the patient to schedule.  

## 2019-03-11 NOTE — Telephone Encounter (Signed)
Deb, can u get mri c-spine from [facility].

## 2019-03-16 NOTE — Telephone Encounter (Signed)
Noted. Ready for Dr. Cathren Laine review.

## 2019-03-16 NOTE — Telephone Encounter (Signed)
R/c c spine mri from [facility]. Pt report and Cd on [employer].

## 2019-03-18 DIAGNOSIS — G4733 Obstructive sleep apnea (adult) (pediatric): Secondary | ICD-10-CM | POA: Diagnosis not present

## 2019-03-19 NOTE — Telephone Encounter (Signed)
Done

## 2019-03-19 NOTE — Telephone Encounter (Signed)
Thanks Bethany, patient has an appointment for emg/ncs and that is when I will need the CD. Would you put it in the document pile for that day so I have it then? thanks

## 2019-03-26 ENCOUNTER — Other Ambulatory Visit: Payer: Self-pay | Admitting: Neurology

## 2019-03-26 ENCOUNTER — Other Ambulatory Visit: Payer: Self-pay | Admitting: Family Medicine

## 2019-04-02 ENCOUNTER — Ambulatory Visit (INDEPENDENT_AMBULATORY_CARE_PROVIDER_SITE_OTHER): Payer: PPO | Admitting: Neurology

## 2019-04-02 ENCOUNTER — Other Ambulatory Visit: Payer: Self-pay

## 2019-04-02 DIAGNOSIS — M79604 Pain in right leg: Secondary | ICD-10-CM

## 2019-04-02 DIAGNOSIS — M19019 Primary osteoarthritis, unspecified shoulder: Secondary | ICD-10-CM | POA: Diagnosis not present

## 2019-04-02 DIAGNOSIS — G5601 Carpal tunnel syndrome, right upper limb: Secondary | ICD-10-CM

## 2019-04-02 DIAGNOSIS — M5417 Radiculopathy, lumbosacral region: Secondary | ICD-10-CM | POA: Diagnosis not present

## 2019-04-02 DIAGNOSIS — M25511 Pain in right shoulder: Secondary | ICD-10-CM

## 2019-04-02 DIAGNOSIS — M19039 Primary osteoarthritis, unspecified wrist: Secondary | ICD-10-CM | POA: Diagnosis not present

## 2019-04-02 DIAGNOSIS — Z0289 Encounter for other administrative examinations: Secondary | ICD-10-CM

## 2019-04-02 DIAGNOSIS — M79601 Pain in right arm: Secondary | ICD-10-CM

## 2019-04-02 NOTE — Progress Notes (Signed)
History: Patient with right leg weakness and sensory changes as well as L% compression seen on MRi which is consistent with L5 radiculopathy. CTS right hand clinically but cannot prove on emg/ncs recommend steroid injectios and conservative meaures and if worsens repeat emg/ncs in 6-9 months. I reviewed the MRI of the cervical spine  Images with patient and wife and emg/ncs results. No evidence for upper extremity CTS or radiculopathy.  At time of this appointment MRI L-spine was pending, but since it confirms L5 radiculopathy and patient has requested to see Neurosurgery not Orthopaedics.   Discussed his right arm symptoms which include right should pain. I suspect much overuse and arthritis he bends over a lot, he uses his wrist a lot he uses his right arm a lot. He is doing a lot of movements that exacerbate his arthritis in the wrist, low back, shoulder and neck. Recommend injections in the right wrist and avoiding activities that exacerbate his symptoms. Already had lumbar surgery and knee surgery and injury to the right wrist. He uses a weed eater, he bends over a lot, again stop anything exacerbating symptoms.  Recommend: Heat, ice. lidoderm patches, dry needling, PT for his shoulder pain, stopping any repetitive actions that exacerbate pain, cervical injections for neck pain.   Referral sent to Dr. Vertell Limber at [facility] Neurosurgery for right L5 radiculopathy.  Personally reviewed images and agree with the following: At L4-L5 there are degenerative changes causing moderately severe right lateral recess stenosis that could lead to right L5 nerve root compression.  A total of 25 minutes was spent face-to-face with this patient. Over half this time was spent on counseling patient on the  1. Lumbosacral radiculopathy at L5   2. Right leg pain   3. Arthritis pain of shoulder   4. Wrist arthritis    diagnosis and different diagnostic and therapeutic options, counseling and coordination of care, risks  ans benefits of management, compliance, or risk factor reduction and education.  This does not include time spent on emg/ncs (see procedure note)

## 2019-04-04 ENCOUNTER — Ambulatory Visit
Admission: RE | Admit: 2019-04-04 | Discharge: 2019-04-04 | Disposition: A | Discharge status: Home / Self Care | Payer: PPO | Source: Ambulatory Visit | Attending: Neurology | Admitting: Neurology

## 2019-04-04 ENCOUNTER — Other Ambulatory Visit: Payer: Self-pay

## 2019-04-04 DIAGNOSIS — G8929 Other chronic pain: Secondary | ICD-10-CM

## 2019-04-04 DIAGNOSIS — R29898 Other symptoms and signs involving the musculoskeletal system: Secondary | ICD-10-CM

## 2019-04-04 DIAGNOSIS — R200 Anesthesia of skin: Secondary | ICD-10-CM

## 2019-04-04 DIAGNOSIS — M5441 Lumbago with sciatica, right side: Secondary | ICD-10-CM

## 2019-04-04 DIAGNOSIS — M5410 Radiculopathy, site unspecified: Secondary | ICD-10-CM

## 2019-04-04 DIAGNOSIS — M79604 Pain in right leg: Secondary | ICD-10-CM

## 2019-04-06 DIAGNOSIS — G4733 Obstructive sleep apnea (adult) (pediatric): Secondary | ICD-10-CM | POA: Diagnosis not present

## 2019-04-06 NOTE — Progress Notes (Signed)
See procedure note.

## 2019-04-06 NOTE — Progress Notes (Signed)
Full Name: Nicholas Graham Gender: Male MRN #: [id number] Date of Birth: 1951/04/10    Visit Date: 04/02/2019 09:17 Age: 69 Years 59 Months Old Examining Physician: Sarina Ill, MD   History:This is a 68 year old gentleman with chronic neck and low back issues. chronic low back pain and reported right leg numbness and weakness with weakness seen on clinical exam.  MRI of the lumbar spine consistent with right L5 radiculopathy.  Summary: EMG/NCS was performed on the right upper and right lower extremities. The right Tibial motor nerve showed reduced amplitude (1.35mV, N>4). The right Superficial Peroneal sensory nerve showed delayed distal peak latency(4.9ms, N<4.4) and reduced amplitude(3uV, N>6). The left and right Tibial H Reflex showed delayed latencies (40.70ms, 43ms, N<35). The right Tibial F Wave showed delayed Latency (70.84ms, N<56). All remaining nerves and all muscles (as indicated in the following tables) were within normal limits.       Conclusion: EMG did not show acute ongoing denervation or chronic neurogenic changes in the right lower extremity however clinical presentation, abnormal right tibial motor nerve conductions and delayed tibial F wave as well as MRI of the lumbar spine showing right L5 nerve compression consistent with right L5 radiculopathy.  He also has mild sensory changes and delayed H reflexes bilaterally consistent with concomitant distal LE mild sensory polyneuropathy.  No electrophysiologic evidence for upper extremity mononeuropathy, polyneuropathy or cervical radiculopathy.   Sarina Ill, M.D.  [facility] [place], [place] [address] Tel: [phone] Fax: [phone]        [facility]    Nerve / Sites Muscle Latency Ref. Amplitude Ref. Rel Amp Segments Distance Velocity Ref. Area    ms ms mV mV %  cm m/s m/s mVms  R Median - APB     Wrist APB 3.3 ?4.4 7.4 ?4.0 100 Wrist - APB 7   21.9     Upper arm APB 8.3  7.2  96.9  Upper arm - Wrist 27 54 ?49 20.9  R Ulnar - ADM     Wrist ADM 2.4 ?3.3 12.1 ?6.0 100 Wrist - ADM 7   33.2     B.Elbow ADM 6.3  10.8  89 B.Elbow - Wrist 23 59 ?49 32.1     A.Elbow ADM 8.2  10.5  97 A.Elbow - B.Elbow 10 52 ?49 32.3         A.Elbow - Wrist      R Peroneal - EDB     Ankle EDB 4.3 ?6.5 6.9 ?2.0 100 Ankle - EDB 9   21.4     Fib head EDB 12.6  5.2  75.8 Fib head - Ankle 33 44 ?44 19.7     Pop fossa EDB 15.1  4.7  90.4 Pop fossa - Fib head 10 44 ?44 19.3         Pop fossa - Ankle      R Tibial - AH     Ankle AH 4.1 ?5.8 1.6 ?4.0 100 Ankle - AH 9   9.7     Pop fossa AH 14.5  1.4  92.9 Pop fossa - Ankle 41 41 ?41 6.9             SNC    Nerve / Sites Rec. Site Peak Lat Ref.  Amp Ref. Segments Distance Peak Diff Ref.    ms ms V V  cm ms ms  R Radial - Anatomical snuff box (Forearm)     Forearm Wrist  2.0 ?2.9 17 ?15 Forearm - Wrist 10    R Sural - Ankle (Calf)     Calf Ankle 3.7 ?4.4 7 ?6 Calf - Ankle 14    R Superficial peroneal - Ankle     Lat leg Ankle 4.7 ?4.4 3 ?6 Lat leg - Ankle 14    R Median, Ulnar - Transcarpal comparison     Median Palm Wrist 2.0 ?2.2 30 ?35 Median Palm - Wrist 8       Ulnar Palm Wrist 2.1 ?2.2 7 ?12 Ulnar Palm - Wrist 8          Median Palm - Ulnar Palm  -0.1 ?0.4  R Median - Orthodromic (Dig II, Mid palm)     Dig II Wrist 3.0 ?3.4 10 ?10 Dig II - Wrist 13    R Ulnar - Orthodromic, (Dig V, Mid palm)     Dig V Wrist 3.1 ?3.1 5 ?5 Dig V - Wrist 80                   F  Wave    Nerve F Lat Ref.   ms ms  R Tibial - AH 70.3 ?56.0  R Ulnar - ADM 30.5 ?32.0         H Reflex    Nerve H Lat    ms    Left Right Ref.     Tibial - Soleus 40.7 40.0 ?35.0                EMG full       EMG Summary Table    Spontaneous MUAP Recruitment  Muscle IA Fib PSW Fasc Other Amp Dur. Poly Pattern  R. Deltoid Normal None None None _______ Normal Normal Normal Normal  R. Biceps brachii Normal None None None _______ Normal Normal Normal Normal  R. Triceps  brachii Normal None None None _______ Normal Normal Normal Normal  R Pronator teres Normal None None None _______ Normal Normal Normal Normal  R. First dorsal interosseous Normal None None None _______ Normal Normal Normal Normal  R. Opponens pollicis Normal None None None _______ Normal Normal Normal Normal  R. Cervical paraspinals (low) Normal None None None _______ Normal Normal Normal Normal  R. Lumbar paraspinals (low) Normal None None None _______ Normal Normal Normal Normal  R. Vastus medialis Normal None None None _______ Normal Normal Normal Normal  R. Tibialis anterior Normal None None None _______ Normal Normal Normal Normal  R. Gastrocnemius (Medial head) Normal None None None _______ Normal Normal Normal Normal  R. Extensor hallucis longus Normal None None None _______ Normal Normal Normal Normal  R. Biceps femoris (long head) Normal None None None _______ Normal Normal Normal Normal  R Gluteus maximus Normal None None None _______ Normal Normal Normal Normal  R. Gluteus medius Normal None None None _______ Normal Normal Normal Normal  R. Lumbar paraspinals(low) Normal None None None _______ Normal Normal Normal Normal

## 2019-04-08 ENCOUNTER — Telehealth: Payer: Self-pay | Admitting: Neurology

## 2019-04-08 DIAGNOSIS — M5417 Radiculopathy, lumbosacral region: Secondary | ICD-10-CM

## 2019-04-08 NOTE — Telephone Encounter (Signed)
Please call patient and let him know it looks like he has right L5 pinched nerve. We can send him  for evaluation of injections or surgery but he did not want to see Dr. Rolena Infante we can send him to Neurosurgery if he like, Dr. Vertell Limber.

## 2019-04-08 NOTE — Telephone Encounter (Signed)
Pt states he had his MRI on Saturday, he is asking for a call with the results as soon as they are available

## 2019-04-08 NOTE — Telephone Encounter (Signed)
I sent them through my chart see result note thanks

## 2019-04-09 NOTE — Addendum Note (Signed)
Addended by: Gildardo Griffes on: 04/09/2019 12:06 PM   Modules accepted: Orders

## 2019-04-09 NOTE — Telephone Encounter (Signed)
I spoke with Mr. Theiler. He verbalized  Understanding of the MRI results and he would like to be referred to Dr. Vertell Limber @ [facility] neurosurgery. He was concerned about whether they accept his insurance but will check with them when he receives a call from their office.   Referral placed.

## 2019-04-12 NOTE — Procedures (Signed)
Full Name: Nicholas Graham Gender: Male MRN #: [id number] Date of Birth: 1951-03-21    Visit Date: 04/02/2019 09:17 Age: 68 Years 78 Months Old Examining Physician: Sarina Ill, MD   History:This is a 68 year old gentleman with chronic neck and low back issues. chronic low back pain and reported right leg numbness and weakness with weakness seen on clinical exam.  MRI of the lumbar spine consistent with right L5 radiculopathy.  Summary: EMG/NCS was performed on the right upper and right lower extremities. The right Tibial motor nerve showed reduced amplitude (1.10mV, N>4). The right Superficial Peroneal sensory nerve showed delayed distal peak latency(4.61ms, N<4.4) and reduced amplitude(3uV, N>6). The left and right Tibial H Reflex showed delayed latencies (40.53ms, 84ms, N<35). The right Tibial F Wave showed delayed Latency (70.1ms, N<56). All remaining nerves and all muscles (as indicated in the following tables) were within normal limits.       Conclusion: EMG did not show acute ongoing denervation or chronic neurogenic changes in the right lower extremity however clinical presentation, abnormal right tibial motor nerve conductions and delayed tibial F wave as well as MRI of the lumbar spine showing right L5 nerve compression consistent with right L5 radiculopathy.  He also has mild sensory changes and delayed H reflexes bilaterally consistent with concomitant distal LE mild sensory polyneuropathy.  No electrophysiologic evidence for upper extremity mononeuropathy, polyneuropathy or cervical radiculopathy.   Sarina Ill, M.D.  [facility] [place], [place] [address] Tel: [phone] Fax: [phone]        [facility]    Nerve / Sites Muscle Latency Ref. Amplitude Ref. Rel Amp Segments Distance Velocity Ref. Area    ms ms mV mV %  cm m/s m/s mVms  R Median - APB     Wrist APB 3.3 ?4.4 7.4 ?4.0 100 Wrist - APB 7   21.9     Upper arm APB 8.3  7.2  96.9  Upper arm - Wrist 27 54 ?49 20.9  R Ulnar - ADM     Wrist ADM 2.4 ?3.3 12.1 ?6.0 100 Wrist - ADM 7   33.2     B.Elbow ADM 6.3  10.8  89 B.Elbow - Wrist 23 59 ?49 32.1     A.Elbow ADM 8.2  10.5  97 A.Elbow - B.Elbow 10 52 ?49 32.3         A.Elbow - Wrist      R Peroneal - EDB     Ankle EDB 4.3 ?6.5 6.9 ?2.0 100 Ankle - EDB 9   21.4     Fib head EDB 12.6  5.2  75.8 Fib head - Ankle 33 44 ?44 19.7     Pop fossa EDB 15.1  4.7  90.4 Pop fossa - Fib head 10 44 ?44 19.3         Pop fossa - Ankle      R Tibial - AH     Ankle AH 4.1 ?5.8 1.6 ?4.0 100 Ankle - AH 9   9.7     Pop fossa AH 14.5  1.4  92.9 Pop fossa - Ankle 41 41 ?41 6.9             SNC    Nerve / Sites Rec. Site Peak Lat Ref.  Amp Ref. Segments Distance Peak Diff Ref.    ms ms V V  cm ms ms  R Radial - Anatomical snuff box (Forearm)     Forearm Wrist  2.0 ?2.9 17 ?15 Forearm - Wrist 10    R Sural - Ankle (Calf)     Calf Ankle 3.7 ?4.4 7 ?6 Calf - Ankle 14    R Superficial peroneal - Ankle     Lat leg Ankle 4.7 ?4.4 3 ?6 Lat leg - Ankle 14    R Median, Ulnar - Transcarpal comparison     Median Palm Wrist 2.0 ?2.2 30 ?35 Median Palm - Wrist 8       Ulnar Palm Wrist 2.1 ?2.2 7 ?12 Ulnar Palm - Wrist 8          Median Palm - Ulnar Palm  -0.1 ?0.4  R Median - Orthodromic (Dig II, Mid palm)     Dig II Wrist 3.0 ?3.4 10 ?10 Dig II - Wrist 13    R Ulnar - Orthodromic, (Dig V, Mid palm)     Dig V Wrist 3.1 ?3.1 5 ?5 Dig V - Wrist 49                   F  Wave    Nerve F Lat Ref.   ms ms  R Tibial - AH 70.3 ?56.0  R Ulnar - ADM 30.5 ?32.0         H Reflex    Nerve H Lat    ms    Left Right Ref.     Tibial - Soleus 40.7 40.0 ?35.0                EMG full       EMG Summary Table    Spontaneous MUAP Recruitment  Muscle IA Fib PSW Fasc Other Amp Dur. Poly Pattern  R. Deltoid Normal None None None _______ Normal Normal Normal Normal  R. Biceps brachii Normal None None None _______ Normal Normal Normal Normal  R. Triceps  brachii Normal None None None _______ Normal Normal Normal Normal  R Pronator teres Normal None None None _______ Normal Normal Normal Normal  R. First dorsal interosseous Normal None None None _______ Normal Normal Normal Normal  R. Opponens pollicis Normal None None None _______ Normal Normal Normal Normal  R. Cervical paraspinals (low) Normal None None None _______ Normal Normal Normal Normal  R. Lumbar paraspinals (low) Normal None None None _______ Normal Normal Normal Normal  R. Vastus medialis Normal None None None _______ Normal Normal Normal Normal  R. Tibialis anterior Normal None None None _______ Normal Normal Normal Normal  R. Gastrocnemius (Medial head) Normal None None None _______ Normal Normal Normal Normal  R. Extensor hallucis longus Normal None None None _______ Normal Normal Normal Normal  R. Biceps femoris (long head) Normal None None None _______ Normal Normal Normal Normal  R Gluteus maximus Normal None None None _______ Normal Normal Normal Normal  R. Gluteus medius Normal None None None _______ Normal Normal Normal Normal  R. Lumbar paraspinals(low) Normal None None None _______ Normal Normal Normal Normal

## 2019-04-18 DIAGNOSIS — G4733 Obstructive sleep apnea (adult) (pediatric): Secondary | ICD-10-CM | POA: Diagnosis not present

## 2019-05-12 ENCOUNTER — Other Ambulatory Visit: Payer: Self-pay | Admitting: Family Medicine

## 2019-05-19 DIAGNOSIS — G4733 Obstructive sleep apnea (adult) (pediatric): Secondary | ICD-10-CM | POA: Diagnosis not present

## 2019-06-18 DIAGNOSIS — G4733 Obstructive sleep apnea (adult) (pediatric): Secondary | ICD-10-CM | POA: Diagnosis not present

## 2019-06-19 ENCOUNTER — Other Ambulatory Visit: Payer: Self-pay | Admitting: Family Medicine

## 2019-06-19 DIAGNOSIS — E785 Hyperlipidemia, unspecified: Secondary | ICD-10-CM

## 2019-06-19 DIAGNOSIS — I10 Essential (primary) hypertension: Secondary | ICD-10-CM

## 2019-06-19 DIAGNOSIS — K2100 Gastro-esophageal reflux disease with esophagitis, without bleeding: Secondary | ICD-10-CM

## 2019-06-19 NOTE — Telephone Encounter (Signed)
Called patient and he stated that he has enough medication to last him until his appointment on tuesday

## 2019-06-23 ENCOUNTER — Encounter: Payer: Self-pay | Admitting: Family Medicine

## 2019-06-23 ENCOUNTER — Other Ambulatory Visit: Payer: Self-pay

## 2019-06-23 ENCOUNTER — Ambulatory Visit (INDEPENDENT_AMBULATORY_CARE_PROVIDER_SITE_OTHER): Payer: PPO | Admitting: Family Medicine

## 2019-06-23 VITALS — BP 136/70 | HR 74 | Temp 98.3°F | Resp 16 | Ht 74.0 in | Wt 233.4 lb

## 2019-06-23 DIAGNOSIS — E663 Overweight: Secondary | ICD-10-CM | POA: Diagnosis not present

## 2019-06-23 DIAGNOSIS — R7309 Other abnormal glucose: Secondary | ICD-10-CM | POA: Diagnosis not present

## 2019-06-23 DIAGNOSIS — K2100 Gastro-esophageal reflux disease with esophagitis, without bleeding: Secondary | ICD-10-CM | POA: Diagnosis not present

## 2019-06-23 DIAGNOSIS — K219 Gastro-esophageal reflux disease without esophagitis: Secondary | ICD-10-CM | POA: Insufficient documentation

## 2019-06-23 DIAGNOSIS — E785 Hyperlipidemia, unspecified: Secondary | ICD-10-CM | POA: Diagnosis not present

## 2019-06-23 DIAGNOSIS — I10 Essential (primary) hypertension: Secondary | ICD-10-CM

## 2019-06-23 DIAGNOSIS — F419 Anxiety disorder, unspecified: Secondary | ICD-10-CM

## 2019-06-23 LAB — CBC WITH DIFFERENTIAL/PLATELET
Basophils Absolute: 0 10*3/uL (ref 0.0–0.1)
Basophils Relative: 0.8 % (ref 0.0–3.0)
Eosinophils Absolute: 0.1 10*3/uL (ref 0.0–0.7)
Eosinophils Relative: 1.3 % (ref 0.0–5.0)
HCT: 48.8 % (ref 39.0–52.0)
Hemoglobin: 16.2 g/dL (ref 13.0–17.0)
Lymphocytes Relative: 27.3 % (ref 12.0–46.0)
Lymphs Abs: 1.4 10*3/uL (ref 0.7–4.0)
MCHC: 33.2 g/dL (ref 30.0–36.0)
MCV: 92.8 fl (ref 78.0–100.0)
Monocytes Absolute: 0.5 10*3/uL (ref 0.1–1.0)
Monocytes Relative: 9.8 % (ref 3.0–12.0)
Neutro Abs: 3.1 10*3/uL (ref 1.4–7.7)
Neutrophils Relative %: 60.8 % (ref 43.0–77.0)
Platelets: 158 10*3/uL (ref 150.0–400.0)
RBC: 5.25 Mil/uL (ref 4.22–5.81)
RDW: 13.8 % (ref 11.5–15.5)
WBC: 5.1 10*3/uL (ref 4.0–10.5)

## 2019-06-23 LAB — COMPREHENSIVE METABOLIC PANEL
ALT: 20 U/L (ref 0–53)
AST: 20 U/L (ref 0–37)
Albumin: 4.8 g/dL (ref 3.5–5.2)
Alkaline Phosphatase: 60 U/L (ref 39–117)
BUN: 16 mg/dL (ref 6–23)
CO2: 30 mEq/L (ref 19–32)
Calcium: 10.3 mg/dL (ref 8.4–10.5)
Chloride: 102 mEq/L (ref 96–112)
Creatinine, Ser: 0.98 mg/dL (ref 0.40–1.50)
GFR: 76.54 mL/min (ref >60.00)
Glucose, Bld: 112 mg/dL — ABNORMAL HIGH (ref 70–99)
Potassium: 4.6 mEq/L (ref 3.5–5.1)
Sodium: 141 mEq/L (ref 135–145)
Total Bilirubin: 0.8 mg/dL (ref 0.2–1.2)
Total Protein: 7.1 g/dL (ref 6.0–8.3)

## 2019-06-23 LAB — LIPID PANEL
Cholesterol: 184 mg/dL (ref 0–200)
HDL: 35.9 mg/dL — ABNORMAL LOW (ref >39.00)
LDL Cholesterol: 111 mg/dL — ABNORMAL HIGH (ref 0–99)
NonHDL: 148.52
Total CHOL/HDL Ratio: 5
Triglycerides: 187 mg/dL — ABNORMAL HIGH (ref 0.0–149.0)
VLDL: 37.4 mg/dL (ref 0.0–40.0)

## 2019-06-23 LAB — HEMOGLOBIN A1C: Hgb A1c MFr Bld: 5.8 % (ref 4.6–6.5)

## 2019-06-23 LAB — TSH: TSH: 1.68 u[IU]/mL (ref 0.35–4.50)

## 2019-06-23 MED ORDER — HYDROCHLOROTHIAZIDE 25 MG PO TABS: 25.0000 mg | ORAL_TABLET | Freq: Every day | ORAL | 1 refills | Status: DC

## 2019-06-23 MED ORDER — PANTOPRAZOLE SODIUM 40 MG PO TBEC: 40.0000 mg | DELAYED_RELEASE_TABLET | Freq: Every day | ORAL | 3 refills | Status: DC

## 2019-06-23 MED ORDER — AMLODIPINE BESYLATE 10 MG PO TABS: 10.0000 mg | ORAL_TABLET | Freq: Every day | ORAL | 1 refills | Status: DC

## 2019-06-23 MED ORDER — SIMVASTATIN 10 MG PO TABS: ORAL_TABLET | ORAL | 11 refills | Status: DC

## 2019-06-23 MED ORDER — ESCITALOPRAM OXALATE 20 MG PO TABS: 20.0000 mg | ORAL_TABLET | Freq: Every day | ORAL | 1 refills | Status: DC

## 2019-06-23 NOTE — Patient Instructions (Signed)
It was great to see you today.   Follow up in 6 months on chronic conditions.

## 2019-06-23 NOTE — Progress Notes (Signed)
Nicholas Graham , January 02, 1951, 68 y.o., male MRN: [id number] Patient Care Team    Relationship Specialty Notifications Start End  [name], DO PCP - General Family Medicine  10/31/15   [name], MD (Inactive) Consulting Physician Gastroenterology  04/24/16     Chief Complaint  Patient presents with  . Anxiety    takes medication daily.   . Hypertension    patient takes medication daily. does not check bp at home.      Subjective: Pt presents for an OV for [facility]  Hypertension/HLD/overweight: Pt reports compliancewith amlodipine 10 mg daily. Patient denies chest pain, shortness of breath, dizziness or lower extremity edema.  Pt takes a daily baby ASA. Pt is prescribed statin. HCTZ removed from regimen 2/2 BPH sx--> however he restarted this on his own and is tolerating. BMP:10/3/2019within normal limits CBC:06/19/2018 normal limits Lipid:06/19/2018 total cholesterol 167, HDL 38, LDL 103, triglycerides 132 TSH: 09/12/2017 2.31 Diet: Low-sodium Exercise: Not exercising as routinely. RF: Hypertension, hyperlipidemia, family history of heart disease. Overweight.  ANXIETY:  Doing well with use of lexapro 20  Mg qd. No complaints. Desires refills.  Gerd: Uses PPI as needed. Reports he is doing well.   Depression screen [facility] 2/9 06/23/2019 06/19/2018 12/13/2017 11/15/2017 09/12/2017  Decreased Interest 0 0 0 0 0  Down, Depressed, Hopeless 0 0 0 0 0  PHQ - 2 Score 0 0 0 0 0  Altered sleeping - - - 3 3  Tired, decreased energy - - - 0 0  Change in appetite - - - 0 0  Feeling bad or failure about yourself  - - - 0 0  Trouble concentrating - - - 0 0  Moving slowly or fidgety/restless - - - 0 0  Suicidal thoughts - - - 0 0  PHQ-9 Score - - - 3 3  Difficult doing work/chores - - - Not difficult at all -    Allergies  Allergen Reactions  . Celebrex [Celecoxib]   . Cymbalta [Duloxetine Hcl]   . Lyrica [Pregabalin]   . Penicillins     REACTION: hives   Social History    Social History Narrative   Married, Arts administrator. Children (2) Adult, 4 grandchildren.    9 th grade, Retired.   Wears seatbelt   Smoke detector in the home.    Firearms locked in the home.    Feels safe in his relationships.    Past Medical History:  Diagnosis Date  . Arm fracture    left. Dx'd w/RSD post fracture  . Carpal tunnel syndrome   . Chest pain 2016   [facility] (Dr. Matthew Saras) doing stress echo, but feels like musculoskeletal chest wall pain is cause of his discomfort  . Chronic back pain   . Deviated septum   . Diverticulosis   . GERD (gastroesophageal reflux disease)   . Hyperlipidemia   . Hypertension   . Joint pain   . Muscle pain   . Nephrolithiasis   . OSA on CPAP 07/04/2018   - Trial of CPAP therapy on 17 cm H2O with a Large size Fisher&Paykel Full Face Mask Simplus mask and heated humidification.  . Plantar fasciitis   . Pulmonary nodule 04/2016   [place].  Pt chose to repeat CT chest w/out contrast in 3 mo---as per radiologist's recommendations.  . Right inguinal hernia    fat-containing.  Small hydrocele in left hemiscrotum.  . Snoring   . Swelling    of legs/feet/hands  .  Tobacco dependence in remission    Past Surgical History:  Procedure Laterality Date  . COLONOSCOPY    . fracture repair left arm     had a fixator  . INGUINAL HERNIA REPAIR Left 2013   repeat hernia repair. x2  . KNEE ARTHROSCOPY Right   . [place] SURGERY  1993  . NEURECTOMY inguinal after hernia repair Left   . PLANTAR FASCIA SURGERY Right 2000s  . UPPER GI ENDOSCOPY     Family History  Problem Relation Age of Onset  . Lung cancer Mother   . Lung cancer Father   . Bone cancer Brother        bone  . Heart disease Maternal Uncle   . Colon cancer Neg Hx    Allergies as of 06/23/2019      Reactions   Celebrex [celecoxib]    Cymbalta [duloxetine Hcl]    Lyrica [pregabalin]    Penicillins    REACTION: hives      Medication List       Accurate as of  June 23, 2019  9:44 AM. If you have any questions, ask your nurse or doctor.        STOP taking these medications   ALPRAZolam 0.25 MG tablet Commonly known as: Xanax Stopped by: Howard Pouch, DO   gabapentin 600 MG tablet Commonly known as: NEURONTIN Stopped by: Howard Pouch, DO   lisinopril 10 MG tablet Commonly known as: ZESTRIL Stopped by: Howard Pouch, DO   oxyCODONE-acetaminophen 10-325 MG tablet Commonly known as: PERCOCET Stopped by: Howard Pouch, DO     TAKE these medications   amLODipine 10 MG tablet Commonly known as: NORVASC Take 1 tablet (10 mg total) by mouth daily.   aspirin 81 MG tablet Take 81 mg by mouth daily.   escitalopram 20 MG tablet Commonly known as: LEXAPRO Take 1 tablet (20 mg total) by mouth daily.   hydrochlorothiazide 25 MG tablet Commonly known as: HYDRODIURIL Take 1 tablet (25 mg total) by mouth daily.   MegaRed Omega-3 Krill Oil 500 MG Caps Take 1 capsule by mouth daily.   meloxicam 15 MG tablet Commonly known as: MOBIC Take 1 tablet (15 mg total) by mouth daily.   pantoprazole 40 MG tablet Commonly known as: PROTONIX Take 1 tablet (40 mg total) by mouth daily.   simvastatin 10 MG tablet Commonly known as: ZOCOR TAKE ONE TABLET BY MOUTH EVERY OTHER DAY       All past medical history, surgical history, allergies, family history, immunizations andmedications were updated in the EMR today and reviewed under the history and medication portions of their EMR.     ROS: Negative, with the exception of above mentioned in HPI   Objective:  BP 136/70 (BP Location: Left Arm, Patient Position: Sitting, Cuff Size: Large)   Pulse 74   Temp 98.3 F (36.8 C) (Temporal)   Resp 16   Ht _0  (1.88 m)   Wt 233 lb 6 oz (105.9 kg)   SpO2 96%   BMI 29.96 kg/m  Body mass index is 29.96 kg/m. Gen: Afebrile. No acute distress. Nontoxic in appearance, well developed, well nourished.  HENT: AT. [place].  Eyes:Pupils Equal Round Reactive to  light, Extraocular movements intact,  Conjunctiva without redness, discharge or icterus. Neck/lymp/endocrine: Supple,no lymphadenopathy CV: RRR no murmur, no edema Chest: CTAB, no wheeze or crackles. Good air movement, normal resp effort.  Abd: Soft. NTND. BS present. no Masses palpated. No rebound or guarding. Neuro:  Normal  gait. PERLA. EOMi. Alert. Oriented x3  Psych: Normal affect, dress and demeanor. Normal speech. Normal thought content and judgment.  No exam data present No results found. No results found for this or any previous visit (from the past 24 hour(s)).  Assessment/Plan: ORVIL FARAONE is a 68 y.o. male present for OV for  Essential hypertension, benign/dyslipidemia/overweight - stable - Refills provided today on his amlodipine 10 mg daily and HCTZ. - Continue Zocor 10 mg daily.  - lipids, cbc, cmp, tsh collected today -continuefish oil. - continue ASA 81 mg - routine diet and exercise. - F/U 6 months with provider on hypertension.   Gastroesophageal reflux disease with esophagitis stable.  Continue Protonix PRN Elevated a1c: a1c collected today/  Anxiety:  Stable. Refilled/continue lexapro 20 mg qd.    F/u 6 mos [facility]. Pt declines all immunizations today.    Reviewed expectations re: course of current medical issues.  Discussed self-management of symptoms.  Outlined signs and symptoms indicating need for more acute intervention.  Patient verbalized understanding and all questions were answered.  Patient received an After-Visit Summary.    Orders Placed This Encounter  Procedures  . CBC w/Diff  . Comp Met (CMET)  . TSH  . Lipid panel  . Hemoglobin A1c     Note is dictated utilizing voice recognition software. Although note has been proof read prior to signing, occasional typographical errors still can be missed. If any questions arise, please do not hesitate to call for verification.   electronically signed by:  Howard Pouch, DO  [place]

## 2019-07-01 DIAGNOSIS — M5126 Other intervertebral disc displacement, lumbar region: Secondary | ICD-10-CM | POA: Diagnosis not present

## 2019-07-01 DIAGNOSIS — M542 Cervicalgia: Secondary | ICD-10-CM | POA: Diagnosis not present

## 2019-07-01 DIAGNOSIS — M5412 Radiculopathy, cervical region: Secondary | ICD-10-CM | POA: Diagnosis not present

## 2019-07-01 DIAGNOSIS — M5416 Radiculopathy, lumbar region: Secondary | ICD-10-CM | POA: Diagnosis not present

## 2019-07-02 DIAGNOSIS — Z0289 Encounter for other administrative examinations: Secondary | ICD-10-CM | POA: Diagnosis not present

## 2019-07-17 DIAGNOSIS — I10 Essential (primary) hypertension: Secondary | ICD-10-CM | POA: Diagnosis not present

## 2019-07-17 DIAGNOSIS — M542 Cervicalgia: Secondary | ICD-10-CM | POA: Diagnosis not present

## 2019-07-17 DIAGNOSIS — M5412 Radiculopathy, cervical region: Secondary | ICD-10-CM | POA: Diagnosis not present

## 2019-07-17 DIAGNOSIS — G5601 Carpal tunnel syndrome, right upper limb: Secondary | ICD-10-CM | POA: Diagnosis not present

## 2019-07-19 DIAGNOSIS — G4733 Obstructive sleep apnea (adult) (pediatric): Secondary | ICD-10-CM | POA: Diagnosis not present

## 2019-07-28 DIAGNOSIS — G4733 Obstructive sleep apnea (adult) (pediatric): Secondary | ICD-10-CM | POA: Diagnosis not present

## 2019-08-05 ENCOUNTER — Telehealth: Payer: Self-pay | Admitting: Family Medicine

## 2019-08-05 NOTE — Telephone Encounter (Signed)
CallerJamichael Graham Ph#SN:[id number]   Pt is calling about pain and cannot have shots until Dec 14th with Dr. Maryjean Ka. He is asking about muscle relaxers or other pain control until that time. Please advise.   [place], [place] - [address] [phone] (Phone) [phone] (Fax)

## 2019-08-05 NOTE — Telephone Encounter (Signed)
Called patient and scheduled appointment for Friday at 1:30pm for back and shoulder pain. Patient called neuro and they gave him Voltaren gel. Patient stated that cream is not working and wanted to talk to Dr. Raoul Pitch.

## 2019-08-06 ENCOUNTER — Other Ambulatory Visit: Payer: Self-pay | Admitting: Family Medicine

## 2019-08-06 DIAGNOSIS — M5134 Other intervertebral disc degeneration, thoracic region: Secondary | ICD-10-CM

## 2019-08-06 DIAGNOSIS — G8929 Other chronic pain: Secondary | ICD-10-CM

## 2019-08-06 DIAGNOSIS — Z9889 Other specified postprocedural states: Secondary | ICD-10-CM

## 2019-08-06 DIAGNOSIS — M5136 Other intervertebral disc degeneration, lumbar region: Secondary | ICD-10-CM

## 2019-08-06 DIAGNOSIS — M5090 Cervical disc disorder, unspecified, unspecified cervical region: Secondary | ICD-10-CM

## 2019-08-06 NOTE — Telephone Encounter (Signed)
Patient has appointment tomorrow

## 2019-08-07 ENCOUNTER — Ambulatory Visit (INDEPENDENT_AMBULATORY_CARE_PROVIDER_SITE_OTHER): Payer: PPO | Admitting: Family Medicine

## 2019-08-07 ENCOUNTER — Encounter: Payer: Self-pay | Admitting: Family Medicine

## 2019-08-07 ENCOUNTER — Telehealth: Payer: Self-pay

## 2019-08-07 DIAGNOSIS — M5090 Cervical disc disorder, unspecified, unspecified cervical region: Secondary | ICD-10-CM

## 2019-08-07 DIAGNOSIS — M546 Pain in thoracic spine: Secondary | ICD-10-CM | POA: Diagnosis not present

## 2019-08-07 DIAGNOSIS — M5136 Other intervertebral disc degeneration, lumbar region: Secondary | ICD-10-CM

## 2019-08-07 DIAGNOSIS — M5134 Other intervertebral disc degeneration, thoracic region: Secondary | ICD-10-CM | POA: Diagnosis not present

## 2019-08-07 DIAGNOSIS — G8929 Other chronic pain: Secondary | ICD-10-CM | POA: Diagnosis not present

## 2019-08-07 DIAGNOSIS — Z9889 Other specified postprocedural states: Secondary | ICD-10-CM | POA: Diagnosis not present

## 2019-08-07 MED ORDER — GABAPENTIN 600 MG PO TABS: 600.0000 mg | ORAL_TABLET | Freq: Three times a day (TID) | ORAL | 1 refills | Status: DC

## 2019-08-07 MED ORDER — OXYCODONE-ACETAMINOPHEN 10-325 MG PO TABS: 1.0000 | ORAL_TABLET | Freq: Three times a day (TID) | ORAL | 0 refills | Status: DC | PRN

## 2019-08-07 MED ORDER — BACLOFEN 10 MG PO TABS: 10.0000 mg | ORAL_TABLET | Freq: Three times a day (TID) | ORAL | 5 refills | Status: DC

## 2019-08-07 NOTE — Progress Notes (Signed)
Telephone visit completed today in accordance to COVID-19 precautions.  Patient does not have access to smart phone or Internet  I connected with [name] on 08/07/19 at  1:30 PM EST by a telephone visit and verified that I am speaking with the correct person using two identifiers. Location patient: Home Location provider: [facility], Office Persons participating in the virtual visit: Patient, Dr. Raoul Pitch and R.Baker, LPN  I discussed the limitations of evaluation and management by telemedicine and the availability of in person appointments. The patient expressed understanding and agreed to proceed.   Patient Care Team    Relationship Specialty Notifications Start End  [name], DO PCP - General Family Medicine  10/31/15   [name], MD (Inactive) Consulting Physician Gastroenterology  04/24/16     SUBJECTIVE Chief Complaint  Patient presents with  . Back Pain    Pt is set up to get injections December 14th and needs pain relief until this appt. He was given Voltaren gel by Neurologist. Cyndra Numbers told patient not to take Gabapentin.     HPI:  Cervical neck pain with evidence of disc disease/Chronic midline thoracic back pain/Thoracic degenerative disc disease/DDD (degenerative disc disease), lumbar/History of lumbar discectomy Patient presents today to discuss his pain.  He has been under the care of neurology which most recently started him on Voltaren.  He is  prescribed Mobic by another provider and has been taking both.  He is currently in not taking a muscle relaxer, he says he thought the meloxicam was a muscle relaxer.  He has been on Flexeril in the past that caused him to be sleepy.  He was prescribed baclofen, but reports that ran out a few months ago.  He was prescribed gabapentin 600 mg 3 times daily that it was least helping some with his pain.  However he reports that one of the providers told him not to take it because it was not good for him.   Patient does have normal kidney function.  He reports today he has pain that is rather severe in his neck, shoulder and in between his shoulder blades.  He has injections scheduled on December 14, and he states he cannot take this pain for another 4 weeks.  Patient was provided with a Percocet prescription back in April, which did help with his discomfort.  Last oxycodone prescription written 01/15/2019 for 90 tablets.  Prior note: Lower cervical to mid thoracic pain still present. Using his right arm exacerbates his pain midline of thoracic.  He has a history thoracic spine degeneration and discectomy in his lumbar spine.  Has degenerative changes, bone spurring and facet arthropathy in his lumbar spine by x-rays obtained 11/2018.Cervical spine x-ray with mild bilateral foraminal narrowing at C3-C4 11/2018. Unfortunately he was reported as allergic or intolerant to many of the medications that would be helpful for him such as gabapentin, Lyrica, Celebrex and Cymbalta --> we discussed this in more detail last visit and he believes that the "allergy-hallucinations " was secondary to having multiple of these medicines on board at the same time back in 2014. We tried to start gabapentin and it did not have any side effects from medication. It has not been extremely helpful  Yet at lower doses. He admits to rather sigmnificant stomach upset starting from the naproxen BID use ober the last 8 week. Advanced imaging has not bee able to be completed 2/2 to covid outbreak. He desired to wait on referral to  specialist last visit in hope that he would receive benefit from the gabapentin.    ROS: See pertinent positives and negatives per HPI.  Patient Active Problem List   Diagnosis Date Noted  . GERD (gastroesophageal reflux disease) 06/23/2019  . Cervical neck pain with evidence of disc disease 01/15/2019  . Chronic midline thoracic back pain 01/15/2019  . Thoracic degenerative disc disease 01/15/2019  .  History of lumbar discectomy 01/15/2019  . DDD (degenerative disc disease), lumbar 01/15/2019  . OSA on CPAP 07/04/2018  . Overweight (BMI 25.0-29.9) 05/01/2018  . Benign prostatic hyperplasia with lower urinary tract symptoms 11/15/2017  . Anxiety 09/12/2017  . Stopped smoking with greater than 30 pack year history 07/30/2017  . Pulmonary lesion 05/16/2017  . History of smoking 30 or more pack years 04/24/2016  . Alcohol ingestion of more than 4 drinks/week 09/10/2014  . Elevated hemoglobin A1c 02/19/2014  . Dyslipidemia 02/19/2014  . Essential hypertension, benign 02/19/2014  . History of colonic polyps 09/23/2009  . GERD 12/12/2007    Social History   Tobacco Use  . Smoking status: Former Smoker    Packs/day: 3.00    Years: 30.00    Pack years: 90.00    Quit date: 07/16/2002    Years since quitting: 17.0  . Smokeless tobacco: Never Used  Substance Use Topics  . Alcohol use: Yes    Alcohol/week: 1.0 - 2.0 standard drinks    Types: 1 - 2 Cans of beer per week    Comment: socially    Current Outpatient Medications:  .  amLODipine (NORVASC) 10 MG tablet, Take 1 tablet (10 mg total) by mouth daily., Disp: 90 tablet, Rfl: 1 .  aspirin 81 MG tablet, Take 81 mg by mouth daily., Disp: , Rfl:  .  escitalopram (LEXAPRO) 20 MG tablet, Take 1 tablet (20 mg total) by mouth daily., Disp: 90 tablet, Rfl: 1 .  hydrochlorothiazide (HYDRODIURIL) 25 MG tablet, Take 1 tablet (25 mg total) by mouth daily., Disp: 90 tablet, Rfl: 1 .  MegaRed Omega-3 Krill Oil 500 MG CAPS, Take 2 capsules by mouth daily. , Disp: , Rfl:  .  meloxicam (MOBIC) 15 MG tablet, Take 1 tablet (15 mg total) by mouth daily. (Patient taking differently: Take 15 mg by mouth at bedtime. ), Disp: 30 tablet, Rfl: 11 .  pantoprazole (PROTONIX) 40 MG tablet, Take 1 tablet (40 mg total) by mouth daily., Disp: 90 tablet, Rfl: 3 .  simvastatin (ZOCOR) 10 MG tablet, TAKE ONE TABLET BY MOUTH EVERY OTHER DAY, Disp: 15 tablet, Rfl: 11  .  baclofen (LIORESAL) 10 MG tablet, Take 1 tablet (10 mg total) by mouth 3 (three) times daily., Disp: 90 tablet, Rfl: 5 .  gabapentin (NEURONTIN) 600 MG tablet, Take 1 tablet (600 mg total) by mouth 3 (three) times daily., Disp: 270 tablet, Rfl: 1 .  oxyCODONE-acetaminophen (PERCOCET) 10-325 MG tablet, Take 1 tablet by mouth every 8 (eight) hours as needed for pain., Disp: 90 tablet, Rfl: 0  Allergies  Allergen Reactions  . Celebrex [Celecoxib]   . Cymbalta [Duloxetine Hcl]   . Lyrica [Pregabalin]   . Penicillins     REACTION: hives    OBJECTIVE: Ht 6\' 2"  (1.88 m)   BMI 29.96 kg/m  Gen: No acute distress. Nontoxic Chest: Cough or shortness of breath not present Neuro: Alert. Oriented x3  Psych: Normal affect, dress and demeanor. Normal speech. Normal thought content and judgment.  ASSESSMENT AND PLAN: JESSIE GOETSCHIUS is a 68 y.o.  male present for  Cervical neck pain with evidence of disc disease/Chronic midline thoracic back pain/Thoracic degenerative disc disease/DDD (degenerative disc disease), lumbar/History of lumbar discectomy - persistent pain- mostly now focused mid thoracic area- worse with use of right arm. Other locations have discomfort have improved with medication treatment in the past, however his medication treatment has changed over the last few months with his specialist. -Advised him to only use either Mobic or Voltaren, and these are not to be used together.  In the past he has not felt diclofenac was helpful. - Unfortunately he is allergic or intolerant, to many of the medications that would be helpful for him such as  Lyrica, Celebrex and Cymbalta --> we discussed this in more detail today and he believes that the "allergy-hallucinations "secondary to having multiple of these medicines on board at the same time back in 2014.   -Restart gabapentin taper to 600 mg 3 times daily, this was explained to him today.  And prescription refilled. -Restart baclofen 10 mg 3  times daily, prescription refilled. -There is not much more we can offer him in the way of pain control.  Advised him if he is still having increased in pain after restarting the above medications,, we would need to refer him to a pain management clinic.  Patient reports understanding.   > 25 minutes spent directly with patient  Howard Pouch, DO 08/07/2019

## 2019-08-07 NOTE — Telephone Encounter (Signed)
Started and sent a PA through [employer].    Key: [id number]  PA Case ID: [id number]  Rx #: [id number]    Medication: Oxycodone Acetaminophen    Will check the status in 24-48 hours.

## 2019-08-10 ENCOUNTER — Other Ambulatory Visit: Payer: Self-pay

## 2019-08-11 DIAGNOSIS — H2513 Age-related nuclear cataract, bilateral: Secondary | ICD-10-CM | POA: Diagnosis not present

## 2019-08-18 DIAGNOSIS — G4733 Obstructive sleep apnea (adult) (pediatric): Secondary | ICD-10-CM | POA: Diagnosis not present

## 2019-08-31 DIAGNOSIS — M5412 Radiculopathy, cervical region: Secondary | ICD-10-CM | POA: Diagnosis not present

## 2019-08-31 DIAGNOSIS — G5601 Carpal tunnel syndrome, right upper limb: Secondary | ICD-10-CM | POA: Diagnosis not present

## 2019-09-01 ENCOUNTER — Telehealth: Payer: Self-pay | Admitting: Family Medicine

## 2019-09-01 NOTE — Telephone Encounter (Signed)
Pt was called and states he received shots in his arm and neck yesterday he was told it will take about 5-7 days to work fully and he may need it again in 3 months. Pt is asking if he should continue with pain medication Dr Raoul Pitch RX him. He states he takes the Baclofen TID, Mobic once daily, and the Percocet he takes only every once in a while when pain is really bad.  Pt states pain is still bad and shots have not worked yet.    Please advise.

## 2019-09-01 NOTE — Telephone Encounter (Signed)
Patient requesting to speak with Nicholas Graham regarding his meds.  He has several questions, he would not go into detail with me.  Please call patient at [phone].  Thank you

## 2019-09-01 NOTE — Telephone Encounter (Signed)
I would advise him to continue the medications prescribed until he notices relief.

## 2019-09-02 NOTE — Telephone Encounter (Signed)
Pt was called and given information.  

## 2019-09-15 ENCOUNTER — Other Ambulatory Visit: Payer: Self-pay | Admitting: Family Medicine

## 2019-09-29 DIAGNOSIS — M5412 Radiculopathy, cervical region: Secondary | ICD-10-CM | POA: Diagnosis not present

## 2019-09-29 DIAGNOSIS — I10 Essential (primary) hypertension: Secondary | ICD-10-CM | POA: Diagnosis not present

## 2019-09-29 DIAGNOSIS — M542 Cervicalgia: Secondary | ICD-10-CM | POA: Diagnosis not present

## 2019-09-29 DIAGNOSIS — Z6830 Body mass index (BMI) 30.0-30.9, adult: Secondary | ICD-10-CM | POA: Diagnosis not present

## 2019-09-30 ENCOUNTER — Telehealth: Payer: Self-pay

## 2019-09-30 NOTE — Telephone Encounter (Signed)
Pt went to MD that gives him shots in his back yesterday and they said there is nothing else can be done but getting shots q89months and can enroll in pain management program. Pt states shots only give him relief for about 6 days and then they stop working. Pt is frustrated because he has been to several different doctors and they want him on all this pain medication but will not do any kind of surgery. Pt does not want to drive to [facility] monthly to get the medications he is currently on. He is asking if Dr Raoul Pitch will continue to write RX's for his current medications and he can come here to do drug testing. [place] much longer drive for him. Please advise

## 2019-10-01 NOTE — Telephone Encounter (Addendum)
Please inform patient: I would be happy to help him if I can.  There are certain medications that primary care prescribes, and some that are prescribed by pain management specialist only.  Therefore I would need him to confirm his current regimen he is requesting [place] to overtake. His current medication list reflects Percocet as the pain medication he is on.  Please confirm his pain medication regimen.  If Percocet is the medication he is wanting me to take over we can do that for him, if it is another medication I will need to review prior to agreeing. His pain management visits here must be separate from other visit types, meaning we can not cover pain management when performing his physical/preventative visit yearly or cover new problems same time as pain management appointments.   - Pain management appointments would occur every 3 months routinely.  If he is agreeable to the above in the regimen is correct on his med list, please set him up for his first appointment at his earliest convenience.

## 2019-10-02 NOTE — Telephone Encounter (Signed)
Pt was called and wife answered the phone. She stated he was out in the yard and could take message. Wife on DPR and was given information, she verbalized understanding

## 2019-10-14 ENCOUNTER — Encounter: Payer: Self-pay | Admitting: Family Medicine

## 2019-10-14 ENCOUNTER — Ambulatory Visit (INDEPENDENT_AMBULATORY_CARE_PROVIDER_SITE_OTHER): Payer: PPO | Admitting: Family Medicine

## 2019-10-14 ENCOUNTER — Other Ambulatory Visit: Payer: Self-pay

## 2019-10-14 VITALS — BP 134/78 | HR 77 | Temp 98.0°F | Resp 17 | Ht 74.0 in | Wt 233.0 lb

## 2019-10-14 DIAGNOSIS — Z9889 Other specified postprocedural states: Secondary | ICD-10-CM

## 2019-10-14 DIAGNOSIS — M546 Pain in thoracic spine: Secondary | ICD-10-CM

## 2019-10-14 DIAGNOSIS — M5136 Other intervertebral disc degeneration, lumbar region: Secondary | ICD-10-CM | POA: Diagnosis not present

## 2019-10-14 DIAGNOSIS — Z0289 Encounter for other administrative examinations: Secondary | ICD-10-CM

## 2019-10-14 DIAGNOSIS — M5134 Other intervertebral disc degeneration, thoracic region: Secondary | ICD-10-CM

## 2019-10-14 DIAGNOSIS — M5090 Cervical disc disorder, unspecified, unspecified cervical region: Secondary | ICD-10-CM | POA: Diagnosis not present

## 2019-10-14 DIAGNOSIS — G8929 Other chronic pain: Secondary | ICD-10-CM | POA: Diagnosis not present

## 2019-10-14 DIAGNOSIS — R52 Pain, unspecified: Secondary | ICD-10-CM

## 2019-10-14 MED ORDER — MELOXICAM 15 MG PO TABS: 15.0000 mg | ORAL_TABLET | Freq: Every day | ORAL | 3 refills | Status: DC

## 2019-10-14 MED ORDER — OXYCODONE-ACETAMINOPHEN 10-325 MG PO TABS: 1.0000 | ORAL_TABLET | Freq: Two times a day (BID) | ORAL | 0 refills | Status: DC

## 2019-10-14 MED ORDER — GABAPENTIN 600 MG PO TABS: 600.0000 mg | ORAL_TABLET | Freq: Three times a day (TID) | ORAL | 1 refills | Status: DC

## 2019-10-14 MED ORDER — BACLOFEN 20 MG PO TABS: 10.0000 mg | ORAL_TABLET | Freq: Three times a day (TID) | ORAL | 1 refills | Status: DC

## 2019-10-14 NOTE — Patient Instructions (Addendum)
Follow up every 3 months for chronic pain management if needing refills.  These appts need to be separate from other appointments.   Baclofen I increased the dose of the tab- you can take 10-20 mg,  Every 8 hours. Try to use the least amount, but if needed could increase one of the doses to 20 mg.   I refilled your pain med today- there will be 2 scripts on hold for you at the pharmacy- Must talk to a person at pharmacy to get the refill.   COVID-19 Vaccine Information can be found at: ShippingScam.co.uk For questions related to vaccine distribution or appointments, please email [email or web address] or call [phone].  Covid Vaccine appointment go to [email or web address].

## 2019-10-14 NOTE — Progress Notes (Addendum)
Patient Care Team    Relationship Specialty Notifications Start End  [name], DO PCP - General Family Medicine  10/31/15   [name], MD (Inactive) Consulting Physician Gastroenterology  04/24/16     SUBJECTIVE Chief Complaint  Patient presents with  . Back Pain    Pt is here to get refills on his pain medications.     HPI:  Cervical neck pain with evidence of disc disease/Chronic midline thoracic back pain/Thoracic degenerative disc disease/DDD (degenerative disc disease), lumbar/History of lumbar discectomy Pt presents today to start chronic pain management treatment for his cervical and lumbar conditions. His discomfort has progressed over the last year. He has been seen by neuro and pain rehab. He has received injections- that have only provided a few days of temporary relief. He is frustrated, but has appropriate expectations that he will be living with pain. He is hoping to be bale to maintain some quality of life w/ chronic pain prescription.  Prior note: Patient presents today to discuss his pain.  He has been under the care of neurology which most recently started him on Voltaren.  He is  prescribed Mobic by another provider and has been taking both.  He is currently in not taking a muscle relaxer, he says he thought the meloxicam was a muscle relaxer.  He has been on Flexeril in the past that caused him to be sleepy.  He was prescribed baclofen, but reports that ran out a few months ago.  He was prescribed gabapentin 600 mg 3 times daily that it was least helping some with his pain.  However he reports that one of the providers told him not to take it because it was not good for him.  Patient does have normal kidney function.  He reports today he has pain that is rather severe in his neck, shoulder and in between his shoulder blades.  He has injections scheduled on December 14, and he states he cannot take this pain for another 4 weeks.  Patient was provided with a  Percocet prescription back in April, which did help with his discomfort.    Prior note: Lower cervical to mid thoracic pain still present. Using his right arm exacerbates his pain midline of thoracic.  He has a history thoracic spine degeneration and discectomy in his lumbar spine.  Has degenerative changes, bone spurring and facet arthropathy in his lumbar spine by x-rays obtained 11/2018.Cervical spine x-ray with mild bilateral foraminal narrowing at C3-C4 11/2018. Unfortunately he was reported as allergic or intolerant to many of the medications that would be helpful for him such as gabapentin, Lyrica, Celebrex and Cymbalta --> we discussed this in more detail last visit and he believes that the "allergy-hallucinations " was secondary to having multiple of these medicines on board at the same time back in 2014. We tried to start gabapentin and it did not have any side effects from medication. It has not been extremely helpful  Yet at lower doses. He admits to rather sigmnificant stomach upset starting from the naproxen BID use ober the last 8 week. Advanced imaging has not bee able to be completed 2/2 to covid outbreak. He desired to wait on referral to specialist last visit in hope that he would receive benefit from the gabapentin.    ROS: See pertinent positives and negatives per HPI.  Patient Active Problem List   Diagnosis Date Noted  . GERD (gastroesophageal reflux disease) 06/23/2019  . Cervical neck pain with evidence  of disc disease 01/15/2019  . Chronic midline thoracic back pain 01/15/2019  . Thoracic degenerative disc disease 01/15/2019  . History of lumbar discectomy 01/15/2019  . DDD (degenerative disc disease), lumbar 01/15/2019  . OSA on CPAP 07/04/2018  . Overweight (BMI 25.0-29.9) 05/01/2018  . Benign prostatic hyperplasia with lower urinary tract symptoms 11/15/2017  . Anxiety 09/12/2017  . Stopped smoking with greater than 30 pack year history 07/30/2017  . Pulmonary  lesion 05/16/2017  . History of smoking 30 or more pack years 04/24/2016  . Alcohol ingestion of more than 4 drinks/week 09/10/2014  . Elevated hemoglobin A1c 02/19/2014  . Dyslipidemia 02/19/2014  . Essential hypertension, benign 02/19/2014  . History of colonic polyps 09/23/2009  . GERD 12/12/2007    Social History   Tobacco Use  . Smoking status: Former Smoker    Packs/day: 3.00    Years: 30.00    Pack years: 90.00    Quit date: 07/16/2002    Years since quitting: 17.2  . Smokeless tobacco: Never Used  Substance Use Topics  . Alcohol use: Yes    Alcohol/week: 1.0 - 2.0 standard drinks    Types: 1 - 2 Cans of beer per week    Comment: socially    Current Outpatient Medications:  .  amLODipine (NORVASC) 10 MG tablet, Take 1 tablet (10 mg total) by mouth daily., Disp: 90 tablet, Rfl: 1 .  aspirin 81 MG tablet, Take 81 mg by mouth daily., Disp: , Rfl:  .  baclofen (LIORESAL) 10 MG tablet, Take 1 tablet (10 mg total) by mouth 3 (three) times daily., Disp: 90 tablet, Rfl: 5 .  cyclobenzaprine (FLEXERIL) 10 MG tablet, Take 10 mg by mouth at bedtime., Disp: , Rfl:  .  escitalopram (LEXAPRO) 20 MG tablet, Take 1 tablet (20 mg total) by mouth daily., Disp: 90 tablet, Rfl: 1 .  gabapentin (NEURONTIN) 600 MG tablet, Take 1 tablet (600 mg total) by mouth 3 (three) times daily., Disp: 270 tablet, Rfl: 1 .  hydrochlorothiazide (HYDRODIURIL) 25 MG tablet, Take 1 tablet (25 mg total) by mouth daily., Disp: 90 tablet, Rfl: 1 .  MegaRed Omega-3 Krill Oil 500 MG CAPS, Take 2 capsules by mouth daily. , Disp: , Rfl:  .  meloxicam (MOBIC) 15 MG tablet, Take 1 tablet (15 mg total) by mouth daily. (Patient taking differently: Take 15 mg by mouth at bedtime. ), Disp: 30 tablet, Rfl: 11 .  pantoprazole (PROTONIX) 40 MG tablet, Take 1 tablet (40 mg total) by mouth daily., Disp: 90 tablet, Rfl: 3 .  simvastatin (ZOCOR) 10 MG tablet, TAKE ONE TABLET BY MOUTH EVERY OTHER DAY, Disp: 15 tablet, Rfl: 11 .   diclofenac (VOLTAREN) 75 MG EC tablet, Take 75 mg by mouth 2 (two) times daily., Disp: , Rfl:  .  oxyCODONE-acetaminophen (PERCOCET) 10-325 MG tablet, Take 1 tablet by mouth every 8 (eight) hours as needed for pain., Disp: 90 tablet, Rfl: 0  Allergies  Allergen Reactions  . Celebrex [Celecoxib]   . Cymbalta [Duloxetine Hcl]   . Lyrica [Pregabalin]   . Penicillins     REACTION: hives    OBJECTIVE: BP 134/78 (BP Location: Left Arm, Patient Position: Sitting, Cuff Size: Normal)   Pulse 77   Temp 98 F (36.7 C) (Temporal)   Resp 17   Ht 6\' 2"  (1.88 m)   Wt 233 lb (105.7 kg)   SpO2 98%   BMI 29.92 kg/m   Gen: Afebrile. No acute distress.  HENT: AT. [place].  CV: RRR  MSK: no erythema, TTP C7, ropiness present bilateral cervical and lumbar  Paraspinal. Tingling along right forearm. +tinels. NV intact.  Neuro: Normal gait. PERLA. EOMi. Alert. Oriented x3  Psych: Normal affect, dress and demeanor. Normal speech. Normal thought content and judgment.   ASSESSMENT AND PLAN: Nicholas Graham is a 69 y.o. male present for  Cervical neck pain with evidence of disc disease/Chronic midline thoracic back pain/Thoracic degenerative disc disease/DDD (degenerative disc disease), lumbar/History of lumbar discectomy - persistent pain despite treatments though pain rehab. Agreed to over take pain management. Pt counseled on need of face to face encounter every 3 months for pain management alone.  - Pain contract signed to day.  - UDS ordered today- pt counseled UDS will be collected at LEAST once a year, maybe more.  -Continue Mobic  -continue gabapentin taper to 600 mg 3 times daily -Continue baclofen 10-20 mg 3 times daily- increased dose today, he is aware to use least amount.  - percocet 10-325 mg BID #60 prescribed with 2 refills (held) at pharmacy.  - f/u 3 mos   Orders Placed This Encounter  Procedures  . Pain Mgmt, Profile 8 w/Conf, U   Meds ordered this encounter  Medications  .  meloxicam (MOBIC) 15 MG tablet    Sig: Take 1 tablet (15 mg total) by mouth at bedtime.    Dispense:  90 tablet    Refill:  3  . gabapentin (NEURONTIN) 600 MG tablet    Sig: Take 1 tablet (600 mg total) by mouth 3 (three) times daily.    Dispense:  270 tablet    Refill:  1    Pt reports not true allergy- was a combination of meds that caused symptoms.  . baclofen (LIORESAL) 20 MG tablet    Sig: Take 0.5-1 tablets (10-20 mg total) by mouth 3 (three) times daily.    Dispense:  270 tablet    Refill:  1  . DISCONTD: oxyCODONE-acetaminophen (PERCOCET) 10-325 MG tablet    Sig: Take 1 tablet by mouth every 12 (twelve) hours.    Dispense:  60 tablet    Refill:  0  . DISCONTD: oxyCODONE-acetaminophen (PERCOCET) 10-325 MG tablet    Sig: Take 1 tablet by mouth every 12 (twelve) hours.    Dispense:  60 tablet    Refill:  0    Hold- May refill after 11/10/2019  . oxyCODONE-acetaminophen (PERCOCET) 10-325 MG tablet    Sig: Take 1 tablet by mouth every 12 (twelve) hours.    Dispense:  60 tablet    Refill:  0    Hold- May refill after 12/05/2019      Howard Pouch, DO 10/14/2019

## 2019-10-15 ENCOUNTER — Telehealth: Payer: Self-pay

## 2019-10-15 NOTE — Telephone Encounter (Signed)
Faxed approval received. Faxed to pharmacy and sent for scan

## 2019-10-15 NOTE — Telephone Encounter (Signed)
Prior Auth completed using [email or web address] for pts Percocet. Trilby Leaver Key: [id number] - [place] Case ID: [id number]

## 2019-10-16 LAB — PAIN MGMT, PROFILE 8 W/CONF, U
6 Acetylmorphine: NEGATIVE ng/mL
Alcohol Metabolites: NEGATIVE ng/mL (ref <500)
Amphetamines: NEGATIVE ng/mL
Benzodiazepines: NEGATIVE ng/mL
Buprenorphine, Urine: NEGATIVE ng/mL
Cocaine Metabolite: NEGATIVE ng/mL
Creatinine: 78.2 mg/dL
MDMA: NEGATIVE ng/mL
Marijuana Metabolite: NEGATIVE ng/mL
Noroxycodone: 210 ng/mL
Opiates: NEGATIVE ng/mL
Oxidant: NEGATIVE ug/mL
Oxycodone: NEGATIVE ng/mL
Oxycodone: POSITIVE ng/mL
Oxymorphone: 119 ng/mL
pH: 7.2 (ref 4.5–9.0)

## 2019-12-10 ENCOUNTER — Other Ambulatory Visit: Payer: Self-pay | Admitting: Family Medicine

## 2019-12-10 DIAGNOSIS — I10 Essential (primary) hypertension: Secondary | ICD-10-CM

## 2019-12-16 ENCOUNTER — Other Ambulatory Visit: Payer: Self-pay

## 2019-12-16 ENCOUNTER — Ambulatory Visit (INDEPENDENT_AMBULATORY_CARE_PROVIDER_SITE_OTHER): Payer: PPO | Admitting: Family Medicine

## 2019-12-16 ENCOUNTER — Encounter: Payer: Self-pay | Admitting: Family Medicine

## 2019-12-16 ENCOUNTER — Telehealth: Payer: Self-pay

## 2019-12-16 VITALS — BP 126/84 | HR 83 | Temp 98.2°F | Resp 18 | Ht 74.0 in | Wt 230.5 lb

## 2019-12-16 DIAGNOSIS — R7309 Other abnormal glucose: Secondary | ICD-10-CM

## 2019-12-16 DIAGNOSIS — E663 Overweight: Secondary | ICD-10-CM | POA: Diagnosis not present

## 2019-12-16 DIAGNOSIS — E785 Hyperlipidemia, unspecified: Secondary | ICD-10-CM

## 2019-12-16 DIAGNOSIS — I10 Essential (primary) hypertension: Secondary | ICD-10-CM | POA: Diagnosis not present

## 2019-12-16 DIAGNOSIS — K2100 Gastro-esophageal reflux disease with esophagitis, without bleeding: Secondary | ICD-10-CM

## 2019-12-16 DIAGNOSIS — Z9989 Dependence on other enabling machines and devices: Secondary | ICD-10-CM | POA: Diagnosis not present

## 2019-12-16 DIAGNOSIS — G4733 Obstructive sleep apnea (adult) (pediatric): Secondary | ICD-10-CM

## 2019-12-16 DIAGNOSIS — F419 Anxiety disorder, unspecified: Secondary | ICD-10-CM

## 2019-12-16 MED ORDER — AMLODIPINE BESYLATE 10 MG PO TABS: 10.0000 mg | ORAL_TABLET | Freq: Every day | ORAL | 3 refills | Status: DC

## 2019-12-16 MED ORDER — ESCITALOPRAM OXALATE 20 MG PO TABS: 20.0000 mg | ORAL_TABLET | Freq: Every day | ORAL | 3 refills | Status: DC

## 2019-12-16 MED ORDER — HYDROCHLOROTHIAZIDE 25 MG PO TABS: 25.0000 mg | ORAL_TABLET | Freq: Every day | ORAL | 3 refills | Status: DC

## 2019-12-16 MED ORDER — LISINOPRIL 10 MG PO TABS: 10.0000 mg | ORAL_TABLET | Freq: Every day | ORAL | 1 refills | Status: DC

## 2019-12-16 NOTE — Telephone Encounter (Signed)
Please requesting a call back about Lisinopril

## 2019-12-16 NOTE — Addendum Note (Signed)
Addended by: Howard Pouch A on: 12/16/2019 12:25 PM   Modules accepted: Orders

## 2019-12-16 NOTE — Telephone Encounter (Signed)
Pt was called and all meds and doses were confirmed. Pt verbalized understanding.

## 2019-12-16 NOTE — Patient Instructions (Signed)
remember to make your appt end of April for your chronic pain. Those appointments are separate from your chronic conditions and physicals.   Next chronic condition appt and physical due first week in October. We will collect all your labs at this time.   Happy Nicholas Graham!!!

## 2019-12-16 NOTE — Telephone Encounter (Signed)
I returned pts call and he has been taking Lisinopril 10 mg tablet daily since December (has 90 day bottle with 4 pills left) along with HCTZ, and Amlodipine daily. Pt wants to know if he should continue with all three medications or not. He states he has gotten confused somewhere along the line and thought he needed all three meds.

## 2019-12-16 NOTE — Progress Notes (Addendum)
Nicholas Graham , 10/23/1950, 69 y.o., male MRN: [id number] Patient Care Team    Relationship Specialty Notifications Start End  [name], DO PCP - General Family Medicine  10/31/15   [name], MD (Inactive) Consulting Physician Gastroenterology  04/24/16     Chief Complaint  Patient presents with  . Hypertension    Needs refills   . Anxiety     Subjective: Pt presents for an OV for [facility]  Hypertension/HLD/overweight: Pt reports compliancewith amlodipine 10 mg daily, lisinopril 10 mg QD and HCTZ. .Patient denies chest pain, shortness of breath, dizziness or lower extremity edema.  Pt takes a daily baby ASA. Pt is prescribed statin.  Labs UTD Diet: Low-sodium Exercise: Not exercising as routinely. RF: Hypertension, hyperlipidemia, family history of heart disease. Overweight.  ANXIETY:  Doing well with use of lexapro 20  Mg qd. 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  Gerd:still using prn and doing well.     Depression screen [employer] 2/9 12/16/2019 06/23/2019 06/19/2018 12/13/2017 11/15/2017  Decreased Interest 0 0 0 0 0  Down, Depressed, Hopeless 0 0 0 0 0  PHQ - 2 Score 0 0 0 0 0  Altered sleeping - - - - 3  Tired, decreased energy - - - - 0  Change in appetite - - - - 0  Feeling bad or failure about yourself  - - - - 0  Trouble concentrating - - - - 0  Moving slowly or fidgety/restless - - - - 0  Suicidal thoughts - - - - 0  PHQ-9 Score - - - - 3  Difficult doing work/chores - - - - Not difficult at all   GAD 7 : Generalized Anxiety Score 12/16/2019 06/23/2019 11/15/2017 10/04/2017  Nervous, Anxious, on Edge 0 0 0 0  Control/stop worrying 0 0 0 0  Worry too much - different things 0 0 0 0  Trouble relaxing 0 0 0 1  Restless 3 1 1 1   Easily annoyed or irritable 0 0 0 0  Afraid - awful might happen 0 0 0 0  Total GAD 7 Score 3 1 1 2   Anxiety Difficulty Somewhat difficult Not difficult at all Not difficult at all Not difficult at all     Allergies  Allergen Reactions  . Celebrex [Celecoxib]   . Cymbalta [Duloxetine Hcl]   . Lyrica [Pregabalin]   . Penicillins     REACTION: hives   Social History   Social History Narrative   Married, Arts administrator. Children (2) Adult, 4 grandchildren.    9 th grade, Retired.   Wears seatbelt   Smoke detector in the home.    Firearms locked in the home.    Feels safe in his relationships.    Past Medical History:  Diagnosis Date  . Arm fracture    left. Dx'd w/RSD post fracture  . Carpal tunnel syndrome   . Chest pain 2016   [facility] (Dr. Matthew Saras) doing stress echo, but feels like musculoskeletal chest wall pain is cause of his discomfort  . Chronic back pain   . Deviated septum   . Diverticulosis   . GERD (gastroesophageal reflux disease)   . Hyperlipidemia   . Hypertension   . Joint pain   . Muscle pain   . Nephrolithiasis   . OSA on CPAP 07/04/2018   - Trial of CPAP therapy on 17 cm H2O with a Large size [employer] Full Face Mask Simplus mask  and heated humidification.  . Plantar fasciitis   . Pulmonary nodule 04/2016   [place].  Pt chose to repeat CT chest w/out contrast in 3 mo---as per radiologist's recommendations.  . Right inguinal hernia    fat-containing.  Small hydrocele in left hemiscrotum.  . Snoring   . Swelling    of legs/feet/hands  . Tobacco dependence in remission    Past Surgical History:  Procedure Laterality Date  . COLONOSCOPY    . fracture repair left arm     had a fixator  . INGUINAL HERNIA REPAIR Left 2013   repeat hernia repair. x2  . KNEE ARTHROSCOPY Right   . [place] SURGERY  1993  . NEURECTOMY inguinal after hernia repair Left   . PLANTAR FASCIA SURGERY Right 2000s  . UPPER GI ENDOSCOPY     Family History  Problem Relation Age of Onset  . Lung cancer Mother   . Lung cancer Father   . Bone cancer Brother        bone  . Heart disease Maternal Uncle   . Colon cancer Neg Hx    Allergies as of 12/16/2019       Reactions   Celebrex [celecoxib]    Cymbalta [duloxetine Hcl]    Lyrica [pregabalin]    Penicillins    REACTION: hives      Medication List       Accurate as of December 16, 2019 12:25 PM. If you have any questions, ask your nurse or doctor.        STOP taking these medications   meloxicam 15 MG tablet Commonly known as: MOBIC Stopped by: Nicholas Pouch, DO     TAKE these medications   amLODipine 10 MG tablet Commonly known as: NORVASC Take 1 tablet (10 mg total) by mouth daily.   aspirin 81 MG tablet Take 81 mg by mouth daily.   baclofen 20 MG tablet Commonly known as: LIORESAL Take 0.5-1 tablets (10-20 mg total) by mouth 3 (three) times daily.   cyclobenzaprine 10 MG tablet Commonly known as: FLEXERIL Take 10 mg by mouth at bedtime.   escitalopram 20 MG tablet Commonly known as: LEXAPRO Take 1 tablet (20 mg total) by mouth daily.   gabapentin 600 MG tablet Commonly known as: NEURONTIN Take 1 tablet (600 mg total) by mouth 3 (three) times daily.   hydrochlorothiazide 25 MG tablet Commonly known as: HYDRODIURIL Take 1 tablet (25 mg total) by mouth daily.   lisinopril 10 MG tablet Commonly known as: ZESTRIL Take 1 tablet (10 mg total) by mouth daily. Started by: Nicholas Pouch, DO   MegaRed Omega-3 Krill Oil 500 MG Caps Take 2 capsules by mouth daily.   oxyCODONE-acetaminophen 10-325 MG tablet Commonly known as: PERCOCET Take 1 tablet by mouth every 12 (twelve) hours.   pantoprazole 40 MG tablet Commonly known as: PROTONIX Take 1 tablet (40 mg total) by mouth daily.   simvastatin 10 MG tablet Commonly known as: ZOCOR TAKE ONE TABLET BY MOUTH EVERY OTHER DAY       All past medical history, surgical history, allergies, family history, immunizations andmedications were updated in the EMR today and reviewed under the history and medication portions of their EMR.     ROS: Negative, with the exception of above mentioned in HPI   Objective:  BP 126/84  (BP Location: Left Arm, Patient Position: Sitting, Cuff Size: Normal)   Pulse 83   Temp 98.2 F (36.8 C) (Temporal)   Resp 18   Ht  6\' 2"  (1.88 m)   Wt 230 lb 8 oz (104.6 kg)   SpO2 98%   BMI 29.59 kg/m  Body mass index is 29.59 kg/m. Gen: Afebrile. No acute distress.  HENT: AT. [place]. Eyes:Pupils Equal Round Reactive to light, Extraocular movements intact,  Conjunctiva without redness, discharge or icterus. Neck/lymp/endocrine: Supple,no lymphadenopathy, no thyromegaly CV: RRR no murmur, no edema, +2/4 P posterior tibialis pulses Chest: CTAB, no wheeze or crackles Skin: no rashes, purpura or petechiae.  Neuro:  Normal gait. PERLA. EOMi. Alert. Oriented x3 Psych: Normal affect, dress and demeanor. Normal speech. Normal thought content and judgment.  No exam data present No results found. No results found for this or any previous visit (from the past 24 hour(s)).  Assessment/Plan: QUAVON WACEK is a 69 y.o. male present for OV for  Essential hypertension, benign/dyslipidemia/overweight Stable.  - continue amlodipine 10 mg daily  -Continue lisinopril 10 mg daily - continue  HCTZ. - Continue Zocor 10 mg daily.  -continuefish oil. - continue ASA 81 mg - routine diet and exercise. - F/U 6 months with provider on hypertension.  (can be CPE)  Gastroesophageal reflux disease with esophagitis Stable.  Continue Protonix PRN  Elevated a1c: a1c collected today Anxiety:  Stable.  - continue  lexapro 20 mg qd.    Reviewed expectations re: course of current medical issues.  Discussed self-management of symptoms.  Outlined signs and symptoms indicating need for more acute intervention.  Patient verbalized understanding and all questions were answered.  Patient received an After-Visit Summary.    No orders of the defined types were placed in this encounter.  Meds ordered this encounter  Medications  . hydrochlorothiazide (HYDRODIURIL) 25 MG tablet    Sig: Take 1  tablet (25 mg total) by mouth daily.    Dispense:  90 tablet    Refill:  3    This prescription was filled on 03/11/2019. Any refills authorized will be placed on file.  . escitalopram (LEXAPRO) 20 MG tablet    Sig: Take 1 tablet (20 mg total) by mouth daily.    Dispense:  90 tablet    Refill:  3    This prescription was filled on 05/12/2019. Any refills authorized will be placed on file.  Marland Kitchen amLODipine (NORVASC) 10 MG tablet    Sig: Take 1 tablet (10 mg total) by mouth daily.    Dispense:  90 tablet    Refill:  3    This prescription was filled on 06/19/2019. Any refills authorized will be placed on file.  Marland Kitchen lisinopril (ZESTRIL) 10 MG tablet    Sig: Take 1 tablet (10 mg total) by mouth daily.    Dispense:  90 tablet    Refill:  1   Referral Orders  No referral(s) requested today    Note is dictated utilizing voice recognition software. Although note has been proof read prior to signing, occasional typographical errors still can be missed. If any questions arise, please do not hesitate to call for verification.   electronically signed by:  Nicholas Pouch, DO  [place]

## 2019-12-16 NOTE — Telephone Encounter (Signed)
His blood pressure today was perfect therefore I want him to continue the regimen that he is currently on.  Please verify this with him. Amlodipine 10 mg daily Lisinopril 10 mg daily Hydrochlorothiazide 25 mg daily  These have all been refilled to his pharmacy now.

## 2020-01-11 ENCOUNTER — Other Ambulatory Visit: Payer: Self-pay | Admitting: Family Medicine

## 2020-01-11 DIAGNOSIS — Z9889 Other specified postprocedural states: Secondary | ICD-10-CM

## 2020-01-11 DIAGNOSIS — M5134 Other intervertebral disc degeneration, thoracic region: Secondary | ICD-10-CM

## 2020-01-11 DIAGNOSIS — M5136 Other intervertebral disc degeneration, lumbar region: Secondary | ICD-10-CM

## 2020-01-11 DIAGNOSIS — M546 Pain in thoracic spine: Secondary | ICD-10-CM

## 2020-01-11 DIAGNOSIS — M5090 Cervical disc disorder, unspecified, unspecified cervical region: Secondary | ICD-10-CM

## 2020-01-11 DIAGNOSIS — G8929 Other chronic pain: Secondary | ICD-10-CM

## 2020-01-12 DIAGNOSIS — G4733 Obstructive sleep apnea (adult) (pediatric): Secondary | ICD-10-CM | POA: Diagnosis not present

## 2020-01-14 ENCOUNTER — Ambulatory Visit (INDEPENDENT_AMBULATORY_CARE_PROVIDER_SITE_OTHER): Payer: PPO | Admitting: Family Medicine

## 2020-01-14 ENCOUNTER — Encounter: Payer: Self-pay | Admitting: Family Medicine

## 2020-01-14 ENCOUNTER — Other Ambulatory Visit: Payer: Self-pay

## 2020-01-14 VITALS — BP 125/71 | HR 80 | Temp 97.8°F | Resp 18 | Ht 74.0 in | Wt 227.4 lb

## 2020-01-14 DIAGNOSIS — M5136 Other intervertebral disc degeneration, lumbar region: Secondary | ICD-10-CM | POA: Diagnosis not present

## 2020-01-14 DIAGNOSIS — Z9889 Other specified postprocedural states: Secondary | ICD-10-CM | POA: Diagnosis not present

## 2020-01-14 DIAGNOSIS — M5134 Other intervertebral disc degeneration, thoracic region: Secondary | ICD-10-CM | POA: Diagnosis not present

## 2020-01-14 DIAGNOSIS — M546 Pain in thoracic spine: Secondary | ICD-10-CM | POA: Diagnosis not present

## 2020-01-14 DIAGNOSIS — G8929 Other chronic pain: Secondary | ICD-10-CM | POA: Diagnosis not present

## 2020-01-14 DIAGNOSIS — M5090 Cervical disc disorder, unspecified, unspecified cervical region: Secondary | ICD-10-CM | POA: Diagnosis not present

## 2020-01-14 MED ORDER — OXYCODONE-ACETAMINOPHEN 10-325 MG PO TABS: 1.0000 | ORAL_TABLET | Freq: Two times a day (BID) | ORAL | 0 refills | Status: DC

## 2020-01-14 MED ORDER — GABAPENTIN 600 MG PO TABS: ORAL_TABLET | ORAL | 1 refills | Status: DC

## 2020-01-14 MED ORDER — BACLOFEN 20 MG PO TABS: 10.0000 mg | ORAL_TABLET | Freq: Three times a day (TID) | ORAL | 1 refills | Status: DC

## 2020-01-14 NOTE — Progress Notes (Signed)
Patient Care Team    Relationship Specialty Notifications Start End  [name], DO PCP - General Family Medicine  10/31/15   [name], MD (Inactive) Consulting Physician Gastroenterology  04/24/16     SUBJECTIVE Chief Complaint  Patient presents with  . Hypertension    Refills on medications.   . Anxiety    HPI: Nicholas Graham is a 69 y.o. male present for chronic pain management  Cervical neck pain with evidence of disc disease/Chronic midline thoracic back pain/Thoracic degenerative disc disease/DDD (degenerative disc disease), lumbar/History of lumbar discectomy Pt presents today to start chronic pain management treatment for his cervical and lumbar conditions. His discomfort has progressed over the last year. He has been seen by neuro and pain rehab. He has received injections- that have only provided a few days of temporary relief. He is frustrated, but has appropriate expectations that he will be living with pain. He is able to maintain some quality of life with the following medications and reports compliance with Percocet 10-3 25 1  tab every 12 hours, baclofen and gabapentin 600/600/600.  Indication for chronic opioid: Cervical spine degeneration, chronic neck and back pain. Medication and dose: Oxycodone-acetaminophen 10-325 1 tab twice daily # pills per month: 60 Last UDS date:10/14/2019 Pain contract signed (Y/N): Yes Date narcotic database last reviewed (include red flags): 01/14/2020  Original note: Lower cervical to mid thoracic pain still present. Using his right arm exacerbates his pain midline of thoracic.  He has a history thoracic spine degeneration and discectomy in his lumbar spine.  Has degenerative changes, bone spurring and facet arthropathy in his lumbar spine by x-rays obtained 11/2018.Cervical spine x-ray with mild bilateral foraminal narrowing at C3-C4 11/2018. Unfortunately he was reported as allergic or intolerant to many of the medications  that would be helpful for him such as gabapentin, Lyrica, Celebrex and Cymbalta --> we discussed this in more detail last visit and he believes that the "allergy-hallucinations " was secondary to having multiple of these medicines on board at the same time back in 2014. We tried to start gabapentin and it did not have any side effects from medication. It has not been extremely helpful  Yet at lower doses. He admits to rather sigmnificant stomach upset starting from the naproxen BID use ober the last 8 week. Advanced imaging has not bee able to be completed 2/2 to covid outbreak. He desired to wait on referral to specialist last visit in hope that he would receive benefit from the gabapentin.    ROS: See pertinent positives and negatives per HPI.  Patient Active Problem List   Diagnosis Date Noted  . GERD (gastroesophageal reflux disease) 06/23/2019  . Cervical neck pain with evidence of disc disease 01/15/2019  . Chronic midline thoracic back pain 01/15/2019  . Thoracic degenerative disc disease 01/15/2019  . History of lumbar discectomy 01/15/2019  . DDD (degenerative disc disease), lumbar 01/15/2019  . OSA on CPAP 07/04/2018  . Overweight (BMI 25.0-29.9) 05/01/2018  . Benign prostatic hyperplasia with lower urinary tract symptoms 11/15/2017  . Anxiety 09/12/2017  . Stopped smoking with greater than 30 pack year history 07/30/2017  . Pulmonary lesion 05/16/2017  . Alcohol ingestion of more than 4 drinks/week 09/10/2014  . Elevated hemoglobin A1c 02/19/2014  . Dyslipidemia 02/19/2014  . Essential hypertension, benign 02/19/2014  . History of colonic polyps 09/23/2009  . GERD 12/12/2007    Social History   Tobacco Use  . Smoking status: Former Smoker  Packs/day: 3.00    Years: 30.00    Pack years: 90.00    Quit date: 07/16/2002    Years since quitting: 17.5  . Smokeless tobacco: Never Used  Substance Use Topics  . Alcohol use: Yes    Alcohol/week: 1.0 - 2.0 standard drinks     Types: 1 - 2 Cans of beer per week    Comment: socially    Current Outpatient Medications:  .  amLODipine (NORVASC) 10 MG tablet, Take 1 tablet (10 mg total) by mouth daily., Disp: 90 tablet, Rfl: 3 .  aspirin 81 MG tablet, Take 81 mg by mouth daily., Disp: , Rfl:  .  baclofen (LIORESAL) 20 MG tablet, Take 0.5-1 tablets (10-20 mg total) by mouth 3 (three) times daily., Disp: 270 tablet, Rfl: 1 .  escitalopram (LEXAPRO) 20 MG tablet, Take 1 tablet (20 mg total) by mouth daily., Disp: 90 tablet, Rfl: 3 .  gabapentin (NEURONTIN) 600 MG tablet, 1 tab morning, 1.5 tabs afternoon, 1 tab evening,  doses should be every 8 hours., Disp: 315 tablet, Rfl: 1 .  hydrochlorothiazide (HYDRODIURIL) 25 MG tablet, Take 1 tablet (25 mg total) by mouth daily., Disp: 90 tablet, Rfl: 3 .  lisinopril (ZESTRIL) 10 MG tablet, Take 1 tablet (10 mg total) by mouth daily., Disp: 90 tablet, Rfl: 1 .  MegaRed Omega-3 Krill Oil 500 MG CAPS, Take 2 capsules by mouth daily. , Disp: , Rfl:  .  pantoprazole (PROTONIX) 40 MG tablet, Take 1 tablet (40 mg total) by mouth daily., Disp: 90 tablet, Rfl: 3 .  simvastatin (ZOCOR) 10 MG tablet, TAKE ONE TABLET BY MOUTH EVERY OTHER DAY, Disp: 15 tablet, Rfl: 11 .  oxyCODONE-acetaminophen (PERCOCET) 10-325 MG tablet, Take 1 tablet by mouth every 12 (twelve) hours., Disp: 60 tablet, Rfl: 0  Allergies  Allergen Reactions  . Celebrex [Celecoxib]   . Cymbalta [Duloxetine Hcl]   . Lyrica [Pregabalin]   . Penicillins     REACTION: hives    OBJECTIVE: BP 125/71 (BP Location: Left Arm, Patient Position: Sitting, Cuff Size: Normal)   Pulse 80   Temp 97.8 F (36.6 C) (Temporal)   Resp 18   Ht 6\' 2"  (1.88 m)   Wt 227 lb 6 oz (103.1 kg)   SpO2 98%   BMI 29.19 kg/m   Gen: Afebrile. No acute distress.  HENT: AT. [place].  Eyes:Pupils Equal Round Reactive to light, Extraocular movements intact,  Conjunctiva without redness, discharge or icterus. CV: RRR Chest: CTAB, no wheeze or crackles    Neuro:  Normal gait. PERLA. EOMi. Alert. Oriented x3 Psych: Normal affect, dress and demeanor. Normal speech. Normal thought content and judgment.   ASSESSMENT AND PLAN: VALENTE KOKER is a 69 y.o. male present for  Cervical neck pain with evidence of disc disease/Chronic midline thoracic back pain/Thoracic degenerative disc disease/DDD (degenerative disc disease), lumbar/History of lumbar discectomy - persistent pain despite treatments though pain rehab. Agreed to over take pain management. Pt counseled on need of face to face encounter every 3 months for pain management alone.  -[place] controlled substance database reviewed and appropriate. - Pain contract signed  - UDS UTD -Patient discontinued Mobic. - continue  Gabapentin try increased to 600/900/600> if tolerates need a higher dose can continue to increase, try 800 mg tab 3 times daily etc.  Right now he is at the beginning of a new bottle gabapentin so will make the most of current pill dosage -Continue baclofen 10-20 mg 3 times daily -  Continue percocet 10-325 mg BID #60 prescribed with 2 refills (held) at pharmacy.  - f/u 3 mos   No orders of the defined types were placed in this encounter.  Meds ordered this encounter  Medications  . gabapentin (NEURONTIN) 600 MG tablet    Sig: 1 tab morning, 1.5 tabs afternoon, 1 tab evening,  doses should be every 8 hours.    Dispense:  315 tablet    Refill:  1    Pt reports not true allergy- was a combination of meds that caused symptoms.  . baclofen (LIORESAL) 20 MG tablet    Sig: Take 0.5-1 tablets (10-20 mg total) by mouth 3 (three) times daily.    Dispense:  270 tablet    Refill:  1  . DISCONTD: oxyCODONE-acetaminophen (PERCOCET) 10-325 MG tablet    Sig: Take 1 tablet by mouth every 12 (twelve) hours.    Dispense:  60 tablet    Refill:  0  . DISCONTD: oxyCODONE-acetaminophen (PERCOCET) 10-325 MG tablet    Sig: Take 1 tablet by mouth every 12 (twelve) hours.    Dispense:   60 tablet    Refill:  0    Refill after 02/09/2020  . oxyCODONE-acetaminophen (PERCOCET) 10-325 MG tablet    Sig: Take 1 tablet by mouth every 12 (twelve) hours.    Dispense:  60 tablet    Refill:  0    Refill after 03/06/2020      Howard Pouch, DO 01/15/2020

## 2020-01-14 NOTE — Patient Instructions (Signed)
Great to see you today.  3 months on pain management.  Gabapentin dose increased to 600/900/600 .

## 2020-01-18 ENCOUNTER — Telehealth: Payer: Self-pay

## 2020-01-18 NOTE — Telephone Encounter (Signed)
Pt called and stated since increasing the Gabapentin he has been getting headaches in the AM. He wakes up with a headache. Pt advised to start taking BP in the AM and make sure it is not elevated as well as drinking plenty of water during the day, he agreed. Pt asking if it is medication increase causing this or something else. Pt advised Dr Raoul Pitch was not here this afternoon.

## 2020-01-19 NOTE — Telephone Encounter (Signed)
Pts wife was called and given instructions, she verbalized understanding and will give pt information

## 2020-01-19 NOTE — Telephone Encounter (Signed)
I recommend go back to the gabapentin 600 mg 3 times daily dosing and see if the symptoms resolve.  Headache can be caused for many reasons, therefore there is no way to be certain it is from the increase in his gabapentin dose. Agree with monitoring blood pressures. Morning headaches sometimes are attributed to sleep apnea-which she has.  The increased dose and gabapentin could be making his sleep apnea worse.

## 2020-01-27 ENCOUNTER — Telehealth: Payer: Self-pay

## 2020-01-27 NOTE — Telephone Encounter (Signed)
BP readings are good.

## 2020-01-27 NOTE — Telephone Encounter (Signed)
Pt reported BP readings over the past week, he has been taking 1x daily:  BP          Pulse 133/74          72 127/76          71 113/81          68 118/74          71 123/71          70 118/80          82 129/74          72

## 2020-01-27 NOTE — Telephone Encounter (Signed)
Pt was called and told to continue what he is doing, good numbers

## 2020-03-10 ENCOUNTER — Other Ambulatory Visit: Payer: Self-pay | Admitting: Family Medicine

## 2020-03-31 ENCOUNTER — Other Ambulatory Visit: Payer: Self-pay

## 2020-03-31 ENCOUNTER — Ambulatory Visit (INDEPENDENT_AMBULATORY_CARE_PROVIDER_SITE_OTHER): Payer: PPO | Admitting: Family Medicine

## 2020-03-31 ENCOUNTER — Encounter: Payer: Self-pay | Admitting: Family Medicine

## 2020-03-31 VITALS — BP 128/78 | HR 76 | Temp 98.2°F | Resp 17 | Ht 74.0 in | Wt 223.0 lb

## 2020-03-31 DIAGNOSIS — G8929 Other chronic pain: Secondary | ICD-10-CM | POA: Diagnosis not present

## 2020-03-31 DIAGNOSIS — M5136 Other intervertebral disc degeneration, lumbar region: Secondary | ICD-10-CM | POA: Diagnosis not present

## 2020-03-31 DIAGNOSIS — M546 Pain in thoracic spine: Secondary | ICD-10-CM

## 2020-03-31 DIAGNOSIS — M5134 Other intervertebral disc degeneration, thoracic region: Secondary | ICD-10-CM | POA: Diagnosis not present

## 2020-03-31 DIAGNOSIS — M5090 Cervical disc disorder, unspecified, unspecified cervical region: Secondary | ICD-10-CM | POA: Diagnosis not present

## 2020-03-31 DIAGNOSIS — Z9889 Other specified postprocedural states: Secondary | ICD-10-CM | POA: Diagnosis not present

## 2020-03-31 MED ORDER — OXYCODONE-ACETAMINOPHEN 10-325 MG PO TABS: 1.0000 | ORAL_TABLET | Freq: Two times a day (BID) | ORAL | 0 refills | Status: DC

## 2020-03-31 MED ORDER — BACLOFEN 20 MG PO TABS: 10.0000 mg | ORAL_TABLET | Freq: Three times a day (TID) | ORAL | 1 refills | Status: DC

## 2020-03-31 MED ORDER — GABAPENTIN 600 MG PO TABS: 600.0000 mg | ORAL_TABLET | Freq: Three times a day (TID) | ORAL | 1 refills | Status: DC

## 2020-03-31 NOTE — Progress Notes (Signed)
Patient Care Team    Relationship Specialty Notifications Start End  [name], DO PCP - General Family Medicine  10/31/15   [name], MD (Inactive) Consulting Physician Gastroenterology  04/24/16     SUBJECTIVE Chief Complaint  Patient presents with  . pain management    Pt needs refills on pain meds.     HPI: Nicholas Graham is a 69 y.o. male present for chronic pain management  Cervical neck pain with evidence of disc disease/Chronic midline thoracic back pain/Thoracic degenerative disc disease/DDD (degenerative disc disease), lumbar/History of lumbar discectomy Pt presents today to start chronic pain management treatment for his cervical and lumbar conditions. His discomfort has progressed over the last year- but is better controlled on current meds. He is walking 2-3 miles a day. He has been seen by neuro and pain rehab. He has received injections- that have only provided a few days of temporary relief. He is frustrated, but has appropriate expectations that he will be living with pain. He is able to maintain some quality of life with the following medications and reports compliance with Percocet 10-325 (1 tab every 12 hours), baclofen and gabapentin 600/600/600 (had headaches at increased doses..  Indication for chronic opioid: Cervical spine degeneration, chronic neck and back pain. Medication and dose: Oxycodone-acetaminophen 10-325 1 tab twice daily # pills per month: 60 Last UDS date:10/14/2019 Pain contract signed (Y/N): Yes Date narcotic database last reviewed (include red flags): 03/31/20   Original note: Lower cervical to mid thoracic pain still present. Using his right arm exacerbates his pain midline of thoracic.  He has a history thoracic spine degeneration and discectomy in his lumbar spine.  Has degenerative changes, bone spurring and facet arthropathy in his lumbar spine by x-rays obtained 11/2018.Cervical spine x-ray with mild bilateral foraminal  narrowing at C3-C4 11/2018. Unfortunately he was reported as allergic or intolerant to many of the medications that would be helpful for him such as gabapentin, Lyrica, Celebrex and Cymbalta --> we discussed this in more detail last visit and he believes that the "allergy-hallucinations " was secondary to having multiple of these medicines on board at the same time back in 2014. We tried to start gabapentin and it did not have any side effects from medication. It has not been extremely helpful  Yet at lower doses. He admits to rather sigmnificant stomach upset starting from the naproxen BID use ober the last 8 week. Advanced imaging has not bee able to be completed 2/2 to covid outbreak. He desired to wait on referral to specialist last visit in hope that he would receive benefit from the gabapentin.    ROS: See pertinent positives and negatives per HPI.  Patient Active Problem List   Diagnosis Date Noted  . GERD (gastroesophageal reflux disease) 06/23/2019  . Cervical neck pain with evidence of disc disease 01/15/2019  . Chronic midline thoracic back pain 01/15/2019  . Thoracic degenerative disc disease 01/15/2019  . History of lumbar discectomy 01/15/2019  . DDD (degenerative disc disease), lumbar 01/15/2019  . OSA on CPAP 07/04/2018  . Overweight (BMI 25.0-29.9) 05/01/2018  . Benign prostatic hyperplasia with lower urinary tract symptoms 11/15/2017  . Anxiety 09/12/2017  . Stopped smoking with greater than 30 pack year history 07/30/2017  . Pulmonary lesion 05/16/2017  . Alcohol ingestion of more than 4 drinks/week 09/10/2014  . Elevated hemoglobin A1c 02/19/2014  . Dyslipidemia 02/19/2014  . Essential hypertension, benign 02/19/2014  . History of colonic polyps 09/23/2009  .  GERD 12/12/2007    Social History   Tobacco Use  . Smoking status: Former Smoker    Packs/day: 3.00    Years: 30.00    Pack years: 90.00    Quit date: 07/16/2002    Years since quitting: 17.7  . Smokeless  tobacco: Never Used  Substance Use Topics  . Alcohol use: Yes    Alcohol/week: 1.0 - 2.0 standard drink    Types: 1 - 2 Cans of beer per week    Comment: socially    Current Outpatient Medications:  .  amLODipine (NORVASC) 10 MG tablet, Take 1 tablet (10 mg total) by mouth daily., Disp: 90 tablet, Rfl: 3 .  aspirin 81 MG tablet, Take 81 mg by mouth daily., Disp: , Rfl:  .  baclofen (LIORESAL) 20 MG tablet, Take 0.5-1 tablets (10-20 mg total) by mouth 3 (three) times daily., Disp: 270 tablet, Rfl: 1 .  escitalopram (LEXAPRO) 20 MG tablet, Take 1 tablet (20 mg total) by mouth daily., Disp: 90 tablet, Rfl: 3 .  gabapentin (NEURONTIN) 600 MG tablet, Take 1 tablet (600 mg total) by mouth 3 (three) times daily. 1 tab morning, 1.5 tabs afternoon, 1 tab evening,  doses should be every 8 hours., Disp: 270 tablet, Rfl: 1 .  hydrochlorothiazide (HYDRODIURIL) 25 MG tablet, Take 1 tablet (25 mg total) by mouth daily., Disp: 90 tablet, Rfl: 3 .  lisinopril (ZESTRIL) 10 MG tablet, Take 1 tablet (10 mg total) by mouth daily., Disp: 90 tablet, Rfl: 1 .  MegaRed Omega-3 Krill Oil 500 MG CAPS, Take 2 capsules by mouth daily. , Disp: , Rfl:  .  pantoprazole (PROTONIX) 40 MG tablet, Take 1 tablet (40 mg total) by mouth daily., Disp: 90 tablet, Rfl: 3 .  simvastatin (ZOCOR) 10 MG tablet, TAKE ONE TABLET BY MOUTH EVERY OTHER DAY, Disp: 15 tablet, Rfl: 11 .  oxyCODONE-acetaminophen (PERCOCET) 10-325 MG tablet, Take 1 tablet by mouth every 12 (twelve) hours., Disp: 60 tablet, Rfl: 0  Allergies  Allergen Reactions  . Celebrex [Celecoxib]   . Cymbalta [Duloxetine Hcl]   . Lyrica [Pregabalin]   . Penicillins     REACTION: hives    OBJECTIVE: BP 128/78 (BP Location: Left Arm, Patient Position: Sitting, Cuff Size: Normal)   Pulse 76   Temp 98.2 F (36.8 C) (Temporal)   Resp 17   Ht 6\' 2"  (1.88 m)   Wt 223 lb (101.2 kg)   SpO2 97%   BMI 28.63 kg/m   Gen: Afebrile. No acute distress.  HENT: AT. [place].    Eyes:Pupils Equal Round Reactive to light, Extraocular movements intact,  Conjunctiva without redness, discharge or icterus. CV: RRR  Chest: CTAB, no wheeze or crackles  Neuro: Normal gait. PERLA. EOMi. Alert. Oriented. Psych: Normal affect, dress and demeanor. Normal speech. Normal thought content and judgment..    ASSESSMENT AND PLAN: Nicholas Graham is a 69 y.o. male present for  Cervical neck pain with evidence of disc disease/Chronic midline thoracic back pain/Thoracic degenerative disc disease/DDD (degenerative disc disease), lumbar/History of lumbar discectomy - persistent pain despite treatments though pain rehab. Agreed to over take pain management. Pt counseled on need of face to face encounter every 3 months for pain management alone.  -[place] controlled substance database reviewed and appropriate. - Pain contract signed  - UDS UTD -Patient discontinued Mobic. - continue  Gabapentin  600/600/600> did not tolerate higher dose (headache) - continue baclofen 10-20 mg 3 times daily - continue percocet 10-325 mg  BID #60 prescribed with 2 refills (held) at pharmacy.  - f/u 3 mos   No orders of the defined types were placed in this encounter.  Meds ordered this encounter  Medications  . baclofen (LIORESAL) 20 MG tablet    Sig: Take 0.5-1 tablets (10-20 mg total) by mouth 3 (three) times daily.    Dispense:  270 tablet    Refill:  1  . gabapentin (NEURONTIN) 600 MG tablet    Sig: Take 1 tablet (600 mg total) by mouth 3 (three) times daily. 1 tab morning, 1.5 tabs afternoon, 1 tab evening,  doses should be every 8 hours.    Dispense:  270 tablet    Refill:  1    Pt reports not true allergy- was a combination of meds that caused symptoms.  . DISCONTD: oxyCODONE-acetaminophen (PERCOCET) 10-325 MG tablet    Sig: Take 1 tablet by mouth every 12 (twelve) hours.    Dispense:  60 tablet    Refill:  0  . DISCONTD: oxyCODONE-acetaminophen (PERCOCET) 10-325 MG tablet    Sig:  Take 1 tablet by mouth every 12 (twelve) hours.    Dispense:  60 tablet    Refill:  0    May refill after 04/26/2020  . oxyCODONE-acetaminophen (PERCOCET) 10-325 MG tablet    Sig: Take 1 tablet by mouth every 12 (twelve) hours.    Dispense:  60 tablet    Refill:  0    May refill after 05/22/2020      Howard Pouch, DO 03/31/2020

## 2020-03-31 NOTE — Patient Instructions (Addendum)
Nice to see you today.  I have refilled your pain meds.  I am glad you are walking.    Follow up 3 mos. For pain.  Around 06/20/2020   Continue your routine f/u for other chronic conditions/physical Mid September.

## 2020-05-31 ENCOUNTER — Ambulatory Visit (INDEPENDENT_AMBULATORY_CARE_PROVIDER_SITE_OTHER): Payer: PPO | Admitting: Family Medicine

## 2020-05-31 ENCOUNTER — Other Ambulatory Visit: Payer: Self-pay

## 2020-05-31 ENCOUNTER — Encounter: Payer: Self-pay | Admitting: Family Medicine

## 2020-05-31 VITALS — BP 122/79 | HR 81 | Temp 98.1°F | Resp 16 | Ht 72.84 in | Wt 217.6 lb

## 2020-05-31 DIAGNOSIS — E785 Hyperlipidemia, unspecified: Secondary | ICD-10-CM | POA: Diagnosis not present

## 2020-05-31 DIAGNOSIS — Z125 Encounter for screening for malignant neoplasm of prostate: Secondary | ICD-10-CM | POA: Diagnosis not present

## 2020-05-31 DIAGNOSIS — F419 Anxiety disorder, unspecified: Secondary | ICD-10-CM

## 2020-05-31 DIAGNOSIS — R7309 Other abnormal glucose: Secondary | ICD-10-CM

## 2020-05-31 DIAGNOSIS — Z86010 Personal history of colonic polyps: Secondary | ICD-10-CM | POA: Diagnosis not present

## 2020-05-31 DIAGNOSIS — Z0000 Encounter for general adult medical examination without abnormal findings: Secondary | ICD-10-CM | POA: Diagnosis not present

## 2020-05-31 DIAGNOSIS — K2100 Gastro-esophageal reflux disease with esophagitis, without bleeding: Secondary | ICD-10-CM

## 2020-05-31 DIAGNOSIS — I10 Essential (primary) hypertension: Secondary | ICD-10-CM

## 2020-05-31 DIAGNOSIS — Z1159 Encounter for screening for other viral diseases: Secondary | ICD-10-CM

## 2020-05-31 LAB — COMPREHENSIVE METABOLIC PANEL
ALT: 17 U/L (ref 0–53)
AST: 21 U/L (ref 0–37)
Albumin: 4.6 g/dL (ref 3.5–5.2)
Alkaline Phosphatase: 55 U/L (ref 39–117)
BUN: 16 mg/dL (ref 6–23)
CO2: 29 mEq/L (ref 19–32)
Calcium: 9.7 mg/dL (ref 8.4–10.5)
Chloride: 102 mEq/L (ref 96–112)
Creatinine, Ser: 0.94 mg/dL (ref 0.40–1.50)
GFR: 80.08 mL/min (ref >60.00)
Glucose, Bld: 106 mg/dL — ABNORMAL HIGH (ref 70–99)
Potassium: 4 mEq/L (ref 3.5–5.1)
Sodium: 139 mEq/L (ref 135–145)
Total Bilirubin: 0.9 mg/dL (ref 0.2–1.2)
Total Protein: 6.8 g/dL (ref 6.0–8.3)

## 2020-05-31 LAB — LIPID PANEL
Cholesterol: 156 mg/dL (ref 0–200)
HDL: 44.4 mg/dL (ref >39.00)
LDL Cholesterol: 97 mg/dL (ref 0–99)
NonHDL: 111.41
Total CHOL/HDL Ratio: 4
Triglycerides: 74 mg/dL (ref 0.0–149.0)
VLDL: 14.8 mg/dL (ref 0.0–40.0)

## 2020-05-31 LAB — HEMOGLOBIN A1C: Hgb A1c MFr Bld: 5.8 % (ref 4.6–6.5)

## 2020-05-31 LAB — CBC
HCT: 44 % (ref 39.0–52.0)
Hemoglobin: 14.7 g/dL (ref 13.0–17.0)
MCHC: 33.4 g/dL (ref 30.0–36.0)
MCV: 91.7 fl (ref 78.0–100.0)
Platelets: 141 10*3/uL — ABNORMAL LOW (ref 150.0–400.0)
RBC: 4.8 Mil/uL (ref 4.22–5.81)
RDW: 14.3 % (ref 11.5–15.5)
WBC: 4.6 10*3/uL (ref 4.0–10.5)

## 2020-05-31 LAB — TSH: TSH: 1.18 u[IU]/mL (ref 0.35–4.50)

## 2020-05-31 LAB — PSA, MEDICARE: PSA: 1.64 ng/ml (ref 0.10–4.00)

## 2020-05-31 MED ORDER — AMLODIPINE BESYLATE 10 MG PO TABS: 10.0000 mg | ORAL_TABLET | Freq: Every day | ORAL | 3 refills | Status: DC

## 2020-05-31 MED ORDER — TETANUS-DIPHTH-ACELL PERTUSSIS 5-2.5-18.5 LF-MCG/0.5 IM SUSP: 0.5000 mL | Freq: Once | INTRAMUSCULAR | 0 refills | Status: AC

## 2020-05-31 MED ORDER — LISINOPRIL 10 MG PO TABS
10.0000 mg | ORAL_TABLET | Freq: Every day | ORAL | 1 refills | Status: DC
Start: 2020-05-31 — End: 2020-12-12

## 2020-05-31 MED ORDER — HYDROCHLOROTHIAZIDE 25 MG PO TABS: 25.0000 mg | ORAL_TABLET | Freq: Every day | ORAL | 3 refills | Status: DC

## 2020-05-31 MED ORDER — ESCITALOPRAM OXALATE 20 MG PO TABS
20.0000 mg | ORAL_TABLET | Freq: Every day | ORAL | 3 refills | Status: DC
Start: 2020-05-31 — End: 2021-03-15

## 2020-05-31 MED ORDER — SIMVASTATIN 10 MG PO TABS: ORAL_TABLET | ORAL | 11 refills | Status: DC

## 2020-05-31 MED ORDER — PANTOPRAZOLE SODIUM 40 MG PO TBEC: 40.0000 mg | DELAYED_RELEASE_TABLET | Freq: Every day | ORAL | 3 refills | Status: DC

## 2020-05-31 NOTE — Patient Instructions (Addendum)
FELS NAPTHA is the soap for poison ivy prevention.   Health Maintenance, Male Adopting a healthy lifestyle and getting preventive care are important in promoting health and wellness. Ask your health care provider about:  The right schedule for you to have regular tests and exams.  Things you can do on your own to prevent diseases and keep yourself healthy. What should I know about diet, weight, and exercise? Eat a healthy diet   Eat a diet that includes plenty of vegetables, fruits, low-fat dairy products, and lean protein.  Do not eat a lot of foods that are high in solid fats, added sugars, or sodium. Maintain a healthy weight Body mass index (BMI) is a measurement that can be used to identify possible weight problems. It estimates body fat based on height and weight. Your health care provider can help determine your BMI and help you achieve or maintain a healthy weight. Get regular exercise Get regular exercise. This is one of the most important things you can do for your health. Most adults should:  Exercise for at least 150 minutes each week. The exercise should increase your heart rate and make you sweat (moderate-intensity exercise).  Do strengthening exercises at least twice a week. This is in addition to the moderate-intensity exercise.  Spend less time sitting. Even light physical activity can be beneficial. Watch cholesterol and blood lipids Have your blood tested for lipids and cholesterol at 67 years of age, then have this test every 5 years. You may need to have your cholesterol levels checked more often if:  Your lipid or cholesterol levels are high.  You are older than 67 years of age.  You are at high risk for heart disease. What should I know about cancer screening? Many types of cancers can be detected early and may often be prevented. Depending on your health history and family history, you may need to have cancer screening at various ages. This may include  screening for:  Colorectal cancer.  Prostate cancer.  Skin cancer.  Lung cancer. What should I know about heart disease, diabetes, and high blood pressure? Blood pressure and heart disease  High blood pressure causes heart disease and increases the risk of stroke. This is more likely to develop in people who have high blood pressure readings, are of African descent, or are overweight.  Talk with your health care provider about your target blood pressure readings.  Have your blood pressure checked: ? Every 3-5 years if you are 3-34 years of age. ? Every year if you are 81 years old or older.  If you are between the ages of 4 and 46 and are a current or former smoker, ask your health care provider if you should have a one-time screening for abdominal aortic aneurysm (AAA). Diabetes Have regular diabetes screenings. This checks your fasting blood sugar level. Have the screening done:  Once every three years after age 57 if you are at a normal weight and have a low risk for diabetes.  More often and at a younger age if you are overweight or have a high risk for diabetes. What should I know about preventing infection? Hepatitis B If you have a higher risk for hepatitis B, you should be screened for this virus. Talk with your health care provider to find out if you are at risk for hepatitis B infection. Hepatitis C Blood testing is recommended for:  Everyone born from [birth year] through 1963.  Anyone with known risk factors for hepatitis C.  Sexually transmitted infections (STIs)  You should be screened each year for STIs, including gonorrhea and chlamydia, if: ? You are sexually active and are younger than 67 years of age. ? You are older than 67 years of age and your health care provider tells you that you are at risk for this type of infection. ? Your sexual activity has changed since you were last screened, and you are at increased risk for chlamydia or gonorrhea. Ask your health  care provider if you are at risk.  Ask your health care provider about whether you are at high risk for HIV. Your health care provider may recommend a prescription medicine to help prevent HIV infection. If you choose to take medicine to prevent HIV, you should first get tested for HIV. You should then be tested every 3 months for as long as you are taking the medicine. Follow these instructions at home: Lifestyle  Do not use any products that contain nicotine or tobacco, such as cigarettes, e-cigarettes, and chewing tobacco. If you need help quitting, ask your health care provider.  Do not use street drugs.  Do not share needles.  Ask your health care provider for help if you need support or information about quitting drugs. Alcohol use  Do not drink alcohol if your health care provider tells you not to drink.  If you drink alcohol: ? Limit how much you have to 0-2 drinks a day. ? Be aware of how much alcohol is in your drink. In the [place] one drink equals one 12 oz bottle of beer (355 mL), one 5 oz glass of wine (148 mL), or one 1 oz glass of hard liquor (44 mL). General instructions  Schedule regular health, dental, and eye exams.  Stay current with your vaccines.  Tell your health care provider if: ? You often feel depressed. ? You have ever been abused or do not feel safe at home. Summary  Adopting a healthy lifestyle and getting preventive care are important in promoting health and wellness.  Follow your health care provider's instructions about healthy diet, exercising, and getting tested or screened for diseases.  Follow your health care provider's instructions on monitoring your cholesterol and blood pressure. This information is not intended to replace advice given to you by your health care provider. Make sure you discuss any questions you have with your health care provider. Document Revised: 08/27/2018 Document Reviewed: 08/27/2018 Elsevier Patient Education  2020  [employer].

## 2020-05-31 NOTE — Progress Notes (Signed)
This visit occurred during the SARS-CoV-2 public health emergency.  Safety protocols were in place, including screening questions prior to the visit, additional usage of staff PPE, and extensive cleaning of exam room while observing appropriate contact time as indicated for disinfecting solutions.    Patient ID: Nicholas Graham, male  DOB: [birth year], 69 y.o.   MRN: [id number] Patient Care Team    Relationship Specialty Notifications Start End  [name], DO PCP - General Family Medicine  10/31/15   [name], MD (Inactive) Consulting Physician Gastroenterology  04/24/16     Chief Complaint  Patient presents with  . Annual Exam    CPE    Subjective:  Nicholas Graham is a 69 y.o. male present for Nicholas Graham. All past medical history, surgical history, allergies, family history, immunizations, medications and social history were updated in the electronic medical record today. All recent labs, ED visits and hospitalizations within the last year were reviewed.  Health maintenance:  Colonoscopy: last screen 2017, r 24yrrecall ; resulted diverticulosis, x3 polyps. Dr. AHavery Moros. Due 06/2022 Immunizations: declines all immunizations. Tdap printed.  Infectious disease screening: Declines HIV screening.  Agreeable to hepatitis C screening. PSA: Collected today. Lab Results  Component Value Date   PSA 1.64 05/31/2020   PSA 2.65 05/15/2017   PSA 2.03 04/23/2016  , pt was counseled on prostate cancer screenings.  Assistive device: none Oxygen uDDU:KGURPatient has a Dental home. Hospitalizations/ED visits: reviewed  Hypertension/HLD/overweight: Pt reportscompliancewith amlodipine 10 mg daily, lisinopril 10 mg QD and HCTZ. .Patient denies chest pain, shortness of breath, dizziness or lower extremity edema.  Pt takes a daily baby ASA. Pt is prescribed statin.  Labs UTD Diet: Low-sodium Exercise: Not exercising as routinely. RF: Hypertension, hyperlipidemia, family history  of heart disease. Overweight.  ANXIETY:  Patient reports he is feeling well with use of lexapro 20  Mg qd. 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   Gerd: Patient reports he is still requiring PPI daily use in order to avoid symptoms.  [facility] 05/31/2020 12/16/2019 06/23/2019 06/19/2018 12/13/2017  Decreased Interest 0 0 0 0 0  Down, Depressed, Hopeless 0 0 0 0 0  PHQ - 2 Score 0 0 0 0 0  Altered sleeping - - - - -  Tired, decreased energy - - - - -  Change in appetite - - - - -  Feeling bad or failure about yourself  - - - - -  Trouble concentrating - - - - -  Moving slowly or fidgety/restless - - - - -  Suicidal thoughts - - - - -  PHQ-9 Score - - - - -  Difficult doing work/chores - - - - -   GAD 7 : Generalized Anxiety Score 01/14/2020 12/16/2019 06/23/2019 11/15/2017  Nervous, Anxious, on Edge 0 0 0 0  Control/stop worrying 0 0 0 0  Worry too much - different things 0 0 0 0  Trouble relaxing 3 0 0 0  Restless _0 Easily annoyed or irritable 1 0 0 0  Afraid - awful might happen 0 0 0 0  Total GAD 7 Score _1 Anxiety Difficulty Not difficult at all Somewhat difficult Not difficult at all Not difficult at all       Fall Risk  08/10/2019 06/19/2018 05/15/2017 04/23/2016  Falls in the past year? 0 No No No  Comment Emmi Telephone Survey: data  to providers prior to load - - -    There is no immunization history on file for this patient.  Past Medical History:  Diagnosis Date  . Arm fracture    left. Dx'd w/RSD post fracture  . Carpal tunnel syndrome   . Chest pain 2016   [facility] (Dr. Matthew Saras) doing stress echo, but feels like musculoskeletal chest wall pain is cause of his discomfort  . Chronic back pain   . Deviated septum   . Diverticulosis   . GERD (gastroesophageal reflux disease)   . Hyperlipidemia   . Hypertension   . Joint pain   . Muscle pain   . Nephrolithiasis   . OSA on CPAP 07/04/2018   - Trial of  CPAP therapy on 17 cm H2O with a Large size [employer]kel Full Face Mask Simplus mask and heated humidification.  . Plantar fasciitis   . Pulmonary nodule 04/2016   [place].  Pt chose to repeat CT chest w/out contrast in 3 mo---as per radiologist's recommendations.  . Right inguinal hernia    fat-containing.  Small hydrocele in left hemiscrotum.  . Snoring   . Swelling    of legs/feet/hands  . Tobacco dependence in remission    Allergies  Allergen Reactions  . Celebrex [Celecoxib]   . Cymbalta [Duloxetine Hcl]   . Lyrica [Pregabalin]   . Penicillins     REACTION: hives   Past Surgical History:  Procedure Laterality Date  . COLONOSCOPY    . fracture repair left arm     had a fixator  . INGUINAL HERNIA REPAIR Left 2013   repeat hernia repair. x2  . KNEE ARTHROSCOPY Right   . [place] [facility]  1993  . NEURECTOMY inguinal after hernia repair Left   . PLANTAR FASCIA SURGERY Right 2000s  . UPPER GI ENDOSCOPY     Family History  Problem Relation Age of Onset  . Lung cancer Mother   . Lung cancer Father   . Bone cancer Brother        bone  . Heart disease Maternal Uncle   . Colon cancer Neg Hx    Social History   Social History Narrative   Married, Arts administrator. Children (2) Adult, 4 grandchildren.    9 th grade, Retired.   Wears seatbelt   Smoke detector in the home.    Firearms locked in the home.    Feels safe in his relationships.     Allergies as of 05/31/2020      Reactions   Celebrex [celecoxib]    Cymbalta [duloxetine Hcl]    Lyrica [pregabalin]    Penicillins    REACTION: hives      Medication List       Accurate as of May 31, 2020 11:59 PM. If you have any questions, ask your nurse or doctor.        amLODipine 10 MG tablet Commonly known as: NORVASC Take 1 tablet (10 mg total) by mouth daily.   aspirin 81 MG tablet Take 81 mg by mouth daily.   baclofen 20 MG tablet Commonly known as: LIORESAL Take 0.5-1 tablets (10-20 mg total) by  mouth 3 (three) times daily.   escitalopram 20 MG tablet Commonly known as: LEXAPRO Take 1 tablet (20 mg total) by mouth daily.   gabapentin 600 MG tablet Commonly known as: NEURONTIN Take 1 tablet (600 mg total) by mouth 3 (three) times daily. 1 tab morning, 1.5 tabs afternoon, 1 tab evening,  doses should be every  8 hours.   hydrochlorothiazide 25 MG tablet Commonly known as: HYDRODIURIL Take 1 tablet (25 mg total) by mouth daily.   lisinopril 10 MG tablet Commonly known as: ZESTRIL Take 1 tablet (10 mg total) by mouth daily.   MegaRed Omega-3 Krill Oil 500 MG Caps Take 2 capsules by mouth daily.   oxyCODONE-acetaminophen 10-325 MG tablet Commonly known as: PERCOCET Take 1 tablet by mouth every 12 (twelve) hours.   pantoprazole 40 MG tablet Commonly known as: PROTONIX Take 1 tablet (40 mg total) by mouth daily.   simvastatin 10 MG tablet Commonly known as: ZOCOR TAKE ONE TABLET BY MOUTH EVERY OTHER DAY   Tdap 5-2.5-18.5 LF-MCG/0.5 injection Commonly known as: BOOSTRIX Inject 0.5 mLs into the muscle once for 1 dose. Started by: Howard Pouch, DO      All past medical history, surgical history, allergies, family history, immunizations andmedications were updated in the EMR today and reviewed under the history and medication portions of their EMR.     Recent Results (from the past 2160 hour(s))  CBC     Status: Abnormal   Collection Time: 05/31/20  9:06 AM  Result Value Ref Range   WBC 4.6 4.0 - 10.5 K/uL   RBC 4.80 4.22 - 5.81 Mil/uL   Platelets 141.0 (L) 150 - 400 K/uL   Hemoglobin 14.7 13.0 - 17.0 g/dL   HCT 44.0 39 - 52 %   MCV 91.7 78.0 - 100.0 fl   MCHC 33.4 30.0 - 36.0 g/dL   RDW 14.3 11.5 - 15.5 %  Comp Met (CMET)     Status: Abnormal   Collection Time: 05/31/20  9:06 AM  Result Value Ref Range   Sodium 139 135 - 145 mEq/L   Potassium 4.0 3.5 - 5.1 mEq/L   Chloride 102 96 - 112 mEq/L   CO2 29 19 - 32 mEq/L   Glucose, Bld 106 (H) 70 - 99 mg/dL   BUN  16 6 - 23 mg/dL   Creatinine, Ser 0.94 0.40 - 1.50 mg/dL   Total Bilirubin 0.9 0.2 - 1.2 mg/dL   Alkaline Phosphatase 55 39 - 117 U/L   AST 21 0 - 37 U/L   ALT 17 0 - 53 U/L   Total Protein 6.8 6.0 - 8.3 g/dL   Albumin 4.6 3.5 - 5.2 g/dL   GFR 80.08 >60.00 mL/min   Calcium 9.7 8.4 - 10.5 mg/dL  Lipid panel     Status: None   Collection Time: 05/31/20  9:06 AM  Result Value Ref Range   Cholesterol 156 0 - 200 mg/dL    Comment: ATP III Classification       Desirable:  < 200 mg/dL               Borderline High:  200 - 239 mg/dL          High:  > = 240 mg/dL   Triglycerides 74.0 0 - 149 mg/dL    Comment: Normal:  <150 mg/dLBorderline High:  150 - 199 mg/dL   HDL 44.40 >39.00 mg/dL   VLDL 14.8 0.0 - 40.0 mg/dL   LDL Cholesterol 97 0 - 99 mg/dL   Total CHOL/HDL Ratio 4     Comment:                Men          Women1/2 Average Risk     3.4          3.3Average Risk  5.0          4.42X Average Risk          9.6          7.13X Average Risk          15.0          11.0                       NonHDL 111.41     Comment: NOTE:  Non-HDL goal should be 30 mg/dL higher than patient's LDL goal (i.e. LDL goal of < 70 mg/dL, would have non-HDL goal of < 100 mg/dL)  TSH     Status: None   Collection Time: 05/31/20  9:06 AM  Result Value Ref Range   TSH 1.18 0.35 - 4.50 uIU/mL  Hemoglobin A1c     Status: None   Collection Time: 05/31/20  9:06 AM  Result Value Ref Range   Hgb A1c MFr Bld 5.8 4.6 - 6.5 %    Comment: Glycemic Control Guidelines for People with Diabetes:Non Diabetic:  <6%Goal of Therapy: <7%Additional Action Suggested:  >8%   PSA, Medicare ( [place] [facility] only)     Status: None   Collection Time: 05/31/20  9:06 AM  Result Value Ref Range   PSA 1.64 0.10 - 4.00 ng/ml    Comment: Test performed using Access Hybritech PSA Assay, a parmagnetic partical, chemiluminecent immunoassay.  Hepatitis C Antibody     Status: None   Collection Time: 05/31/20  9:15 AM  Result Value Ref Range     Hepatitis C Ab NON-REACTIVE NON-REACTI   SIGNAL TO CUT-OFF 0.01 <1.00    Comment: . HCV antibody was non-reactive. There is no laboratory  evidence of HCV infection. . In most cases, no further action is required. However, if recent HCV exposure is suspected, a test for HCV RNA (test code [phone]) is suggested. . For additional information please refer to http://education.questdiagnostics.com/faq/FAQ22v1 (This link is being provided for informational/ educational purposes only.) .       ROS: 14 pt review of systems performed and negative (unless mentioned in an HPI)  Objective: BP 122/79 (BP Location: Left Arm, Patient Position: Sitting, Cuff Size: Normal)   Pulse 81   Temp 98.1 F (36.7 C) (Oral)   Resp 16   Ht 6' 0.84" (1.85 m)   Wt 217 lb 9.6 oz (98.7 kg)   SpO2 99%   BMI 28.84 kg/m  Gen: Afebrile. No acute distress. Nontoxic in appearance, well-developed, well-nourished, pleasant, Caucasian male. HENT: AT. [place]. Bilateral TM visualized and normal in appearance, normal external auditory canal. MMM, no oral lesions, adequate dentition. Bilateral nares within normal limits. Throat without erythema, ulcerations or exudates.  No cough on exam, no hoarseness on exam. Eyes:Pupils Equal Round Reactive to light, Extraocular movements intact,  Conjunctiva without redness, discharge or icterus. Neck/lymp/endocrine: Supple, no lymphadenopathy, no thyromegaly CV: RRR no murmur, no edema, +2/4 P posterior tibialis pulses. No JVD. Chest: CTAB, no wheeze, rhonchi or crackles.  Normal respiratory effort.  Good air movement. Abd: Soft.  Flat. NTND. BS present.  No masses palpated. No hepatosplenomegaly. No rebound tenderness or guarding. Skin: No rashes, purpura or petechiae. Warm and well-perfused. Skin intact. Neuro/Msk:  Normal gait. PERLA. EOMi. Alert. Oriented x3.  Cranial nerves II through XII intact. Muscle strength 5/5 upper/lower extremity. DTRs equal bilaterally. Psych: Normal  affect, dress and demeanor. Normal speech. Normal thought content and judgment.  No exam data present  Assessment/plan: VIDYUTH BELSITO is a 69 y.o. male present for CPE Essential hypertension, benign/dyslipidemia/overweight Stable. -Continue amlodipine 10 mg daily  -Continue lisinopril 10 mg daily -Continue HCTZ. -ContinueZocor 10 mg daily.  -continuefish oil. - continue ASA 81 mg - routine diet and exercise. - CBC - Comp Met (CMET) - Lipid panel - TSH - F/U 5.5 monthswith provider on hypertension.   Gastroesophageal reflux disease with esophagitis Stable Continue Protonix PRN  Anxiety:  Stable Continue lexapro 20 mg qd.   Elevated hemoglobin A1c - Hemoglobin A1c Prostate cancer screening - PSA, Medicare ( [place] only) Need for hepatitis C screening test - Hepatitis C Antibody Encounter for preventative exam: Patient was encouraged to exercise greater than 150 minutes a week. Patient was encouraged to choose a diet filled with fresh fruits and vegetables, and lean meats. AVS provided to patient today for education/recommendation on gender specific health and safety maintenance. Colonoscopy: last screen 2017, r 5yrrecall ; resulted diverticulosis, x3 polyps. Dr. AHavery Moros. Due 06/2022 Immunizations: declines all immunizations. Tdap printed.  Infectious disease screening: Declines HIV screening.  Agreeable to hepatitis C screening. PSA: Collected today.    Return in about 24 weeks (around 11/15/2020) for CMC (30 min) and 1 year CPE.   Orders Placed This Encounter  Procedures  . CBC  . Comp Met (CMET)  . Lipid panel  . TSH  . Hemoglobin A1c  . PSA, Medicare ( [place] [facility] only)  . Hepatitis C Antibody   Meds ordered this encounter  Medications  . amLODipine (NORVASC) 10 MG tablet    Sig: Take 1 tablet (10 mg total) by mouth daily.    Dispense:  90 tablet    Refill:  3    This prescription was filled on 06/19/2019. Any refills  authorized will be placed on file.  .Marland Kitchenlisinopril (ZESTRIL) 10 MG tablet    Sig: Take 1 tablet (10 mg total) by mouth daily.    Dispense:  90 tablet    Refill:  1  . pantoprazole (PROTONIX) 40 MG tablet    Sig: Take 1 tablet (40 mg total) by mouth daily.    Dispense:  90 tablet    Refill:  3    This prescription was filled on 06/19/2019. Any refills authorized will be placed on file.  . simvastatin (ZOCOR) 10 MG tablet    Sig: TAKE ONE TABLET BY MOUTH EVERY OTHER DAY    Dispense:  15 tablet    Refill:  11    This prescription was filled on 06/19/2019. Any refills authorized will be placed on file.  . escitalopram (LEXAPRO) 20 MG tablet    Sig: Take 1 tablet (20 mg total) by mouth daily.    Dispense:  90 tablet    Refill:  3    This prescription was filled on 05/12/2019. Any refills authorized will be placed on file.  . hydrochlorothiazide (HYDRODIURIL) 25 MG tablet    Sig: Take 1 tablet (25 mg total) by mouth daily.    Dispense:  90 tablet    Refill:  3    This prescription was filled on 03/11/2019. Any refills authorized will be placed on file.  . Tdap (BOOSTRIX) 5-2.5-18.5 LF-MCG/0.5 injection    Sig: Inject 0.5 mLs into the muscle once for 1 dose.    Dispense:  0.5 mL    Refill:  0   Referral Orders  No referral(s) requested today    Complete physical preventative exam completed today, as well as>  57 Minutes was dedicated to this patient's encounter to include pre-visit review of chart, face-to-face time with patient and post-visit work- which include documentation and prescribing medications and/or ordering test when necessary for preventative exam and multiple chronic medical condition follow-ups.  Note is dictated utilizing voice recognition software. Although note has been proof read prior to signing, occasional typographical errors still can be missed. If any questions arise, please do not hesitate to call for verification.  Electronically signed by: Howard Pouch, DO [place]

## 2020-06-01 LAB — HEPATITIS C ANTIBODY
Hepatitis C Ab: NONREACTIVE
SIGNAL TO CUT-OFF: 0.01 (ref <1.00)

## 2020-06-06 ENCOUNTER — Encounter: Payer: Self-pay | Admitting: Family Medicine

## 2020-06-17 DIAGNOSIS — U071 COVID-19: Secondary | ICD-10-CM

## 2020-06-17 HISTORY — DX: COVID-19: U07.1

## 2020-06-20 ENCOUNTER — Encounter: Payer: Self-pay | Admitting: Family Medicine

## 2020-06-20 ENCOUNTER — Telehealth (INDEPENDENT_AMBULATORY_CARE_PROVIDER_SITE_OTHER): Payer: PPO | Admitting: Family Medicine

## 2020-06-20 ENCOUNTER — Other Ambulatory Visit: Payer: Self-pay

## 2020-06-20 DIAGNOSIS — R059 Cough, unspecified: Secondary | ICD-10-CM | POA: Diagnosis not present

## 2020-06-20 DIAGNOSIS — G8929 Other chronic pain: Secondary | ICD-10-CM

## 2020-06-20 DIAGNOSIS — R509 Fever, unspecified: Secondary | ICD-10-CM

## 2020-06-20 DIAGNOSIS — M5134 Other intervertebral disc degeneration, thoracic region: Secondary | ICD-10-CM | POA: Diagnosis not present

## 2020-06-20 DIAGNOSIS — M546 Pain in thoracic spine: Secondary | ICD-10-CM

## 2020-06-20 DIAGNOSIS — M5136 Other intervertebral disc degeneration, lumbar region: Secondary | ICD-10-CM | POA: Diagnosis not present

## 2020-06-20 DIAGNOSIS — M5090 Cervical disc disorder, unspecified, unspecified cervical region: Secondary | ICD-10-CM

## 2020-06-20 DIAGNOSIS — Z9889 Other specified postprocedural states: Secondary | ICD-10-CM | POA: Diagnosis not present

## 2020-06-20 MED ORDER — BACLOFEN 20 MG PO TABS: 10.0000 mg | ORAL_TABLET | Freq: Three times a day (TID) | ORAL | 1 refills | Status: DC

## 2020-06-20 MED ORDER — OXYCODONE-ACETAMINOPHEN 10-325 MG PO TABS: 1.0000 | ORAL_TABLET | Freq: Two times a day (BID) | ORAL | 0 refills | Status: DC

## 2020-06-20 MED ORDER — GABAPENTIN 600 MG PO TABS: 600.0000 mg | ORAL_TABLET | Freq: Three times a day (TID) | ORAL | 1 refills | Status: DC

## 2020-06-20 NOTE — Progress Notes (Signed)
VIRTUAL VISIT VIA VIDEO  I connected with Nicholas Graham on 06/20/20 at  8:00 AM EDT by a video enabled telemedicine application and verified that I am speaking with the correct person using two identifiers. Location patient: Home Location provider: [facility], Office Persons participating in the virtual visit: Patient, Dr. Raoul Pitch and Cyndra Numbers, CMA  I discussed the limitations of evaluation and management by telemedicine and the availability of in person appointments. The patient expressed understanding and agreed to proceed.   Patient Care Team    Relationship Specialty Notifications Start End  [name], DO PCP - General Family Medicine  10/31/15   [name], MD (Inactive) Consulting Physician Gastroenterology  04/24/16     SUBJECTIVE Chief Complaint  Patient presents with   Fever    pt c/o body ache, chills, cold sweat, cough, diarrhea, LLQ abdominal pain x 5 days; have not had COVID vaccines     HPI: Nicholas Graham is a 69 y.o. male present for chronic pain management with acute concern Cervical neck pain with evidence of disc disease/Chronic midline thoracic back pain/Thoracic degenerative disc disease/DDD (degenerative disc disease), lumbar/History of lumbar discectomy Pt presents for his chronic pain management treatment for his cervical and lumbar conditions. His discomfort has progressed over the last year- but is better controlled on current meds. He is walking 2-3 miles a day. He has been seen by neuro and pain rehab. He has received injections- that have only provided a few days of temporary relief. He is frustrated, but has appropriate expectations that he will be living with some pain. He is able to maintain some quality of life with the following medications and reports compliance  with Percocet 10-325 (1 tab every 12 hours), baclofen and gabapentin 600/600/600 (had headaches at increased doses..  Indication for chronic opioid: Cervical spine  degeneration, chronic neck and back pain. Medication and dose: Oxycodone-acetaminophen 10-325 1 tab twice daily # pills per month: 60 Last UDS date:10/14/2019 Pain contract signed (Y/N): Yes Date narcotic database last reviewed (include red flags): 06/20/20   Original note: Lower cervical to mid thoracic pain still present. Using his right arm exacerbates his pain midline of thoracic.  He has a history thoracic spine degeneration and discectomy in his lumbar spine.  Has degenerative changes, bone spurring and facet arthropathy in his lumbar spine by x-rays obtained 11/2018.Cervical spine x-ray with mild bilateral foraminal narrowing at C3-C4 11/2018. Unfortunately he was reported as allergic or intolerant to many of the medications that would be helpful for him such as gabapentin, Lyrica, Celebrex and Cymbalta --> we discussed this in more detail last visit and he believes that the "allergy-hallucinations " was secondary to having multiple of these medicines on board at the same time back in 2014. We tried to start gabapentin and it did not have any side effects from medication. It has not been extremely helpful  Yet at lower doses. He admits to rather sigmnificant stomach upset starting from the naproxen BID use ober the last 8 week. Advanced imaging has not bee able to be completed 2/2 to covid outbreak. He desired to wait on referral to specialist last visit in hope that he would receive benefit from the gabapentin.   Covid 19 like illness:  Pt reports he has experience cough, body aches, chills, fatigue, LLQ pain and diarrhea onset of symptoms 5 days. He has tried tylenol and alk cold and sinus. Exposure: no known. H/o of diverticulosis left  colon.  He has not had his covid 19 vaccines.   ROS: See pertinent positives and negatives per HPI.  Patient Active Problem List   Diagnosis Date Noted   GERD (gastroesophageal reflux disease) 06/23/2019   Cervical neck pain with evidence of disc  disease 01/15/2019   Chronic midline thoracic back pain 01/15/2019   Thoracic degenerative disc disease 01/15/2019   History of lumbar discectomy 01/15/2019   DDD (degenerative disc disease), lumbar 01/15/2019   OSA on CPAP 07/04/2018   Overweight (BMI 25.0-29.9) 05/01/2018   Benign prostatic hyperplasia with lower urinary tract symptoms 11/15/2017   Anxiety 09/12/2017   Stopped smoking with greater than 30 pack year history 07/30/2017   Pulmonary lesion 05/16/2017   Alcohol ingestion of more than 4 drinks/week 09/10/2014   Elevated hemoglobin A1c 02/19/2014   Dyslipidemia 02/19/2014   Essential hypertension, benign 02/19/2014   History of colonic polyps 09/23/2009   GERD 12/12/2007    Social History   Tobacco Use   Smoking status: Former Smoker    Packs/day: 3.00    Years: 30.00    Pack years: 90.00    Quit date: 07/16/2002    Years since quitting: 17.9   Smokeless tobacco: Never Used  Substance Use Topics   Alcohol use: Yes    Alcohol/week: 1.0 - 2.0 standard drink    Types: 1 - 2 Cans of beer per week    Comment: socially    Current Outpatient Medications:    amLODipine (NORVASC) 10 MG tablet, Take 1 tablet (10 mg total) by mouth daily., Disp: 90 tablet, Rfl: 3   aspirin 81 MG tablet, Take 81 mg by mouth daily., Disp: , Rfl:    escitalopram (LEXAPRO) 20 MG tablet, Take 1 tablet (20 mg total) by mouth daily., Disp: 90 tablet, Rfl: 3   hydrochlorothiazide (HYDRODIURIL) 25 MG tablet, Take 1 tablet (25 mg total) by mouth daily., Disp: 90 tablet, Rfl: 3   lisinopril (ZESTRIL) 10 MG tablet, Take 1 tablet (10 mg total) by mouth daily., Disp: 90 tablet, Rfl: 1   MegaRed Omega-3 Krill Oil 500 MG CAPS, Take 2 capsules by mouth daily. , Disp: , Rfl:    pantoprazole (PROTONIX) 40 MG tablet, Take 1 tablet (40 mg total) by mouth daily., Disp: 90 tablet, Rfl: 3   simvastatin (ZOCOR) 10 MG tablet, TAKE ONE TABLET BY MOUTH EVERY OTHER DAY, Disp: 15 tablet, Rfl:  11   baclofen (LIORESAL) 20 MG tablet, Take 0.5-1 tablets (10-20 mg total) by mouth 3 (three) times daily., Disp: 270 tablet, Rfl: 1   gabapentin (NEURONTIN) 600 MG tablet, Take 1 tablet (600 mg total) by mouth 3 (three) times daily. 1 tab morning, 1.5 tabs afternoon, 1 tab evening,  doses should be every 8 hours., Disp: 270 tablet, Rfl: 1   oxyCODONE-acetaminophen (PERCOCET) 10-325 MG tablet, Take 1 tablet by mouth every 12 (twelve) hours., Disp: 60 tablet, Rfl: 0  Allergies  Allergen Reactions   Celebrex [Celecoxib]    Cymbalta [Duloxetine Hcl]    Lyrica [Pregabalin]    Penicillins     REACTION: hives    OBJECTIVE: There were no vitals taken for this visit.  Gen: Afebrile. No acute distress.  HENT: AT. [place].  Eyes:Pupils Equal Round Reactive to light, Extraocular movements intact,  Conjunctiva without redness, discharge or icterus. Chest: CTAB, no wheeze or crackles Skin: no rashes, purpura or petechiae.  Neuro: Normal gait. PERLA. EOMi. Alert. Oriented x3  Psych: Normal affect, dress and demeanor. Normal speech. Normal thought  content and judgment.   ASSESSMENT AND PLAN: ERLIN GARDELLA is a 69 y.o. male present for  Cervical neck pain with evidence of disc disease/Chronic midline thoracic back pain/Thoracic degenerative disc disease/DDD (degenerative disc disease), lumbar/History of lumbar discectomy - persistent pain despite treatments though pain rehab. Agreed to over take pain management. Pt counseled on need of face to face encounter every 3 months for pain management alone.  -[place] controlled substance database reviewed and appropriate. - Pain contract signed  - UDS UTD -Patient discontinued Mobic. - continue Gabapentin  600/600/600> did not tolerate higher dose (headache) - continue  baclofen 10-20 mg 3 times daily - continue percocet 10-325 mg BID #60 prescribed with 2 refills (held) at pharmacy.  - f/u 3 mos   Cough/Fever, unspecified fever  cause Rest, hydrate.   mucinex (DM if cough) OTC Can continue alk cold and sinus Caution on tylenol containing products- make sure not going over 3g daily total with pain medicine containing tylenol product also.  Pt was given emergent precautions- monitor for any worsening cough, fever, shortness of breath or Abd pain. Discussed covering LLQ and pain with FQ and flagyl for potential diverticulitis - pt elected to wait since he feels his symptoms are starting to slightly improve.    - if symptoms increase he will call in and describe symptoms- I will call in tx depending upon those symptoms.   - If severely worsen he is to be seen at emergency room or urgent care .    - pt reports understanding.  - he understands to self isolate over this time.  F/U 1 weeks of not improved, sooner if worsening.   - Novel Coronavirus, NAA ([facility]) tested today.     Orders Placed This Encounter  Procedures   Novel Coronavirus, NAA ([facility])   Meds ordered this encounter  Medications   oxyCODONE-acetaminophen (PERCOCET) 10-325 MG tablet    Sig: Take 1 tablet by mouth every 12 (twelve) hours.    Dispense:  60 tablet    Refill:  0   gabapentin (NEURONTIN) 600 MG tablet    Sig: Take 1 tablet (600 mg total) by mouth 3 (three) times daily. 1 tab morning, 1.5 tabs afternoon, 1 tab evening,  doses should be every 8 hours.    Dispense:  270 tablet    Refill:  1    Pt reports not true allergy- was a combination of meds that caused symptoms.   baclofen (LIORESAL) 20 MG tablet    Sig: Take 0.5-1 tablets (10-20 mg total) by mouth 3 (three) times daily.    Dispense:  270 tablet    Refill:  [place], DO 06/20/2020

## 2020-06-20 NOTE — Patient Instructions (Signed)

## 2020-06-22 ENCOUNTER — Telehealth: Payer: Self-pay | Admitting: Family Medicine

## 2020-06-22 ENCOUNTER — Telehealth: Payer: Self-pay

## 2020-06-22 LAB — NOVEL CORONAVIRUS, NAA: SARS-CoV-2, NAA: DETECTED — AB

## 2020-06-22 LAB — SARS-COV-2, NAA 2 DAY TAT

## 2020-06-22 MED ORDER — AZITHROMYCIN 250 MG PO TABS: ORAL_TABLET | ORAL | 0 refills | Status: DC

## 2020-06-22 MED ORDER — PREDNISONE 20 MG PO TABS: ORAL_TABLET | ORAL | 0 refills | Status: DC

## 2020-06-22 MED ORDER — BENZONATATE 100 MG PO CAPS: 200.0000 mg | ORAL_CAPSULE | Freq: Two times a day (BID) | ORAL | 0 refills | Status: DC | PRN

## 2020-06-22 NOTE — Telephone Encounter (Signed)
Phone note was generated earlier today concerning Nicholas Graham Covid test.   I have called in a Z-Pak and prednisone taper. He is to use Mucinex DM over-the-counter.  Unfortunately with his chronic pain meds we cannot prescribe a Hycodan cough syrup for him-since that also contains a similar product.  I have called in [place] to help with cough.   Of note: His wife apparently also tested Covid positive and has symptoms.  There is a phone note also generated concerning treatment for cough for her.

## 2020-06-22 NOTE — Telephone Encounter (Signed)
Please advise, see note from [place].

## 2020-06-22 NOTE — Telephone Encounter (Signed)
Called regarding results of her COVID test and also her husband (telephone note in her chart also).  Both tested positive for COVID.  Is there anything Dr. Raoul Pitch give for cough?  Nicholas Graham states that she is not as bad as her husband but she cant rest without coughing so much.  [name] is worse. Very weak, cough is worse.  He has fever.    Please call [phone]

## 2020-06-22 NOTE — Telephone Encounter (Signed)
Spoke with pt wife per dpr with verified understanding

## 2020-06-22 NOTE — Telephone Encounter (Signed)
Please inform patient the following information: His covid 19 test is positive.  He should remain self isolated 14 days from onset of symptoms, which would be until 10/14. Rest, hydrate. If symptoms are worsening instead of improving he should be seen immediately.  If he experiences shortness of breath he should be seen emergently in the ED.  Please ask him how he is feeling?    -If he is not seeing improvement, I can call in a Z-Pak and prednisone. -If he is worsening we can see if we can get him in for infusion at the infusion clinic.  Thanks

## 2020-06-22 NOTE — Addendum Note (Signed)
Addended by: Howard Pouch A on: 06/22/2020 05:08 PM   Modules accepted: Orders

## 2020-06-22 NOTE — Addendum Note (Signed)
Addended by: Howard Pouch A on: 06/22/2020 05:04 PM   Modules accepted: Orders

## 2020-06-27 ENCOUNTER — Telehealth: Payer: Self-pay

## 2020-06-27 ENCOUNTER — Ambulatory Visit (HOSPITAL_COMMUNITY)
Admission: RE | Admit: 2020-06-27 | Discharge: 2020-06-27 | Disposition: A | Discharge status: Home / Self Care | Payer: PPO | Source: Ambulatory Visit | Attending: Pulmonary Disease | Admitting: Pulmonary Disease

## 2020-06-27 VITALS — BP 124/65 | HR 75 | Temp 98.4°F | Resp 20

## 2020-06-27 DIAGNOSIS — U071 COVID-19: Secondary | ICD-10-CM | POA: Insufficient documentation

## 2020-06-27 MED ORDER — SODIUM CHLORIDE 0.9 % IV SOLN: Freq: Once | INTRAVENOUS | Status: AC

## 2020-06-27 NOTE — Progress Notes (Signed)
1 liter of fluids given to patient IV during infusion. Patient tolerated fluids well and no complications. Will have patient follow-up with post-covid clinic.

## 2020-06-27 NOTE — Telephone Encounter (Signed)
Patient wife calling (DPR) Patient is on 11th day of COVID.  She states that she and her husband have COVID. (added note to her chart also) She states that he is worse now, he cant get any sleep with coughing. Does he qualify for infusion or covid clinic?  She is begging for help for both of them. Patient aware Dr. Raoul Pitch out of office until 10/13.   Please call wife at [phone]

## 2020-06-27 NOTE — Telephone Encounter (Signed)
Called pt's wife (DPR) and left voicemail for them to call post-covid clinic. Also sent MyChart message with number. [phone]

## 2020-06-27 NOTE — Discharge Instructions (Signed)
10 Things You Can Do to Manage Your COVID-19 Symptoms at Home If you have possible or confirmed COVID-19: 1. Stay home from work and school. And stay away from other public places. If you must go out, avoid using any kind of public transportation, ridesharing, or taxis. 2. Monitor your symptoms carefully. If your symptoms get worse, call your healthcare provider immediately. 3. Get rest and stay hydrated. 4. If you have a medical appointment, call the healthcare provider ahead of time and tell them that you have or may have COVID-19. 5. For medical emergencies, call 911 and notify the dispatch personnel that you have or may have COVID-19. 6. Cover your cough and sneezes with a tissue or use the inside of your elbow. 7. Wash your hands often with soap and water for at least 20 seconds or clean your hands with an alcohol-based hand sanitizer that contains at least 60% alcohol. 8. As much as possible, stay in a specific room and away from other people in your home. Also, you should use a separate bathroom, if available. If you need to be around other people in or outside of the home, wear a mask. 9. Avoid sharing personal items with other people in your household, like dishes, towels, and bedding. 10. Clean all surfaces that are touched often, like counters, tabletops, and doorknobs. Use household cleaning sprays or wipes according to the label instructions. cdc.gov/coronavirus 03/18/2019 This information is not intended to replace advice given to you by your health care provider. Make sure you discuss any questions you have with your health care provider. Document Revised: 08/20/2019 Document Reviewed: 08/20/2019 Elsevier Patient Education  2020 Elsevier Inc.  

## 2020-06-27 NOTE — Telephone Encounter (Signed)
Pt is past the 10 day period for infusion clinic.

## 2020-06-28 NOTE — Telephone Encounter (Signed)
Pt did receive IV fluids at the infusion clinic.

## 2020-07-04 ENCOUNTER — Telehealth: Payer: Self-pay

## 2020-07-04 NOTE — Telephone Encounter (Signed)
Noted  

## 2020-07-04 NOTE — Telephone Encounter (Signed)
Please advise, pt requesting something to help him sleep.

## 2020-07-04 NOTE — Telephone Encounter (Signed)
Patient is still getting over COVID and cannot sleep.  He is requesting something to help him sleep.  I had no available appts to offer patient until next week.  Please call [phone]

## 2020-07-04 NOTE — Telephone Encounter (Signed)
Pt is wanting to be seen for sleep. Pt declines VV tomorrow due to wife having surgery. Pt has intermediate cough as his only sx. Pt sched for next avail 07/12/20. FYI

## 2020-07-04 NOTE — Telephone Encounter (Signed)
I can work him in at [facility] as a Hospital doctor (double book slot).

## 2020-07-07 NOTE — Telephone Encounter (Signed)
Okay for change to in person?

## 2020-07-07 NOTE — Telephone Encounter (Signed)
Pt appt changed to in person visit

## 2020-07-07 NOTE — Telephone Encounter (Signed)
Pt.notified

## 2020-07-07 NOTE — Telephone Encounter (Signed)
Yes. In person visit is ok.

## 2020-07-07 NOTE — Telephone Encounter (Signed)
Patient has Mychart video visit scheduled for 07/12/20. He was covid pos 06/22/20 and states he is feeling better now. Patient would like this visit 10/26 to be in person. Please advise.

## 2020-07-12 ENCOUNTER — Other Ambulatory Visit: Payer: Self-pay

## 2020-07-12 ENCOUNTER — Ambulatory Visit (INDEPENDENT_AMBULATORY_CARE_PROVIDER_SITE_OTHER): Payer: PPO | Admitting: Family Medicine

## 2020-07-12 ENCOUNTER — Encounter: Payer: Self-pay | Admitting: Family Medicine

## 2020-07-12 VITALS — BP 119/75 | HR 88 | Temp 98.1°F | Ht 74.0 in | Wt 201.0 lb

## 2020-07-12 DIAGNOSIS — Z114 Encounter for screening for human immunodeficiency virus [HIV]: Secondary | ICD-10-CM

## 2020-07-12 DIAGNOSIS — G4733 Obstructive sleep apnea (adult) (pediatric): Secondary | ICD-10-CM | POA: Diagnosis not present

## 2020-07-12 DIAGNOSIS — Z1159 Encounter for screening for other viral diseases: Secondary | ICD-10-CM

## 2020-07-12 DIAGNOSIS — G479 Sleep disorder, unspecified: Secondary | ICD-10-CM

## 2020-07-12 DIAGNOSIS — Z9989 Dependence on other enabling machines and devices: Secondary | ICD-10-CM | POA: Diagnosis not present

## 2020-07-12 DIAGNOSIS — U071 COVID-19: Secondary | ICD-10-CM | POA: Diagnosis not present

## 2020-07-12 NOTE — Patient Instructions (Signed)
I am glad you both are doing better.

## 2020-07-12 NOTE — Progress Notes (Signed)
This visit occurred during the SARS-CoV-2 public health emergency.  Safety protocols were in place, including screening questions prior to the visit, additional usage of staff PPE, and extensive cleaning of exam room while observing appropriate contact time as indicated for disinfecting solutions.    Nicholas Graham , [birth year], 69 y.o., male MRN: [id number] Patient Care Team    Relationship Specialty Notifications Start End  [name], DO PCP - General Family Medicine  10/31/15   [name], MD (Inactive) Consulting Physician Gastroenterology  04/24/16     Chief Complaint  Patient presents with  . Sleeping Problem    pt states improved with use of CPAP     Subjective: Pt presents for an OV with complaints of difficulty sleeping over the last few weeks.  He recently had Covid 19 infection.  He reports he is slowly starting to recover from his COVID-19 infection.  He lost 17 pounds during his illness.  He is starting to tolerate p.o. better.  He denies any ongoing symptoms.  He started wearing his CPAP again the last 2 nights and his sleep has greatly improved with the use of his CPAP.  Depression screen [facility] 2/9 07/12/2020 05/31/2020 12/16/2019 06/23/2019 06/19/2018  Decreased Interest 0 0 0 0 0  Down, Depressed, Hopeless 0 0 0 0 0  PHQ - 2 Score 0 0 0 0 0  Altered sleeping - - - - -  Tired, decreased energy - - - - -  Change in appetite - - - - -  Feeling bad or failure about yourself  - - - - -  Trouble concentrating - - - - -  Moving slowly or fidgety/restless - - - - -  Suicidal thoughts - - - - -  PHQ-9 Score - - - - -  Difficult doing work/chores - - - - -    Allergies  Allergen Reactions  . Celebrex [Celecoxib]   . Cymbalta [Duloxetine Hcl]   . Lyrica [Pregabalin]   . Penicillins     REACTION: hives   Social History   Social History Narrative   Married, Arts administrator. Children (2) Adult, 4 grandchildren.    9 th grade, Retired.   Wears seatbelt   Smoke  detector in the home.    Firearms locked in the home.    Feels safe in his relationships.    Past Medical History:  Diagnosis Date  . Arm fracture    left. Dx'd w/RSD post fracture  . Carpal tunnel syndrome   . Chest pain 2016   [facility] (Dr. Matthew Saras) doing stress echo, but feels like musculoskeletal chest wall pain is cause of his discomfort  . Chronic back pain   . Deviated septum   . Diverticulosis   . GERD (gastroesophageal reflux disease)   . Hyperlipidemia   . Hypertension   . Joint pain   . Muscle pain   . Nephrolithiasis   . OSA on CPAP 07/04/2018   - Trial of CPAP therapy on 17 cm H2O with a Large size Fisher[employer]Paykel Full Face Mask Simplus mask and heated humidification.  . Plantar fasciitis   . Pulmonary nodule 04/2016   [place].  Pt chose to repeat CT chest w/out contrast in 3 mo---as per radiologist's recommendations.  . Right inguinal hernia    fat-containing.  Small hydrocele in left hemiscrotum.  . Snoring   . Swelling    of legs/feet/hands  . Tobacco dependence in remission    Past Surgical  History:  Procedure Laterality Date  . COLONOSCOPY    . fracture repair left arm     had a fixator  . INGUINAL HERNIA REPAIR Left 2013   repeat hernia repair. x2  . KNEE ARTHROSCOPY Right   . [place] SURGERY  1993  . NEURECTOMY inguinal after hernia repair Left   . PLANTAR FASCIA SURGERY Right 2000s  . UPPER GI ENDOSCOPY     Family History  Problem Relation Age of Onset  . Lung cancer Mother   . Lung cancer Father   . Bone cancer Brother        bone  . Heart disease Maternal Uncle   . Colon cancer Neg Hx    Allergies as of 07/12/2020      Reactions   Celebrex [celecoxib]    Cymbalta [duloxetine Hcl]    Lyrica [pregabalin]    Penicillins    REACTION: hives      Medication List       Accurate as of July 12, 2020 11:59 PM. If you have any questions, ask your nurse or doctor.        STOP taking these medications   azithromycin 250  MG tablet Commonly known as: ZITHROMAX Stopped by: Howard Pouch, DO   benzonatate 100 MG capsule Commonly known as: TESSALON Stopped by: Howard Pouch, DO   predniSONE 20 MG tablet Commonly known as: DELTASONE Stopped by: Howard Pouch, DO     TAKE these medications   amLODipine 10 MG tablet Commonly known as: NORVASC Take 1 tablet (10 mg total) by mouth daily.   aspirin 81 MG tablet Take 81 mg by mouth daily.   baclofen 20 MG tablet Commonly known as: LIORESAL Take 0.5-1 tablets (10-20 mg total) by mouth 3 (three) times daily.   escitalopram 20 MG tablet Commonly known as: LEXAPRO Take 1 tablet (20 mg total) by mouth daily.   gabapentin 600 MG tablet Commonly known as: NEURONTIN Take 1 tablet (600 mg total) by mouth 3 (three) times daily. 1 tab morning, 1.5 tabs afternoon, 1 tab evening,  doses should be every 8 hours.   hydrochlorothiazide 25 MG tablet Commonly known as: HYDRODIURIL Take 1 tablet (25 mg total) by mouth daily.   lisinopril 10 MG tablet Commonly known as: ZESTRIL Take 1 tablet (10 mg total) by mouth daily.   MegaRed Omega-3 Krill Oil 500 MG Caps Take 2 capsules by mouth daily.   oxyCODONE-acetaminophen 10-325 MG tablet Commonly known as: PERCOCET Take 1 tablet by mouth every 12 (twelve) hours.   pantoprazole 40 MG tablet Commonly known as: PROTONIX Take 1 tablet (40 mg total) by mouth daily.   simvastatin 10 MG tablet Commonly known as: ZOCOR TAKE ONE TABLET BY MOUTH EVERY OTHER DAY       All past medical history, surgical history, allergies, family history, immunizations andmedications were updated in the EMR today and reviewed under the history and medication portions of their EMR.     ROS: Negative, with the exception of above mentioned in HPI   Objective:  BP 119/75   Pulse 88   Temp 98.1 F (36.7 C) (Oral)   Ht 6\' 2"  (1.88 m)   Wt 201 lb (91.2 kg)   SpO2 94%   BMI 25.81 kg/m  Body mass index is 25.81 kg/m. Gen: Afebrile.  No acute distress. Nontoxic in appearance, well developed, well nourished.  HENT: AT. .  No cough.  No hoarseness.  No shortness of breath Eyes:Pupils Equal Round Reactive to  light, Extraocular movements intact,  Conjunctiva without redness, discharge or icterus. Neck/lymp/endocrine: Supple, no lymphadenopathy CV: RRR no murmur, no edema Chest: CTAB, no wheeze or crackles. Good air movement, normal resp effort.   Neuro:  Normal gait. PERLA. EOMi. Alert. Oriented x3  Psych: Normal affect, dress and demeanor. Normal speech. Normal thought content and judgment.  No exam data present No results found. No results found for this or any previous visit (from the past 24 hour(s)).  Assessment/Plan: MATVEY LLANAS is a 69 y.o. male present for OV for  COVID-19 virus infection/Sleep disturbance/OSA on CPAP Recovering from COVID-19.  He did lose 17 pounds during his illness.  He states he is now tolerating p.o. better. Originally reported sleep disturbance has resolved now that he can go back to wearing his CPAP. Follow-up as needed   Reviewed expectations re: course of current medical issues.  Discussed self-management of symptoms.  Outlined signs and symptoms indicating need for more acute intervention.  Patient verbalized understanding and all questions were answered.  Patient received an After-Visit Summary.    No orders of the defined types were placed in this encounter.  No orders of the defined types were placed in this encounter.  Referral Orders  No referral(s) requested today     Note is dictated utilizing voice recognition software. Although note has been proof read prior to signing, occasional typographical errors still can be missed. If any questions arise, please do not hesitate to call for verification.   electronically signed by:  Howard Pouch, DO  [place]

## 2020-07-13 ENCOUNTER — Encounter: Payer: Self-pay | Admitting: Family Medicine

## 2020-08-05 ENCOUNTER — Other Ambulatory Visit: Payer: Self-pay | Admitting: Family Medicine

## 2020-08-05 DIAGNOSIS — M5090 Cervical disc disorder, unspecified, unspecified cervical region: Secondary | ICD-10-CM

## 2020-08-05 NOTE — Telephone Encounter (Signed)
Requesting: Percocet Contract:10/14/19 UDS:10/14/19 Last Visit:07/12/20 Next Visit:09/12/20 Last Refill:06/20/20(60,0)  Please Advise. Medication pending. Spoke with patient and he has enough to last until Monday.

## 2020-08-08 ENCOUNTER — Telehealth: Payer: Self-pay | Admitting: Family Medicine

## 2020-08-08 DIAGNOSIS — M5090 Cervical disc disorder, unspecified, unspecified cervical region: Secondary | ICD-10-CM

## 2020-08-08 MED ORDER — OXYCODONE-ACETAMINOPHEN 10-325 MG PO TABS
1.0000 | ORAL_TABLET | Freq: Two times a day (BID) | ORAL | 0 refills | Status: DC
Start: 2020-08-08 — End: 2020-09-12

## 2020-08-08 NOTE — Telephone Encounter (Signed)
Pt wife aware

## 2020-08-08 NOTE — Telephone Encounter (Signed)
Please call pt and inform him I called in 2 more scripts (2 months) for his pain medications. I am not certain why the others did not go through at the time of his appt, but I apologize.

## 2020-08-08 NOTE — Telephone Encounter (Signed)
Patient was made aware refill sent. °

## 2020-08-17 ENCOUNTER — Telehealth: Payer: Self-pay | Admitting: Pulmonary Disease

## 2020-08-17 NOTE — Telephone Encounter (Signed)
Called and spoke with pts wife and she stated that the pt is using his cpap every single night and is having to wake up around 3 am to refill the reservoir to fill this up again and by the time he gets up around 6 ish he has to fill this up again.  They have not reached out to [employer] but I advised that they do this.  I told her that I would send this over to [facility] for any advise that he may have.  RA please advise. Thanks

## 2020-08-18 NOTE — Telephone Encounter (Signed)
May simply be related to the humidity in his bedroom since due to the cold everybody has their heater turned off. Defer to DME if they have any other ideas

## 2020-08-18 NOTE — Telephone Encounter (Signed)
Called and spoke with wife, She states they called [employer] and they told him to shave his beard. Patient is not going to do that. I shared Dr. Bari Mantis recommendations. They voiced understanding, nothing further needed at this time.

## 2020-09-01 ENCOUNTER — Other Ambulatory Visit: Payer: Self-pay | Admitting: Family Medicine

## 2020-09-12 ENCOUNTER — Encounter: Payer: Self-pay | Admitting: Family Medicine

## 2020-09-12 ENCOUNTER — Other Ambulatory Visit: Payer: Self-pay

## 2020-09-12 ENCOUNTER — Ambulatory Visit (INDEPENDENT_AMBULATORY_CARE_PROVIDER_SITE_OTHER): Payer: PPO | Admitting: Family Medicine

## 2020-09-12 VITALS — BP 105/62 | HR 83 | Temp 97.8°F | Ht 74.0 in | Wt 214.0 lb

## 2020-09-12 DIAGNOSIS — Z9889 Other specified postprocedural states: Secondary | ICD-10-CM | POA: Diagnosis not present

## 2020-09-12 DIAGNOSIS — Z0289 Encounter for other administrative examinations: Secondary | ICD-10-CM | POA: Diagnosis not present

## 2020-09-12 DIAGNOSIS — M5134 Other intervertebral disc degeneration, thoracic region: Secondary | ICD-10-CM | POA: Diagnosis not present

## 2020-09-12 DIAGNOSIS — G8929 Other chronic pain: Secondary | ICD-10-CM

## 2020-09-12 DIAGNOSIS — M5090 Cervical disc disorder, unspecified, unspecified cervical region: Secondary | ICD-10-CM

## 2020-09-12 DIAGNOSIS — M5136 Other intervertebral disc degeneration, lumbar region: Secondary | ICD-10-CM

## 2020-09-12 DIAGNOSIS — M546 Pain in thoracic spine: Secondary | ICD-10-CM

## 2020-09-12 MED ORDER — BACLOFEN 20 MG PO TABS: 10.0000 mg | ORAL_TABLET | Freq: Three times a day (TID) | ORAL | 1 refills | Status: DC

## 2020-09-12 MED ORDER — OXYCODONE-ACETAMINOPHEN 10-325 MG PO TABS: 1.0000 | ORAL_TABLET | Freq: Two times a day (BID) | ORAL | 0 refills | Status: DC

## 2020-09-12 MED ORDER — GABAPENTIN 600 MG PO TABS: 600.0000 mg | ORAL_TABLET | Freq: Three times a day (TID) | ORAL | 1 refills | Status: DC

## 2020-09-12 NOTE — Patient Instructions (Signed)
    Nice to see you today.  I have refilled your medications for you.  Next appt 3 mos for your pain.

## 2020-09-12 NOTE — Progress Notes (Signed)
VIRTUAL VISIT VIA VIDEO  I connected with Nicholas Graham on 09/12/20 at  8:00 AM EST by a video enabled telemedicine application and verified that I am speaking with the correct person using two identifiers. Location patient: Home Location provider: [facility], Office Persons participating in the virtual visit: Patient, Dr. Claiborne Billings and Les Pou, CMA  I discussed the limitations of evaluation and management by telemedicine and the availability of in person appointments. The patient expressed understanding and agreed to proceed.   Patient Care Team    Relationship Specialty Notifications Start End  Natalia Leatherwood, DO PCP - General Family Medicine  10/31/15   Louis Meckel, MD (Inactive) Consulting Physician Gastroenterology  04/24/16     SUBJECTIVE Chief Complaint  Patient presents with  . Follow-up    Pain [facility];     HPI: Nicholas Graham is a 69 y.o. male present for chronic pain management Cervical neck pain with evidence of disc disease/Chronic midline thoracic back pain/Thoracic degenerative disc disease/DDD (degenerative disc disease), lumbar/History of lumbar discectomy Pt presents for his chronic pain management treatment for his cervical and lumbar conditions. His discomfort has progressed since 2020- but is better controlled on current meds.  He has been evaluated  by neuro and pain rehab. He has received injections- that have only provided a few days of temporary relief. He is frustrated, but has appropriate expectations that he will be living with some pain. He is able to maintain some quality of life with the following medications and reports compliance with Percocet 10-325 (1 tab every 12 hours), baclofen and gabapentin. 600/600/600 (had headaches at increased doses). He states he is walking a few miles a day.  Indication for chronic opioid: Cervical spine degeneration, chronic neck and back pain. Medication and dose: Oxycodone-acetaminophen 10-325 1 tab twice  daily # pills per month: 60 Last UDS date:10/14/2019> collected today Pain contract signed (Y/N): Yes Date narcotic database last reviewed (include red flags): 09/12/20   Original note: Lower cervical to mid thoracic pain still present. Using his right arm exacerbates his pain midline of thoracic.  He has a history thoracic spine degeneration and discectomy in his lumbar spine.  Has degenerative changes, bone spurring and facet arthropathy in his lumbar spine by x-rays obtained 11/2018.Cervical spine x-ray with mild bilateral foraminal narrowing at C3-C4 11/2018. Unfortunately he was reported as allergic or intolerant to many of the medications that would be helpful for him such as gabapentin, Lyrica, Celebrex and Cymbalta --> we discussed this in more detail last visit and he believes that the "allergy-hallucinations " was secondary to having multiple of these medicines on board at the same time back in 2014. We tried to start gabapentin and it did not have any side effects from medication. It has not been extremely helpful  Yet at lower doses. He admits to rather sigmnificant stomach upset starting from the naproxen BID use ober the last 8 week. Advanced imaging has not bee able to be completed 2/2 to covid outbreak. He desired to wait on referral to specialist last visit in hope that he would receive benefit from the gabapentin.     ROS: See pertinent positives and negatives per HPI.  Patient Active Problem List   Diagnosis Date Noted  . Pain management contract agreement 09/12/2020  . GERD (gastroesophageal reflux disease) 06/23/2019  . Cervical neck pain with evidence of disc disease 01/15/2019  . Chronic midline thoracic back pain 01/15/2019  . Thoracic degenerative disc disease  01/15/2019  . History of lumbar discectomy 01/15/2019  . DDD (degenerative disc disease), lumbar 01/15/2019  . OSA on CPAP 07/04/2018  . Overweight (BMI 25.0-29.9) 05/01/2018  . Benign prostatic hyperplasia  with lower urinary tract symptoms 11/15/2017  . Anxiety 09/12/2017  . Stopped smoking with greater than 30 pack year history 07/30/2017  . Pulmonary lesion 05/16/2017  . Alcohol ingestion of more than 4 drinks/week 09/10/2014  . Elevated hemoglobin A1c 02/19/2014  . Dyslipidemia 02/19/2014  . Essential hypertension, benign 02/19/2014  . History of colonic polyps 09/23/2009  . GERD 12/12/2007    Social History   Tobacco Use  . Smoking status: Former Smoker    Packs/day: 3.00    Years: 30.00    Pack years: 90.00    Quit date: 07/16/2002    Years since quitting: 18.1  . Smokeless tobacco: Never Used  Substance Use Topics  . Alcohol use: Yes    Alcohol/week: 1.0 - 2.0 standard drink    Types: 1 - 2 Cans of beer per week    Comment: socially    Current Outpatient Medications:  .  amLODipine (NORVASC) 10 MG tablet, Take 1 tablet (10 mg total) by mouth daily., Disp: 90 tablet, Rfl: 3 .  aspirin 81 MG tablet, Take 81 mg by mouth daily., Disp: , Rfl:  .  baclofen (LIORESAL) 20 MG tablet, Take 0.5-1 tablets (10-20 mg total) by mouth 3 (three) times daily., Disp: 270 tablet, Rfl: 1 .  escitalopram (LEXAPRO) 20 MG tablet, Take 1 tablet (20 mg total) by mouth daily., Disp: 90 tablet, Rfl: 3 .  gabapentin (NEURONTIN) 600 MG tablet, Take 1 tablet (600 mg total) by mouth 3 (three) times daily. 1 tab morning, 1.5 tabs afternoon, 1 tab evening,  doses should be every 8 hours., Disp: 270 tablet, Rfl: 1 .  hydrochlorothiazide (HYDRODIURIL) 25 MG tablet, Take 1 tablet (25 mg total) by mouth daily., Disp: 90 tablet, Rfl: 3 .  lisinopril (ZESTRIL) 10 MG tablet, Take 1 tablet (10 mg total) by mouth daily., Disp: 90 tablet, Rfl: 1 .  MegaRed Omega-3 Krill Oil 500 MG CAPS, Take 2 capsules by mouth daily. , Disp: , Rfl:  .  oxyCODONE-acetaminophen (PERCOCET) 10-325 MG tablet, Take 1 tablet by mouth every 12 (twelve) hours., Disp: 60 tablet, Rfl: 0 .  pantoprazole (PROTONIX) 40 MG tablet, Take 1 tablet (40  mg total) by mouth daily., Disp: 90 tablet, Rfl: 3 .  simvastatin (ZOCOR) 10 MG tablet, TAKE ONE TABLET BY MOUTH EVERY OTHER DAY, Disp: 15 tablet, Rfl: 11  Allergies  Allergen Reactions  . Celebrex [Celecoxib]   . Cymbalta [Duloxetine Hcl]   . Lyrica [Pregabalin]   . Penicillins     REACTION: hives    OBJECTIVE: BP 105/62   Pulse 83   Temp 97.8 F (36.6 C) (Oral)   Ht 6\' 2"  (1.88 m)   Wt 214 lb (97.1 kg)   SpO2 99%   BMI 27.48 kg/m   Gen: Afebrile. No acute distress.  HENT: AT. [place].  Eyes:Pupils Equal Round Reactive to light, Extraocular movements intact,  Conjunctiva without redness, discharge or icterus. CV: RRR Chest: CTAB, no wheeze or crackles Skin: no rashes, purpura or petechiae.  Neuro: Normal gait. PERLA. EOMi. Alert. Oriented x3 Psych: Normal affect, dress and demeanor. Normal speech. Normal thought content and judgment.  ASSESSMENT AND PLAN: JARMAL LEWELLING is a 69 y.o. male present for  Cervical neck pain with evidence of disc disease/Chronic midline thoracic back pain/Thoracic degenerative  disc disease/DDD (degenerative disc disease), lumbar/History of lumbar discectomy - persistent pain despite treatments though pain rehab. Agreed to over take pain management. Pt counseled on need of face to face encounter every 3 months for pain management appt only.  - current pain management is helpin gkeep him active and improve QOL.  -[place] controlled substance database reviewed and appropriate 09/12/20  - Pain contract signed > updated 09/12/2020 - UDS > 09/12/2020 completed -Patient discontinued Mobic. - continue Gabapentin  600/600/600> did not tolerate higher dose (headache) - continue  baclofen 10-20 mg 3 times daily - continue percocet 10-325 mg BID #60 prescribed with 2 refills (held) at pharmacy.  - f/u 3 mos     Orders Placed This Encounter  Procedures  . DRUG MONITORING, PANEL 8 WITH CONFIRMATION, URINE   No orders of the defined types were  placed in this encounter.     Howard Pouch, DO 09/12/2020

## 2020-09-14 LAB — DM TEMPLATE

## 2020-09-14 LAB — DRUG MONITORING, PANEL 8 WITH CONFIRMATION, URINE
6 Acetylmorphine: NEGATIVE ng/mL (ref <10)
Alcohol Metabolites: NEGATIVE ng/mL
Amphetamines: NEGATIVE ng/mL (ref <500)
Benzodiazepines: NEGATIVE ng/mL (ref <100)
Buprenorphine, Urine: NEGATIVE ng/mL (ref <5)
Cocaine Metabolite: NEGATIVE ng/mL (ref <150)
Codeine: NEGATIVE ng/mL (ref <50)
Creatinine: 53.3 mg/dL
Hydrocodone: NEGATIVE ng/mL (ref <50)
Hydromorphone: NEGATIVE ng/mL (ref <50)
MDMA: NEGATIVE ng/mL (ref <500)
Marijuana Metabolite: NEGATIVE ng/mL (ref <20)
Morphine: NEGATIVE ng/mL (ref <50)
Norhydrocodone: NEGATIVE ng/mL (ref <50)
Noroxycodone: 1231 ng/mL — ABNORMAL HIGH (ref <50)
Opiates: NEGATIVE ng/mL (ref <100)
Oxidant: NEGATIVE ug/mL
Oxycodone: 815 ng/mL — ABNORMAL HIGH (ref <50)
Oxycodone: POSITIVE ng/mL — AB (ref <100)
Oxymorphone: 708 ng/mL — ABNORMAL HIGH (ref <50)
pH: 7 (ref 4.5–9.0)

## 2020-12-01 ENCOUNTER — Telehealth: Payer: Self-pay

## 2020-12-01 NOTE — Telephone Encounter (Signed)
Pharmacy called to ask if they can refill oxycodone-acetaminophen. In notes the rx is dated further than the other refills. Please advise pt is on day 29/30. Thanks

## 2020-12-02 NOTE — Telephone Encounter (Signed)
Yes, may refill now. I believe that date was a typo.  Thanks.

## 2020-12-02 NOTE — Telephone Encounter (Signed)
Pharmacy called to follow up on prior message informed them that's ok to refill rx

## 2020-12-12 ENCOUNTER — Encounter: Payer: Self-pay | Admitting: Family Medicine

## 2020-12-12 ENCOUNTER — Ambulatory Visit (INDEPENDENT_AMBULATORY_CARE_PROVIDER_SITE_OTHER): Payer: PPO | Admitting: Family Medicine

## 2020-12-12 ENCOUNTER — Other Ambulatory Visit: Payer: Self-pay

## 2020-12-12 VITALS — BP 104/62 | HR 78 | Temp 98.2°F | Ht 74.0 in | Wt 205.0 lb

## 2020-12-12 DIAGNOSIS — Z9889 Other specified postprocedural states: Secondary | ICD-10-CM

## 2020-12-12 DIAGNOSIS — M5136 Other intervertebral disc degeneration, lumbar region: Secondary | ICD-10-CM

## 2020-12-12 DIAGNOSIS — Z0289 Encounter for other administrative examinations: Secondary | ICD-10-CM | POA: Diagnosis not present

## 2020-12-12 DIAGNOSIS — M5090 Cervical disc disorder, unspecified, unspecified cervical region: Secondary | ICD-10-CM

## 2020-12-12 DIAGNOSIS — M546 Pain in thoracic spine: Secondary | ICD-10-CM

## 2020-12-12 DIAGNOSIS — G8929 Other chronic pain: Secondary | ICD-10-CM | POA: Diagnosis not present

## 2020-12-12 DIAGNOSIS — M5134 Other intervertebral disc degeneration, thoracic region: Secondary | ICD-10-CM | POA: Diagnosis not present

## 2020-12-12 MED ORDER — BACLOFEN 20 MG PO TABS: 10.0000 mg | ORAL_TABLET | Freq: Three times a day (TID) | ORAL | 1 refills | Status: DC

## 2020-12-12 MED ORDER — LISINOPRIL 10 MG PO TABS
10.0000 mg | ORAL_TABLET | Freq: Every day | ORAL | 1 refills | Status: DC
Start: 2020-12-12 — End: 2021-03-15

## 2020-12-12 MED ORDER — OXYCODONE-ACETAMINOPHEN 10-325 MG PO TABS: 1.0000 | ORAL_TABLET | Freq: Two times a day (BID) | ORAL | 0 refills | Status: DC

## 2020-12-12 MED ORDER — GABAPENTIN 600 MG PO TABS: 600.0000 mg | ORAL_TABLET | Freq: Three times a day (TID) | ORAL | 1 refills | Status: DC

## 2020-12-12 NOTE — Progress Notes (Signed)
Patient Care Team    Relationship Specialty Notifications Start End  [name], DO PCP - General Family Medicine  10/31/15   [name], MD (Inactive) Consulting Physician Gastroenterology  04/24/16     SUBJECTIVE Chief Complaint  Patient presents with  . Pain Management    HPI: Nicholas Graham is a 70 y.o. male present for chronic pain management Cervical neck pain with evidence of disc disease/Chronic midline thoracic back pain/Thoracic degenerative disc disease/DDD (degenerative disc disease), lumbar/History of lumbar discectomy Pt presents for his chronic pain management treatment for his cervical and lumbar conditions. His discomfort has progressed since 2020- but is better controlled on current meds.  He has been evaluated  by neuro and pain rehab. He has received injections- that have only provided a few days of temporary relief. He is frustrated, but has appropriate expectations that he will be living with some pain. He is able to maintain some quality of life with the following medications and reports compliance  with Percocet 10-325 (1 tab every 12 hours), baclofen and gabapentin. 600/600/600 (had headaches at increased doses). He states he is walking a few miles a day and able to continue work with pain med.  Indication for chronic opioid: Cervical spine degeneration, chronic neck and back pain. Medication and dose: Oxycodone-acetaminophen 10-325 1 tab twice daily # pills per month: 60 Last UDS date:10/14/2019> UTD 2021 Pain contract signed (Y/N): Yes Date narcotic database last reviewed (include red flags): 12/12/20   Original note: Lower cervical to mid thoracic pain still present. Using his right arm exacerbates his pain midline of thoracic.  He has a history thoracic spine degeneration and discectomy in his lumbar spine.  Has degenerative changes, bone spurring and facet arthropathy in his lumbar spine by x-rays obtained 11/2018.Cervical spine x-ray with mild  bilateral foraminal narrowing at C3-C4 11/2018. Unfortunately he was reported as allergic or intolerant to many of the medications that would be helpful for him such as gabapentin, Lyrica, Celebrex and Cymbalta --> we discussed this in more detail last visit and he believes that the "allergy-hallucinations " was secondary to having multiple of these medicines on board at the same time back in 2014. We tried to start gabapentin and it did not have any side effects from medication. It has not been extremely helpful  Yet at lower doses. He admits to rather sigmnificant stomach upset starting from the naproxen BID use ober the last 8 week. Advanced imaging has not bee able to be completed 2/2 to covid outbreak. He desired to wait on referral to specialist last visit in hope that he would receive benefit from the gabapentin.   Hypertension/HLD/overweight: Pt reportsccompliance with amlodipine 10 mg daily, lisinopril 10 mg QD and HCTZ. Patient denies chest pain, shortness of breath, dizziness or lower extremity edema.  Pt takes a daily baby ASA. Pt is prescribed statin. Labs UTD Diet: Low-sodium Exercise: Not exercising as routinely. RF: Hypertension, hyperlipidemia, family history of heart disease. Overweight.  ANXIETY:  Patient reports he feels wellwith use of lexapro 20 Mg qd. 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  Gerd: Patient reports he is still requiring PPI daily use in order to avoid symptoms.  ROS: See pertinent positives and negatives per HPI.  Patient Active Problem List   Diagnosis Date Noted  . Pain management contract agreement 09/12/2020  . GERD (gastroesophageal reflux disease) 06/23/2019  . Cervical neck pain with evidence of disc disease 01/15/2019  .  Chronic midline thoracic back pain 01/15/2019  . Thoracic degenerative disc disease 01/15/2019  . History of lumbar discectomy 01/15/2019  . DDD (degenerative disc disease), lumbar 01/15/2019  .  OSA on CPAP 07/04/2018  . Overweight (BMI 25.0-29.9) 05/01/2018  . Benign prostatic hyperplasia with lower urinary tract symptoms 11/15/2017  . Anxiety 09/12/2017  . Stopped smoking with greater than 30 pack year history 07/30/2017  . Pulmonary lesion 05/16/2017  . Alcohol ingestion of more than 4 drinks/week 09/10/2014  . Elevated hemoglobin A1c 02/19/2014  . Dyslipidemia 02/19/2014  . Essential hypertension, benign 02/19/2014  . History of colonic polyps 09/23/2009  . GERD 12/12/2007    Social History   Tobacco Use  . Smoking status: Former Smoker    Packs/day: 3.00    Years: 30.00    Pack years: 90.00    Quit date: 07/16/2002    Years since quitting: 18.4  . Smokeless tobacco: Never Used  Substance Use Topics  . Alcohol use: Yes    Alcohol/week: 1.0 - 2.0 standard drink    Types: 1 - 2 Cans of beer per week    Comment: socially    Current Outpatient Medications:  .  amLODipine (NORVASC) 10 MG tablet, Take 1 tablet (10 mg total) by mouth daily., Disp: 90 tablet, Rfl: 3 .  aspirin 81 MG tablet, Take 81 mg by mouth daily., Disp: , Rfl:  .  escitalopram (LEXAPRO) 20 MG tablet, Take 1 tablet (20 mg total) by mouth daily., Disp: 90 tablet, Rfl: 3 .  hydrochlorothiazide (HYDRODIURIL) 25 MG tablet, Take 1 tablet (25 mg total) by mouth daily., Disp: 90 tablet, Rfl: 3 .  MegaRed Omega-3 Krill Oil 500 MG CAPS, Take 2 capsules by mouth daily. , Disp: , Rfl:  .  oxyCODONE-acetaminophen (PERCOCET) 10-325 MG tablet, Take 1 tablet by mouth every 12 (twelve) hours., Disp: 60 tablet, Rfl: 0 .  oxyCODONE-acetaminophen (PERCOCET) 10-325 MG tablet, Take 1 tablet by mouth every 12 (twelve) hours., Disp: 60 tablet, Rfl: 0 .  pantoprazole (PROTONIX) 40 MG tablet, Take 1 tablet (40 mg total) by mouth daily., Disp: 90 tablet, Rfl: 3 .  simvastatin (ZOCOR) 10 MG tablet, TAKE ONE TABLET BY MOUTH EVERY OTHER DAY, Disp: 15 tablet, Rfl: 11 .  baclofen (LIORESAL) 20 MG tablet, Take 0.5-1 tablets (10-20 mg  total) by mouth 3 (three) times daily., Disp: 270 tablet, Rfl: 1 .  gabapentin (NEURONTIN) 600 MG tablet, Take 1 tablet (600 mg total) by mouth 3 (three) times daily. 1 tab morning, 1.5 tabs afternoon, 1 tab evening,  doses should be every 8 hours., Disp: 270 tablet, Rfl: 1 .  lisinopril (ZESTRIL) 10 MG tablet, Take 1 tablet (10 mg total) by mouth daily., Disp: 90 tablet, Rfl: 1 .  oxyCODONE-acetaminophen (PERCOCET) 10-325 MG tablet, Take 1 tablet by mouth every 12 (twelve) hours., Disp: 60 tablet, Rfl: 0  Allergies  Allergen Reactions  . Celebrex [Celecoxib]   . Cymbalta [Duloxetine Hcl]   . Lyrica [Pregabalin]   . Penicillins     REACTION: hives    OBJECTIVE: BP 104/62   Pulse 78   Temp 98.2 F (36.8 C) (Oral)   Ht 6\' 2"  (1.88 m)   Wt 205 lb (93 kg)   SpO2 98%   BMI 26.32 kg/m   Gen: Afebrile. No acute distress. Nontoxic. pleasant male.  HENT: AT. [place].  Eyes:Pupils Equal Round Reactive to light, Extraocular movements intact,  Conjunctiva without redness, discharge or icterus. Neck/lymp/endocrine: Supple,no lymphadenopathy, no thyromegaly CV: RRR  no murmur, no edema, +2/4 P posterior tibialis pulses Chest: CTAB, no wheeze or crackles.  MSK(cervical spine): cervical ROM decreased BSB and rotation.  Skin: no rashes, purpura or petechiae.  Neuro:  Normal gait. PERLA. EOMi. Alert. Orientedx3 Psych: Normal affect, dress and demeanor. Normal speech. Normal thought content and judgment.    ASSESSMENT AND PLAN: Nicholas Graham is a 70 y.o. male present for  Cervical neck pain with evidence of disc disease/Chronic midline thoracic back pain/Thoracic degenerative disc disease/DDD (degenerative disc disease), lumbar/History of lumbar discectomy - persistent pain despite treatments though pain rehab. Agreed to over take pain management. Pt counseled on need of face to face encounter every 3 months for pain management appt only.  - current pain management is helpin gkeep him active and  improve QOL.  -[place] controlled substance database reviewed and appropriate 12/12/20  - Pain contract signed > updated 09/12/2020 - UDS > 09/12/2020 completed -Patient discontinued Mobic. - continue Gabapentin  600/600/600> did not tolerate higher dose (headache) - continue  baclofen 10-20 mg 3 times daily - continue  percocet 10-325 mg BID #60 prescribed with 2 refills (held) at pharmacy.  - f/u 3 mos  Essential hypertension, benign/dyslipidemia/overweight stable -continueamlodipine 10 mg daily -continue  lisinopril 10 mg daily -continueHCTZ. -ContinueZocor 10 mg daily.  -continuefish oil. - continue ASA 81 mg - routine diet and exercise. - F/U 5.5 monthswith provider on hypertension > labs will be due Sept 2022.  Gastroesophageal reflux disease with esophagitis stable Continue Protonix PRN  Anxiety: stable continuelexapro 20 mg qd.    No orders of the defined types were placed in this encounter.  Meds ordered this encounter  Medications  . baclofen (LIORESAL) 20 MG tablet    Sig: Take 0.5-1 tablets (10-20 mg total) by mouth 3 (three) times daily.    Dispense:  270 tablet    Refill:  1  . gabapentin (NEURONTIN) 600 MG tablet    Sig: Take 1 tablet (600 mg total) by mouth 3 (three) times daily. 1 tab morning, 1.5 tabs afternoon, 1 tab evening,  doses should be every 8 hours.    Dispense:  270 tablet    Refill:  1    Pt reports not true allergy- was a combination of meds that caused symptoms.  Marland Kitchen lisinopril (ZESTRIL) 10 MG tablet    Sig: Take 1 tablet (10 mg total) by mouth daily.    Dispense:  90 tablet    Refill:  1  . oxyCODONE-acetaminophen (PERCOCET) 10-325 MG tablet    Sig: Take 1 tablet by mouth every 12 (twelve) hours.    Dispense:  60 tablet    Refill:  0    May refill after 12/20/2020  . oxyCODONE-acetaminophen (PERCOCET) 10-325 MG tablet    Sig: Take 1 tablet by mouth every 12 (twelve) hours.    Dispense:  60 tablet    Refill:  0     May refill after 01/15/2021  . oxyCODONE-acetaminophen (PERCOCET) 10-325 MG tablet    Sig: Take 1 tablet by mouth every 12 (twelve) hours.    Dispense:  60 tablet    Refill:  0    May refill after 02/09/2021      Howard Pouch, DO 12/12/2020

## 2020-12-12 NOTE — Patient Instructions (Signed)
Great to see you today. Preform stretches.  If you decide you want to try PT> we can discuss next appt.     Neck Exercises Ask your health care provider which exercises are safe for you. Do exercises exactly as told by your health care provider and adjust them as directed. It is normal to feel mild stretching, pulling, tightness, or discomfort as you do these exercises. Stop right away if you feel sudden pain or your pain gets worse. Do not begin these exercises until told by your health care provider. Neck exercises can be important for many reasons. They can improve strength and maintain flexibility in your neck, which will help your upper back and prevent neck pain. Stretching exercises Rotation neck stretching 1. Sit in a chair or stand up. 2. Place your feet flat on the floor, shoulder width apart. 3. Slowly turn your head (rotate) to the right until a slight stretch is felt. Turn it all the way to the right so you can look over your right shoulder. Do not tilt or tip your head. 4. Hold this position for 10-30 seconds. 5. Slowly turn your head (rotate) to the left until a slight stretch is felt. Turn it all the way to the left so you can look over your left shoulder. Do not tilt or tip your head. 6. Hold this position for 10-30 seconds. Repeat __________ times. Complete this exercise __________ times a day.   Neck retraction 1. Sit in a sturdy chair or stand up. 2. Look straight ahead. Do not bend your neck. 3. Use your fingers to push your chin backward (retraction). Do not bend your neck for this movement. Continue to face straight ahead. If you are doing the exercise properly, you will feel a slight sensation in your throat and a stretch at the back of your neck. 4. Hold the stretch for 1-2 seconds. Repeat __________ times. Complete this exercise __________ times a day. Strengthening exercises Neck press 1. Lie on your back on a firm bed or on the floor with a pillow under your  head. 2. Use your neck muscles to push your head down on the pillow and straighten your spine. 3. Hold the position as well as you can. Keep your head facing up (in a neutral position) and your chin tucked. 4. Slowly count to 5 while holding this position. Repeat __________ times. Complete this exercise __________ times a day. Isometrics These are exercises in which you strengthen the muscles in your neck while keeping your neck still (isometrics). 1. Sit in a supportive chair and place your hand on your forehead. 2. Keep your head and face facing straight ahead. Do not flex or extend your neck while doing isometrics. 3. Push forward with your head and neck while pushing back with your hand. Hold for 10 seconds. 4. Do the sequence again, this time putting your hand against the back of your head. Use your head and neck to push backward against the hand pressure. 5. Finally, do the same exercise on either side of your head, pushing sideways against the pressure of your hand. Repeat __________ times. Complete this exercise __________ times a day. Prone head lifts 1. Lie face-down (prone position), resting on your elbows so that your chest and upper back are raised. 2. Start with your head facing downward, near your chest. Position your chin either on or near your chest. 3. Slowly lift your head upward. Lift until you are looking straight ahead. Then continue lifting your head  as far back as you can comfortably stretch. 4. Hold your head up for 5 seconds. Then slowly lower it to your starting position. Repeat __________ times. Complete this exercise __________ times a day. Supine head lifts 1. Lie on your back (supine position), bending your knees to point to the ceiling and keeping your feet flat on the floor. 2. Lift your head slowly off the floor, raising your chin toward your chest. 3. Hold for 5 seconds. Repeat __________ times. Complete this exercise __________ times a day. Scapular  retraction 1. Stand with your arms at your sides. Look straight ahead. 2. Slowly pull both shoulders (scapulae) backward and downward (retraction) until you feel a stretch between your shoulder blades in your upper back. 3. Hold for 10-30 seconds. 4. Relax and repeat. Repeat __________ times. Complete this exercise __________ times a day. Contact a health care provider if:  Your neck pain or discomfort gets much worse when you do an exercise.  Your neck pain or discomfort does not improve within 2 hours after you exercise. If you have any of these problems, stop exercising right away. Do not do the exercises again unless your health care provider says that you can. Get help right away if:  You develop sudden, severe neck pain. If this happens, stop exercising right away. Do not do the exercises again unless your health care provider says that you can. This information is not intended to replace advice given to you by your health care provider. Make sure you discuss any questions you have with your health care provider. Document Revised: 07/02/2018 Document Reviewed: 07/02/2018 Elsevier Patient Education  2021 [employer].

## 2021-01-10 ENCOUNTER — Other Ambulatory Visit: Payer: Self-pay

## 2021-01-10 ENCOUNTER — Ambulatory Visit (INDEPENDENT_AMBULATORY_CARE_PROVIDER_SITE_OTHER): Payer: PPO | Admitting: Family Medicine

## 2021-01-10 ENCOUNTER — Encounter: Payer: Self-pay | Admitting: Family Medicine

## 2021-01-10 VITALS — BP 107/63 | HR 75 | Temp 98.6°F | Ht 74.0 in | Wt 202.0 lb

## 2021-01-10 DIAGNOSIS — M25511 Pain in right shoulder: Secondary | ICD-10-CM

## 2021-01-10 NOTE — Patient Instructions (Addendum)
Rotator Cuff Tear  A rotator cuff tear is a partial or complete tear of the cord-like bands (tendons) that connect muscle to bone in the rotator cuff. The rotator cuff is a group of muscles and tendons that surround the shoulder joint and keep the upper arm bone (humerus) in the shoulder socket. The tear can occur suddenly (acute tear) or can develop over a long period of time (chronic tear). What are the causes? Acute tears may be caused by:  A fall, especially on an outstretched arm.  Lifting very heavy objects with a jerking motion. Chronic tears may be caused by overuse of the muscles. This may happen in sports, physical work, or activities in which your arm repeatedly moves over your head. What increases the risk? This condition is more likely to occur in:  Athletes and workers who frequently use their shoulder or reach over their head. This may include activities such as: ? Tennis. ? Baseball and softball. ? Swimming and rowing. ? Weight lifting. ? Construction work. ? Painting.  People who smoke.  Older people who have arthritis or poor blood supply. These can make the muscles and tendons weaker. What are the signs or symptoms? Symptoms of this condition depend on the type and severity of the injury:  An acute tear may include a sudden tearing feeling, followed by severe pain that goes from your upper shoulder, down your arm, and toward your elbow.  A chronic tear includes a gradual weakness and decreased shoulder motion as the pain gets worse. The pain is usually worse at night. Both types may have symptoms such as:  Pain that spreads (radiates) from the shoulder to the upper arm.  Swelling and tenderness in front of the shoulder.  Decreased range of motion.  Pain when: ? Reaching, pulling, or lifting the arm above the head. ? Lowering the arm from above the head.  Not being able to raise your arm out to the side.  Difficulty placing the arm behind your back. How  is this diagnosed? This condition is diagnosed with a medical history and physical exam. Imaging tests may also be done, including:  X-rays.  MRI.  Ultrasound.  CT or MR arthrogram. During this test, a contrast material is injected into your shoulder, and then images are taken. How is this treated? Treatment for this condition depends on the type and severity of the condition. In less severe cases, treatment may include:  Rest. This may be done with a sling that holds the shoulder still (immobilization). Your health care provider may also recommend avoiding activities that involve lifting your arm over your head.  Icing the shoulder.  Anti-inflammatory medicines, such as aspirin or ibuprofen.  Strengthening and stretching exercises. Your health care provider may recommend specific exercises to improve your range of motion and strengthen your shoulder. In more severe cases, treatment may include:  Physical therapy.  Steroid injections.  Surgery. Follow these instructions at home: Managing pain, stiffness, and swelling  If directed, put ice on the injured area. To do this: ? If you have a removable sling, remove it as told by your health care provider. ? Put ice in a plastic bag. ? Place a towel between your skin and the bag. ? Leave the ice on for 20 minutes, 2-3 times a day. ? Remove the ice if your skin turns bright red. This is very important. If you cannot feel pain, heat, or cold, you have a greater risk of damage to the area.  Raise (  elevate) the injured area above the level of your heart while you are lying down.  Find a comfortable sleeping position or sleep on a recliner, if available.  Move your fingers often to reduce stiffness and swelling.  Once the swelling has gone down, your health care provider may direct you to apply heat to relax the muscles. Use the heat source that your health care provider recommends, such as a moist heat pack or a heating pad. ? Place  a towel between your skin and the heat source. ? Leave the heat on for 20-30 minutes. ? Remove the heat if your skin turns bright red. This is especially important if you are unable to feel pain, heat, or cold. You have a greater risk of getting burned.      If you have a sling:  Wear the sling as told by your health care provider. Remove it only as told by your health care provider.  Loosen the sling if your fingers tingle, become numb, or turn cold and blue.  Keep the sling clean.  If the sling is not waterproof: ? Do not let it get wet. ? Cover it with a watertight covering when you take a bath or a shower. Activity  Rest your shoulder as told by your health care provider.  Return to your normal activities as told by your health care provider. Ask your health care provider what activities are safe for you.  Ask your health care provider when it is safe to drive if you have a sling on your arm.  Do any exercises or stretches as told by your health care provider. General instructions  Take over-the-counter and prescription medicines only as told by your health care provider.  Do not use any products that contain nicotine or tobacco, such as cigarettes, e-cigarettes, and chewing tobacco. If you need help quitting, ask your health care provider.  Keep all follow-up visits. This is important. Contact a health care provider if:  Your pain gets worse.  You have new pain in your arm, hands, or fingers.  Medicine does not help your pain. Get help right away if:  Your arm, hand, or fingers are numb or tingling.  Your arm, hand, or fingers are swollen or painful or they turn white or blue.  Your hand or fingers on your injured arm are colder than your other hand. Summary  A rotator cuff tear is a partial or complete tear of the cord-like bands (tendons) that connect muscle to bone in the rotator cuff.  The tear can occur suddenly (acute tear) or can develop over a long  period of time (chronic tear).  Treatment generally includes rest, anti-inflammatory medicines, and icing. In some cases, physical therapy and steroid injections may be needed. In severe cases, surgery may be needed. This information is not intended to replace advice given to you by your health care provider. Make sure you discuss any questions you have with your health care provider. Document Revised: 01/06/2020 Document Reviewed: 01/06/2020 Elsevier Patient Education  2021 Elsevier Inc[employer]  

## 2021-01-10 NOTE — Progress Notes (Signed)
This visit occurred during the SARS-CoV-2 public health emergency.  Safety protocols were in place, including screening questions prior to the visit, additional usage of staff PPE, and extensive cleaning of exam room while observing appropriate contact time as indicated for disinfecting solutions.    Nicholas Graham , April 23, 1951, 70 y.o., male MRN: [id number] Patient Care Team    Relationship Specialty Notifications Start End  [name], DO PCP - General Family Medicine  10/31/15   [name], MD (Inactive) Consulting Physician Gastroenterology  04/24/16     Chief Complaint  Patient presents with  . Shoulder Pain    Pt c/o chronic right shoulder pain starting more than 1 month ago. No known trauma. The problem has been gradually worsening and constant. The quality of the pain is described as severe burning and throbbing with some tingling. The symptoms are aggravated by contact and activity such as walking. He has tried OTC ointments (icy hot) and oral narcotics for the symptoms. The treatment provided moderate relief.      Subjective: Pt presents for an OV  For shoulder pain.  Patient reports approximately 6 weeks ago he had noticed worsening right "shoulder "pain.  He states he feels the pain mostly in the posterior aspect near the upper portion of his shoulder blade.  He states the pain seems to continue worsening despite his chronic use/prescription for opioids and muscle relaxers, along with NSAIDs for his lumbar and thoracic conditions.  Patient does not recall a specific injury approximately 6-8 weeks ago or change in activity.  He states he feels a lump in the location of the pain.  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  Depression screen [facility] 2/9 07/12/2020 05/31/2020 12/16/2019 06/23/2019 06/19/2018  Decreased Interest 0 0 0 0 0   Down, Depressed, Hopeless 0 0 0 0 0  PHQ - 2 Score 0 0 0 0 0  Altered sleeping - - - - -  Tired, decreased energy - - - - -  Change in appetite - - - - -  Feeling bad or failure about yourself  - - - - -  Trouble concentrating - - - - -  Moving slowly or fidgety/restless - - - - -  Suicidal thoughts - - - - -  PHQ-9 Score - - - - -  Difficult doing work/chores - - - - -    Allergies  Allergen Reactions  . Celebrex [Celecoxib]   . Cymbalta [Duloxetine Hcl]   . Lyrica [Pregabalin]   . Penicillins     REACTION: hives   Social History   Social History Narrative   Married, Arts administrator. Children (2) Adult, 4 grandchildren.    9 th grade, Retired.   Wears seatbelt   Smoke detector in the home.    Firearms locked in the home.    Feels safe in his relationships.    Past Medical History:  Diagnosis Date  . Arm fracture    left. Dx'd w/RSD post fracture  . Carpal tunnel syndrome   . Chest pain 2016   [facility] (Dr. Matthew Saras) doing stress echo, but feels like musculoskeletal chest wall pain is cause of his discomfort  . Chronic back pain   . COVID-19 06/2020  . Deviated septum   . Diverticulosis   . GERD (gastroesophageal reflux disease)   .  Hyperlipidemia   . Hypertension   . Joint pain   . Muscle pain   . Nephrolithiasis   . OSA on CPAP 07/04/2018   - Trial of CPAP therapy on 17 cm H2O with a Large size Fisher&Paykel Full Face Mask Simplus mask and heated humidification.  . Plantar fasciitis   . Pulmonary nodule 04/2016   [place].  Pt chose to repeat CT chest w/out contrast in 3 mo---as per radiologist's recommendations.  . Right inguinal hernia    fat-containing.  Small hydrocele in left hemiscrotum.  . Snoring   . Swelling    of legs/feet/hands  . Tobacco dependence in remission    Past Surgical History:  Procedure Laterality Date  . COLONOSCOPY    . fracture repair left arm     had a fixator  . INGUINAL HERNIA REPAIR Left 2013   repeat hernia  repair. x2  . KNEE ARTHROSCOPY Right   . [place] [facility]  1993  . NEURECTOMY inguinal after hernia repair Left   . PLANTAR FASCIA SURGERY Right 2000s  . UPPER GI ENDOSCOPY     Family History  Problem Relation Age of Onset  . Lung cancer Mother   . Lung cancer Father   . Bone cancer Brother        bone  . Heart disease Maternal Uncle   . Colon cancer Neg Hx    Allergies as of 01/10/2021      Reactions   Celebrex [celecoxib]    Cymbalta [duloxetine Hcl]    Lyrica [pregabalin]    Penicillins    REACTION: hives      Medication List       Accurate as of January 10, 2021  2:32 PM. If you have any questions, ask your nurse or doctor.        amLODipine 10 MG tablet Commonly known as: NORVASC Take 1 tablet (10 mg total) by mouth daily.   aspirin 81 MG tablet Take 81 mg by mouth daily.   baclofen 20 MG tablet Commonly known as: LIORESAL Take 0.5-1 tablets (10-20 mg total) by mouth 3 (three) times daily.   escitalopram 20 MG tablet Commonly known as: LEXAPRO Take 1 tablet (20 mg total) by mouth daily.   gabapentin 600 MG tablet Commonly known as: NEURONTIN Take 1 tablet (600 mg total) by mouth 3 (three) times daily. 1 tab morning, 1.5 tabs afternoon, 1 tab evening,  doses should be every 8 hours.   hydrochlorothiazide 25 MG tablet Commonly known as: HYDRODIURIL Take 1 tablet (25 mg total) by mouth daily.   lisinopril 10 MG tablet Commonly known as: ZESTRIL Take 1 tablet (10 mg total) by mouth daily.   MegaRed Omega-3 Krill Oil 500 MG Caps Take 2 capsules by mouth daily.   oxyCODONE-acetaminophen 10-325 MG tablet Commonly known as: PERCOCET Take 1 tablet by mouth every 12 (twelve) hours.   oxyCODONE-acetaminophen 10-325 MG tablet Commonly known as: PERCOCET Take 1 tablet by mouth every 12 (twelve) hours.   oxyCODONE-acetaminophen 10-325 MG tablet Commonly known as: PERCOCET Take 1 tablet by mouth every 12 (twelve) hours.   pantoprazole 40 MG  tablet Commonly known as: PROTONIX Take 1 tablet (40 mg total) by mouth daily.   simvastatin 10 MG tablet Commonly known as: ZOCOR TAKE ONE TABLET BY MOUTH EVERY OTHER DAY       All past medical history, surgical history, allergies, family history, immunizations andmedications were updated in the EMR today and reviewed under the history and medication portions  of their EMR.     ROS: Negative, with the exception of above mentioned in HPI   Objective:  BP 107/63   Pulse 75   Temp 98.6 F (37 C) (Oral)   Ht 6\' 2"  (1.88 m)   Wt 202 lb (91.6 kg)   SpO2 96%   BMI 25.94 kg/m  Body mass index is 25.94 kg/m. Gen: Afebrile. No acute distress. Nontoxic in appearance, well developed, well nourished.  HENT: AT. [place].  Eyes:Pupils Equal Round Reactive to light, Extraocular movements intact,  Conjunctiva without redness, discharge or icterus. MSK: no erythema, TTP right suprascapular with palpable mass and near bicep tendon insertion. + empty can test with extreme weakness and difficulty holding her arm held without resistance/shaking present and [place]. FROM in ABD bilaterally with pain right posterior shoulder. NV Intact distally.  Skin: no rashes, purpura or petechiae.  Neuro:  Normal gait. PERLA. EOMi. Alert. Oriented x3 Psych: Normal affect, dress and demeanor. Normal speech. Normal thought content and judgment.  No exam data present No results found. No results found for this or any previous visit (from the past 24 hour(s)).  Assessment/Plan: Nicholas Graham is a 70 y.o. male present for OV for  Acute right shoulder pain Possible rotator strain vs tear. Rather significantly tender supraspinatus muscle belly with "knot" concerning for tear and subscapularis insertion pain.  Continue chronic pain script and NSAID REST> he was strongly encouraged to avoid lifting and repetitive movement that causes pain. He does admit he is  a little stubborn concerning this matter.  - Ambulatory  referral to Orthopedic Surgery   Reviewed expectations re: course of current medical issues.  Discussed self-management of symptoms.  Outlined signs and symptoms indicating need for more acute intervention.  Patient verbalized understanding and all questions were answered.  Patient received an After-Visit Summary.    Orders Placed This Encounter  Procedures  . Ambulatory referral to Orthopedic Surgery   No orders of the defined types were placed in this encounter.   Referral Orders     Ambulatory referral to Orthopedic Surgery   Note is dictated utilizing voice recognition software. Although note has been proof read prior to signing, occasional typographical errors still can be missed. If any questions arise, please do not hesitate to call for verification.   electronically signed by:  Howard Pouch, DO  [place]

## 2021-01-13 NOTE — Progress Notes (Signed)
Subjective:    I'm seeing this patient as a consultation for:  Dr. Raoul Pitch. Note will be routed back to referring provider/PCP.  CC: R shoulder pain  I, Molly Weber, LAT, ATC, am serving as scribe for Dr. Lynne Leader.  HPI: Pt is a 70 y/o male presenting w/ c/o R shoulder pain x approximately 6 weeks w/ no known MOI.  He locates his pain to his R upper trap, post shoulder and scapula.  Pain is located in the lateral upper arm and into the anterior shoulder with abduction reaching back and at bedtime.  Pain is located in the trapezius with these motion and neck motion as well.  He notes some numbness and tingling down into his hand as well however he does note history of nerve damage into the forearm previously with chronic history of paresthesias into the hand previous to this episode of pain.  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  Prior history nerve injury right arm  Review of Systems: No new headache, visual changes, nausea, vomiting, diarrhea, constipation, dizziness, abdominal pain, skin rash, fevers, chills, night sweats, weight loss, swollen lymph nodes, body aches, joint swelling, muscle aches, chest pain, shortness of breath, mood changes, visual or auditory hallucinations.   Objective:    Vitals:   01/16/21 0934  BP: 110/60  Pulse: 71  SpO2: 97%   General: Well Developed, well nourished, and in no acute distress.  Neuro/Psych: Alert and oriented x3, extra-ocular muscles intact, able to move all 4 extremities, sensation grossly intact. Skin: Warm and dry, no rashes noted.  Respiratory: Not using accessory  muscles, speaking in full sentences, trachea midline.  Cardiovascular: Pulses palpable, no extremity edema. Abdomen: Does not appear distended. MSK: C-spine normal-appearing Nontender midline. Tender palpation right trapezius. Normal cervical motion. Upper extremity strength is intact. Reflexes are intact.  Right shoulder normal-appearing Tender palpation right trapezius otherwise nontender. Normal motion pain with abduction. Positive Hawkins and Neer's test.  Positive empty can test. Negative Yergason's and speeds test.   Lab and Radiology Results  X-ray C-spine and right shoulder obtained today personally and independently interpreted  C-spine: No fractures or significant DDD  Right shoulder: Mild AC DJD.  No fractures or malalignment.  Await formal radiology review  Procedure: Real-time Ultrasound Guided Injection of right shoulder subacromial bursa Device: Philips Affiniti 50G Images permanently stored and available for review in PACS Ultrasound evaluation right shoulder reveals intact biceps tendon and rotator cuff tendons.  Moderate subacromial bursitis present Verbal informed consent obtained.  Discussed risks and benefits of procedure. Warned about infection bleeding damage to structures skin hypopigmentation and fat atrophy among others. Patient expresses understanding and agreement Time-out conducted.   Noted no overlying erythema, induration, or other signs of local infection.   Skin prepped in a sterile fashion.   Local anesthesia: Topical Ethyl chloride.   With sterile technique and under real time ultrasound guidance:  40 mg of Kenalog and 2 mL of Marcaine injected into subacromial bursa. Fluid seen entering the bursa.   Completed without difficulty   Pain immediately resolved suggesting accurate placement of the medication.   Advised to call if fevers/chills, erythema, induration, drainage, or persistent bleeding.   Images  permanently stored and available for  review in the ultrasound unit.  Impression: Technically successful ultrasound guided injection.       Impression and Recommendations:    Assessment and Plan: 70 y.o. male with right arm pain.  Multifactorial pain.  Patient has lots of trapezius dysfunction right side and evidence of right shoulder rotator cuff tendinitis and bursitis.  Plan for subacromial injection and physical therapy today.  This should help both issues.  Doubtful for right cervical radiculopathy.  Recheck in 6 weeks.  Return sooner if needed.  Marland Kitchen  PDMP not reviewed this encounter. Orders Placed This Encounter  Procedures  . DG Cervical Spine 2 or 3 views    Standing Status:   Future    Number of Occurrences:   1    Standing Expiration Date:   02/16/2021    Order Specific Question:   Reason for Exam (SYMPTOM  OR DIAGNOSIS REQUIRED)    Answer:   Neck pain    Order Specific Question:   Preferred imaging location?    Answer:   [facility]  . [place] LIMITED JOINT SPACE STRUCTURES UP RIGHT(NO LINKED CHARGES)    Order Specific Question:   Reason for Exam (SYMPTOM  OR DIAGNOSIS REQUIRED)    Answer:   R shoulder pain    Order Specific Question:   Preferred imaging location?    Answer:   [place]  . DG Shoulder Right    Standing Status:   Future    Number of Occurrences:   1    Standing Expiration Date:   01/16/2022    Order Specific Question:   Reason for Exam (SYMPTOM  OR DIAGNOSIS REQUIRED)    Answer:   eval shoudler pain    Order Specific Question:   Preferred imaging location?    Answer:   [facility]  . Ambulatory referral to Physical Therapy    Referral Priority:   Routine    Referral Type:   Physical Medicine    Referral Reason:   Specialty Services Required    Requested Specialty:   Physical Therapy    Number of Visits Requested:   1   No orders of the defined types were placed in this encounter.   Discussed warning signs or symptoms. Please see discharge  instructions. Patient expresses understanding.   The above documentation has been reviewed and is accurate and complete Lynne Leader, M.D.

## 2021-01-16 ENCOUNTER — Ambulatory Visit: Payer: PPO | Admitting: Family Medicine

## 2021-01-16 ENCOUNTER — Ambulatory Visit (INDEPENDENT_AMBULATORY_CARE_PROVIDER_SITE_OTHER): Payer: PPO

## 2021-01-16 ENCOUNTER — Encounter: Payer: Self-pay | Admitting: Family Medicine

## 2021-01-16 ENCOUNTER — Ambulatory Visit: Payer: Self-pay

## 2021-01-16 ENCOUNTER — Other Ambulatory Visit: Payer: Self-pay

## 2021-01-16 VITALS — BP 110/60 | HR 71 | Ht 74.0 in | Wt 201.6 lb

## 2021-01-16 DIAGNOSIS — M542 Cervicalgia: Secondary | ICD-10-CM

## 2021-01-16 DIAGNOSIS — M79601 Pain in right arm: Secondary | ICD-10-CM | POA: Diagnosis not present

## 2021-01-16 DIAGNOSIS — M25511 Pain in right shoulder: Secondary | ICD-10-CM | POA: Diagnosis not present

## 2021-01-16 NOTE — Patient Instructions (Addendum)
Nice to meet you.  Please get an Xray today before you leave.  You had a R shoulder injection today.  Call or go to the ER if you develop a large red swollen joint with extreme pain or oozing puss.   I've referred you to physical therapy.  If you haven't heard from them in one week regarding scheduling, please let me know.  Follow-up in 6 weeks.

## 2021-01-17 NOTE — Progress Notes (Signed)
X-ray right shoulder looks normal to radiology

## 2021-01-17 NOTE — Progress Notes (Signed)
X-ray cervical spine does show some arthritis changes present.  However no acute findings are seen.

## 2021-01-25 DIAGNOSIS — M542 Cervicalgia: Secondary | ICD-10-CM | POA: Diagnosis not present

## 2021-01-25 DIAGNOSIS — M79601 Pain in right arm: Secondary | ICD-10-CM | POA: Diagnosis not present

## 2021-01-30 DIAGNOSIS — M542 Cervicalgia: Secondary | ICD-10-CM | POA: Diagnosis not present

## 2021-01-30 DIAGNOSIS — M79601 Pain in right arm: Secondary | ICD-10-CM | POA: Diagnosis not present

## 2021-02-02 DIAGNOSIS — M542 Cervicalgia: Secondary | ICD-10-CM | POA: Diagnosis not present

## 2021-02-02 DIAGNOSIS — M79601 Pain in right arm: Secondary | ICD-10-CM | POA: Diagnosis not present

## 2021-02-06 DIAGNOSIS — M542 Cervicalgia: Secondary | ICD-10-CM | POA: Diagnosis not present

## 2021-02-06 DIAGNOSIS — M79601 Pain in right arm: Secondary | ICD-10-CM | POA: Diagnosis not present

## 2021-02-09 DIAGNOSIS — M542 Cervicalgia: Secondary | ICD-10-CM | POA: Diagnosis not present

## 2021-02-09 DIAGNOSIS — M79601 Pain in right arm: Secondary | ICD-10-CM | POA: Diagnosis not present

## 2021-02-23 DIAGNOSIS — M79601 Pain in right arm: Secondary | ICD-10-CM | POA: Diagnosis not present

## 2021-02-23 DIAGNOSIS — M542 Cervicalgia: Secondary | ICD-10-CM | POA: Diagnosis not present

## 2021-02-26 NOTE — Progress Notes (Signed)
Rito Ehrlich, am serving as a Education administrator for Dr. Lynne Leader.   Nicholas Graham is a 70 y.o. male who presents to [facility] today for f/u multifactorial R arm pain. Pt was last seen by Dr. [name] on 01/16/21 and was given a R subacromial steroid injection and was referred to [facility]. Today, pt reports the shoulder is doing terrible the injection only helped two days and he did attend 6 sessions of PT. States that we should have notes from the PT office explaining what has been going on.   Dx imaging: 01/16/21 C-spine & R shoulder XR  Pertinent review of systems: No fevers or chills  Relevant historical information: Hypertension, sleep apnea   Exam:  BP 120/70 (BP Location: Left Arm, Patient Position: Sitting, Cuff Size: Normal)   Pulse 68   Ht 6\' 2"  (1.88 m)   Wt 199 lb (90.3 kg)   SpO2 96%   BMI 25.55 kg/m  General: Well Developed, well nourished, and in no acute distress.   MSK: C-spine: Normal. Nontender midline. Tender palpation right cervical paraspinal musculature and trapezius. Decreased cervical range of motion. Upper extremity strength decreased abduction right arm. Reflexes intact. Sensation intact.  Right shoulder normal. Nontender. Range of motion reduced abduction and internal rotation Strength 4/5 abduction intact external and internal rotation. Positive empty can test. Positive Hawkins and Neer's test. Positive Yergason's and speeds test. Pulses capillary refill and sensation are intact distally.   Lab and Radiology Results DG Cervical Spine 2 or 3 views  Result Date: 01/17/2021 CLINICAL DATA:  Right shoulder pain.  Neck pain. EXAM: CERVICAL SPINE - 2-3 VIEW COMPARISON:  11/21/2018 and MRI cervical spine 01/26/2019 FINDINGS: Normal alignment of the cervical spine. The cervicothoracic junction bone detail is limited on the lateral view. Vertebral body heights and disc spaces are maintained. Prevertebral soft tissues are  normal. Degenerative facet arthropathy particularly on the right side. Atherosclerotic calcifications at the aortic arch. Possible calcified granuloma in the left upper lung. IMPRESSION: No acute abnormality in cervical spine. Electronically Signed   By: Markus Daft M.D.   On: 01/17/2021 09:35   DG Shoulder Right  Result Date: 01/17/2021 CLINICAL DATA:  Shoulder pain over the last 6 weeks. EXAM: RIGHT SHOULDER - 2+ VIEW COMPARISON:  None. FINDINGS: There is no evidence of fracture or dislocation. There is no evidence of arthropathy or other focal bone abnormality. Soft tissues are unremarkable. IMPRESSION: Normal radiographs. Electronically Signed   By: Nelson Chimes M.D.   On: 01/17/2021 10:32   [place] LIMITED JOINT SPACE STRUCTURES UP RIGHT(NO LINKED CHARGES)  Result Date: 01/31/2021 Procedure: Real-time Ultrasound Guided Injection of right shoulder subacromial bursa Device: Philips Affiniti 50G Images permanently stored and available for review in PACS Ultrasound evaluation right shoulder reveals intact biceps tendon and rotator cuff tendons.  Moderate subacromial bursitis present Verbal informed consent obtained.  Discussed risks and benefits of procedure. Warned about infection bleeding damage to structures skin hypopigmentation and fat atrophy among others. Patient expresses understanding and agreement Time-out conducted.   Noted no overlying erythema, induration, or other signs of local infection.   Skin prepped in a sterile fashion.   Local anesthesia: Topical Ethyl chloride.   With sterile technique and under real time ultrasound guidance:  40 mg of Kenalog and 2 mL of Marcaine injected into subacromial bursa. Fluid seen entering the bursa.   Completed without difficulty   Pain immediately resolved suggesting accurate placement of the medication.  Advised to call if fevers/chills, erythema, induration, drainage, or persistent bleeding.   Images permanently stored and available for review in the ultrasound  unit. Impression: Technically successful ultrasound guided injection.     I, Lynne Leader, personally (independently) visualized and performed the interpretation of the xray images attached in this note.    Assessment and Plan: 70 y.o. male with right shoulder and neck pain.  Multifactorial.  I believe patient simultaneously has rotator cuff tendinitis/bursitis and possibly rotator cuff injury and has facet mediated neck pain and possibly cervical radiculopathy.  He has had extensive physical therapy and trials of conservative management at this point for greater than 6 weeks.  Treatment started for this with his primary care provider September 12, 2020 with visits December 12, 2020 and January 10, 2021 with PCP and me on Jan 16, 2021.    At this point he has had extensive conservative management.  Plan for MRI to further characterize causes of pain.  MRI cervical spine and shoulder.  Cervical spine MRI is for potential injection planning of facet and possibly epidural steroid injections.  Shoulder MRI is to further characterize cause of pain and for potential surgery.  Recheck after MRI.  Trial nortriptyline at bedtime.  This may help pain a little.  Total encounter time 30 minutes including face-to-face time with the patient and, reviewing past medical record, and charting on the date of service.   Treatment plan and options PDMP not reviewed this encounter. Orders Placed This Encounter  Procedures   MR CERVICAL SPINE WO CONTRAST    Standing Status:   Future    Standing Expiration Date:   02/27/2022    Order Specific Question:   What is the patient's sedation requirement?    Answer:   No Sedation    Order Specific Question:   Does the patient have a pacemaker or implanted devices?    Answer:   No    Order Specific Question:   Preferred imaging location?    Answer:   [facility]/process development scientist (table limit-350lbs)   MR SHOULDER RIGHT WO CONTRAST    Standing Status:   Future    Standing  Expiration Date:   02/27/2022    Order Specific Question:   What is the patient's sedation requirement?    Answer:   No Sedation    Order Specific Question:   Does the patient have a pacemaker or implanted devices?    Answer:   No    Order Specific Question:   Preferred imaging location?    Answer:   Product/process development scientist (table limit-350lbs)   Meds ordered this encounter  Medications   nortriptyline (PAMELOR) 25 MG capsule    Sig: Take 1 capsule (25 mg total) by mouth at bedtime.    Dispense:  30 capsule    Refill:  2     Discussed warning signs or symptoms. Please see discharge instructions. Patient expresses understanding.   The above documentation has been reviewed and is accurate and complete Lynne Leader, M.D.

## 2021-02-27 ENCOUNTER — Other Ambulatory Visit: Payer: Self-pay

## 2021-02-27 ENCOUNTER — Ambulatory Visit: Payer: PPO | Admitting: Family Medicine

## 2021-02-27 ENCOUNTER — Encounter: Payer: Self-pay | Admitting: Family Medicine

## 2021-02-27 VITALS — BP 120/70 | HR 68 | Ht 74.0 in | Wt 199.0 lb

## 2021-02-27 DIAGNOSIS — M79601 Pain in right arm: Secondary | ICD-10-CM

## 2021-02-27 DIAGNOSIS — R29898 Other symptoms and signs involving the musculoskeletal system: Secondary | ICD-10-CM | POA: Diagnosis not present

## 2021-02-27 DIAGNOSIS — M47819 Spondylosis without myelopathy or radiculopathy, site unspecified: Secondary | ICD-10-CM | POA: Diagnosis not present

## 2021-02-27 DIAGNOSIS — M5412 Radiculopathy, cervical region: Secondary | ICD-10-CM | POA: Diagnosis not present

## 2021-02-27 DIAGNOSIS — M542 Cervicalgia: Secondary | ICD-10-CM

## 2021-02-27 MED ORDER — NORTRIPTYLINE HCL 25 MG PO CAPS: 25.0000 mg | ORAL_CAPSULE | Freq: Every day | ORAL | 2 refills | Status: DC

## 2021-02-27 NOTE — Patient Instructions (Addendum)
Thank you for coming in today.   You should hear from MRI scheduling within 1 week. If you do not hear please let me know.    Recheck after the MRI.   Pause the PT for now if you dont think it is helping.   Try the nortryptline at bedtime. It can help.   Facet Joint Block The facet joints connect the bones of the spine (vertebrae). They make it possible for you to bend, twist, and make other movements with your spine. They also keep you from bending too far, twisting too far, andmaking other extreme movements. A facet joint block is a procedure in which a numbing medicine (anesthetic) is injected into a facet joint. In many cases, an anti-inflammatory medicine (steroid) is also injected. A facet joint block may be done: To diagnose neck or back pain. If the pain gets better after a facet joint block, it means the pain is probably coming from the facet joint. If the pain does not get better, it means the pain is probably not coming from the facet joint. To relieve neck or back pain that is caused by an inflamed facet joint. A facet joint block is only done to relieve pain if the pain does not improvewith other methods, such as medicine, exercise programs, and physical therapy. Tell a health care provider about: Any allergies you have. All medicines you are taking, including vitamins, herbs, eye drops, creams, and over-the-counter medicines. Any problems you or family members have had with anesthetic medicines. Any blood disorders you have. Any surgeries you have had. Any medical conditions you have or have had. Whether you are pregnant or may be pregnant. What are the risks? Generally, this is a safe procedure. However, problems may occur, including: Bleeding. Injury to a nerve near the injection site. Pain at the injection site. Weakness or numbness in areas controlled by nerves near the injection site. Infection. Temporary fluid retention. Allergic reactions to medicines or  dyes. Injury to other structures or organs near the injection site. What happens before the procedure? Medicines Ask your health care provider about: Changing or stopping your regular medicines. This is especially important if you are taking diabetes medicines or blood thinners. Taking medicines such as aspirin and ibuprofen. These medicines can thin your blood. Do not take these medicines unless your health care provider tells you to take them. Taking over-the-counter medicines, vitamins, herbs, and supplements. Eating and drinking Follow instructions from your health care provider about eating and drinking, which may include: 8 hours before the procedure - stop eating heavy meals or foods, such as meat, fried foods, or fatty foods. 6 hours before the procedure - stop eating light meals or foods, such as toast or cereal. 6 hours before the procedure - stop drinking milk or drinks that contain milk. 2 hours before the procedure - stop drinking clear liquids. Staying hydrated Follow instructions from your health care provider about hydration, which may include: Up to 2 hours before the procedure - you may continue to drink clear liquids, such as water, clear fruit juice, black coffee, and plain tea. General instructions Do not use any products that contain nicotine or tobacco for at least 4-6 weeks before the procedure. These products include cigarettes, e-cigarettes, and chewing tobacco. If you need help quitting, ask your health care provider. Plan to have someone take you home from the hospital or clinic. Ask your health care provider: How your surgery site will be marked. What steps will be taken to  help prevent infection. These may include: Removing hair at the surgery site. Washing skin with a germ-killing soap. Receiving antibiotic medicine. What happens during the procedure?  You will put on a hospital gown. You will lie on your stomach on an X-ray table. You may be asked to lie  in a different position if an injection will be made in your neck. Machines will be used to monitor your oxygen levels, heart rate, and blood pressure. Your skin will be cleaned. If an injection will be made in your neck, an IV will be inserted into one of your veins. Fluids and medicine will flow directly into your body through the IV. A numbing medicine (local anesthetic) will be applied to your skin. Your skin may sting or burn for a moment. A video X-ray machine (fluoroscopy) will be used to find the joint. In some cases, a CT scan may be used. A contrast dye may be injected into the facet joint area to help find the joint. When the joint is located, an anesthetic will be injected into the joint through the needle. Your health care provider will ask you whether you feel pain relief. If you feel relief, a steroid may be injected to provide pain relief for a longer period of time. If you do not feel relief or feel only partial relief, additional injections of an anesthetic may be made in other facet joints. The needle will be removed. Your skin will be cleaned. A bandage (dressing) will be applied over each injection site. The procedure may vary among health care providers and hospitals. What happens after the procedure? Your blood pressure, heart rate, breathing rate, and blood oxygen level will be monitored until you leave the hospital or clinic. You will lie down and rest for a period of time. Summary A facet joint block is a procedure in which a numbing medicine (anesthetic) is injected into a facet joint. An anti-inflammatory medicine (stereoid) may also be injected. Follow instructions from your health care provider about medicines and eating and drinking before the procedure. Do not use any products that contain nicotine or tobacco for at least 4-6 weeks before the procedure. You will lie on your stomach for the procedure, but you may be asked to lie in a different position if an  injection will be made in your neck. When the joint is located, an anesthetic will be injected into the joint through the needle. This information is not intended to replace advice given to you by your health care provider. Make sure you discuss any questions you have with your healthcare provider. Document Revised: 12/25/2018 Document Reviewed: 08/08/2018 Elsevier Patient Education  2022 [employer].

## 2021-03-05 ENCOUNTER — Ambulatory Visit (INDEPENDENT_AMBULATORY_CARE_PROVIDER_SITE_OTHER): Payer: PPO

## 2021-03-05 ENCOUNTER — Other Ambulatory Visit: Payer: Self-pay

## 2021-03-05 DIAGNOSIS — M79601 Pain in right arm: Secondary | ICD-10-CM

## 2021-03-05 DIAGNOSIS — R29898 Other symptoms and signs involving the musculoskeletal system: Secondary | ICD-10-CM

## 2021-03-05 DIAGNOSIS — M5412 Radiculopathy, cervical region: Secondary | ICD-10-CM

## 2021-03-05 DIAGNOSIS — M542 Cervicalgia: Secondary | ICD-10-CM

## 2021-03-05 DIAGNOSIS — M47819 Spondylosis without myelopathy or radiculopathy, site unspecified: Secondary | ICD-10-CM

## 2021-03-05 DIAGNOSIS — M19011 Primary osteoarthritis, right shoulder: Secondary | ICD-10-CM | POA: Diagnosis not present

## 2021-03-05 DIAGNOSIS — S43431A Superior glenoid labrum lesion of right shoulder, initial encounter: Secondary | ICD-10-CM | POA: Diagnosis not present

## 2021-03-06 NOTE — Progress Notes (Signed)
Right shoulder MRI shows rotator cuff tendinitis with some tiny incomplete rotator cuff tears.  No high-grade or retracted rotator cuff tears.  There are some small labrum tears as well.  All of this could cause pain and could be treated surgically or potentially with injections.  Recommend return to clinic to go over the results along with the cervical spine MRI in full detail.

## 2021-03-06 NOTE — Progress Notes (Signed)
MRI cervical spine shows multiple levels that could cause potential for pinched nerve on the right C3-4 and C4-5.  Additionally there is some facet arthritis at C7-T1 that could cause neck pain especially on the left.  This potentially could be treated with injections however this would be several different injections. Recommend return to clinic to discuss this in full detail.  If you would like me to go ahead and order the injections now let me know but I do think a follow-up appointment is a good idea.

## 2021-03-07 ENCOUNTER — Other Ambulatory Visit: Payer: Self-pay | Admitting: Family Medicine

## 2021-03-07 DIAGNOSIS — I10 Essential (primary) hypertension: Secondary | ICD-10-CM

## 2021-03-07 NOTE — Progress Notes (Signed)
I, Nicholas Graham, LAT, ATC, am serving as scribe for Dr. Lynne Leader.  Nicholas Graham is a 70 y.o. male who presents to [place] [facility] today for f/u of R shoulder pain and R shoulder and neck MRI review.  He was last seen by Dr. [name] on 02/27/21 and reported worsening R shoulder pain w/ only limited relief from the R subacromial injection he had on 01/16/21. Today, pt reports pain is about the same since his last visits. Pt notes numbness/tingling in his R upper arm.   Pt also c/o R-sided low back that worsened this morning when on a walk. Pt reports some help from the nortriptyline w/ his sleep.  Neck pain is not much on the left side.  Neck pain is more concentrated on the right side of his neck.  Diagnostic testing:R shoulder and c-spine MRI- 03/05/21; C-spine and R shoulder XR- 01/16/21   Pertinent review of systems: No fevers or chills  Relevant historical information: Hypertension, sleep apnea, chronic pain management with PCP.   Exam:  BP 126/78 (BP Location: Left Arm, Patient Position: Sitting, Cuff Size: Normal)   Pulse 69   Ht 6\' 2"  (1.88 m)   Wt 206 lb 6.4 oz (93.6 kg)   SpO2 98%   BMI 26.50 kg/m  General: Well Developed, well nourished, and in no acute distress.   MSK: C-spine decreased cervical motion. Right shoulder pain with abduction.    Lab and Radiology Results No results found for this or any previous visit (from the past 72 hour(s)). MR CERVICAL SPINE WO CONTRAST  Result Date: 03/06/2021 CLINICAL DATA:  Neck pain. Right arm pain. Arm weakness. Cervical radiculitis. Facet joint disease. Cervical radiculopathy, no red flags. Additional history provided by scanning technologist: Patient reports right-sided neck and shoulder pain for 3 months. Pain radiates into right arm with weakness. EXAM: MRI CERVICAL SPINE WITHOUT CONTRAST TECHNIQUE: Multiplanar, multisequence MR imaging of the cervical spine was performed. No intravenous contrast  was administered. COMPARISON:  Prior cervical spine radiographs 01/16/2021. FINDINGS: Alignment: Trace C3-C4 and C4-C5 grade 1 retrolisthesis. Trace C6-C7 and C7-T1 grade 1 anterolisthesis. Vertebrae: She will body height is maintained. Mild edema within the left C6 and C7 articular pillars. Elsewhere, no significant marrow edema or focal suspicious osseous lesion is identified. Cord: No spinal cord signal abnormality is identified. Posterior Fossa, vertebral arteries, paraspinal tissues: No abnormality identified within included portions of the posterior fossa. Flow voids preserved within the imaged cervical vertebral arteries. Paraspinal soft tissues within normal limits. Disc levels: No more than mild disc degeneration at any level. C2-C3: Minimal uncovertebral hypertrophy. No significant disc herniation, spinal canal stenosis or neural foraminal narrowing. C3-C4: Posterior annular fissure. Disc bulge. Right greater than left uncovertebral hypertrophy. Minimal partial effacement of the ventral thecal sac without spinal cord mass effect. Moderate/severe right neural foraminal narrowing. C4-C5: Disc bulge. Superimposed small central disc protrusion. Uncovertebral hypertrophy on the right. Minimal partial effacement of the ventral thecal sac without spinal cord mass effect. Bilateral neural foraminal narrowing (moderate/severe right, mild left). C5-C6: Disc bulge. Superimposed small central disc protrusion. Mild uncovertebral hypertrophy on the right. Facet arthrosis with minimal ligamentum flavum hypertrophy. Mild relative spinal canal narrowing without spinal cord mass effect. Mild relative right neural foraminal narrowing. C6-C7: Shallow disc bulge. Superimposed tiny left center disc protrusion. Minimal partial effacement of the ventral thecal sac without spinal cord mass effect. No significant foraminal stenosis. C7-T1: Facet arthrosis (predominantly on the left). No significant disc herniation  or stenosis.  IMPRESSION: Cervical spondylosis, as outlined. No more than mild relative spinal canal narrowing at any level. No spinal cord mass effect. Multilevel neural foraminal narrowing, as detailed and greatest on the right at C3-C4 and C4-C5 (moderate/severe at these sites). Degenerative edema within the left C7-T1 articular pillars related to facet arthrosis at this site. Trace C3-C4 and C4-C5 grade 1 retrolisthesis. Trace C6-C7 and C7-T1 grade 1 anterolisthesis. Electronically Signed   By: Kellie Simmering DO   On: 03/06/2021 08:45   MR SHOULDER RIGHT WO CONTRAST  Result Date: 03/06/2021 CLINICAL DATA:  Shoulder pain, rotator cuff disorder suspected, xray done EXAM: MRI OF THE RIGHT SHOULDER WITHOUT CONTRAST TECHNIQUE: Multiplanar, multisequence MR imaging of the shoulder was performed. No intravenous contrast was administered. COMPARISON:  Right shoulder radiograph 01/16/2021 FINDINGS: Rotator cuff: There is distal supraspinatus and infraspinatus tendinosis with bursal sided fraying. Likely tiny bursal sided tears of the far anterior fibers of the supraspinatus tendon at the footprint. No high-grade or retracted cuff tear. Teres minor tendon is intact. Subscapularis tendon is intact. Muscles: No muscle atrophy or edema. No intramuscular fluid collection or hematoma. Biceps Long Head: Intra-articular long head biceps tendinosis. The extra-articular long head biceps tendon is intact within the bicipital groove. Acromioclavicular Joint: Mild arthropathy of the acromioclavicular joint. Minimal subacromial/subdeltoid bursal fluid. Glenohumeral Joint: No joint effusion. Mild chondrosis. Labrum: Nondisplaced degenerative posterosuperior labral tearing including the biceps labral anchor. Bones: No fracture or dislocation. No aggressive osseous lesion. Other: No fluid collection or hematoma. IMPRESSION: Distal supraspinatus and infraspinatus tendinosis with bursal sided fraying. Likely tiny bursal sided tears of the far anterior  fibers of the supraspinatus tendon at the footprint. No high-grade or retracted cuff tear. Intra-articular long head biceps tendinosis. Nondisplaced degenerative posterosuperior labral tearing including the biceps labral anchor. Mild glenohumeral and AC joint arthropathy. Electronically Signed   By: Maurine Simmering   On: 03/06/2021 10:34   I, Lynne Leader, personally (independently) visualized and performed the interpretation of the images attached in this note.     Assessment and Plan: 70 y.o. male with multifactorial shoulder and neck pain.  Patient likely has cervical radiculopathy at C4 and C5 dermatomal patterns based on neuroforaminal stenosis at C3-4 and C4-5 seen on cervical MRI as noted above.  This probably is largely the main cause of his pain in the upper arm and shoulder pain.  Plan for epidural steroid injection.  This will likely need to be done at the C7 level and hopefully will be able to get the steroids high enough to make a difference for his pain.  Additionally he does have rotator cuff tendinopathy, and possible bursal surface tears on MRI.  This occurs also in the setting of labrum tear and intra-articular biceps tendinitis.  This is probably also a source of shoulder pain.  He did not respond very well to a subacromial injection indicating that the main source of his shoulder pain is probably intra-articular.  If not doing very well with epidural steroid injection would recommend intra-articular shoulder injection and if not doing well with that would recommend surgical consultation.  Patient has a follow-up appointment with me already scheduled for July 11.  He should probably have his epidural steroid injection done by then and we can see how he is feeling.  Ativan prescribed for anxiety with injection.  PDMP not reviewed this encounter. Orders Placed This Encounter  Procedures   DG INJECT DIAG/THERA/INC NEEDLE/CATH/PLC EPI/LUMB/SAC W/IMG    CERV EPI #1 HTA 433  LBS PACS  (03/05/21) *ASA 81MG / NO THINS/OTC *SCREENED*    Standing Status:   Future    Standing Expiration Date:   03/08/2022    Order Specific Question:   Reason for Exam (SYMPTOM  OR DIAGNOSIS REQUIRED)    Answer:   rt C5 radiculopathy. Level and technue per radiology    Order Specific Question:   Preferred Imaging Location?    Answer:   [id number]    Order Specific Question:   Radiology Contrast Protocol - do NOT remove file path    Answer:   [email or web address]   Meds ordered this encounter  Medications   LORazepam (ATIVAN) 0.5 MG tablet    Sig: 1-2 tabs 30 - 60 min prior to neck injection Do not drive with this medicine.    Dispense:  4 tablet    Refill:  0     Discussed warning signs or symptoms. Please see discharge instructions. Patient expresses understanding.   The above documentation has been reviewed and is accurate and complete Lynne Leader, M.D.

## 2021-03-08 ENCOUNTER — Ambulatory Visit: Payer: PPO | Admitting: Family Medicine

## 2021-03-08 ENCOUNTER — Other Ambulatory Visit: Payer: Self-pay

## 2021-03-08 VITALS — BP 126/78 | HR 69 | Ht 74.0 in | Wt 206.4 lb

## 2021-03-08 DIAGNOSIS — M5412 Radiculopathy, cervical region: Secondary | ICD-10-CM | POA: Diagnosis not present

## 2021-03-08 DIAGNOSIS — M25511 Pain in right shoulder: Secondary | ICD-10-CM | POA: Diagnosis not present

## 2021-03-08 MED ORDER — LORAZEPAM 0.5 MG PO TABS: ORAL_TABLET | ORAL | 0 refills | Status: DC

## 2021-03-08 NOTE — Patient Instructions (Addendum)
Thank you for coming in today.   \Please call [facility] at [phone] to schedule your spine injection.    Follow up on the 11th as scheduled.   I could do a shoulder injection then.    Stop aspirin now.  You need to be off of it for 1 week prior to the injection     Epidural Steroid Injection  An epidural steroid injection is a shot of steroid medicine and numbing medicine that is given into the space between the spinal cord and the bones of the back (epidural space). The shot helps relieve pain caused by an irritated or swollen nerve root. The amount of pain relief you get from the injection depends on what is causing the nerve to be swollen and irritated, and how long your pain lasts. You are more likely to benefit from this injection if your pain is strong and comes on suddenly rather than if you have had long-term (chronic) pain. Tell a health care provider about: Any allergies you have. All medicines you are taking, including vitamins, herbs, eye drops, creams, and over-the-counter medicines. Any problems you or family members have had with anesthetic medicines. Any blood disorders you have. Any surgeries you have had. Any medical conditions you have. Whether you are pregnant or may be pregnant. What are the risks? Generally, this is a safe procedure. However, problems may occur, including: Headache. Bleeding. Infection. Allergic reaction to medicines. Nerve damage. What happens before the procedure? Staying hydrated Follow instructions from your health care provider about hydration, which may include: Up to 2 hours before the procedure - you may continue to drink clear liquids, such as water, clear fruit juice, black coffee, and plain tea. Eating and drinking restrictions Follow instructions from your health care provider about eating and drinking, which may include: 8 hours before the procedure - stop eating heavy meals or foods, such as meat, fried foods, or  fatty foods. 6 hours before the procedure - stop eating light meals or foods, such as toast or cereal. 6 hours before the procedure - stop drinking milk or drinks that contain milk. 2 hours before the procedure - stop drinking clear liquids. Medicines You may be given medicines to lower anxiety. Ask your health care provider about: Changing or stopping your regular medicines. This is especially important if you are taking diabetes medicines or blood thinners. Taking medicines such as aspirin and ibuprofen. These medicines can thin your blood. Do not take these medicines unless your health care provider tells you to take them. Taking over-the-counter medicines, vitamins, herbs, and supplements. General instructions Ask your health care provider what steps will be taken to prevent infection. Plan to have a responsible adult take you home from the hospital or clinic. If you will be going home right after the procedure, plan to have a responsible adult care for you for the time you are told. This is important. What happens during the procedure? An IV will be inserted into one of your veins. You will be given one or more of the following: A medicine to help you relax (sedative). A medicine to numb the area (local anesthetic). You will be asked to lie on your abdomen or sit. The injection site will be cleaned. A needle will be inserted through your skin into the epidural space. This may cause you some discomfort. An X-ray machine will be used to guide the needle as close as possible to the affected nerve. A steroid medicine and a local anesthetic will  be injected into the epidural space. The needle and IV will be removed. A bandage (dressing) will be put over the injection site. The procedure may vary among health care providers and hospitals. What can I expect after the procedure? Your blood pressure, heart rate, breathing rate, and blood oxygen level will be monitored until you leave the  hospital or clinic. Your arm or leg may feel weak or numb for a few hours. The injection site may feel sore. Follow these instructions at home: Injection site care You may remove the bandage (dressing) after 24 hours. Check your injection site every day for signs of infection. Check for: Redness, swelling, or pain. Fluid or blood. Warmth. Pus or a bad smell. Managing pain, stiffness, and swelling For 24 hours after the procedure: Avoid using heat on the injection site. Do not take baths, swim, or use a hot tub until your health care provider approves. Ask your health care provider if you may take showers. You may only be allowed to take sponge baths. If directed, put ice on the injection site. To do this: Put ice in a plastic bag. Place a towel between your skin and the bag. Leave the ice on for 20 minutes, 2-3 times a day.  Activity If you were given a sedative during the procedure, it can affect you for several hours. Do not drive or operate machinery until your health care provider says that it is safe. Return to your normal activities as told by your health care provider. Ask your health care provider what activities are safe for you. General instructions Take over-the-counter and prescription medicines only as told by your health care provider. Drink enough fluid to keep your urine pale yellow. Keep all follow-up visits as told by your health care provider. This is important. Contact a health care provider if: You have any of these signs of infection: Redness, swelling, or pain around your injection site. Fluid or blood coming from your injection site. Warmth coming from your injection site. Pus or a bad smell coming from your injection site. A fever. You continue to have pain and soreness around the injection site, even after taking over-the-counter pain medicine. You have severe, sudden, or lasting nausea or vomiting. Get help right away if: You have severe pain at the  injection site that is not relieved by medicines. You develop a severe headache or a stiff neck. You become sensitive to light. You have any new numbness or weakness in your legs or arms. You lose control of your bladder or bowel movements. You have trouble breathing. Summary An epidural steroid injection is a shot of steroid medicine and numbing medicine that is given into the epidural space. The shot helps relieve pain caused by an irritated or swollen nerve root. You are more likely to benefit from this injection if your pain is strong and comes on suddenly rather than if you have had chronic pain. This information is not intended to replace advice given to you by your health care provider. Make sure you discuss any questions you have with your healthcare provider. Document Revised: 01/01/2020 Document Reviewed: 03/16/2019 Elsevier Patient Education  [place].

## 2021-03-13 ENCOUNTER — Ambulatory Visit
Admission: RE | Admit: 2021-03-13 | Discharge: 2021-03-13 | Disposition: A | Discharge status: Home / Self Care | Payer: PPO | Source: Ambulatory Visit | Attending: Family Medicine | Admitting: Family Medicine

## 2021-03-13 ENCOUNTER — Other Ambulatory Visit: Payer: Self-pay | Admitting: Family Medicine

## 2021-03-13 ENCOUNTER — Other Ambulatory Visit: Payer: Self-pay

## 2021-03-13 DIAGNOSIS — M47812 Spondylosis without myelopathy or radiculopathy, cervical region: Secondary | ICD-10-CM | POA: Diagnosis not present

## 2021-03-13 DIAGNOSIS — M5412 Radiculopathy, cervical region: Secondary | ICD-10-CM

## 2021-03-13 MED ORDER — IOPAMIDOL (ISOVUE-M 300) INJECTION 61%
1.0000 mL | Freq: Once | INTRAMUSCULAR | Status: AC
  Administered 2021-03-13: 1 mL via EPIDURAL

## 2021-03-13 MED ORDER — TRIAMCINOLONE ACETONIDE 40 MG/ML IJ SUSP (RADIOLOGY)
60.0000 mg | Freq: Once | INTRAMUSCULAR | Status: AC
  Administered 2021-03-13: 60 mg via EPIDURAL

## 2021-03-13 NOTE — Discharge Instructions (Signed)

## 2021-03-15 ENCOUNTER — Ambulatory Visit (INDEPENDENT_AMBULATORY_CARE_PROVIDER_SITE_OTHER): Payer: PPO | Admitting: Family Medicine

## 2021-03-15 ENCOUNTER — Other Ambulatory Visit: Payer: Self-pay

## 2021-03-15 ENCOUNTER — Encounter: Payer: Self-pay | Admitting: Family Medicine

## 2021-03-15 VITALS — BP 117/72 | HR 77 | Temp 98.4°F | Ht 74.0 in | Wt 189.0 lb

## 2021-03-15 DIAGNOSIS — Z1211 Encounter for screening for malignant neoplasm of colon: Secondary | ICD-10-CM

## 2021-03-15 DIAGNOSIS — I10 Essential (primary) hypertension: Secondary | ICD-10-CM

## 2021-03-15 DIAGNOSIS — M5090 Cervical disc disorder, unspecified, unspecified cervical region: Secondary | ICD-10-CM

## 2021-03-15 DIAGNOSIS — G8929 Other chronic pain: Secondary | ICD-10-CM | POA: Diagnosis not present

## 2021-03-15 DIAGNOSIS — M5134 Other intervertebral disc degeneration, thoracic region: Secondary | ICD-10-CM

## 2021-03-15 DIAGNOSIS — Z9889 Other specified postprocedural states: Secondary | ICD-10-CM | POA: Diagnosis not present

## 2021-03-15 DIAGNOSIS — M5136 Other intervertebral disc degeneration, lumbar region: Secondary | ICD-10-CM | POA: Diagnosis not present

## 2021-03-15 DIAGNOSIS — M546 Pain in thoracic spine: Secondary | ICD-10-CM | POA: Diagnosis not present

## 2021-03-15 MED ORDER — HYDROCHLOROTHIAZIDE 25 MG PO TABS
25.0000 mg | ORAL_TABLET | Freq: Every day | ORAL | 1 refills | Status: DC
Start: 2021-03-15 — End: 2021-06-21

## 2021-03-15 MED ORDER — ESCITALOPRAM OXALATE 20 MG PO TABS: 20.0000 mg | ORAL_TABLET | Freq: Every day | ORAL | 1 refills | Status: DC

## 2021-03-15 MED ORDER — AMLODIPINE BESYLATE 10 MG PO TABS: 10.0000 mg | ORAL_TABLET | Freq: Every day | ORAL | 1 refills | Status: DC

## 2021-03-15 MED ORDER — OXYCODONE-ACETAMINOPHEN 10-325 MG PO TABS: 1.0000 | ORAL_TABLET | Freq: Two times a day (BID) | ORAL | 0 refills | Status: DC

## 2021-03-15 MED ORDER — BACLOFEN 20 MG PO TABS: 10.0000 mg | ORAL_TABLET | Freq: Three times a day (TID) | ORAL | 1 refills | Status: DC

## 2021-03-15 MED ORDER — GABAPENTIN 600 MG PO TABS: 600.0000 mg | ORAL_TABLET | Freq: Three times a day (TID) | ORAL | 1 refills | Status: DC

## 2021-03-15 MED ORDER — LISINOPRIL 10 MG PO TABS: 10.0000 mg | ORAL_TABLET | Freq: Every day | ORAL | 1 refills | Status: DC

## 2021-03-15 NOTE — Patient Instructions (Signed)
Glad to hear you are getting some relief from her neck injection.   Follow up in 3 mos.    Happy fourth!!!

## 2021-03-15 NOTE — Progress Notes (Signed)
Patient Care Team    Relationship Specialty Notifications Start End  [name], DO PCP - General Family Medicine  10/31/15   [name], MD (Inactive) Consulting Physician Gastroenterology  04/24/16     SUBJECTIVE Chief Complaint  Patient presents with   Pain Management    Pt is not fasting     HPI: Nicholas Graham is a 70 y.o. male present for chronic pain management Cervical neck pain with evidence of disc disease/Chronic midline thoracic back pain/Thoracic degenerative disc disease/DDD (degenerative disc disease), lumbar/History of lumbar discectomy Pt presents for his chronic pain management treatment for his cervical and lumbar conditions. His discomfort has progressed since 2020- but is better controlled on current meds.  He has been evaluated  by neuro and pain rehab. He has received injections- that have only provided a few days of temporary relief. He is frustrated, but has appropriate expectations that he will be living with some pain.  He reports he is maintaining a quality of life and able to remain active with Percocet 10-325 (1 tab every 12 hours), baclofen and gabapentin. 600/600/600 (had headaches at increased doses). He states he is walking a few miles a day and able to continue work with pain med.  Indication for chronic opioid: Cervical spine degeneration, chronic neck and back pain. Medication and dose: Oxycodone-acetaminophen 10-325 1 tab twice daily # pills per month: 60 Last UDS date:10/14/2019> UTD 2021 Pain contract signed (Y/N): Yes Date narcotic database last reviewed (include red flags): 03/17/21 03/08/2021  had epidural injection of cervical spine and he feels it is helpful.   Original note: Lower cervical to mid thoracic pain still present. Using his right arm exacerbates his pain midline of thoracic.  He has a history thoracic spine degeneration and discectomy in his lumbar spine.  Has degenerative changes, bone spurring and facet arthropathy  in his lumbar spine by x-rays obtained 11/2018. Cervical spine x-ray with mild bilateral foraminal narrowing at C3-C4 11/2018. Unfortunately he was reported as allergic or intolerant to many of the medications that would be helpful for him such as gabapentin, Lyrica, Celebrex and Cymbalta --> we discussed this in more detail last visit and he believes that the "allergy-hallucinations " was secondary to having multiple of these medicines on board at the same time back in 2014. We tried to start gabapentin and it did not have any side effects from medication. It has not been extremely helpful  Yet at lower doses. He admits to rather sigmnificant stomach upset starting from the naproxen BID use ober the last 8 week. Advanced imaging has not bee able to be completed 2/2 to covid outbreak. He desired to wait on referral to specialist last visit in hope that he would receive benefit from the gabapentin.   Hypertension/HLD/overweight: Pt reports compliance with amlodipine 10 mg daily, lisinopril 10 mg QD and HCTZ. Patient denies chest pain, shortness of breath, dizziness or lower extremity edema.  Pt takes a daily baby ASA. Pt is  prescribed statin.  Labs UTD Diet: Low-sodium Exercise: Not exercising as routinely. RF: Hypertension, hyperlipidemia, family history of heart disease. Overweight.   ANXIETY:  Patient reports he feels well on Lexapro 20 mg daily and would like to continue   Gerd: Patient reports he is still requiring PPI daily use in order to avoid symptoms.  ROS: See pertinent positives and negatives per HPI.  Patient Active Problem List   Diagnosis Date Noted   Pain management contract agreement 09/12/2020  GERD (gastroesophageal reflux disease) 06/23/2019   Cervical neck pain with evidence of disc disease 01/15/2019   Chronic midline thoracic back pain 01/15/2019   Thoracic degenerative disc disease 01/15/2019   History of lumbar discectomy 01/15/2019   DDD (degenerative disc disease),  lumbar 01/15/2019   OSA on CPAP 07/04/2018   Overweight (BMI 25.0-29.9) 05/01/2018   Benign prostatic hyperplasia with lower urinary tract symptoms 11/15/2017   Anxiety 09/12/2017   Stopped smoking with greater than 30 pack year history 07/30/2017   Pulmonary lesion 05/16/2017   Alcohol ingestion of more than 4 drinks/week 09/10/2014   Elevated hemoglobin A1c 02/19/2014   Dyslipidemia 02/19/2014   Essential hypertension, benign 02/19/2014   History of colonic polyps 09/23/2009   GERD 12/12/2007    Social History   Tobacco Use   Smoking status: Former    Packs/day: 3.00    Years: 30.00    Pack years: 90.00    Types: Cigarettes    Quit date: 07/16/2002    Years since quitting: 18.6   Smokeless tobacco: Never  Substance Use Topics   Alcohol use: Yes    Alcohol/week: 1.0 - 2.0 standard drink    Types: 1 - 2 Cans of beer per week    Comment: socially    Current Outpatient Medications:    aspirin 81 MG tablet, Take 81 mg by mouth daily., Disp: , Rfl:    MegaRed Omega-3 Krill Oil 500 MG CAPS, Take 2 capsules by mouth daily. , Disp: , Rfl:    pantoprazole (PROTONIX) 40 MG tablet, Take 1 tablet (40 mg total) by mouth daily., Disp: 90 tablet, Rfl: 3   simvastatin (ZOCOR) 10 MG tablet, TAKE ONE TABLET BY MOUTH EVERY OTHER DAY, Disp: 15 tablet, Rfl: 11   amLODipine (NORVASC) 10 MG tablet, Take 1 tablet (10 mg total) by mouth daily., Disp: 90 tablet, Rfl: 1   baclofen (LIORESAL) 20 MG tablet, Take 0.5-1 tablets (10-20 mg total) by mouth 3 (three) times daily., Disp: 270 tablet, Rfl: 1   escitalopram (LEXAPRO) 20 MG tablet, Take 1 tablet (20 mg total) by mouth daily., Disp: 90 tablet, Rfl: 1   gabapentin (NEURONTIN) 600 MG tablet, Take 1 tablet (600 mg total) by mouth 3 (three) times daily. 1 tab morning, 1.5 tabs afternoon, 1 tab evening,  doses should be every 8 hours., Disp: 270 tablet, Rfl: 1   hydrochlorothiazide (HYDRODIURIL) 25 MG tablet, Take 1 tablet (25 mg total) by mouth daily.,  Disp: 90 tablet, Rfl: 1   lisinopril (ZESTRIL) 10 MG tablet, Take 1 tablet (10 mg total) by mouth daily., Disp: 90 tablet, Rfl: 1   oxyCODONE-acetaminophen (PERCOCET) 10-325 MG tablet, Take 1 tablet by mouth every 12 (twelve) hours., Disp: 60 tablet, Rfl: 0   oxyCODONE-acetaminophen (PERCOCET) 10-325 MG tablet, Take 1 tablet by mouth every 12 (twelve) hours., Disp: 60 tablet, Rfl: 0   oxyCODONE-acetaminophen (PERCOCET) 10-325 MG tablet, Take 1 tablet by mouth every 12 (twelve) hours., Disp: 60 tablet, Rfl: 0  Allergies  Allergen Reactions   Celebrex [Celecoxib]    Cymbalta [Duloxetine Hcl]    Lyrica [Pregabalin]    Penicillins     REACTION: hives    OBJECTIVE: BP 117/72   Pulse 77   Temp 98.4 F (36.9 C) (Oral)   Ht 6\' 2"  (1.88 m)   Wt 189 lb (85.7 kg)   SpO2 97%   BMI 24.27 kg/m   Gen: Afebrile. No acute distress.  Nontoxic, very pleasant male HENT: AT. [place].  No  cough.  No hoarseness. Eyes:Pupils Equal Round Reactive to light, Extraocular movements intact,  Conjunctiva without redness, discharge or icterus. CV: RRR no murmur.  No edema Chest: CTAB, no wheeze or crackles Skin: no rashes, purpura or petechiae.  Neuro: Normal gait. PERLA. EOMi. Alert. Oriented x3 Psych: Normal affect, dress and demeanor. Normal speech. Normal thought content and judgment.    ASSESSMENT AND PLAN: CHASON MCIVER is a 70 y.o. male present for  Cervical neck pain with evidence of disc disease/Chronic midline thoracic back pain/Thoracic degenerative disc disease/DDD (degenerative disc disease), lumbar/History of lumbar discectomy Stable.  Is following with sports medicine, physical therapy and recently had cervical injections. - persistent pain despite treatments though pain rehab. Agreed to over take pain management. Pt counseled on need of face to face encounter every 3 months for pain management appt only.  - current pain management is helping him maintain activity and improve his quality of  life. -[place] controlled substance database reviewed and appropriate 03/17/21  - Pain contract signed > updated 09/12/2020 - UDS > 09/12/2020 completed -Patient discontinued Mobic. -Continue gabapentin  600/600/600> did not tolerate higher dose (headache) -Continue baclofen 10-20 mg 3 times daily -Continue percocet 10-325 mg BID #60 prescribed with 2 refills (held) at pharmacy.  - f/u 3 mos  Essential hypertension, benign/dyslipidemia/overweight Stable -Continue amlodipine 10 mg daily  -Continue lisinopril 10 mg daily -continue HCTZ. -Continue Zocor 10 mg daily.   - continue fish oil. - continue ASA 81 mg - routine diet and exercise. - F/U 5.5 months with provider on hypertension > labs will be due Sept 2022.     Gastroesophageal reflux disease with esophagitis Stable Continue Protonix PRN   Anxiety:  Stable Continue lexapro 20 mg qd.   Of note he would like to transfer GI doctors.  His wife sees Dr. Henrene Pastor and he would like to see him as well.  Referral placed.   Orders Placed This Encounter  Procedures   Ambulatory referral to Gastroenterology    Meds ordered this encounter  Medications   baclofen (LIORESAL) 20 MG tablet    Sig: Take 0.5-1 tablets (10-20 mg total) by mouth 3 (three) times daily.    Dispense:  270 tablet    Refill:  1   gabapentin (NEURONTIN) 600 MG tablet    Sig: Take 1 tablet (600 mg total) by mouth 3 (three) times daily. 1 tab morning, 1.5 tabs afternoon, 1 tab evening,  doses should be every 8 hours.    Dispense:  270 tablet    Refill:  1    Pt reports not true allergy- was a combination of meds that caused symptoms.   amLODipine (NORVASC) 10 MG tablet    Sig: Take 1 tablet (10 mg total) by mouth daily.    Dispense:  90 tablet    Refill:  1    This prescription was filled on 06/19/2019. Any refills authorized will be placed on file.   escitalopram (LEXAPRO) 20 MG tablet    Sig: Take 1 tablet (20 mg total) by mouth daily.    Dispense:   90 tablet    Refill:  1    This prescription was filled on 05/12/2019. Any refills authorized will be placed on file.   hydrochlorothiazide (HYDRODIURIL) 25 MG tablet    Sig: Take 1 tablet (25 mg total) by mouth daily.    Dispense:  90 tablet    Refill:  1    This prescription was filled on 03/11/2019. Any refills  authorized will be placed on file.   lisinopril (ZESTRIL) 10 MG tablet    Sig: Take 1 tablet (10 mg total) by mouth daily.    Dispense:  90 tablet    Refill:  1   oxyCODONE-acetaminophen (PERCOCET) 10-325 MG tablet    Sig: Take 1 tablet by mouth every 12 (twelve) hours.    Dispense:  60 tablet    Refill:  0   oxyCODONE-acetaminophen (PERCOCET) 10-325 MG tablet    Sig: Take 1 tablet by mouth every 12 (twelve) hours.    Dispense:  60 tablet    Refill:  0    May refill ~27 days after prescribed date   oxyCODONE-acetaminophen (PERCOCET) 10-325 MG tablet    Sig: Take 1 tablet by mouth every 12 (twelve) hours.    Dispense:  60 tablet    Refill:  0    May refill ~57 days after prescribed date       Howard Pouch, DO 03/17/2021

## 2021-03-24 NOTE — Progress Notes (Deleted)
   I, [name], LAT, ATC, am serving as scribe for Dr. Lynne Leader.  Nicholas Graham is a 70 y.o. male who presents to [place] at [facility] today for f/u of neck and R shoulder pain.  He was last seen by Dr. [name] on 03/08/21 to review his c-spine and R shoulder MRIs and was referred for a c-spine ESI (R C7-T1) that he had on 03/13/21.  Since then, pt reports  Diagnostic imaging: R shoulder and c-spine MRI- 03/05/21; C-spine and R shoulder XR- 01/16/21   Pertinent review of systems: ***  Relevant historical information: ***   Exam:  There were no vitals taken for this visit. General: Well Developed, well nourished, and in no acute distress.   MSK: ***    Lab and Radiology Results No results found for this or any previous visit (from the past 72 hour(s)). No results found.     Assessment and Plan: 70 y.o. male with ***   PDMP not reviewed this encounter. No orders of the defined types were placed in this encounter.  No orders of the defined types were placed in this encounter.    Discussed warning signs or symptoms. Please see discharge instructions. Patient expresses understanding.   ***

## 2021-03-27 ENCOUNTER — Ambulatory Visit: Payer: PPO | Admitting: Family Medicine

## 2021-03-30 ENCOUNTER — Ambulatory Visit: Payer: Self-pay

## 2021-03-30 ENCOUNTER — Other Ambulatory Visit: Payer: Self-pay

## 2021-03-30 ENCOUNTER — Ambulatory Visit: Payer: PPO | Admitting: Family Medicine

## 2021-03-30 VITALS — BP 138/88 | HR 80 | Ht 74.0 in | Wt 198.2 lb

## 2021-03-30 DIAGNOSIS — M25511 Pain in right shoulder: Secondary | ICD-10-CM | POA: Diagnosis not present

## 2021-03-30 DIAGNOSIS — M5412 Radiculopathy, cervical region: Secondary | ICD-10-CM | POA: Diagnosis not present

## 2021-03-30 MED ORDER — DIAZEPAM 5 MG PO TABS: ORAL_TABLET | ORAL | 0 refills | Status: DC

## 2021-03-30 NOTE — Patient Instructions (Addendum)
Thank you for coming in today.   Call or go to the ER if you develop a large red swollen joint with extreme pain or oozing puss.    Please call [facility] at [phone] to schedule your spine injection.    Recheck after Aug 20th with your wife.

## 2021-03-30 NOTE — Progress Notes (Signed)
I, Peterson Lombard, LAT, ATC acting as a scribe for Lynne Leader, MD.  Nicholas Graham is a 70 y.o. male who presents to [place] at [facility] today for f/u cervical radiculitis and R shoulder pain. Pt was last seen by Dr. [name] 03/08/21 and was prescribed Ativan and advised to proceed to cervical epidural steroid injection which was done on 03/13/21. Today, pt reports relief from epidural steroid injection for about 2 weeks and now pain has returned, but not as severe. Pt reports the pain has now shifted to the L side of his neck. Pt also reports worsening R shoulder.  Dx imaging: 03/05/21 R shoulder & C-spine MRI  01/16/21 C-spine & R shoulder XR  11/21/18 C-spine XR  Pertinent review of systems: No fevers or chills  Relevant historical information: Chronic pain   Exam:  BP 138/88 (BP Location: Right Arm, Patient Position: Sitting, Cuff Size: Normal)   Pulse 80   Ht 6\' 2"  (1.88 m)   Wt 198 lb 3.2 oz (89.9 kg)   SpO2 98%   BMI 25.45 kg/m  General: Well Developed, well nourished, and in no acute distress.   MSK: C-spine decreased cervical motion. Right shoulder normal-appearing decreased shoulder motion. Intact strength.    Lab and Radiology Results  Procedure: Real-time Ultrasound Guided Injection of right glenohumeral joint posterior approach Device: Philips Affiniti 50G Images permanently stored and available for review in PACS Verbal informed consent obtained.  Discussed risks and benefits of procedure. Warned about infection bleeding damage to structures skin hypopigmentation and fat atrophy among others. Patient expresses understanding and agreement Time-out conducted.   Noted no overlying erythema, induration, or other signs of local infection.   Skin prepped in a sterile fashion.   Local anesthesia: Topical Ethyl chloride.   With sterile technique and under real time ultrasound guidance: 40 mg of Kenalog and 2 mL of Marcaine injected into shoulder  joint. Fluid seen entering the joint capsule.   Completed without difficulty   Pain immediately resolved suggesting accurate placement of the medication.   Advised to call if fevers/chills, erythema, induration, drainage, or persistent bleeding.   Images permanently stored and available for review in the ultrasound unit.  Impression: Technically successful ultrasound guided injection.     EXAM: MRI CERVICAL SPINE WITHOUT CONTRAST   TECHNIQUE: Multiplanar, multisequence MR imaging of the cervical spine was performed. No intravenous contrast was administered.   COMPARISON:  Prior cervical spine radiographs 01/16/2021.   FINDINGS: Alignment: Trace C3-C4 and C4-C5 grade 1 retrolisthesis. Trace C6-C7 and C7-T1 grade 1 anterolisthesis.   Vertebrae: She will body height is maintained. Mild edema within the left C6 and C7 articular pillars. Elsewhere, no significant marrow edema or focal suspicious osseous lesion is identified.   Cord: No spinal cord signal abnormality is identified.   Posterior Fossa, vertebral arteries, paraspinal tissues: No abnormality identified within included portions of the posterior fossa. Flow voids preserved within the imaged cervical vertebral arteries. Paraspinal soft tissues within normal limits.   Disc levels:   No more than mild disc degeneration at any level.   C2-C3: Minimal uncovertebral hypertrophy. No significant disc herniation, spinal canal stenosis or neural foraminal narrowing.   C3-C4: Posterior annular fissure. Disc bulge. Right greater than left uncovertebral hypertrophy. Minimal partial effacement of the ventral thecal sac without spinal cord mass effect. Moderate/severe right neural foraminal narrowing.   C4-C5: Disc bulge. Superimposed small central disc protrusion. Uncovertebral hypertrophy on the right. Minimal partial effacement of the ventral  thecal sac without spinal cord mass effect. Bilateral neural foraminal narrowing  (moderate/severe right, mild left).   C5-C6: Disc bulge. Superimposed small central disc protrusion. Mild uncovertebral hypertrophy on the right. Facet arthrosis with minimal ligamentum flavum hypertrophy. Mild relative spinal canal narrowing without spinal cord mass effect. Mild relative right neural foraminal narrowing.   C6-C7: Shallow disc bulge. Superimposed tiny left center disc protrusion. Minimal partial effacement of the ventral thecal sac without spinal cord mass effect. No significant foraminal stenosis.   C7-T1: Facet arthrosis (predominantly on the left). No significant disc herniation or stenosis.   IMPRESSION: Cervical spondylosis, as outlined. No more than mild relative spinal canal narrowing at any level. No spinal cord mass effect. Multilevel neural foraminal narrowing, as detailed and greatest on the right at C3-C4 and C4-C5 (moderate/severe at these sites).   Degenerative edema within the left C7-T1 articular pillars related to facet arthrosis at this site.   Trace C3-C4 and C4-C5 grade 1 retrolisthesis.   Trace C6-C7 and C7-T1 grade 1 anterolisthesis.     Electronically Signed   By: Kellie Simmering DO   On: 03/06/2021 08:45  EXAM: MRI OF THE RIGHT SHOULDER WITHOUT CONTRAST   TECHNIQUE: Multiplanar, multisequence MR imaging of the shoulder was performed. No intravenous contrast was administered.   COMPARISON:  Right shoulder radiograph 01/16/2021   FINDINGS: Rotator cuff: There is distal supraspinatus and infraspinatus tendinosis with bursal sided fraying. Likely tiny bursal sided tears of the far anterior fibers of the supraspinatus tendon at the footprint. No high-grade or retracted cuff tear. Teres minor tendon is intact. Subscapularis tendon is intact.   Muscles: No muscle atrophy or edema. No intramuscular fluid collection or hematoma.   Biceps Long Head: Intra-articular long head biceps tendinosis. The extra-articular long head biceps tendon  is intact within the bicipital groove.   Acromioclavicular Joint: Mild arthropathy of the acromioclavicular joint. Minimal subacromial/subdeltoid bursal fluid.   Glenohumeral Joint: No joint effusion. Mild chondrosis.   Labrum: Nondisplaced degenerative posterosuperior labral tearing including the biceps labral anchor.   Bones: No fracture or dislocation. No aggressive osseous lesion.   Other: No fluid collection or hematoma.   IMPRESSION: Distal supraspinatus and infraspinatus tendinosis with bursal sided fraying. Likely tiny bursal sided tears of the far anterior fibers of the supraspinatus tendon at the footprint. No high-grade or retracted cuff tear.   Intra-articular long head biceps tendinosis.   Nondisplaced degenerative posterosuperior labral tearing including the biceps labral anchor.   Mild glenohumeral and AC joint arthropathy.     Electronically Signed   By: Maurine Simmering   On: 03/06/2021 10:34  I, Lynne Leader, personally (independently) visualized and performed the interpretation of the images attached in this note.      Assessment and Plan: 70 y.o. male with type factorial shoulder and neck pain.  Right shoulder pain.  Patient did not have much benefit previously with subacromial injection so we will proceed to glenohumeral injection today where he had some benefit in clinic.  We will see how he does a little while longer after the injection today in clinic.  Additionally he continues to experience neck pain thought to be due to C4 and C5 cervical radiculopathy.  He had epidural steroid injection June 27.  This provided benefit for about 2 weeks.  Plan for repeat injection in the near future.  Recheck in about 5 weeks.   PDMP not reviewed this encounter. Orders Placed This Encounter  Procedures   [place] LIMITED JOINT SPACE STRUCTURES UP  RIGHT(NO LINKED CHARGES)    Standing Status:   Future    Number of Occurrences:   1    Standing Expiration Date:    09/30/2021    Order Specific Question:   Reason for Exam (SYMPTOM  OR DIAGNOSIS REQUIRED)    Answer:   right shoulder pain    Order Specific Question:   Preferred imaging location?    Answer:   [place]   DG INJECT DIAG/THERA/INC NEEDLE/CATH/PLC EPI/LUMB/SAC W/IMG    Standing Status:   Future    Standing Expiration Date:   03/30/2022    Order Specific Question:   Reason for Exam (SYMPTOM  OR DIAGNOSIS REQUIRED)    Answer:   C4 c5 radicuopathy. level and technique per radiology    Order Specific Question:   Preferred Imaging Location?    Answer:   [id number]    Order Specific Question:   Radiology Contrast Protocol - do NOT remove file path    Answer:   [email or web address]   Meds ordered this encounter  Medications   diazepam (VALIUM) 5 MG tablet    Sig: Take 1 tab PO 1 hour before procedure or imaging.    Dispense:  2 tablet    Refill:  0     Discussed warning signs or symptoms. Please see discharge instructions. Patient expresses understanding.   The above documentation has been reviewed and is accurate and complete Lynne Leader, M.D.

## 2021-04-18 ENCOUNTER — Other Ambulatory Visit: Payer: PPO

## 2021-04-24 ENCOUNTER — Other Ambulatory Visit: Payer: Self-pay | Admitting: Family Medicine

## 2021-04-24 ENCOUNTER — Other Ambulatory Visit: Payer: Self-pay

## 2021-04-24 ENCOUNTER — Ambulatory Visit
Admission: RE | Admit: 2021-04-24 | Discharge: 2021-04-24 | Disposition: A | Discharge status: Home / Self Care | Payer: PPO | Source: Ambulatory Visit | Attending: Family Medicine | Admitting: Family Medicine

## 2021-04-24 DIAGNOSIS — M5412 Radiculopathy, cervical region: Secondary | ICD-10-CM

## 2021-04-24 DIAGNOSIS — M47812 Spondylosis without myelopathy or radiculopathy, cervical region: Secondary | ICD-10-CM | POA: Diagnosis not present

## 2021-04-24 MED ORDER — IOPAMIDOL (ISOVUE-M 300) INJECTION 61%
1.0000 mL | Freq: Once | INTRAMUSCULAR | Status: AC
  Administered 2021-04-24: 1 mL via EPIDURAL

## 2021-04-24 MED ORDER — TRIAMCINOLONE ACETONIDE 40 MG/ML IJ SUSP (RADIOLOGY)
60.0000 mg | Freq: Once | INTRAMUSCULAR | Status: AC
  Administered 2021-04-24: 60 mg via EPIDURAL

## 2021-04-24 NOTE — Discharge Instructions (Signed)

## 2021-04-27 ENCOUNTER — Other Ambulatory Visit: Payer: Self-pay | Admitting: Family Medicine

## 2021-04-27 DIAGNOSIS — E785 Hyperlipidemia, unspecified: Secondary | ICD-10-CM

## 2021-05-09 ENCOUNTER — Other Ambulatory Visit: Payer: Self-pay

## 2021-05-09 ENCOUNTER — Ambulatory Visit: Payer: PPO | Admitting: Family Medicine

## 2021-05-09 VITALS — BP 112/74 | HR 69 | Ht 74.0 in | Wt 198.8 lb

## 2021-05-09 DIAGNOSIS — M5412 Radiculopathy, cervical region: Secondary | ICD-10-CM

## 2021-05-09 DIAGNOSIS — M25511 Pain in right shoulder: Secondary | ICD-10-CM | POA: Diagnosis not present

## 2021-05-09 MED ORDER — CYCLOBENZAPRINE HCL 10 MG PO TABS: 10.0000 mg | ORAL_TABLET | Freq: Three times a day (TID) | ORAL | 1 refills | Status: DC | PRN

## 2021-05-09 NOTE — Progress Notes (Signed)
I, Nicholas Graham, LAT, ATC, am serving as scribe for Dr. Lynne Graham.  Nicholas Graham is a 70 y.o. male who presents to [place] at [facility] today for f/u of cervical radiculitis and R shoulder pain.  He was last seen by Nicholas Graham on 03/30/21 for f/u after getting a cervical epidural on 03/13/21 (R C7-T1).  He noted relief from his symptoms for approximately 2 weeks w/ recent worsening R shoulder pain and L-sided neck pain.  He had a R GHJ injection and was referred for a 2nd cervical ESI that he had on 04/24/21 (R C7-T1).  Today, pt reports not much relief from R GHJ or ESI. Pt has decreased cervical rotation, R>L. Pt is now experiencing R 2nd-3rd fingers being stuck in ADD and a "cramp" in the palmar aspect of his R hand.  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   Exam:  BP 112/74   Pulse 69   Ht '6\' 2"'$  (1.88 m)   Wt 198 lb 12.8 oz (90.2 kg)   SpO2 97%   BMI 25.52 kg/m  General: Well Developed, well nourished, and in no acute distress.   MSK: C-spine decreased cervical motion. Right shoulder decreased range of motion.    Lab and Radiology Results   EXAM: MRI OF THE RIGHT SHOULDER WITHOUT CONTRAST   TECHNIQUE: Multiplanar, multisequence MR imaging of the shoulder was performed. No intravenous contrast was administered.   COMPARISON:  Right shoulder radiograph 01/16/2021   FINDINGS: Rotator cuff: There is distal supraspinatus and infraspinatus tendinosis with bursal sided fraying. Likely tiny bursal sided tears of the far anterior fibers of the supraspinatus tendon at the footprint. No high-grade or retracted cuff tear. Teres minor tendon is intact. Subscapularis tendon is intact.   Muscles: No muscle atrophy or edema. No intramuscular fluid collection or hematoma.   Biceps Long Head:  Intra-articular long head biceps tendinosis. The extra-articular long head biceps tendon is intact within the bicipital groove.   Acromioclavicular Joint: Mild arthropathy of the acromioclavicular joint. Minimal subacromial/subdeltoid bursal fluid.   Glenohumeral Joint: No joint effusion. Mild chondrosis.   Labrum: Nondisplaced degenerative posterosuperior labral tearing including the biceps labral anchor.   Bones: No fracture or dislocation. No aggressive osseous lesion.   Other: No fluid collection or hematoma.   IMPRESSION: Distal supraspinatus and infraspinatus tendinosis with bursal sided fraying. Likely tiny bursal sided tears of the far anterior fibers of the supraspinatus tendon at the footprint. No high-grade or retracted cuff tear.   Intra-articular long head biceps tendinosis.   Nondisplaced degenerative posterosuperior labral tearing including the biceps labral anchor.   Mild glenohumeral and AC joint arthropathy.     Electronically Signed   By: Nicholas Graham   On: 03/06/2021 10:34  EXAM: MRI CERVICAL SPINE WITHOUT CONTRAST   TECHNIQUE: Multiplanar, multisequence MR imaging of the cervical spine was performed. No intravenous contrast was administered.   COMPARISON:  Prior cervical spine radiographs 01/16/2021.   FINDINGS: Alignment: Trace C3-C4 and C4-C5 grade 1 retrolisthesis. Trace C6-C7 and C7-T1 grade 1 anterolisthesis.   Vertebrae: She will body height is maintained. Mild edema within the left C6 and C7 articular pillars. Elsewhere, no significant marrow edema or focal suspicious osseous lesion is identified.   Cord: No spinal cord signal abnormality is identified.   Posterior Fossa, vertebral arteries, paraspinal tissues: No abnormality  identified within included portions of the posterior fossa. Flow voids preserved within the imaged cervical vertebral arteries. Paraspinal soft tissues within normal limits.   Disc levels:   No more than mild  disc degeneration at any level.   C2-C3: Minimal uncovertebral hypertrophy. No significant disc herniation, spinal canal stenosis or neural foraminal narrowing.   C3-C4: Posterior annular fissure. Disc bulge. Right greater than left uncovertebral hypertrophy. Minimal partial effacement of the ventral thecal sac without spinal cord mass effect. Moderate/severe right neural foraminal narrowing.   C4-C5: Disc bulge. Superimposed small central disc protrusion. Uncovertebral hypertrophy on the right. Minimal partial effacement of the ventral thecal sac without spinal cord mass effect. Bilateral neural foraminal narrowing (moderate/severe right, mild left).   C5-C6: Disc bulge. Superimposed small central disc protrusion. Mild uncovertebral hypertrophy on the right. Facet arthrosis with minimal ligamentum flavum hypertrophy. Mild relative spinal canal narrowing without spinal cord mass effect. Mild relative right neural foraminal narrowing.   C6-C7: Shallow disc bulge. Superimposed tiny left center disc protrusion. Minimal partial effacement of the ventral thecal sac without spinal cord mass effect. No significant foraminal stenosis.   C7-T1: Facet arthrosis (predominantly on the left). No significant disc herniation or stenosis.   IMPRESSION: Cervical spondylosis, as outlined. No more than mild relative spinal canal narrowing at any level. No spinal cord mass effect. Multilevel neural foraminal narrowing, as detailed and greatest on the right at C3-C4 and C4-C5 (moderate/severe at these sites).   Degenerative edema within the left C7-T1 articular pillars related to facet arthrosis at this site.   Trace C3-C4 and C4-C5 grade 1 retrolisthesis.   Trace C6-C7 and C7-T1 grade 1 anterolisthesis.     Electronically Signed   By: Nicholas Simmering DO   On: 03/06/2021 08:45  I, Nicholas Graham, personally (independently) visualized and performed the interpretation of the images attached in this  note.    Nerve conduction study April 02, 2019        Full Name: Nicholas Graham             Gender:           Male MRN #:       [id number]                  Date of Birth:  [birth year]    Visit Date:                     04/02/2019 09:17 Age:                               13 Years 68 Months Old Examining Physician:  Sarina Ill, MD    History:This is a 70 year old gentleman with chronic neck and low back issues. chronic low back pain and reported right leg numbness and weakness with weakness seen on clinical exam.  MRI of the lumbar spine consistent with right L5 radiculopathy.   Summary: EMG/NCS was performed on the right upper and right lower extremities. The right Tibial motor nerve showed reduced amplitude (1.42m, N>4). The right Superficial Peroneal sensory nerve showed delayed distal peak latency(4.741m N<4.4) and reduced amplitude(3uV, N>6). The left and right Tibial H Reflex showed delayed latencies (40.81m51m107m22m<35). The right Tibial F Wave showed delayed Latency (70.3ms,38m56). All remaining nerves and all muscles (as indicated in the following tables) were within normal limits.        Conclusion: EMG did not show acute  ongoing denervation or chronic neurogenic changes in the right lower extremity however clinical presentation, abnormal right tibial motor nerve conductions and delayed tibial F wave as well as MRI of the lumbar spine showing right L5 nerve compression consistent with right L5 radiculopathy.  He also has mild sensory changes and delayed H reflexes bilaterally consistent with concomitant distal LE mild sensory polyneuropathy.  No electrophysiologic evidence for upper extremity mononeuropathy, polyneuropathy or cervical radiculopathy.     Sarina Ill, M.D.   [facility] [place],  [address] Tel: [phone] Fax: [phone]      Assessment and Plan: 70 y.o. male with chronic right-sided neck pain and shoulder pain. Neck pain  thought to be due to C4 radiculopathy with perhaps some more distal cervical radicular symptoms as well.  Additionally neck pain also thought to be due to some of the facet degenerative changes as well.  He has failed conservative management including trials of physical therapy and epidural steroid injection x2.  At this point I think it is worthwhile for him to have a consultation with orthopedic spine surgery to discuss surgical options.  I could see perhaps a more potential injection being beneficial but I think ultimately surgery is probably going to be indicated.  Has a relationship with Dr. [name] so referred to him.  Additionally he has right shoulder pain thought to be due to shoulder impingement and rotator cuff tendinopathy.  Again he has had trials of injection and physical therapy with little benefit.  He will also very likely require decompression surgery.  It is difficult to prioritize which surgery should be first.  It seems as though his neck bothers him more than his shoulder does.  Therefore prioritize C-spine first.    PDMP not reviewed this encounter. Orders Placed This Encounter  Procedures   Ambulatory referral to Orthopedic Surgery    Referral Priority:   Routine    Referral Type:   Surgical    Referral Reason:   Specialty Services Required    Referred to Provider:   [name], MD    Requested Specialty:   Orthopedic Surgery    Number of Visits Requested:   1   Meds ordered this encounter  Medications   cyclobenzaprine (FLEXERIL) 10 MG tablet    Sig: Take 1 tablet (10 mg total) by mouth 3 (three) times daily as needed for muscle spasms.    Dispense:  90 tablet    Refill:  1     Discussed warning signs or symptoms. Please see discharge instructions. Patient expresses understanding.   The above documentation has been reviewed and is accurate and complete Nicholas Graham, M.D.  Total encounter time 30 minutes including face-to-face time with the patient and, reviewing  past medical record, and charting on the date of service.   Treatment plan and options

## 2021-05-09 NOTE — Patient Instructions (Signed)
Thank you for coming in today.   Plan for surgery consultation with Dr [name].   Let me know if you have any questions.   You will also likely need a shoulder surgery.

## 2021-05-31 ENCOUNTER — Encounter: Payer: Self-pay | Admitting: Orthopaedic Surgery

## 2021-05-31 ENCOUNTER — Ambulatory Visit: Payer: PPO | Admitting: Orthopaedic Surgery

## 2021-05-31 ENCOUNTER — Other Ambulatory Visit: Payer: Self-pay

## 2021-05-31 VITALS — BP 117/75 | HR 83 | Ht 74.0 in | Wt 195.0 lb

## 2021-05-31 DIAGNOSIS — M67813 Other specified disorders of tendon, right shoulder: Secondary | ICD-10-CM

## 2021-05-31 DIAGNOSIS — M5090 Cervical disc disorder, unspecified, unspecified cervical region: Secondary | ICD-10-CM | POA: Diagnosis not present

## 2021-05-31 NOTE — Progress Notes (Signed)
Office Visit Note   Patient: Nicholas Graham           Date of Birth: [birth year]           MRN: [id number] Visit Date: 05/31/2021              Requested by: Gregor Hams, MD [place],  [place] [id number] PCP: [name], DO   Assessment & Plan: Visit Diagnoses:  1. Cervical neck pain with evidence of disc disease   2. Biceps tendinosis of right shoulder     Plan: Patient has some cervical spondylosis with slight listhesis and Some small disc protrusions without significant compression.  It appears his principal problem is his right shoulder and likely the superior labral tear with biceps tendinosis and biceps anchor detachment is the principal problem.  His is aged biceps debridement and inspection of the rotator cuff repair of the rotator cuff if needed was discussed.  We discussed possible biceps tenodesis versus a simple biceps tendon release.  He understands that surgically treating the shoulder will not change his cervical spine pathology but it appears that shoulder is currently more symptomatic than cervical spine.  Cervical spine treatment could be deferred to a later date and may not need to be surgically treated.  Questions were elicited and answered he understands and requests we proceed.  Plan would be right shoulder arthroscopy evaluation rotator cuff, surgical repair of the rotator cuff if needed and then treatment for the biceps tendinosis and superior labral tear.  Questions were elicited and answered he understands request to proceed. Follow-Up Instructions: No follow-ups on file.   Orders:  No orders of the defined types were placed in this encounter.  No orders of the defined types were placed in this encounter.     Procedures: No procedures performed   Clinical Data: No additional findings.   Subjective: Chief Complaint  Patient presents with   Neck - Pain   Right Shoulder - Pain    HPI 70 year old male is being followed by Dr.  Lynne Leader for persistent problems with neck and shoulder problems on the right.  Patient's had pain for a year and a half.  Current treatment on gabapentin, chronic oxycodone with pain management contract.  Black), Flexeril, physical therapy without relief.  Cervical epidurals performed.  Patient states therapy has not helped.  He has pain with use of his shoulder.  Some difficulty turning his head.  Cervical and MRI scans have been performed and are available for review.  Of note is past history of distal radius fracture with carpal tunnel syndrome and history of RSD post distal radius fracture.  Patient has had intra-articular glenohumeral injection which gave him temporary relief immediately postinjection.  Cervical MRI showed some retrolisthesis at C3-4 C4-5 and facet hypertrophy.  Shoulder MRI showed some tendinosis of the supraspinatus and infraspinatus with bursal tearing at the footprint anteriorly.  Intra-articular long head biceps tendinosis.  Degenerative superior labrum at the biceps anchor was noted.  Has pain with outstretched reaching attempted overhead activity washing his hair putting his clothes on with right arm overhead.  Review of Systems all other systems noncontributory HPI.   Objective: Vital Signs: BP 117/75   Pulse 83   Ht '6\' 2"'$  (1.88 m)   Wt 195 lb (88.5 kg)   BMI 25.04 kg/m   Physical Exam Constitutional:      Appearance: He is well-developed.  HENT:     Head: Normocephalic and atraumatic.  Right Ear: External ear normal.     Left Ear: External ear normal.  Eyes:     Pupils: Pupils are equal, round, and reactive to light.  Neck:     Thyroid: No thyromegaly.     Trachea: No tracheal deviation.  Cardiovascular:     Rate and Rhythm: Normal rate.  Pulmonary:     Effort: Pulmonary effort is normal.     Breath sounds: No wheezing.  Abdominal:     General: Bowel sounds are normal.     Palpations: Abdomen is soft.  Musculoskeletal:     Cervical back: Neck  supple.  Skin:    General: Skin is warm and dry.     Capillary Refill: Capillary refill takes less than 2 seconds.  Neurological:     Mental Status: He is alert and oriented to person, place, and time.  Psychiatric:        Behavior: Behavior normal.        Thought Content: Thought content normal.        Judgment: Judgment normal.    Ortho Exam patient has long head of the biceps tenderness anteriorly on the right.  Positive speeds test negative drop arm test.  Some discomfort with impingement.  Some brachial plexus tenderness both the right and left.  Brachial plexus tenderness is not severe.  Negative Lhermitte.  Lower extremity reflexes are 2+.  No impingement opposite left shoulder.  Specialty Comments:  No specialty comments available.  Imaging: No results found.CLINICAL DATA:  Neck pain. Right arm pain. Arm weakness. Cervical radiculitis. Facet joint disease. Cervical radiculopathy, no red flags. Additional history provided by scanning technologist: Patient reports right-sided neck and shoulder pain for 3 months. Pain radiates into right arm with weakness.   EXAM: MRI CERVICAL SPINE WITHOUT CONTRAST   TECHNIQUE: Multiplanar, multisequence MR imaging of the cervical spine was performed. No intravenous contrast was administered.   COMPARISON:  Prior cervical spine radiographs 01/16/2021.   FINDINGS: Alignment: Trace C3-C4 and C4-C5 grade 1 retrolisthesis. Trace C6-C7 and C7-T1 grade 1 anterolisthesis.   Vertebrae: She will body height is maintained. Mild edema within the left C6 and C7 articular pillars. Elsewhere, no significant marrow edema or focal suspicious osseous lesion is identified.   Cord: No spinal cord signal abnormality is identified.   Posterior Fossa, vertebral arteries, paraspinal tissues: No abnormality identified within included portions of the posterior fossa. Flow voids preserved within the imaged cervical vertebral arteries. Paraspinal soft  tissues within normal limits.   Disc levels:   No more than mild disc degeneration at any level.   C2-C3: Minimal uncovertebral hypertrophy. No significant disc herniation, spinal canal stenosis or neural foraminal narrowing.   C3-C4: Posterior annular fissure. Disc bulge. Right greater than left uncovertebral hypertrophy. Minimal partial effacement of the ventral thecal sac without spinal cord mass effect. Moderate/severe right neural foraminal narrowing.   C4-C5: Disc bulge. Superimposed small central disc protrusion. Uncovertebral hypertrophy on the right. Minimal partial effacement of the ventral thecal sac without spinal cord mass effect. Bilateral neural foraminal narrowing (moderate/severe right, mild left).   C5-C6: Disc bulge. Superimposed small central disc protrusion. Mild uncovertebral hypertrophy on the right. Facet arthrosis with minimal ligamentum flavum hypertrophy. Mild relative spinal canal narrowing without spinal cord mass effect. Mild relative right neural foraminal narrowing.   C6-C7: Shallow disc bulge. Superimposed tiny left center disc protrusion. Minimal partial effacement of the ventral thecal sac without spinal cord mass effect. No significant foraminal stenosis.   C7-T1: Facet arthrosis (  predominantly on the left). No significant disc herniation or stenosis.   IMPRESSION: Cervical spondylosis, as outlined. No more than mild relative spinal canal narrowing at any level. No spinal cord mass effect. Multilevel neural foraminal narrowing, as detailed and greatest on the right at C3-C4 and C4-C5 (moderate/severe at these sites).   Degenerative edema within the left C7-T1 articular pillars related to facet arthrosis at this site.   Trace C3-C4 and C4-C5 grade 1 retrolisthesis.   Trace C6-C7 and C7-T1 grade 1 anterolisthesis.     Electronically Signed   By: Kellie Simmering DO   On: 03/06/2021 08:45   CLINICAL DATA:  Shoulder pain, rotator cuff  disorder suspected, xray done   EXAM: MRI OF THE RIGHT SHOULDER WITHOUT CONTRAST   TECHNIQUE: Multiplanar, multisequence MR imaging of the shoulder was performed. No intravenous contrast was administered.   COMPARISON:  Right shoulder radiograph 01/16/2021   FINDINGS: Rotator cuff: There is distal supraspinatus and infraspinatus tendinosis with bursal sided fraying. Likely tiny bursal sided tears of the far anterior fibers of the supraspinatus tendon at the footprint. No high-grade or retracted cuff tear. Teres minor tendon is intact. Subscapularis tendon is intact.   Muscles: No muscle atrophy or edema. No intramuscular fluid collection or hematoma.   Biceps Long Head: Intra-articular long head biceps tendinosis. The extra-articular long head biceps tendon is intact within the bicipital groove.   Acromioclavicular Joint: Mild arthropathy of the acromioclavicular joint. Minimal subacromial/subdeltoid bursal fluid.   Glenohumeral Joint: No joint effusion. Mild chondrosis.   Labrum: Nondisplaced degenerative posterosuperior labral tearing including the biceps labral anchor.   Bones: No fracture or dislocation. No aggressive osseous lesion.   Other: No fluid collection or hematoma.   IMPRESSION: Distal supraspinatus and infraspinatus tendinosis with bursal sided fraying. Likely tiny bursal sided tears of the far anterior fibers of the supraspinatus tendon at the footprint. No high-grade or retracted cuff tear.   Intra-articular long head biceps tendinosis.   Nondisplaced degenerative posterosuperior labral tearing including the biceps labral anchor.   Mild glenohumeral and AC joint arthropathy.     Electronically Signed   By: Maurine Simmering   On: 03/06/2021 10:34  PMFS History: Patient Active Problem List   Diagnosis Date Noted   Biceps tendinosis of right shoulder 06/05/2021   Pain management contract agreement 09/12/2020   GERD (gastroesophageal reflux  disease) 06/23/2019   Cervical neck pain with evidence of disc disease 01/15/2019   Chronic midline thoracic back pain 01/15/2019   Thoracic degenerative disc disease 01/15/2019   History of lumbar discectomy 01/15/2019   DDD (degenerative disc disease), lumbar 01/15/2019   OSA on CPAP 07/04/2018   Overweight (BMI 25.0-29.9) 05/01/2018   Benign prostatic hyperplasia with lower urinary tract symptoms 11/15/2017   Anxiety 09/12/2017   Stopped smoking with greater than 30 pack year history 07/30/2017   Pulmonary lesion 05/16/2017   Alcohol ingestion of more than 4 drinks/week 09/10/2014   Elevated hemoglobin A1c 02/19/2014   Dyslipidemia 02/19/2014   Essential hypertension, benign 02/19/2014   History of colonic polyps 09/23/2009   GERD 12/12/2007   Past Medical History:  Diagnosis Date   Arm fracture    left. Dx'd w/RSD post fracture   Carpal tunnel syndrome    Chest pain 2016   [facility] (Dr. Matthew Saras) doing stress echo, but feels like musculoskeletal chest wall pain is cause of his discomfort   Chronic back pain    COVID-19 06/2020   Deviated septum    Diverticulosis  GERD (gastroesophageal reflux disease)    Hyperlipidemia    Hypertension    Joint pain    Muscle pain    Nephrolithiasis    OSA on CPAP 07/04/2018   - Trial of CPAP therapy on 17 cm H2O with a Large size Fisher[employer]Paykel Full Face Mask Simplus mask and heated humidification.   Plantar fasciitis    Pulmonary nodule 04/2016   Lingula.  Pt chose to repeat CT chest w/out contrast in 3 mo---as per radiologist's recommendations.   Right inguinal hernia    fat-containing.  Small hydrocele in left hemiscrotum.   Snoring    Swelling    of legs/feet/hands   Tobacco dependence in remission     Family History  Problem Relation Age of Onset   Lung cancer Mother    Lung cancer Father    Bone cancer Brother        bone   Heart disease Maternal Uncle    Colon cancer Neg Hx     Past Surgical History:   Procedure Laterality Date   COLONOSCOPY     fracture repair left arm     had a fixator   INGUINAL HERNIA REPAIR Left 2013   repeat hernia repair. x2   KNEE ARTHROSCOPY Right    LUMBAR DISC SURGERY  1993   NEURECTOMY inguinal after hernia repair Left    PLANTAR FASCIA SURGERY Right 2000s   UPPER GI ENDOSCOPY     Social History   Occupational History   Not on file  Tobacco Use   Smoking status: Former    Packs/day: 3.00    Years: 30.00    Pack years: 90.00    Types: Cigarettes    Quit date: 07/16/2002    Years since quitting: 18.9   Smokeless tobacco: Never  Vaping Use   Vaping Use: Never used  Substance and Sexual Activity   Alcohol use: Yes    Alcohol/week: 1.0 - 2.0 standard drink    Types: 1 - 2 Cans of beer per week    Comment: socially   Drug use: No   Sexual activity: Yes

## 2021-06-05 ENCOUNTER — Encounter: Payer: Self-pay | Admitting: Orthopaedic Surgery

## 2021-06-05 DIAGNOSIS — M67813 Other specified disorders of tendon, right shoulder: Secondary | ICD-10-CM | POA: Insufficient documentation

## 2021-06-10 ENCOUNTER — Ambulatory Visit (INDEPENDENT_AMBULATORY_CARE_PROVIDER_SITE_OTHER): Payer: PPO

## 2021-06-10 DIAGNOSIS — Z0000 Encounter for general adult medical examination without abnormal findings: Secondary | ICD-10-CM

## 2021-06-10 NOTE — Progress Notes (Signed)
Subjective:   Nicholas Graham is a 70 y.o. male who presents for an Initial Medicare Annual Wellness Visit. I connected with  Nicholas Graham on 06/10/21 by a audio enabled telemedicine application and verified that I am speaking with the correct person using two identifiers.   I discussed the limitations of evaluation and management by telemedicine. The patient expressed understanding and agreed to proceed.   Location of patient: Home   Location of provider: Office: [facility]       Persons participating in visit:  (patient) and [name], LPN   Review of Systems    Defer to PCP       Objective:    Today's Vitals   06/10/21 0808 06/10/21 0836  PainSc: 3  3   PainLoc: Neck    There is no height or weight on file to calculate BMI.  Advanced Directives 06/10/2021 06/26/2018  Does Patient Have a Medical Advance Directive? No No  Would patient like information on creating a medical advance directive? No - Patient declined No - Patient declined    Current Medications (verified) Outpatient Encounter Medications as of 06/10/2021  Medication Sig   amLODipine (NORVASC) 10 MG tablet Take 1 tablet (10 mg total) by mouth daily.   aspirin 81 MG tablet Take 81 mg by mouth daily.   baclofen (LIORESAL) 20 MG tablet Take 0.5-1 tablets (10-20 mg total) by mouth 3 (three) times daily.   cyclobenzaprine (FLEXERIL) 10 MG tablet Take 1 tablet (10 mg total) by mouth 3 (three) times daily as needed for muscle spasms.   diazepam (VALIUM) 5 MG tablet Take 1 tab PO 1 hour before procedure or imaging.   escitalopram (LEXAPRO) 20 MG tablet Take 1 tablet (20 mg total) by mouth daily.   gabapentin (NEURONTIN) 600 MG tablet Take 1 tablet (600 mg total) by mouth 3 (three) times daily. 1 tab morning, 1.5 tabs afternoon, 1 tab evening,  doses should be every 8 hours.   hydrochlorothiazide (HYDRODIURIL) 25 MG tablet Take 1 tablet (25 mg total) by mouth daily.   lisinopril (ZESTRIL) 10 MG tablet  Take 1 tablet (10 mg total) by mouth daily.   MegaRed Omega-3 Krill Oil 500 MG CAPS Take 2 capsules by mouth daily.    oxyCODONE-acetaminophen (PERCOCET) 10-325 MG tablet Take 1 tablet by mouth every 12 (twelve) hours.   pantoprazole (PROTONIX) 40 MG tablet Take 1 tablet (40 mg total) by mouth daily.   simvastatin (ZOCOR) 10 MG tablet TAKE ONE TABLET BY MOUTH every other DAY   oxyCODONE-acetaminophen (PERCOCET) 10-325 MG tablet Take 1 tablet by mouth every 12 (twelve) hours.   oxyCODONE-acetaminophen (PERCOCET) 10-325 MG tablet Take 1 tablet by mouth every 12 (twelve) hours.   No facility-administered encounter medications on file as of 06/10/2021.    Allergies (verified) Penicillins, Celebrex [celecoxib], Cymbalta [duloxetine hcl], and Lyrica [pregabalin]   History: Past Medical History:  Diagnosis Date   Arm fracture    left. Dx'd w/RSD post fracture   Carpal tunnel syndrome    Chest pain 2016   [facility] (Dr. Matthew Saras) doing stress echo, but feels like musculoskeletal chest wall pain is cause of his discomfort   Chronic back pain    COVID-19 06/2020   Deviated septum    Diverticulosis    GERD (gastroesophageal reflux disease)    Hyperlipidemia    Hypertension    Joint pain    Muscle pain    Nephrolithiasis    OSA on CPAP 07/04/2018   -  Trial of CPAP therapy on 17 cm H2O with a Large size Fisher[employer]Paykel Full Face Mask Simplus mask and heated humidification.   Plantar fasciitis    Pulmonary nodule 04/2016   [place].  Pt chose to repeat CT chest w/out contrast in 3 mo---as per radiologist's recommendations.   Right inguinal hernia    fat-containing.  Small hydrocele in left hemiscrotum.   Snoring    Swelling    of legs/feet/hands   Tobacco dependence in remission    Past Surgical History:  Procedure Laterality Date   COLONOSCOPY     fracture repair left arm     had a fixator   INGUINAL HERNIA REPAIR Left 2013   repeat hernia repair. x2   KNEE ARTHROSCOPY  Right    LUMBAR DISC SURGERY  1993   NEURECTOMY inguinal after hernia repair Left    PLANTAR FASCIA SURGERY Right 2000s   UPPER GI ENDOSCOPY     Family History  Problem Relation Age of Onset   Lung cancer Mother    Lung cancer Father    Bone cancer Brother        bone   Heart disease Maternal Uncle    Colon cancer Neg Hx    Social History   Socioeconomic History   Marital status: Married    Spouse name: Not on file   Number of children: 2   Years of education: Not on file   Highest education level: Not on file  Occupational History   Not on file  Tobacco Use   Smoking status: Former    Packs/day: 3.00    Years: 30.00    Pack years: 90.00    Types: Cigarettes    Quit date: 07/16/2002    Years since quitting: 18.9   Smokeless tobacco: Never  Vaping Use   Vaping Use: Never used  Substance and Sexual Activity   Alcohol use: Yes    Alcohol/week: 1.0 - 2.0 standard drink    Types: 1 - 2 Cans of beer per week    Comment: socially   Drug use: No   Sexual activity: Yes  Other Topics Concern   Not on file  Social History Narrative   Married, [name]. Children (2) Adult, 4 grandchildren.    9 th grade, Retired.   Wears seatbelt   Smoke detector in the home.    Firearms locked in the home.    Feels safe in his relationships.    Social Determinants of Health   Financial Resource Strain: Low Risk    Difficulty of Paying Living Expenses: Not hard at all  Food Insecurity: No Food Insecurity   Worried About Charity fundraiser in the Last Year: Never true   [place] in the Last Year: Never true  Transportation Needs: No Transportation Needs   Lack of Transportation (Medical): No   Lack of Transportation (Non-Medical): No  Physical Activity: Insufficiently Active   Days of Exercise per Week: 6 days   Minutes of Exercise per Session: 20 min  Stress: No Stress Concern Present   Feeling of Stress : Not at all  Social Connections: Moderately Isolated    Frequency of Communication with Friends and Family: More than three times a week   Frequency of Social Gatherings with Friends and Family: Once a week   Attends Religious Services: Never   Marine scientist or Organizations: No   Attends Archivist Meetings: Never   Marital Status: Married    Tobacco  Counseling Counseling given: Not Answered   Clinical Intake:  Pre-visit preparation completed: Yes  Pain : 0-10 Pain Score: 3  Pain Type: Chronic pain Pain Location: Neck Pain Relieving Factors: pain medication; scheduled for surgery 10/10  Pain Relieving Factors: pain medication; scheduled for surgery 10/10  Nutritional Risks: None  How often do you need to have someone help you when you read instructions, pamphlets, or other written materials from your doctor or pharmacy?: 1 - Never What is the last grade level you completed in school?: 10  Diabetic?NO  Interpreter Needed?: No      Activities of Daily Living No flowsheet data found.  Patient Care Team: [name], DO as PCP - General (Family Medicine) [name], MD (Inactive) as Consulting Physician (Gastroenterology)  Indicate any recent Medical Services you may have received from other than [facility] providers in the past year (date may be approximate).     Assessment:   This is a routine wellness examination for Nicholas Graham.  Hearing/Vision screen No results found.  Dietary issues and exercise activities discussed:     Goals Addressed   None    Depression Screen PHQ 2/9 Scores 06/10/2021 07/12/2020 05/31/2020 12/16/2019 06/23/2019 06/19/2018 12/13/2017  PHQ - 2 Score 0 0 0 0 0 0 0  PHQ- 9 Score - - - - - - -    Fall Risk Fall Risk  06/10/2021 07/12/2020 08/10/2019 06/19/2018 05/15/2017  Falls in the past year? 0 0 0 No No  Comment - - Emmi Telephone Survey: data to providers prior to load - -  Number falls in past yr: 0 0 - - -  Injury with Fall? 0 0 - - -    FALL RISK PREVENTION  PERTAINING TO THE HOME:  Any stairs in or around the home? No  If so, are there any without handrails? Yes  Home free of loose throw rugs in walkways, pet beds, electrical cords, etc? No  Adequate lighting in your home to reduce risk of falls? Yes   ASSISTIVE DEVICES UTILIZED TO PREVENT FALLS:  Life alert? No  Use of a cane, walker or w/c? No  Grab bars in the bathroom? Yes  Shower chair or bench in shower? Yes  Elevated toilet seat or a handicapped toilet? No   TIMED UP AND GO:  Was the test performed? No .  Length of time to ambulate 10 feet: n/a sec.     Cognitive Function:     6CIT Screen 06/10/2021  What Year? 0 points  What month? 0 points  What time? 3 points  Count back from 20 0 points  Months in reverse 4 points  Repeat phrase 10 points  Total Score 17    Immunizations  There is no immunization history on file for this patient.  TDAP status: Due, Education has been provided regarding the importance of this vaccine. Advised may receive this vaccine at local pharmacy or Health Dept. Aware to provide a copy of the vaccination record if obtained from local pharmacy or Health Dept. Verbalized acceptance and understanding.  Flu Vaccine status: Declined, Education has been provided regarding the importance of this vaccine but patient still declined. Advised may receive this vaccine at local pharmacy or Health Dept. Aware to provide a copy of the vaccination record if obtained from local pharmacy or Health Dept. Verbalized acceptance and understanding.  Pneumococcal vaccine status: Declined,  Education has been provided regarding the importance of this vaccine but patient still declined. Advised may receive this  vaccine at local pharmacy or Health Dept. Aware to provide a copy of the vaccination record if obtained from local pharmacy or Health Dept. Verbalized acceptance and understanding.   Covid-19 vaccine status: Declined, Education has been provided regarding the  importance of this vaccine but patient still declined. Advised may receive this vaccine at local pharmacy or Health Dept.or vaccine clinic. Aware to provide a copy of the vaccination record if obtained from local pharmacy or Health Dept. Verbalized acceptance and understanding.  Qualifies for Shingles Vaccine? No   Zostavax completed No   Shingrix Completed?: No.    Education has been provided regarding the importance of this vaccine. Patient has been advised to call insurance company to determine out of pocket expense if they have not yet received this vaccine. Advised may also receive vaccine at local pharmacy or Health Dept. Verbalized acceptance and understanding.  Screening Tests Health Maintenance  Topic Date Due   COVID-19 Vaccine (1) Never done   INFLUENZA VACCINE  Never done   Zoster Vaccines- Shingrix (1 of 2) 06/15/2021 (Originally 07/14/1970)   TETANUS/TDAP  09/12/2021 (Originally 07/14/1970)   COLONOSCOPY (Pts 45-4yrs Insurance coverage will need to be confirmed)  06/18/2021   Hepatitis C Screening  Completed   HPV VACCINES  Aged Out    Health Maintenance  Health Maintenance Due  Topic Date Due   COVID-19 Vaccine (1) Never done   INFLUENZA VACCINE  Never done    Colorectal cancer screening: Type of screening: Colonoscopy. Completed 2017. Repeat every 5 years (previous referral to GI placed.  Lung Cancer Screening: (Low Dose CT Chest recommended if Age 86-80 years, 30 pack-year currently smoking OR have quit w/in 15years.) does not qualify.   Lung Cancer Screening Referral: n/a  Additional Screening:  Hepatitis C Screening: does not qualify; Completed 05/2020  Vision Screening: Recommended annual ophthalmology exams for early detection of glaucoma and other disorders of the eye. Is the patient up to date with their annual eye exam?  No  Who is the provider or what is the name of the office in which the patient attends annual eye exams? refused If pt is not  established with a provider, would they like to be referred to a provider to establish care? No .   Dental Screening: Recommended annual dental exams for proper oral hygiene  Community Resource Referral / Chronic Care Management: CRR required this visit?  No   CCM required this visit?  No      Plan:     I have personally reviewed and noted the following in the patient's chart:   Medical and social history Use of alcohol, tobacco or illicit drugs  Current medications and supplements including opioid prescriptions. Patient is currently taking opioid prescriptions. Information provided to patient regarding non-opioid alternatives. Patient advised to discuss non-opioid treatment plan with their provider. Functional ability and status Nutritional status Physical activity Advanced directives List of other physicians Hospitalizations, surgeries, and ER visits in previous 12 months Vitals Screenings to include cognitive, depression, and falls Referrals and appointments  In addition, I have reviewed and discussed with patient certain preventive protocols, quality metrics, and best practice recommendations. A written personalized care plan for preventive services as well as general preventive health recommendations were provided to patient.     [name], LPN   [id number]   Nurse Notes: Non-Face to Face 45 minute visit Encounter.    Nicholas Graham , Thank you for taking time to come for your Medicare Wellness Visit. I appreciate your  ongoing commitment to your health goals. Please review the following plan we discussed and let me know if I can assist you in the future.   These are the goals we discussed:  Goals   None     This is a list of the screening recommended for you and due dates:  Health Maintenance  Topic Date Due   COVID-19 Vaccine (1) Never done   Flu Shot  Never done   Zoster (Shingles) Vaccine (1 of 2) 06/15/2021*   Tetanus Vaccine  09/12/2021*   Colon  Cancer Screening  06/18/2021   Hepatitis C Screening: USPSTF Recommendation to screen - Ages 18-79 yo.  Completed   HPV Vaccine  Aged Out  *Topic was postponed. The date shown is not the original due date.

## 2021-06-12 DIAGNOSIS — H2513 Age-related nuclear cataract, bilateral: Secondary | ICD-10-CM | POA: Diagnosis not present

## 2021-06-21 ENCOUNTER — Other Ambulatory Visit: Payer: Self-pay

## 2021-06-21 ENCOUNTER — Encounter: Payer: Self-pay | Admitting: Family Medicine

## 2021-06-21 ENCOUNTER — Ambulatory Visit (INDEPENDENT_AMBULATORY_CARE_PROVIDER_SITE_OTHER): Payer: PPO | Admitting: Family Medicine

## 2021-06-21 VITALS — BP 107/64 | HR 84 | Temp 98.1°F | Ht 74.0 in | Wt 200.0 lb

## 2021-06-21 DIAGNOSIS — Z9989 Dependence on other enabling machines and devices: Secondary | ICD-10-CM

## 2021-06-21 DIAGNOSIS — K2100 Gastro-esophageal reflux disease with esophagitis, without bleeding: Secondary | ICD-10-CM

## 2021-06-21 DIAGNOSIS — M5136 Other intervertebral disc degeneration, lumbar region: Secondary | ICD-10-CM | POA: Diagnosis not present

## 2021-06-21 DIAGNOSIS — Z125 Encounter for screening for malignant neoplasm of prostate: Secondary | ICD-10-CM | POA: Diagnosis not present

## 2021-06-21 DIAGNOSIS — M5090 Cervical disc disorder, unspecified, unspecified cervical region: Secondary | ICD-10-CM | POA: Diagnosis not present

## 2021-06-21 DIAGNOSIS — Z9889 Other specified postprocedural states: Secondary | ICD-10-CM | POA: Diagnosis not present

## 2021-06-21 DIAGNOSIS — M5134 Other intervertebral disc degeneration, thoracic region: Secondary | ICD-10-CM | POA: Diagnosis not present

## 2021-06-21 DIAGNOSIS — E663 Overweight: Secondary | ICD-10-CM

## 2021-06-21 DIAGNOSIS — F419 Anxiety disorder, unspecified: Secondary | ICD-10-CM | POA: Diagnosis not present

## 2021-06-21 DIAGNOSIS — R7309 Other abnormal glucose: Secondary | ICD-10-CM | POA: Diagnosis not present

## 2021-06-21 DIAGNOSIS — Z0289 Encounter for other administrative examinations: Secondary | ICD-10-CM

## 2021-06-21 DIAGNOSIS — G4733 Obstructive sleep apnea (adult) (pediatric): Secondary | ICD-10-CM

## 2021-06-21 DIAGNOSIS — I10 Essential (primary) hypertension: Secondary | ICD-10-CM

## 2021-06-21 DIAGNOSIS — G8929 Other chronic pain: Secondary | ICD-10-CM

## 2021-06-21 DIAGNOSIS — E785 Hyperlipidemia, unspecified: Secondary | ICD-10-CM

## 2021-06-21 DIAGNOSIS — M51369 Other intervertebral disc degeneration, lumbar region without mention of lumbar back pain or lower extremity pain: Secondary | ICD-10-CM

## 2021-06-21 DIAGNOSIS — M546 Pain in thoracic spine: Secondary | ICD-10-CM | POA: Diagnosis not present

## 2021-06-21 LAB — LIPID PANEL
Cholesterol: 156 mg/dL (ref 0–200)
HDL: 46.9 mg/dL (ref >39.00)
LDL Cholesterol: 94 mg/dL (ref 0–99)
NonHDL: 108.9
Total CHOL/HDL Ratio: 3
Triglycerides: 76 mg/dL (ref 0.0–149.0)
VLDL: 15.2 mg/dL (ref 0.0–40.0)

## 2021-06-21 LAB — COMPREHENSIVE METABOLIC PANEL
ALT: 15 U/L (ref 0–53)
AST: 21 U/L (ref 0–37)
Albumin: 4.3 g/dL (ref 3.5–5.2)
Alkaline Phosphatase: 60 U/L (ref 39–117)
BUN: 15 mg/dL (ref 6–23)
CO2: 30 mEq/L (ref 19–32)
Calcium: 9.7 mg/dL (ref 8.4–10.5)
Chloride: 101 mEq/L (ref 96–112)
Creatinine, Ser: 0.83 mg/dL (ref 0.40–1.50)
GFR: 90.29 mL/min (ref >60.00)
Glucose, Bld: 106 mg/dL — ABNORMAL HIGH (ref 70–99)
Potassium: 4.6 mEq/L (ref 3.5–5.1)
Sodium: 137 mEq/L (ref 135–145)
Total Bilirubin: 1 mg/dL (ref 0.2–1.2)
Total Protein: 6.4 g/dL (ref 6.0–8.3)

## 2021-06-21 LAB — CBC
HCT: 44.4 % (ref 39.0–52.0)
Hemoglobin: 14.9 g/dL (ref 13.0–17.0)
MCHC: 33.6 g/dL (ref 30.0–36.0)
MCV: 92.3 fl (ref 78.0–100.0)
Platelets: 147 10*3/uL — ABNORMAL LOW (ref 150.0–400.0)
RBC: 4.82 Mil/uL (ref 4.22–5.81)
RDW: 13.7 % (ref 11.5–15.5)
WBC: 4.6 10*3/uL (ref 4.0–10.5)

## 2021-06-21 LAB — TSH: TSH: 1.57 u[IU]/mL (ref 0.35–5.50)

## 2021-06-21 LAB — PSA: PSA: 1.96 ng/mL (ref 0.10–4.00)

## 2021-06-21 LAB — HEMOGLOBIN A1C: Hgb A1c MFr Bld: 5.6 % (ref 4.6–6.5)

## 2021-06-21 MED ORDER — BACLOFEN 20 MG PO TABS: 10.0000 mg | ORAL_TABLET | Freq: Three times a day (TID) | ORAL | 1 refills | Status: DC

## 2021-06-21 MED ORDER — OXYCODONE-ACETAMINOPHEN 10-325 MG PO TABS: 1.0000 | ORAL_TABLET | Freq: Two times a day (BID) | ORAL | 0 refills | Status: DC

## 2021-06-21 MED ORDER — SIMVASTATIN 10 MG PO TABS: 10.0000 mg | ORAL_TABLET | ORAL | 1 refills | Status: DC

## 2021-06-21 MED ORDER — GABAPENTIN 600 MG PO TABS: 600.0000 mg | ORAL_TABLET | Freq: Three times a day (TID) | ORAL | 1 refills | Status: DC

## 2021-06-21 MED ORDER — ESCITALOPRAM OXALATE 20 MG PO TABS: 20.0000 mg | ORAL_TABLET | Freq: Every day | ORAL | 1 refills | Status: DC

## 2021-06-21 MED ORDER — PANTOPRAZOLE SODIUM 40 MG PO TBEC: 40.0000 mg | DELAYED_RELEASE_TABLET | Freq: Every day | ORAL | 3 refills | Status: DC

## 2021-06-21 MED ORDER — LISINOPRIL 10 MG PO TABS: 10.0000 mg | ORAL_TABLET | Freq: Every day | ORAL | 1 refills | Status: DC

## 2021-06-21 MED ORDER — AMLODIPINE BESYLATE 10 MG PO TABS: 10.0000 mg | ORAL_TABLET | Freq: Every day | ORAL | 1 refills | Status: DC

## 2021-06-21 MED ORDER — HYDROCHLOROTHIAZIDE 25 MG PO TABS: 25.0000 mg | ORAL_TABLET | Freq: Every day | ORAL | 1 refills | Status: DC

## 2021-06-21 NOTE — Patient Instructions (Signed)
  Great to see you today.  I have refilled the medication(s) we provide.   If labs were collected, we will inform you of lab results once received either by echart message or telephone call.   - echart message- for normal results that have been seen by the patient already.   - telephone call: abnormal results or if patient has not viewed results in their echart.  Good luck on your upcoming surgery.   Call to schedule GI appt.  [facility] Address:  [address], [place],  [address] Phone: [phone]

## 2021-06-21 NOTE — Progress Notes (Signed)
Patient Care Team    Relationship Specialty Notifications Start End  [name], DO PCP - General Family Medicine  10/31/15   [name], MD (Inactive) Consulting Physician Gastroenterology  04/24/16     SUBJECTIVE Chief Complaint  Patient presents with   Pain Management    [place]; pt is fasting    HPI: Nicholas Graham is a 70 y.o. male present for chronic pain management Cervical neck pain with evidence of disc disease/Chronic midline thoracic back pain/Thoracic degenerative disc disease/DDD (degenerative disc disease), lumbar/History of lumbar discectomy Pt presents for his chronic pain management treatment for his cervical and lumbar conditions. His discomfort has progressed since 2020-he feels he has some relief with current medication regimen and he is about to undergo surgery for his cervical radiculopathy.  He has been evaluated  by neuro and pain rehab. He has received injections- that have only provided a few days of temporary relief. He is frustrated, but has appropriate expectations that he will be living with some pain.  He reports he is maintaining a quality of life and able to remain active with Percocet 10-325 (1 tab every 12 hours), baclofen and gabapentin. 600/600/600 (had headaches at increased doses). He states he is walking a few miles a day. Indication for chronic opioid: Cervical spine degeneration, chronic neck and back pain. Medication and dose: Oxycodone-acetaminophen 10-325 1 tab twice daily # pills per month: 60 Last UDS date:10/14/2019> UTD 2021 Pain contract signed (Y/N): Yes Date narcotic database last reviewed (include red flags): 06/21/21 03/08/2021  had epidural injection of cervical spine and he feels it is helpful.   Original note: Lower cervical to mid thoracic pain still present. Using his right arm exacerbates his pain midline of thoracic.  He has a history thoracic spine degeneration and discectomy in his lumbar spine.  Has degenerative  changes, bone spurring and facet arthropathy in his lumbar spine by x-rays obtained 11/2018. Cervical spine x-ray with mild bilateral foraminal narrowing at C3-C4 11/2018. Unfortunately he was reported as allergic or intolerant to many of the medications that would be helpful for him such as gabapentin, Lyrica, Celebrex and Cymbalta --> we discussed this in more detail last visit and he believes that the "allergy-hallucinations " was secondary to having multiple of these medicines on board at the same time back in 2014. We tried to start gabapentin and it did not have any side effects from medication. It has not been extremely helpful  Yet at lower doses. He admits to rather sigmnificant stomach upset starting from the naproxen BID use ober the last 8 week. Advanced imaging has not bee able to be completed 2/2 to covid outbreak. He desired to wait on referral to specialist last visit in hope that he would receive benefit from the gabapentin.   Hypertension/HLD/overweight: Pt reports appliance with amlodipine 10 mg daily, lisinopril 10 mg QD and HCTZ. Patient denies chest pain, shortness of breath, dizziness or lower extremity edema.  Pt takes a daily baby ASA. Pt is  prescribed statin.  Labs UTD Diet: Low-sodium Exercise: Not exercising as routinely. RF: Hypertension, hyperlipidemia, family history of heart disease. Overweight.   ANXIETY:  Patient reports he is well on Lexapro 20 mg daily and would like to continue   Gerd: Patient reports he is still requiring PPI daily use in order to avoid symptoms.  ROS: See pertinent positives and negatives per HPI.  Patient Active Problem List   Diagnosis Date Noted   Biceps tendinosis  of right shoulder 06/05/2021   Pain management contract agreement 09/12/2020   Cervical neck pain with evidence of disc disease 01/15/2019   Chronic midline thoracic back pain 01/15/2019   Thoracic degenerative disc disease 01/15/2019   History of lumbar discectomy  01/15/2019   DDD (degenerative disc disease), lumbar 01/15/2019   OSA on CPAP 07/04/2018   Benign prostatic hyperplasia with lower urinary tract symptoms 11/15/2017   Anxiety 09/12/2017   Stopped smoking with greater than 30 pack year history 07/30/2017   Pulmonary lesion 05/16/2017   Alcohol ingestion of more than 4 drinks/week 09/10/2014   Elevated hemoglobin A1c 02/19/2014   Dyslipidemia 02/19/2014   Essential hypertension, benign 02/19/2014   History of colonic polyps 09/23/2009   GERD 12/12/2007    Social History   Tobacco Use   Smoking status: Former    Packs/day: 3.00    Years: 30.00    Pack years: 90.00    Types: Cigarettes    Quit date: 07/16/2002    Years since quitting: 18.9   Smokeless tobacco: Never  Substance Use Topics   Alcohol use: Yes    Alcohol/week: 1.0 - 2.0 standard drink    Types: 1 - 2 Cans of beer per week    Comment: socially    Current Outpatient Medications:    aspirin 81 MG tablet, Take 81 mg by mouth daily., Disp: , Rfl:    cyclobenzaprine (FLEXERIL) 10 MG tablet, Take 1 tablet (10 mg total) by mouth 3 (three) times daily as needed for muscle spasms., Disp: 90 tablet, Rfl: 1   MegaRed Omega-3 Krill Oil 500 MG CAPS, Take 2 capsules by mouth daily. , Disp: , Rfl:    amLODipine (NORVASC) 10 MG tablet, Take 1 tablet (10 mg total) by mouth daily., Disp: 90 tablet, Rfl: 1   baclofen (LIORESAL) 20 MG tablet, Take 0.5-1 tablets (10-20 mg total) by mouth 3 (three) times daily., Disp: 270 tablet, Rfl: 1   escitalopram (LEXAPRO) 20 MG tablet, Take 1 tablet (20 mg total) by mouth daily., Disp: 90 tablet, Rfl: 1   gabapentin (NEURONTIN) 600 MG tablet, Take 1 tablet (600 mg total) by mouth 3 (three) times daily. 1 tab morning, 1.5 tabs afternoon, 1 tab evening,  doses should be every 8 hours., Disp: 270 tablet, Rfl: 1   hydrochlorothiazide (HYDRODIURIL) 25 MG tablet, Take 1 tablet (25 mg total) by mouth daily., Disp: 90 tablet, Rfl: 1   lisinopril (ZESTRIL) 10  MG tablet, Take 1 tablet (10 mg total) by mouth daily., Disp: 90 tablet, Rfl: 1   oxyCODONE-acetaminophen (PERCOCET) 10-325 MG tablet, Take 1 tablet by mouth every 12 (twelve) hours., Disp: 60 tablet, Rfl: 0   oxyCODONE-acetaminophen (PERCOCET) 10-325 MG tablet, Take 1 tablet by mouth every 12 (twelve) hours., Disp: 60 tablet, Rfl: 0   oxyCODONE-acetaminophen (PERCOCET) 10-325 MG tablet, Take 1 tablet by mouth every 12 (twelve) hours., Disp: 60 tablet, Rfl: 0   pantoprazole (PROTONIX) 40 MG tablet, Take 1 tablet (40 mg total) by mouth daily., Disp: 90 tablet, Rfl: 3   simvastatin (ZOCOR) 10 MG tablet, Take 1 tablet (10 mg total) by mouth every other day., Disp: 45 tablet, Rfl: 1  Allergies  Allergen Reactions   Penicillins Hives   Celebrex [Celecoxib]    Cymbalta [Duloxetine Hcl]    Lyrica [Pregabalin]     OBJECTIVE: BP 107/64   Pulse 84   Temp 98.1 F (36.7 C) (Oral)   Ht '6\' 2"'  (1.88 m)   Wt 200 lb (90.7  kg)   SpO2 97%   BMI 25.68 kg/m   Gen: Afebrile. No acute distress.  Nontoxic, very pleasant male HENT: AT. [place].  No cough or hoarseness Eyes:Pupils Equal Round Reactive to light, Extraocular movements intact,  Conjunctiva without redness, discharge or icterus. Neck/lymp/endocrine: Supple, no lymphadenopathy, no thyromegaly CV: RRR no murmur, no edema, +2/4 P posterior tibialis pulses Chest: CTAB, no wheeze or crackles Skin: No rashes, purpura or petechiae.  Neuro:  Normal gait. PERLA. EOMi. Alert. Orientedx3 Psych: Normal affect, dress and demeanor. Normal speech. Normal thought content and judgment.  ASSESSMENT AND PLAN: CHRISTOHPER DUBE is a 70 y.o. male present for  Cervical neck pain with evidence of disc disease/Chronic midline thoracic back pain/Thoracic degenerative disc disease/DDD (degenerative disc disease), lumbar/History of lumbar discectomy He is following with sports medicine, physical therapy and recently had cervical injections.  encounter every 3 months for pain  management appt only.  - current pain management is helping him maintain activity and improve his quality of life. -[place] controlled substance database reviewed and appropriate 06/21/21  - Pain contract signed > updated 09/12/2020 - UDS > 06/21/2021 completed -Patient discontinued Mobic. -Continue gabapentin  600/600/600> did not tolerate higher dose (headache) -Continue baclofen 10-20 mg 3 times daily -Continue percocet 10-325 mg BID #60 prescribed with 2 refills (held) at pharmacy.  - f/u 3 mos  Essential hypertension, benign/dyslipidemia/overweight Stable -Continue amlodipine 10 mg daily  -Continue lisinopril 10 mg daily - continue HCTZ. -Continue Zocor 10 mg daily.   - continue fish oil. - continue ASA 81 mg - routine diet and exercise. Cbc, cmp, tsh, lipids collected today - F/U 5.5 months with provider on hypertension   Gastroesophageal reflux disease with esophagitis Stable Continue Protonix PRN   Anxiety:  Stable Continue lexapro 20 mg qd.   Elevated hemoglobin A1c - Hemoglobin A1c  Prostate cancer screening - PSA  Orders Placed This Encounter  Procedures   Hemoglobin A1c   PSA   CBC   Comp Met (CMET)   TSH   Lipid panel   Drug Monitoring Panel [phone] , Urine     Meds ordered this encounter  Medications   escitalopram (LEXAPRO) 20 MG tablet    Sig: Take 1 tablet (20 mg total) by mouth daily.    Dispense:  90 tablet    Refill:  1    This prescription was filled on 05/12/2019. Any refills authorized will be placed on file.   baclofen (LIORESAL) 20 MG tablet    Sig: Take 0.5-1 tablets (10-20 mg total) by mouth 3 (three) times daily.    Dispense:  270 tablet    Refill:  1   amLODipine (NORVASC) 10 MG tablet    Sig: Take 1 tablet (10 mg total) by mouth daily.    Dispense:  90 tablet    Refill:  1    This prescription was filled on 06/19/2019. Any refills authorized will be placed on file.   gabapentin (NEURONTIN) 600 MG tablet    Sig: Take 1  tablet (600 mg total) by mouth 3 (three) times daily. 1 tab morning, 1.5 tabs afternoon, 1 tab evening,  doses should be every 8 hours.    Dispense:  270 tablet    Refill:  1    Pt reports not true allergy- was a combination of meds that caused symptoms.   hydrochlorothiazide (HYDRODIURIL) 25 MG tablet    Sig: Take 1 tablet (25 mg total) by mouth daily.    Dispense:  90 tablet    Refill:  1   lisinopril (ZESTRIL) 10 MG tablet    Sig: Take 1 tablet (10 mg total) by mouth daily.    Dispense:  90 tablet    Refill:  1   pantoprazole (PROTONIX) 40 MG tablet    Sig: Take 1 tablet (40 mg total) by mouth daily.    Dispense:  90 tablet    Refill:  3    This prescription was filled on 06/19/2019. Any refills authorized will be placed on file.   simvastatin (ZOCOR) 10 MG tablet    Sig: Take 1 tablet (10 mg total) by mouth every other day.    Dispense:  45 tablet    Refill:  1   oxyCODONE-acetaminophen (PERCOCET) 10-325 MG tablet    Sig: Take 1 tablet by mouth every 12 (twelve) hours.    Dispense:  60 tablet    Refill:  0   oxyCODONE-acetaminophen (PERCOCET) 10-325 MG tablet    Sig: Take 1 tablet by mouth every 12 (twelve) hours.    Dispense:  60 tablet    Refill:  0    May refill ~27 days after prescribed date   oxyCODONE-acetaminophen (PERCOCET) 10-325 MG tablet    Sig: Take 1 tablet by mouth every 12 (twelve) hours.    Dispense:  60 tablet    Refill:  0    May refill ~57 days after prescribed date        Howard Pouch, DO 06/21/2021

## 2021-06-24 LAB — DRUG MONITORING PANEL 376104, URINE
Amphetamines: NEGATIVE ng/mL (ref <500)
Barbiturates: NEGATIVE ng/mL (ref <300)
Benzodiazepines: NEGATIVE ng/mL (ref <100)
Cocaine Metabolite: NEGATIVE ng/mL (ref <150)
Codeine: NEGATIVE ng/mL (ref <50)
Desmethyltramadol: NEGATIVE ng/mL (ref <100)
Hydrocodone: NEGATIVE ng/mL (ref <50)
Hydromorphone: NEGATIVE ng/mL (ref <50)
Morphine: NEGATIVE ng/mL (ref <50)
Norhydrocodone: NEGATIVE ng/mL (ref <50)
Noroxycodone: 887 ng/mL — ABNORMAL HIGH (ref <50)
Opiates: NEGATIVE ng/mL (ref <100)
Oxycodone: 321 ng/mL — ABNORMAL HIGH (ref <50)
Oxycodone: POSITIVE ng/mL — AB (ref <100)
Oxymorphone: 420 ng/mL — ABNORMAL HIGH (ref <50)
Tramadol: NEGATIVE ng/mL (ref <100)

## 2021-06-24 LAB — DM TEMPLATE

## 2021-06-26 ENCOUNTER — Other Ambulatory Visit: Payer: Self-pay | Admitting: Orthopaedic Surgery

## 2021-06-26 DIAGNOSIS — M75121 Complete rotator cuff tear or rupture of right shoulder, not specified as traumatic: Secondary | ICD-10-CM | POA: Diagnosis not present

## 2021-06-26 DIAGNOSIS — M75111 Incomplete rotator cuff tear or rupture of right shoulder, not specified as traumatic: Secondary | ICD-10-CM | POA: Diagnosis not present

## 2021-06-26 DIAGNOSIS — S43431A Superior glenoid labrum lesion of right shoulder, initial encounter: Secondary | ICD-10-CM | POA: Diagnosis not present

## 2021-06-26 DIAGNOSIS — M25711 Osteophyte, right shoulder: Secondary | ICD-10-CM | POA: Diagnosis not present

## 2021-06-26 DIAGNOSIS — M24111 Other articular cartilage disorders, right shoulder: Secondary | ICD-10-CM | POA: Diagnosis not present

## 2021-06-26 DIAGNOSIS — M7521 Bicipital tendinitis, right shoulder: Secondary | ICD-10-CM | POA: Diagnosis not present

## 2021-06-26 DIAGNOSIS — G8918 Other acute postprocedural pain: Secondary | ICD-10-CM | POA: Diagnosis not present

## 2021-06-26 DIAGNOSIS — M67813 Other specified disorders of tendon, right shoulder: Secondary | ICD-10-CM | POA: Diagnosis not present

## 2021-06-26 MED ORDER — OXYCODONE HCL 5 MG PO TABS: 5.0000 mg | ORAL_TABLET | ORAL | 0 refills | Status: DC | PRN

## 2021-06-26 NOTE — Progress Notes (Signed)
Oxy 5-10mg  # 40 sent in for post op pain. Already on percocet 10/325 one po bid chronic pain from Dr. Howard Pouch.  Single Rx from me for post op pain

## 2021-06-30 ENCOUNTER — Other Ambulatory Visit: Payer: Self-pay | Admitting: Family Medicine

## 2021-06-30 NOTE — Telephone Encounter (Signed)
Rx refill request approved per Dr. Corey's orders. 

## 2021-07-04 ENCOUNTER — Encounter: Payer: Self-pay | Admitting: Gastroenterology

## 2021-07-05 ENCOUNTER — Ambulatory Visit (INDEPENDENT_AMBULATORY_CARE_PROVIDER_SITE_OTHER): Payer: PPO | Admitting: Orthopaedic Surgery

## 2021-07-05 ENCOUNTER — Other Ambulatory Visit: Payer: Self-pay

## 2021-07-05 VITALS — BP 126/75 | HR 90

## 2021-07-05 DIAGNOSIS — M67813 Other specified disorders of tendon, right shoulder: Secondary | ICD-10-CM

## 2021-07-05 NOTE — Progress Notes (Signed)
Post-Op Visit Note   Patient: Nicholas Graham           Date of Birth: 03/15/1951           MRN: [id number] Visit Date: 07/05/2021 PCP: [name], DO   Assessment & Plan: Postop rotator cuff repair and biceps tenodesis.  Small rotator cuff tear single anchor used.  He is back on his regular chronic pain medication.  Incision looks good sutures removed.  Recheck 4 weeks.  He can resume his walking program.  Chief Complaint:  Chief Complaint  Patient presents with   Right Shoulder - Routine Post Op    06/26/21 right shoulder scope, RCR, biceps tenodesis    Visit Diagnoses:  1. Biceps tendinosis of right shoulder     Plan: Return 4 weeks.  Anterior posterior arthroscopic portal sutures removed.  Follow-Up Instructions: Return in about 4 weeks (around 08/02/2021).   Orders:  No orders of the defined types were placed in this encounter.  No orders of the defined types were placed in this encounter.   Imaging: No results found.  PMFS History: Patient Active Problem List   Diagnosis Date Noted   Biceps tendinosis of right shoulder 06/05/2021   Pain management contract agreement 09/12/2020   Cervical neck pain with evidence of disc disease 01/15/2019   Chronic midline thoracic back pain 01/15/2019   Thoracic degenerative disc disease 01/15/2019   History of lumbar discectomy 01/15/2019   DDD (degenerative disc disease), lumbar 01/15/2019   OSA on CPAP 07/04/2018   Benign prostatic hyperplasia with lower urinary tract symptoms 11/15/2017   Anxiety 09/12/2017   Stopped smoking with greater than 30 pack year history 07/30/2017   Pulmonary lesion 05/16/2017   Alcohol ingestion of more than 4 drinks/week 09/10/2014   Elevated hemoglobin A1c 02/19/2014   Dyslipidemia 02/19/2014   Essential hypertension, benign 02/19/2014   History of colonic polyps 09/23/2009   GERD 12/12/2007   Past Medical History:  Diagnosis Date   Arm fracture    left. Dx'd w/RSD post  fracture   Carpal tunnel syndrome    Chest pain 2016   [facility] (Dr. Matthew Saras) doing stress echo, but feels like musculoskeletal chest wall pain is cause of his discomfort   Chronic back pain    COVID-19 06/2020   Deviated septum    Diverticulosis    GERD (gastroesophageal reflux disease)    Hyperlipidemia    Hypertension    Joint pain    Muscle pain    Nephrolithiasis    OSA on CPAP 07/04/2018   - Trial of CPAP therapy on 17 cm H2O with a Large size [employer] Full Face Mask Simplus mask and heated humidification.   Plantar fasciitis    Pulmonary nodule 04/2016   [place].  Pt chose to repeat CT chest w/out contrast in 3 mo---as per radiologist's recommendations.   Right inguinal hernia    fat-containing.  Small hydrocele in left hemiscrotum.   Snoring    Swelling    of legs/feet/hands   Tobacco dependence in remission     Family History  Problem Relation Age of Onset   Lung cancer Mother    Lung cancer Father    Bone cancer Brother        bone   Heart disease Maternal Uncle    Colon cancer Neg Hx     Past Surgical History:  Procedure Laterality Date   COLONOSCOPY     fracture repair left arm  had a fixator   INGUINAL HERNIA REPAIR Left 2013   repeat hernia repair. x2   KNEE ARTHROSCOPY Right    LUMBAR DISC SURGERY  1993   NEURECTOMY inguinal after hernia repair Left    PLANTAR FASCIA SURGERY Right 2000s   UPPER GI ENDOSCOPY     Social History   Occupational History   Not on file  Tobacco Use   Smoking status: Former    Packs/day: 3.00    Years: 30.00    Pack years: 90.00    Types: Cigarettes    Quit date: 07/16/2002    Years since quitting: 18.9   Smokeless tobacco: Never  Vaping Use   Vaping Use: Never used  Substance and Sexual Activity   Alcohol use: Yes    Alcohol/week: 1.0 - 2.0 standard drink    Types: 1 - 2 Cans of beer per week    Comment: socially   Drug use: No   Sexual activity: Yes

## 2021-07-14 IMAGING — MR MR CERVICAL SPINE W/O CM
5 series · 38 of 48 positions shown · non-contrast
Comparison: Prior cervical spine radiographs 01/16/2021.

CLINICAL DATA: Neck pain. Right arm pain. Arm weakness. Cervical
radiculitis. Facet joint disease. Cervical radiculopathy, no red
flags. Additional history provided by scanning technologist: Patient
reports right-sided neck and shoulder pain for 3 months. Pain
radiates into right arm with weakness.

EXAM:
MRI CERVICAL SPINE WITHOUT CONTRAST
TECHNIQUE: Multiplanar, multisequence MR imaging of the cervical spine was
performed. No intravenous contrast was administered.

[Series 2: T2 · sagittal · 3.0mm · 0.69mm/px · 6 of 13 slices shown (1 of 2)]
[im 1/13]
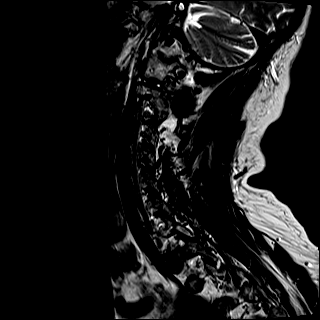
[im 3/13]
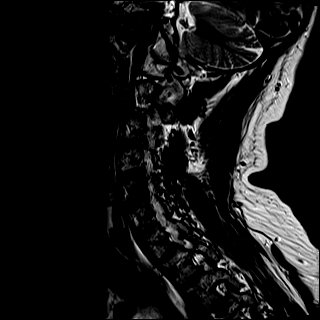
[im 5/13]
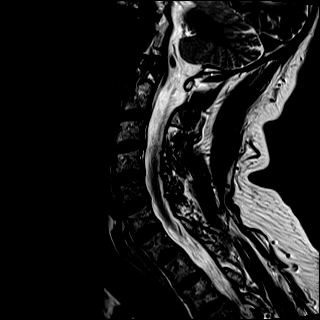
[im 8/13]
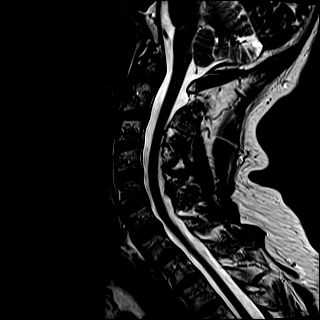
[im 10/13]
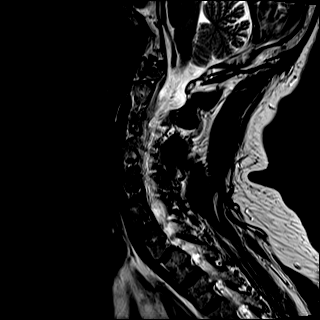
[im 13/13]
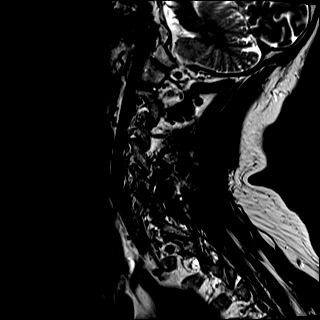

[Series 3: T1 · sagittal · 3.0mm · 0.86mm/px · 6 of 13 slices shown]
[im 1/13]
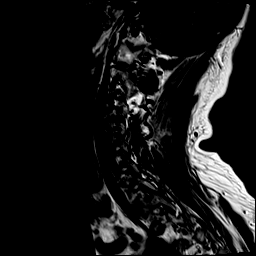
[im 3/13]
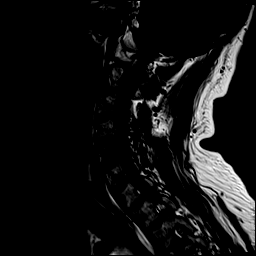
[im 5/13]
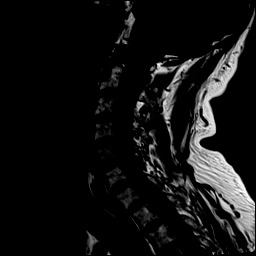
[im 8/13]
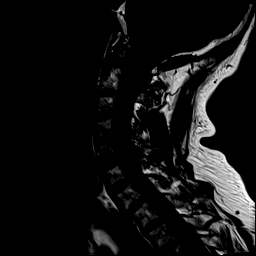
[im 10/13]
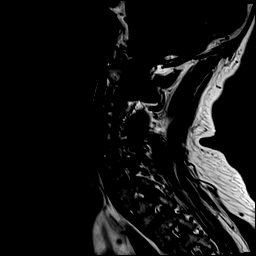
[im 13/13]
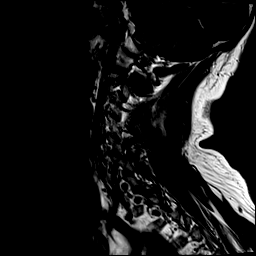

[Series 4: STIR · sagittal · 3.0mm · 0.69mm/px · 6 of 13 slices shown]
[im 1/13]
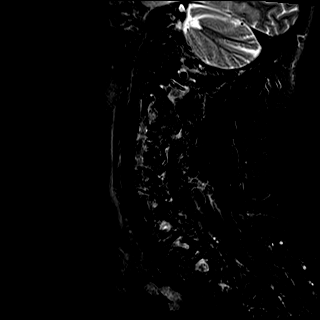
[im 3/13]
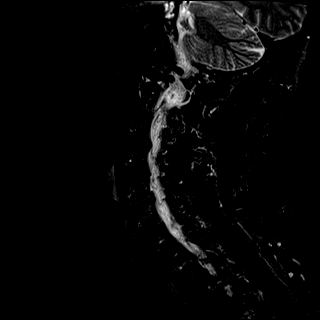
[im 5/13]
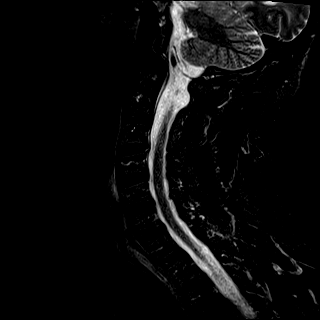
[im 8/13]
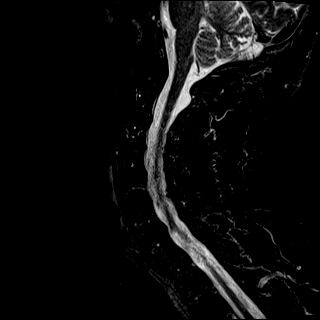
[im 10/13]
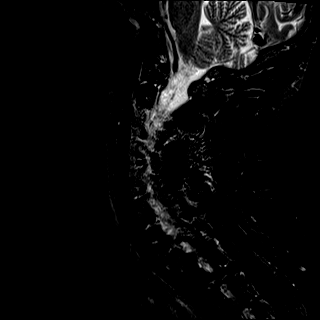
[im 13/13]
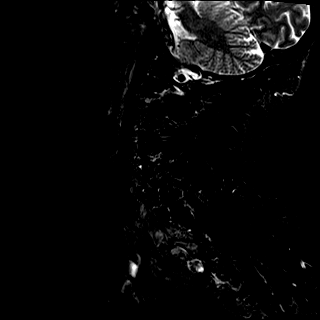

[Series 5: T2 · axial · 3.0mm · 0.62mm/px · z∈[-41,+77]mm · 12 of 32 slices shown (2 of 2)]
[im 1/32]
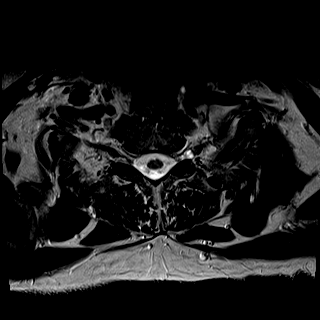
[im 3/32]
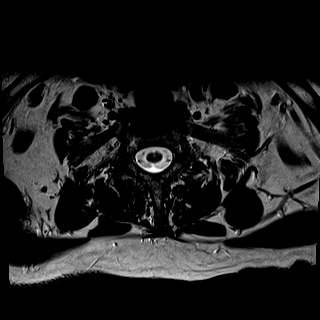
[im 5/32]
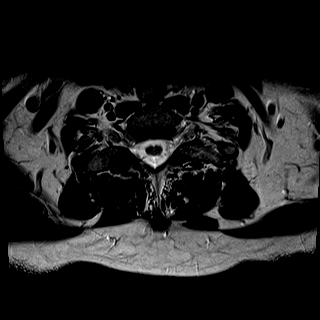
[im 7/32]
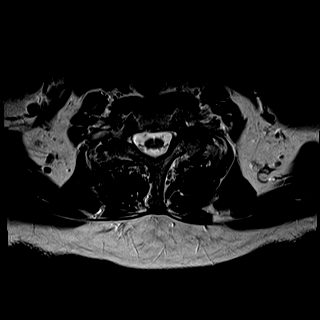
[im 9/32]
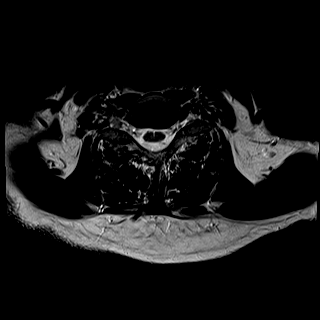
[im 12/32]
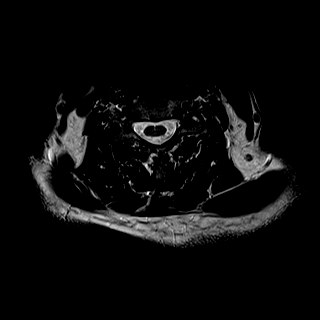
[im 14/32]
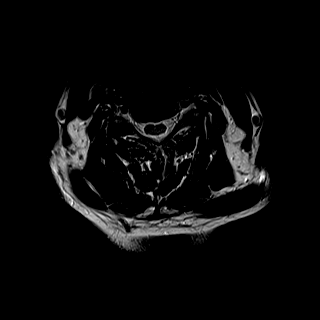
[im 16/32]
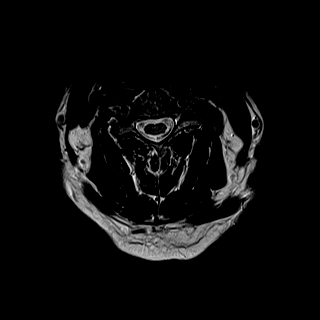
[im 18/32]
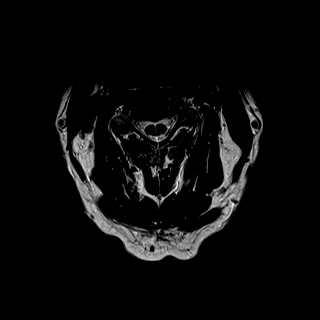
[im 23/32]
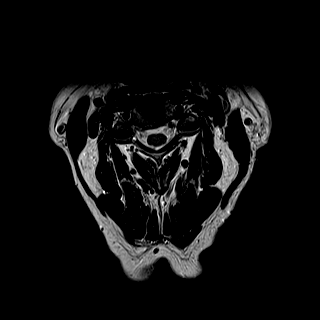
[im 27/32]
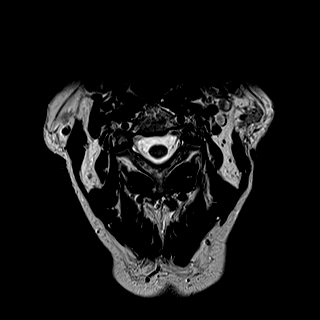
[im 32/32]
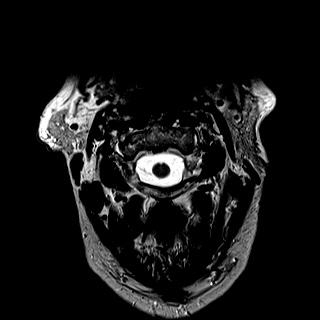

[Series 6: mpgr ax · axial · 3.0mm · 0.35mm/px · z∈[-35,+84]mm · 8 of 32 slices shown]
[im 1/32]
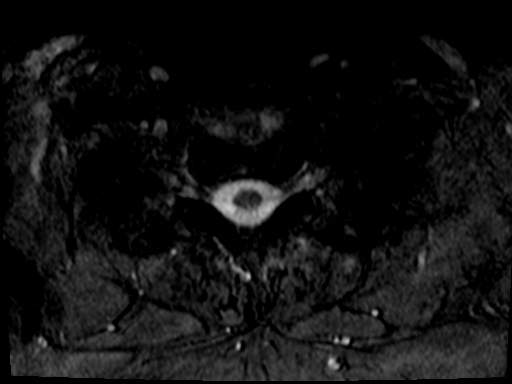
[im 5/32]
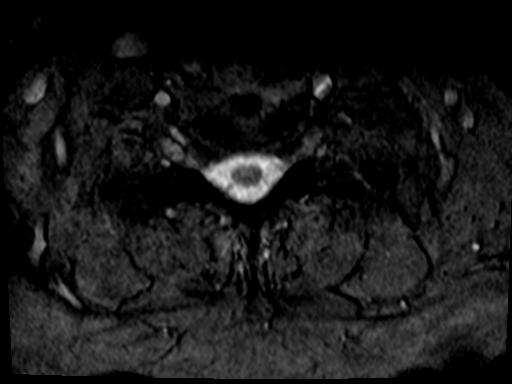
[im 9/32]
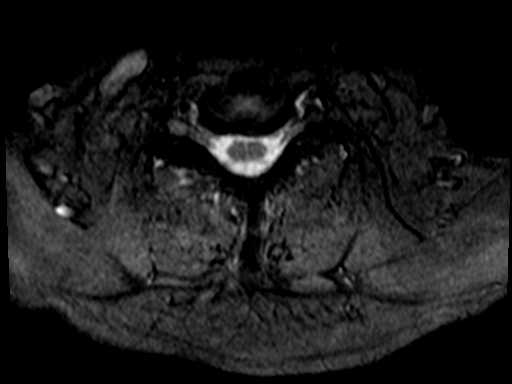
[im 14/32]
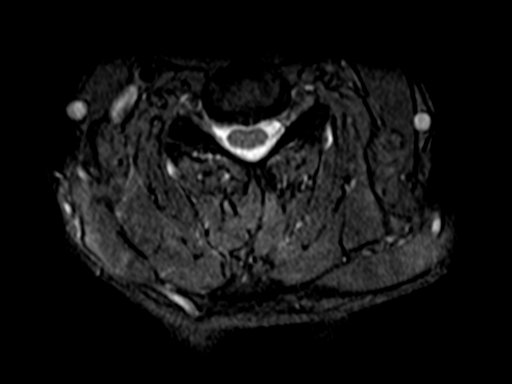
[im 18/32]
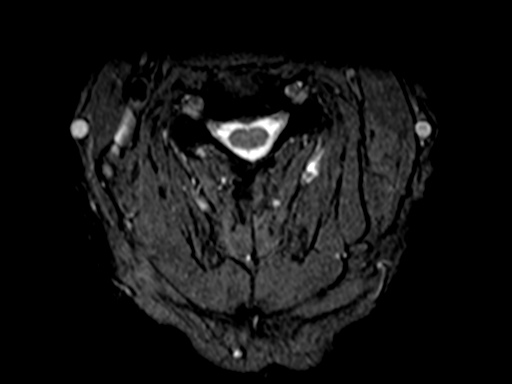
[im 23/32]
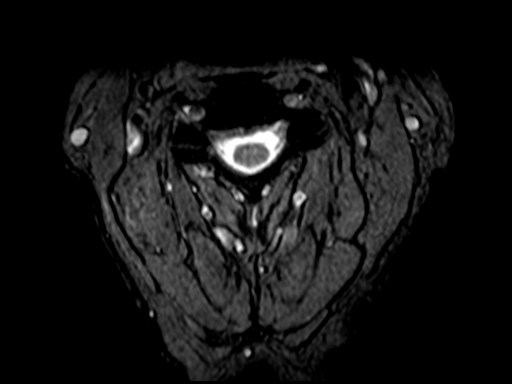
[im 27/32]
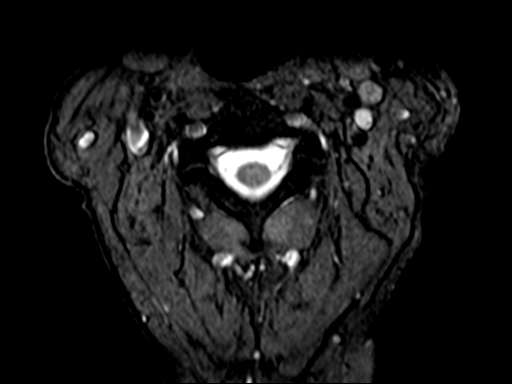
[im 32/32]
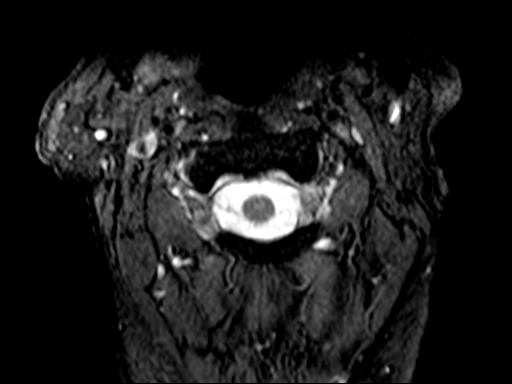

[38 of 48 positions shown; findings below may reference images not displayed]

FINDINGS: Alignment: Trace C3-C4 and C4-C5 grade 1 retrolisthesis. Trace C6-C7
and C7-T1 grade 1 anterolisthesis.

Vertebrae: She will body height is maintained. Mild edema within the
left C6 and C7 articular pillars. Elsewhere, no significant marrow
edema or focal suspicious osseous lesion is identified.

Cord: No spinal cord signal abnormality is identified.

Posterior Fossa, vertebral arteries, paraspinal tissues: No
abnormality identified within included portions of the posterior
fossa. Flow voids preserved within the imaged cervical vertebral
arteries. Paraspinal soft tissues within normal limits.

Disc levels:

No more than mild disc degeneration at any level.

C2-C3: Minimal uncovertebral hypertrophy. No significant disc
herniation, spinal canal stenosis or neural foraminal narrowing.

C3-C4: Posterior annular fissure. Disc bulge. Right greater than
left uncovertebral hypertrophy. Minimal partial effacement of the
ventral thecal sac without spinal cord mass effect. Moderate/severe
right neural foraminal narrowing.

C4-C5: Disc bulge. Superimposed small central disc protrusion.
Uncovertebral hypertrophy on the right. Minimal partial effacement
of the ventral thecal sac without spinal cord mass effect. Bilateral
neural foraminal narrowing (moderate/severe right, mild left).

C5-C6: Disc bulge. Superimposed small central disc protrusion. Mild
uncovertebral hypertrophy on the right. Facet arthrosis with minimal
ligamentum flavum hypertrophy. Mild relative spinal canal narrowing
without spinal cord mass effect. Mild relative right neural
foraminal narrowing.

C6-C7: Shallow disc bulge. Superimposed tiny left center disc
protrusion. Minimal partial effacement of the ventral thecal sac
without spinal cord mass effect. No significant foraminal stenosis.

C7-T1: Facet arthrosis (predominantly on the left). No significant
disc herniation or stenosis.
IMPRESSION: Cervical spondylosis, as outlined. No more than mild relative spinal
canal narrowing at any level. No spinal cord mass effect. Multilevel
neural foraminal narrowing, as detailed and greatest on the right at
C3-C4 and C4-C5 (moderate/severe at these sites).

Degenerative edema within the left C7-T1 articular pillars related
to facet arthrosis at this site.

Trace C3-C4 and C4-C5 grade 1 retrolisthesis.

Trace C6-C7 and C7-T1 grade 1 anterolisthesis.

## 2021-07-22 IMAGING — XA DG INJECT/[PERSON_NAME] INC NEEDLE/CATH/PLC EPI/CERV/THOR W/IMG
2 series · 2 of 2 positions shown · non-contrast
Comparison: none

CLINICAL DATA: Cervical spondylosis without myelopathy. Right
greater than left neck pain radiating to the right shoulder and
upper arm with numbness.

[Series 1: ortho standard · 1 of 1 slices shown (1 of 2)]
[im 1/1]
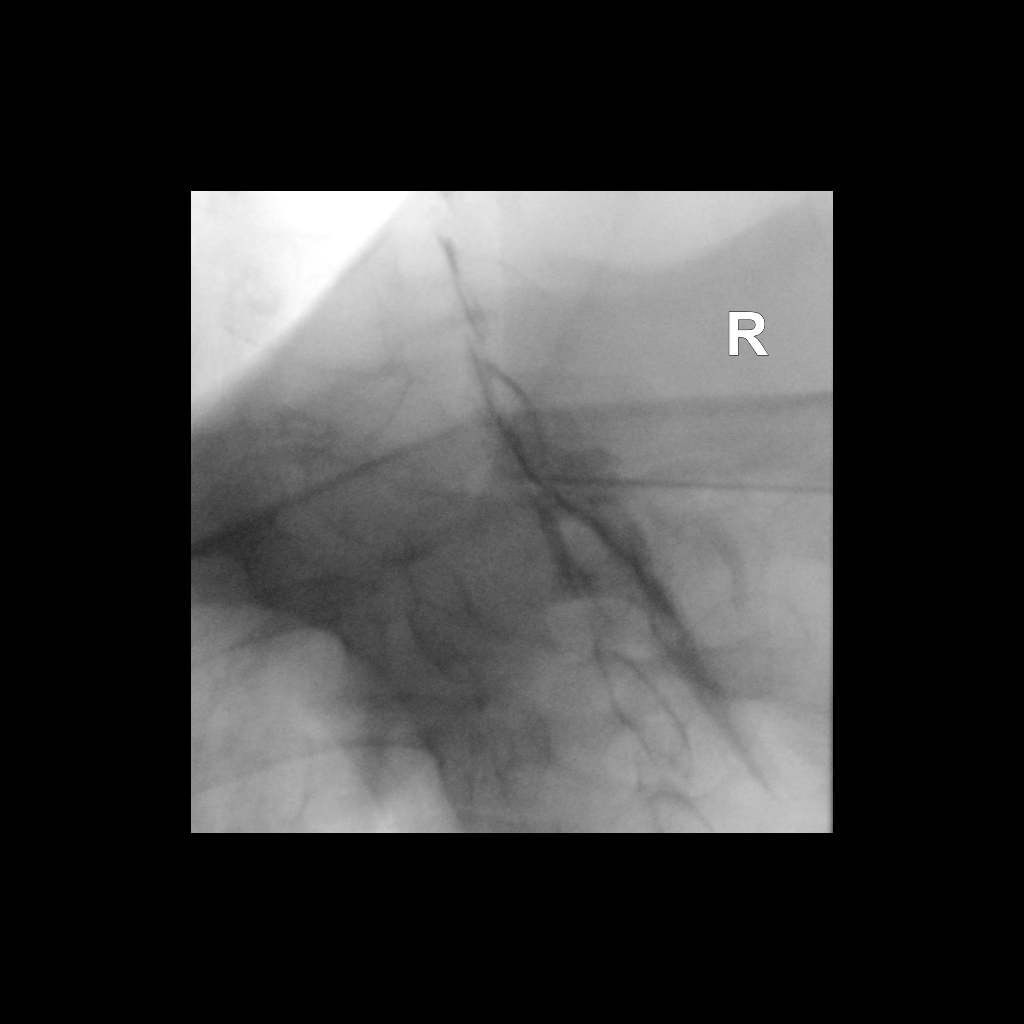

[Series 2: ortho standard · 1 of 1 slices shown (2 of 2)]
[im 1/1]
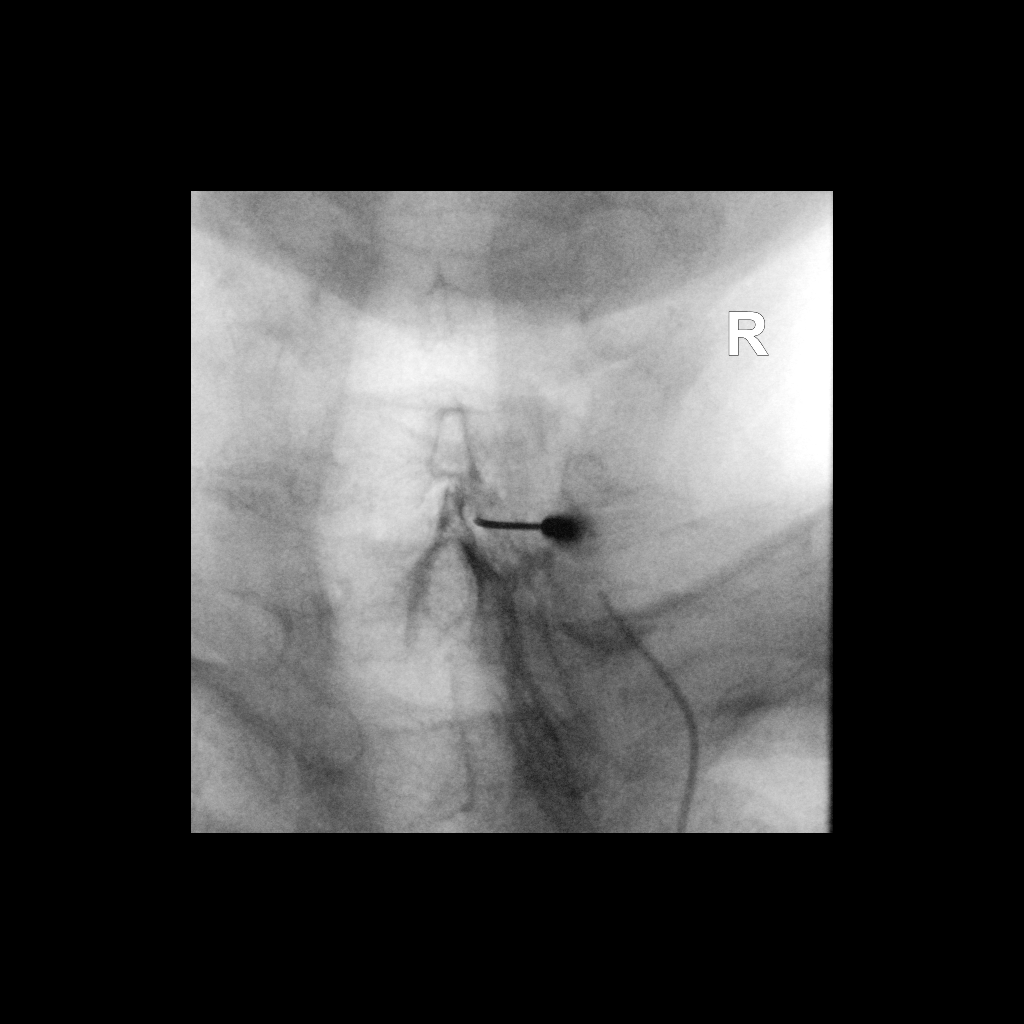

[2 of 2 positions shown; findings below may reference images not displayed]

FLUOROSCOPY TIME:  Fluoroscopy Time: 33 seconds

Radiation Exposure Index: 34.33 microGray*m^2

PROCEDURE:
The procedure, risks, benefits, and alternatives were explained to
the patient. Questions regarding the procedure were encouraged and
answered. The patient understands and consents to the procedure.

CERVICAL EPIDURAL INJECTION

An interlaminar approach was performed on the right at C7-T1. A
inch 20 gauge epidural needle was advanced using loss-of-resistance
technique.

DIAGNOSTIC EPIDURAL INJECTION

Injection of Isovue-M 300 initially demonstrated spread dorsal to
the ligament. The needle was advanced until a second loss of
resistance was obtained, and a subsequent contrast injection showed
a good epidural pattern with spread above and below the level of
needle placement, primarily on the right. No vascular opacification
is seen.

THERAPEUTIC EPIDURAL INJECTION

1.5 ml of Kenalog 40 mixed with 2 ml of normal saline were then
instilled. The procedure was well-tolerated, and the patient was
discharged thirty minutes following the injection in good condition.
IMPRESSION: Technically successful interlaminar epidural injection on the right
at C7-T1.

## 2021-08-02 ENCOUNTER — Encounter: Payer: Self-pay | Admitting: Orthopaedic Surgery

## 2021-08-02 ENCOUNTER — Ambulatory Visit (INDEPENDENT_AMBULATORY_CARE_PROVIDER_SITE_OTHER): Payer: PPO | Admitting: Orthopaedic Surgery

## 2021-08-02 ENCOUNTER — Other Ambulatory Visit: Payer: Self-pay

## 2021-08-02 VITALS — BP 122/73 | HR 86 | Ht 74.0 in | Wt 200.0 lb

## 2021-08-02 DIAGNOSIS — Z9889 Other specified postprocedural states: Secondary | ICD-10-CM | POA: Insufficient documentation

## 2021-08-02 DIAGNOSIS — M67813 Other specified disorders of tendon, right shoulder: Secondary | ICD-10-CM

## 2021-08-02 NOTE — Progress Notes (Signed)
Post-Op Visit Note   Patient: Nicholas Graham           Date of Birth: Feb 11, 1951           MRN: [id number] Visit Date: 08/02/2021 PCP: [name], DO   Assessment & Plan: Post right rotator cuff repair biceps tenodesis.  Patient states before surgery therapy was hurting him a lot and he wants to do his therapy on his own.  He can get his hand to his ear.  We discussed using the pulley a rope over the top of the door, use of a broom stick in spine position with active assistive range of motion etc.  We will check him in a month.  Incision looks good.  He states he noted an area on the proximal edge of the incision that was slightly blanched of white which is where the subcutaneous Vicryl sutures present but is not sticking through the skin.  Discussed with patient and his wife that the suture will dissolve and resorb and nothing needs to be done about that.  Port incisions look good good cervical range of motion.  Recheck 1 month.  Chief Complaint:  Chief Complaint  Patient presents with   Right Shoulder - Follow-up    06/26/2021 right shoulder arthroscopy, RCR, biceps tenodesis   Visit Diagnoses:  1. Biceps tendinosis of right shoulder   2. S/P right rotator cuff repair     Plan: ROV one month  Follow-Up Instructions: Return in about 1 month (around 09/01/2021).   Orders:  No orders of the defined types were placed in this encounter.  No orders of the defined types were placed in this encounter.   Imaging: No results found.  PMFS History: Patient Active Problem List   Diagnosis Date Noted   S/P right rotator cuff repair 08/02/2021   Biceps tendinosis of right shoulder 06/05/2021   Pain management contract agreement 09/12/2020   Cervical neck pain with evidence of disc disease 01/15/2019   Chronic midline thoracic back pain 01/15/2019   Thoracic degenerative disc disease 01/15/2019   History of lumbar discectomy 01/15/2019   DDD (degenerative disc disease), lumbar  01/15/2019   OSA on CPAP 07/04/2018   Benign prostatic hyperplasia with lower urinary tract symptoms 11/15/2017   Anxiety 09/12/2017   Stopped smoking with greater than 30 pack year history 07/30/2017   Pulmonary lesion 05/16/2017   Alcohol ingestion of more than 4 drinks/week 09/10/2014   Elevated hemoglobin A1c 02/19/2014   Dyslipidemia 02/19/2014   Essential hypertension, benign 02/19/2014   History of colonic polyps 09/23/2009   GERD 12/12/2007   Past Medical History:  Diagnosis Date   Arm fracture    left. Dx'd w/RSD post fracture   Carpal tunnel syndrome    Chest pain 2016   [facility] (Dr. Matthew Saras) doing stress echo, but feels like musculoskeletal chest wall pain is cause of his discomfort   Chronic back pain    COVID-19 06/2020   Deviated septum    Diverticulosis    GERD (gastroesophageal reflux disease)    Hyperlipidemia    Hypertension    Joint pain    Muscle pain    Nephrolithiasis    OSA on CPAP 07/04/2018   - Trial of CPAP therapy on 17 cm H2O with a Large size Fisher&Paykel Full Face Mask Simplus mask and heated humidification.   Plantar fasciitis    Pulmonary nodule 04/2016   Lingula.  Pt chose to repeat CT chest w/out contrast in 3  mo---as per radiologist's recommendations.   Right inguinal hernia    fat-containing.  Small hydrocele in left hemiscrotum.   Snoring    Swelling    of legs/feet/hands   Tobacco dependence in remission     Family History  Problem Relation Age of Onset   Lung cancer Mother    Lung cancer Father    Bone cancer Brother        bone   Heart disease Maternal Uncle    Colon cancer Neg Hx     Past Surgical History:  Procedure Laterality Date   COLONOSCOPY     fracture repair left arm     had a fixator   INGUINAL HERNIA REPAIR Left 2013   repeat hernia repair. x2   KNEE ARTHROSCOPY Right    LUMBAR DISC SURGERY  1993   NEURECTOMY inguinal after hernia repair Left    PLANTAR FASCIA SURGERY Right 2000s   UPPER GI  ENDOSCOPY     Social History   Occupational History   Not on file  Tobacco Use   Smoking status: Former    Packs/day: 3.00    Years: 30.00    Pack years: 90.00    Types: Cigarettes    Quit date: 07/16/2002    Years since quitting: 19.0   Smokeless tobacco: Never  Vaping Use   Vaping Use: Never used  Substance and Sexual Activity   Alcohol use: Yes    Alcohol/week: 1.0 - 2.0 standard drink    Types: 1 - 2 Cans of beer per week    Comment: socially   Drug use: No   Sexual activity: Yes

## 2021-08-28 ENCOUNTER — Other Ambulatory Visit: Payer: Self-pay | Admitting: Family Medicine

## 2021-09-01 ENCOUNTER — Other Ambulatory Visit: Payer: Self-pay

## 2021-09-01 ENCOUNTER — Encounter: Payer: Self-pay | Admitting: Orthopaedic Surgery

## 2021-09-01 ENCOUNTER — Ambulatory Visit (INDEPENDENT_AMBULATORY_CARE_PROVIDER_SITE_OTHER): Payer: PPO | Admitting: Orthopaedic Surgery

## 2021-09-01 VITALS — BP 115/68 | HR 82 | Ht 74.0 in | Wt 200.0 lb

## 2021-09-01 DIAGNOSIS — Z9889 Other specified postprocedural states: Secondary | ICD-10-CM

## 2021-09-01 NOTE — Progress Notes (Signed)
Post-Op Visit Note   Patient: Nicholas Graham           Date of Birth: 27-Apr-1951           MRN: [id number] Visit Date: 09/01/2021 PCP: [name], DO   Assessment & Plan: Follow-up rotator cuff repair biceps tenodesis.  Patient wanted do his own therapy since previous experience with therapy was extremely painful.  He can get his arm up over his head still lacks 6 to 7 inches reaching as high as his opposite shoulder.  He uses a stick in the supine position has a pulley that he uses daily.  I will recheck him in 2 months.  He is getting progressive improvement in his range of motion.  Chief Complaint:  Chief Complaint  Patient presents with   Right Shoulder - Follow-up    06/26/2021 right shoulder arthroscopy, RCR, biceps tenodesis   Visit Diagnoses:  1. S/P right rotator cuff repair     Plan: Return 2 months.  He will continue to work on his own home exercise program.  Follow-Up Instructions: Return in about 2 months (around 11/02/2021).   Orders:  No orders of the defined types were placed in this encounter.  No orders of the defined types were placed in this encounter.   Imaging: No results found.  PMFS History: Patient Active Problem List   Diagnosis Date Noted   S/P right rotator cuff repair 08/02/2021   Biceps tendinosis of right shoulder 06/05/2021   Pain management contract agreement 09/12/2020   Cervical neck pain with evidence of disc disease 01/15/2019   Chronic midline thoracic back pain 01/15/2019   Thoracic degenerative disc disease 01/15/2019   History of lumbar discectomy 01/15/2019   DDD (degenerative disc disease), lumbar 01/15/2019   OSA on CPAP 07/04/2018   Benign prostatic hyperplasia with lower urinary tract symptoms 11/15/2017   Anxiety 09/12/2017   Stopped smoking with greater than 30 pack year history 07/30/2017   Pulmonary lesion 05/16/2017   Alcohol ingestion of more than 4 drinks/week 09/10/2014   Elevated hemoglobin A1c  02/19/2014   Dyslipidemia 02/19/2014   Essential hypertension, benign 02/19/2014   History of colonic polyps 09/23/2009   GERD 12/12/2007   Past Medical History:  Diagnosis Date   Arm fracture    left. Dx'd w/RSD post fracture   Carpal tunnel syndrome    Chest pain 2016   [facility] (Dr. Matthew Saras) doing stress echo, but feels like musculoskeletal chest wall pain is cause of his discomfort   Chronic back pain    COVID-19 06/2020   Deviated septum    Diverticulosis    GERD (gastroesophageal reflux disease)    Hyperlipidemia    Hypertension    Joint pain    Muscle pain    Nephrolithiasis    OSA on CPAP 07/04/2018   - Trial of CPAP therapy on 17 cm H2O with a Large size Fisher&Paykel Full Face Mask Simplus mask and heated humidification.   Plantar fasciitis    Pulmonary nodule 04/2016   Lingula.  Pt chose to repeat CT chest w/out contrast in 3 mo---as per radiologist's recommendations.   Right inguinal hernia    fat-containing.  Small hydrocele in left hemiscrotum.   Snoring    Swelling    of legs/feet/hands   Tobacco dependence in remission     Family History  Problem Relation Age of Onset   Lung cancer Mother    Lung cancer Father    Bone cancer Brother  bone   Heart disease Maternal Uncle    Colon cancer Neg Hx     Past Surgical History:  Procedure Laterality Date   COLONOSCOPY     fracture repair left arm     had a fixator   INGUINAL HERNIA REPAIR Left 2013   repeat hernia repair. x2   KNEE ARTHROSCOPY Right    LUMBAR Kirk inguinal after hernia repair Left    PLANTAR FASCIA SURGERY Right 2000s   UPPER GI ENDOSCOPY     Social History   Occupational History   Not on file  Tobacco Use   Smoking status: Former    Packs/day: 3.00    Years: 30.00    Pack years: 90.00    Types: Cigarettes    Quit date: 07/16/2002    Years since quitting: 19.1   Smokeless tobacco: Never  Vaping Use   Vaping Use: Never used   Substance and Sexual Activity   Alcohol use: Yes    Alcohol/week: 1.0 - 2.0 standard drink    Types: 1 - 2 Cans of beer per week    Comment: socially   Drug use: No   Sexual activity: Yes

## 2021-09-13 ENCOUNTER — Encounter: Payer: Self-pay | Admitting: Family Medicine

## 2021-09-13 ENCOUNTER — Other Ambulatory Visit: Payer: Self-pay

## 2021-09-13 ENCOUNTER — Ambulatory Visit (INDEPENDENT_AMBULATORY_CARE_PROVIDER_SITE_OTHER): Payer: PPO | Admitting: Family Medicine

## 2021-09-13 VITALS — BP 124/69 | HR 100 | Temp 97.8°F | Ht 74.0 in | Wt 212.0 lb

## 2021-09-13 DIAGNOSIS — M51369 Other intervertebral disc degeneration, lumbar region without mention of lumbar back pain or lower extremity pain: Secondary | ICD-10-CM

## 2021-09-13 DIAGNOSIS — N401 Enlarged prostate with lower urinary tract symptoms: Secondary | ICD-10-CM | POA: Diagnosis not present

## 2021-09-13 DIAGNOSIS — E785 Hyperlipidemia, unspecified: Secondary | ICD-10-CM | POA: Diagnosis not present

## 2021-09-13 DIAGNOSIS — L989 Disorder of the skin and subcutaneous tissue, unspecified: Secondary | ICD-10-CM

## 2021-09-13 DIAGNOSIS — M546 Pain in thoracic spine: Secondary | ICD-10-CM

## 2021-09-13 DIAGNOSIS — Z0289 Encounter for other administrative examinations: Secondary | ICD-10-CM

## 2021-09-13 DIAGNOSIS — F419 Anxiety disorder, unspecified: Secondary | ICD-10-CM

## 2021-09-13 DIAGNOSIS — M5134 Other intervertebral disc degeneration, thoracic region: Secondary | ICD-10-CM

## 2021-09-13 DIAGNOSIS — Z9889 Other specified postprocedural states: Secondary | ICD-10-CM

## 2021-09-13 DIAGNOSIS — Z789 Other specified health status: Secondary | ICD-10-CM | POA: Diagnosis not present

## 2021-09-13 DIAGNOSIS — G8929 Other chronic pain: Secondary | ICD-10-CM

## 2021-09-13 DIAGNOSIS — M5136 Other intervertebral disc degeneration, lumbar region: Secondary | ICD-10-CM | POA: Diagnosis not present

## 2021-09-13 DIAGNOSIS — I10 Essential (primary) hypertension: Secondary | ICD-10-CM

## 2021-09-13 DIAGNOSIS — M5090 Cervical disc disorder, unspecified, unspecified cervical region: Secondary | ICD-10-CM

## 2021-09-13 MED ORDER — OXYCODONE-ACETAMINOPHEN 10-325 MG PO TABS: 1.0000 | ORAL_TABLET | Freq: Two times a day (BID) | ORAL | 0 refills | Status: DC

## 2021-09-13 NOTE — Progress Notes (Signed)
Patient Care Team    Relationship Specialty Notifications Start End  [name], DO PCP - General Family Medicine  10/31/15   [name], MD (Inactive) Consulting Physician Gastroenterology  04/24/16     SUBJECTIVE Chief Complaint  Patient presents with   Pain Management    [place]; pt is not fasting    HPI: Nicholas Graham is a 70 y.o. male present for chronic pain management Cervical neck pain with evidence of disc disease/Chronic midline thoracic back pain/Thoracic degenerative disc disease/DDD (degenerative disc disease), lumbar/History of lumbar discectomy Pt presents for his chronic pain management treatment for his cervical and lumbar conditions. His discomfort has progressed since 2020-he feels he has some relief with current medication regimen .He has been evaluated  by neuro and pain rehab. He has received injections- that have only provided a few days of temporary relief. He is frustrated, but has appropriate expectations that he will be living with some pain.  He reports he is maintaining a quality of life and able to remain active with Percocet 10-325 (1 tab every 12 hours), baclofen and gabapentin. 600/600/600 (had headaches at increased doses). He states he is walking a few miles a day. Indication for chronic opioid: Cervical spine degeneration, chronic neck and back pain. Medication and dose: Oxycodone-acetaminophen 10-325 1 tab twice daily # pills per month: 60 Last UDS date:UYD 06/2021 Pain contract signed (Y/N): Yes- UTD 08/2021 Date narcotic database last reviewed (/include red flags): 09/13/21 03/08/2021  had epidural injection of cervical spine and he feels it is helpful.   Original note: Lower cervical to mid thoracic pain still present. Using his right arm exacerbates his pain midline of thoracic.  He has a history thoracic spine degeneration and discectomy in his lumbar spine.  Has degenerative changes, bone spurring and facet arthropathy in his lumbar  spine by x-rays obtained 11/2018. Cervical spine x-ray with mild bilateral foraminal narrowing at C3-C4 11/2018. Unfortunately he was reported as allergic or intolerant to many of the medications that would be helpful for him such as gabapentin, Lyrica, Celebrex and Cymbalta --> we discussed this in more detail last visit and he believes that the "allergy-hallucinations " was secondary to having multiple of these medicines on board at the same time back in 2014. We tried to start gabapentin and it did not have any side effects from medication. It has not been extremely helpful  Yet at lower doses. He admits to rather sigmnificant stomach upset starting from the naproxen BID use ober the last 8 week. Advanced imaging has not bee able to be completed 2/2 to covid outbreak. He desired to wait on referral to specialist last visit in hope that he would receive benefit from the gabapentin.   Hypertension/HLD/overweight: Pt reports compliance with amlodipine 10 mg daily, lisinopril 10 mg QD and HCTZ. Patient denies chest pain, shortness of breath, dizziness or lower extremity edema.   Pt takes a daily baby ASA. Pt is  prescribed statin.  Labs UTD Diet: Low-sodium Exercise: Not exercising as routinely. RF: Hypertension, hyperlipidemia, family history of heart disease. Overweight.   ANXIETY:  Patient reports he feels well on Lexapro 20 mg daily and would like to continue   Gerd: Patient reports he is still requiring PPI daily use in order to avoid symptoms.  ROS: See pertinent positives and negatives per HPI.  Patient Active Problem List   Diagnosis Date Noted   S/P right rotator cuff repair 08/02/2021   Biceps tendinosis of  right shoulder 06/05/2021   Pain management contract agreement 09/12/2020   Cervical neck pain with evidence of disc disease 01/15/2019   Chronic midline thoracic back pain 01/15/2019   Thoracic degenerative disc disease 01/15/2019   History of lumbar discectomy 01/15/2019   DDD  (degenerative disc disease), lumbar 01/15/2019   OSA on CPAP 07/04/2018   Benign prostatic hyperplasia with lower urinary tract symptoms 11/15/2017   Anxiety 09/12/2017   Stopped smoking with greater than 30 pack year history 07/30/2017   Pulmonary lesion 05/16/2017   Alcohol ingestion of more than 4 drinks/week 09/10/2014   Elevated hemoglobin A1c 02/19/2014   Dyslipidemia 02/19/2014   Essential hypertension, benign 02/19/2014   History of colonic polyps 09/23/2009   GERD 12/12/2007    Social History   Tobacco Use   Smoking status: Former    Packs/day: 3.00    Years: 30.00    Pack years: 90.00    Types: Cigarettes    Quit date: 07/16/2002    Years since quitting: 19.1   Smokeless tobacco: Never  Substance Use Topics   Alcohol use: Yes    Alcohol/week: 1.0 - 2.0 standard drink    Types: 1 - 2 Cans of beer per week    Comment: socially    Current Outpatient Medications:    amLODipine (NORVASC) 10 MG tablet, Take 1 tablet (10 mg total) by mouth daily., Disp: 90 tablet, Rfl: 1   aspirin 81 MG tablet, Take 81 mg by mouth daily., Disp: , Rfl:    baclofen (LIORESAL) 20 MG tablet, Take 0.5-1 tablets (10-20 mg total) by mouth 3 (three) times daily., Disp: 270 tablet, Rfl: 1   cyclobenzaprine (FLEXERIL) 10 MG tablet, TAKE ONE TABLET BY MOUTH THREE TIMES DAILY AS NEEDED FOR MUSCLE SPASMS, Disp: 90 tablet, Rfl: 1   escitalopram (LEXAPRO) 20 MG tablet, Take 1 tablet (20 mg total) by mouth daily., Disp: 90 tablet, Rfl: 1   gabapentin (NEURONTIN) 600 MG tablet, Take 1 tablet (600 mg total) by mouth 3 (three) times daily. 1 tab morning, 1.5 tabs afternoon, 1 tab evening,  doses should be every 8 hours., Disp: 270 tablet, Rfl: 1   hydrochlorothiazide (HYDRODIURIL) 25 MG tablet, Take 1 tablet (25 mg total) by mouth daily., Disp: 90 tablet, Rfl: 1   lisinopril (ZESTRIL) 10 MG tablet, Take 1 tablet (10 mg total) by mouth daily., Disp: 90 tablet, Rfl: 1   MegaRed Omega-3 Krill Oil 500 MG CAPS,  Take 2 capsules by mouth daily. , Disp: , Rfl:    pantoprazole (PROTONIX) 40 MG tablet, Take 1 tablet (40 mg total) by mouth daily., Disp: 90 tablet, Rfl: 3   simvastatin (ZOCOR) 10 MG tablet, Take 1 tablet (10 mg total) by mouth every other day., Disp: 45 tablet, Rfl: 1   oxyCODONE-acetaminophen (PERCOCET) 10-325 MG tablet, Take 1 tablet by mouth every 12 (twelve) hours., Disp: 60 tablet, Rfl: 0   oxyCODONE-acetaminophen (PERCOCET) 10-325 MG tablet, Take 1 tablet by mouth every 12 (twelve) hours., Disp: 60 tablet, Rfl: 0   oxyCODONE-acetaminophen (PERCOCET) 10-325 MG tablet, Take 1 tablet by mouth every 12 (twelve) hours., Disp: 60 tablet, Rfl: 0  Allergies  Allergen Reactions   Penicillins Hives   Celebrex [Celecoxib]    Cymbalta [Duloxetine Hcl]    Lyrica [Pregabalin]     OBJECTIVE: BP 124/69    Pulse 100    Temp 97.8 F (36.6 C) (Oral)    Ht 6\' 2"  (1.88 m)    Wt 212 lb (96.2 kg)  SpO2 98%    BMI 27.22 kg/m   Physical Exam Vitals and nursing note reviewed.  Constitutional:      General: He is not in acute distress.    Appearance: Normal appearance. He is not ill-appearing, toxic-appearing or diaphoretic.  HENT:     Head: Normocephalic and atraumatic.     Mouth/Throat:     Mouth: Mucous membranes are moist.  Eyes:     General: No scleral icterus.       Right eye: No discharge.        Left eye: No discharge.     Extraocular Movements: Extraocular movements intact.     Pupils: Pupils are equal, round, and reactive to light.  Cardiovascular:     Rate and Rhythm: Normal rate and regular rhythm.  Pulmonary:     Effort: Pulmonary effort is normal. No respiratory distress.     Breath sounds: Normal breath sounds. No wheezing, rhonchi or rales.  Musculoskeletal:     Cervical back: Neck supple.  Lymphadenopathy:     Cervical: No cervical adenopathy.  Skin:    General: Skin is warm and dry.     Coloration: Skin is not jaundiced or pale.     Findings: No rash.  Neurological:      Mental Status: He is alert and oriented to person, place, and time. Mental status is at baseline.  Psychiatric:        Mood and Affect: Mood normal.        Behavior: Behavior normal.        Thought Content: Thought content normal.        Judgment: Judgment normal.     ASSESSMENT AND PLAN: Nicholas Graham is a 70 y.o. male present for  Cervical neck pain with evidence of disc disease/Chronic midline thoracic back pain/Thoracic degenerative disc disease/DDD (degenerative disc disease), lumbar/History of lumbar discectomy He is following with sports medicine, physical therapy and recently had cervical injections.  encounter every 3 months for pain management appt only.  - current pain management is helping him maintain activity and improve his quality of life. -[place] controlled substance database reviewed and appropriate 09/13/21  - Pain contract signed > updated 09/12/2020 - UDS > 06/21/2021 completed -Patient discontinued Mobic. -continue  gabapentin  600/600/600> did not tolerate higher dose (headache) -continue baclofen 10-20 mg 3 times daily -continue  percocet 10-325 mg BID #60 prescribed with 2 refills (held) at pharmacy.  - f/u 3 mos   Essential hypertension, benign/dyslipidemia/overweight Stable -continue  amlodipine 10 mg daily  - continue  lisinopril 10 mg daily - continue HCTZ. -Continue Zocor 10 mg daily.   - continue fish oil. - continue ASA 81 mg - routine diet and exercise. F/u q 3 mos with other conditions.    Gastroesophageal reflux disease with esophagitis stable Continue Protonix PRN   Anxiety:  Stable.  continue lexapro 20 mg qd.     Return in about 11 weeks (around 11/29/2021) for [place] (30 min).  Orders Placed This Encounter  Procedures   Ambulatory referral to Dermatology     Meds ordered this encounter  Medications   oxyCODONE-acetaminophen (PERCOCET) 10-325 MG tablet    Sig: Take 1 tablet by mouth every 12 (twelve) hours.     Dispense:  60 tablet    Refill:  0    May refill ~57 days after prescribed date   oxyCODONE-acetaminophen (PERCOCET) 10-325 MG tablet    Sig: Take 1 tablet by mouth every 12 (twelve) hours.  Dispense:  60 tablet    Refill:  0    May refill ~27 days after prescribed date   oxyCODONE-acetaminophen (PERCOCET) 10-325 MG tablet    Sig: Take 1 tablet by mouth every 12 (twelve) hours.    Dispense:  60 tablet    Refill:  [place], DO 09/13/2021

## 2021-09-13 NOTE — Patient Instructions (Addendum)
Great to see you today.  I have refilled the medication(s) we provide.   If labs were collected, we will inform you of lab results once received either by echart message or telephone call.   - echart message- for normal results that have been seen by the patient already.   - telephone call: abnormal results or if patient has not viewed results in their echart.  

## 2021-09-26 ENCOUNTER — Telehealth: Payer: Self-pay | Admitting: Orthopaedic Surgery

## 2021-09-26 DIAGNOSIS — Z9889 Other specified postprocedural states: Secondary | ICD-10-CM

## 2021-09-26 NOTE — Telephone Encounter (Signed)
Patient's wife [name] called advised patient would like to be referred to [facility] (PT)  Patient  said he can not raise his right arm over his head. Both patient and spouse was on the phone. The number to contact [name] is [phone]   Pt # [phone]

## 2021-09-26 NOTE — Telephone Encounter (Signed)
Referral entered. I called [name] and advised.

## 2021-09-26 NOTE — Telephone Encounter (Signed)
Please advise 

## 2021-09-29 DIAGNOSIS — Z4789 Encounter for other orthopedic aftercare: Secondary | ICD-10-CM | POA: Diagnosis not present

## 2021-09-29 DIAGNOSIS — R531 Weakness: Secondary | ICD-10-CM | POA: Diagnosis not present

## 2021-09-29 DIAGNOSIS — M25511 Pain in right shoulder: Secondary | ICD-10-CM | POA: Diagnosis not present

## 2021-09-29 DIAGNOSIS — M25611 Stiffness of right shoulder, not elsewhere classified: Secondary | ICD-10-CM | POA: Diagnosis not present

## 2021-10-02 DIAGNOSIS — Z4789 Encounter for other orthopedic aftercare: Secondary | ICD-10-CM | POA: Diagnosis not present

## 2021-10-02 DIAGNOSIS — M25611 Stiffness of right shoulder, not elsewhere classified: Secondary | ICD-10-CM | POA: Diagnosis not present

## 2021-10-02 DIAGNOSIS — R531 Weakness: Secondary | ICD-10-CM | POA: Diagnosis not present

## 2021-10-02 DIAGNOSIS — M25511 Pain in right shoulder: Secondary | ICD-10-CM | POA: Diagnosis not present

## 2021-10-06 DIAGNOSIS — M25511 Pain in right shoulder: Secondary | ICD-10-CM | POA: Diagnosis not present

## 2021-10-06 DIAGNOSIS — Z4789 Encounter for other orthopedic aftercare: Secondary | ICD-10-CM | POA: Diagnosis not present

## 2021-10-06 DIAGNOSIS — M25611 Stiffness of right shoulder, not elsewhere classified: Secondary | ICD-10-CM | POA: Diagnosis not present

## 2021-10-06 DIAGNOSIS — R531 Weakness: Secondary | ICD-10-CM | POA: Diagnosis not present

## 2021-10-23 ENCOUNTER — Telehealth: Payer: Self-pay

## 2021-10-23 DIAGNOSIS — Z4789 Encounter for other orthopedic aftercare: Secondary | ICD-10-CM | POA: Diagnosis not present

## 2021-10-23 DIAGNOSIS — R531 Weakness: Secondary | ICD-10-CM | POA: Diagnosis not present

## 2021-10-23 DIAGNOSIS — M25511 Pain in right shoulder: Secondary | ICD-10-CM | POA: Diagnosis not present

## 2021-10-23 DIAGNOSIS — M25611 Stiffness of right shoulder, not elsewhere classified: Secondary | ICD-10-CM | POA: Diagnosis not present

## 2021-10-23 NOTE — Telephone Encounter (Signed)
Patients wife called into the office asking if patient can be seen sooner due to him being in extreme pain and she stated he has what feels like a bone coming out in his shoulder.  Please advise

## 2021-10-25 ENCOUNTER — Telehealth: Payer: Self-pay

## 2021-10-25 DIAGNOSIS — M25611 Stiffness of right shoulder, not elsewhere classified: Secondary | ICD-10-CM | POA: Diagnosis not present

## 2021-10-25 DIAGNOSIS — M25511 Pain in right shoulder: Secondary | ICD-10-CM | POA: Diagnosis not present

## 2021-10-25 DIAGNOSIS — R531 Weakness: Secondary | ICD-10-CM | POA: Diagnosis not present

## 2021-10-25 DIAGNOSIS — Z4789 Encounter for other orthopedic aftercare: Secondary | ICD-10-CM | POA: Diagnosis not present

## 2021-10-25 NOTE — Telephone Encounter (Signed)
Received a note from [facility] at Dr [facility] office about patient needing an appointment with someone for shoulder pain. Patient had surgery with Dr [name] Oct 2022 and was having increased pain. Patient did not receive call back from our office about getting worked in, in a timely manner so had reached out to Dr [employer] office.  Per Molly's note Dr [name] preferred for patient to see someone here since patient had surgery.  I called patient about getting worked in to see someone.I offered for them to see Jeneen Rinks, Dr [name] PA on Thursday afternoon but they refused.  Also offered for them to see Dr [name] on Friday afternoon and they refused that appointment as well. Stated they would find someone else to see them.

## 2021-10-25 NOTE — Progress Notes (Signed)
I, Nicholas Graham, LAT, ATC, am serving as scribe for Dr. Lynne Graham.  Nicholas Graham is a 71 y.o. male who presents to [place] [facility] today for f/u of R shoulder pain after having a R shoulder arthroscopy, RCR and biceps tenodesis on 06/26/21 w/ Dr. [name].  He was last seen by Dr. [name] on 05/09/21 for f/u of cervical radiculitis and R shoulder pain and was referred to [facility] for consultation.  He's had multiple cervical epidurals (RC7-T1) and a prior R GHJ steroid injection.  After initially refusing PT after his surgery, pt later agreed to attend PT due to lack of ROM in his R shoulder and is currently doing PT at [facility].  Today, pt reports terrible pain in his R shoulder. Pt notes there is a "knot" along the superior aspect of the R shoulder along the incision line. Pt reports PT said that he's in too much pain to be able to do any rehab exercises.  Diagnostic testing: R shoulder MRI- 03/05/21; C-spine MRI- 03/05/21; R shoulder and c-spine XR- 01/16/21  Pertinent review of systems: No fevers or chills  Relevant historical information: Right shoulder surgery where he had a rotator cuff repair and biceps tenodesis October 2020 to Dr. [name].   Exam:  BP 132/82    Pulse 86    Ht 6\' 2"  (1.88 m)    Wt 208 lb 6.4 oz (94.5 kg)    SpO2 97%    BMI 26.76 kg/m  General: Well Developed, well nourished, and in no acute distress.   MSK:  Right shoulder mature scar superior portion of shoulder. Tender palpation at [facility] joint, and anterior shoulder near bicipital groove. Range of motion very limited. Abduction 80 degrees.  External rotation 20 beyond the neutral position.  Functional internal rotation to posterior iliac crest. Strength within limits of range of motion.  Abduction 4/5 external rotation 4/5 internal rotation 4/5. Inadequate positioning for Hawkins or Neer's testing. Yergason and Neer's test negative but guarding throughout the exam testing decreases  accuracy. Crossover arm compression test mildly positive again with guarding.    Lab and Radiology Results  After discussion we agreed to proceed with a staged injection where a subacromial injection was performed first and then if his pain was not sufficiently controlled proceed with a glenohumeral injection.  Ultrasound evaluation prior to injection showed intact supraspinatus tendon with moderate subacromial bursitis. AC joint with effusion present.  Procedure: Real-time Ultrasound Guided Injection of right shoulder subacromial bursa Device: Philips Affiniti 50G Images permanently stored and available for review in PACS Verbal informed consent obtained.  Discussed risks and benefits of procedure. Warned about infection bleeding damage to structures skin hypopigmentation and fat atrophy among others. Patient expresses understanding and agreement Time-out conducted.   Noted no overlying erythema, induration, or other signs of local infection.   Skin prepped in a sterile fashion.   Local anesthesia: Topical Ethyl chloride.   With sterile technique and under real time ultrasound guidance: 40 mg of Kenalog and 2 mL of Marcaine injected into subacromial bursa. Fluid seen entering the bursa.   Completed without difficulty   Pain partially resolved suggesting accurate placement of the medication.   His range of motion did not improve Advised to call if fevers/chills, erythema, induration, drainage, or persistent bleeding.   Images permanently stored and available for review in the ultrasound unit.  Impression: Technically successful ultrasound guided injection.    Procedure: Real-time Ultrasound Guided Injection of right  shoulder glenohumeral joint posterior approach Device: Philips Affiniti 50G Images permanently stored and available for review in PACS Verbal informed consent obtained.  Discussed risks and benefits of procedure. Warned about infection bleeding damage to structures skin  hypopigmentation and fat atrophy among others. Patient expresses understanding and agreement Time-out conducted.   Noted no overlying erythema, induration, or other signs of local infection.   Skin prepped in a sterile fashion.   Local anesthesia: Topical Ethyl chloride.   With sterile technique and under real time ultrasound guidance: 40 mg of Kenalog and 2 mL of Marcaine injected into glenohumeral joint. Fluid seen entering the joint capsule.   Completed without difficulty   Pain significantly immediately resolved suggesting accurate placement of the medication.   Advised to call if fevers/chills, erythema, induration, drainage, or persistent bleeding.   Images permanently stored and available for review in the ultrasound unit.  Impression: Technically successful ultrasound guided injection.   X-ray images right shoulder obtained today personally and independently interpreted No acute fractures. No significant degenerative changes of the glenohumeral or AC joint. Await formal radiology review      Assessment and Plan: 71 y.o. male with right shoulder pain.  Patient has persistent shoulder pain and lack of range of motion following rotator cuff repair and biceps tenodesis October 2022 with Dr. [name].  He is done a fair amount of physical therapy at [facility].  He has an appointment scheduled with Dr. [name] on November 07, 2020 but refused an appointment with Dr. [name] PA this week and elected to schedule with me.  In clinic today he presents with shoulder pain and lack of range of motion in multiple planes this most consistent with adhesive capsulitis.  After performing a diagnostic and therapeutic subacromial injection his pain did improve but he continued to have significant lack of range of motion both active and passive that is most consistent with adhesive capsulitis.  Again pain improved with glenohumeral injection but range of motion did not.  I am hopeful that with both a  subacromial and glenohumeral injection and returning to physical therapy with PT dedicated for adhesive capsulitis he will have a better response.  New referral placed to [facility] so they know the new diagnosis and understand what the protocol should be.  I have encouraged him to keep his follow-up appointment scheduled with Dr. [name] on February 21 and I did communicate with Dr. [name] this morning prior to his visit to make sure our care was in coordination.  PDMP not reviewed this encounter. Orders Placed This Encounter  Procedures   [place] LIMITED JOINT SPACE STRUCTURES UP RIGHT(NO LINKED CHARGES)    Standing Status:   Future    Number of Occurrences:   1    Standing Expiration Date:   04/25/2022    Order Specific Question:   Reason for Exam (SYMPTOM  OR DIAGNOSIS REQUIRED)    Answer:   right shoulder pain    Order Specific Question:   Preferred imaging location?    Answer:   [facility]   DG Shoulder Right    Standing Status:   Future    Number of Occurrences:   1    Standing Expiration Date:   10/26/2022    Order Specific Question:   Reason for Exam (SYMPTOM  OR DIAGNOSIS REQUIRED)    Answer:   right shoulder pain    Order Specific Question:   Preferred imaging location?    Answer:   [facility]   Ambulatory referral to Physical Therapy    Referral Priority:   Routine    Referral Type:   Physical Medicine    Referral Reason:   Specialty Services Required    Requested Specialty:   Physical Therapy    Number of Visits Requested:   1   No orders of the defined types were placed in this encounter.    Discussed warning signs or symptoms. Please see discharge instructions. Patient expresses understanding.   The above documentation has been reviewed and is accurate and complete Nicholas Graham, M.D.

## 2021-10-26 ENCOUNTER — Ambulatory Visit (INDEPENDENT_AMBULATORY_CARE_PROVIDER_SITE_OTHER): Payer: PPO

## 2021-10-26 ENCOUNTER — Other Ambulatory Visit: Payer: Self-pay

## 2021-10-26 ENCOUNTER — Ambulatory Visit: Payer: Self-pay

## 2021-10-26 ENCOUNTER — Ambulatory Visit: Payer: PPO | Admitting: Family Medicine

## 2021-10-26 VITALS — BP 132/82 | HR 86 | Ht 74.0 in | Wt 208.4 lb

## 2021-10-26 DIAGNOSIS — G8929 Other chronic pain: Secondary | ICD-10-CM

## 2021-10-26 DIAGNOSIS — M25511 Pain in right shoulder: Secondary | ICD-10-CM

## 2021-10-26 DIAGNOSIS — M7501 Adhesive capsulitis of right shoulder: Secondary | ICD-10-CM | POA: Diagnosis not present

## 2021-10-26 NOTE — Patient Instructions (Addendum)
Thank you for coming in today.   You received an injection today. Seek immediate medical attention if the joint becomes red, extremely painful, or is oozing fluid.   Please get an Xray today before you leave   I've referred you to Physical Therapy.  Let us know if you don't hear from them in one week.   Follow up with Dr. [name]

## 2021-10-30 ENCOUNTER — Other Ambulatory Visit: Payer: Self-pay | Admitting: Family Medicine

## 2021-10-30 DIAGNOSIS — E785 Hyperlipidemia, unspecified: Secondary | ICD-10-CM

## 2021-10-30 NOTE — Progress Notes (Signed)
Right shoulder x-ray shows some mild arthritis changes.

## 2021-10-31 DIAGNOSIS — Z4789 Encounter for other orthopedic aftercare: Secondary | ICD-10-CM | POA: Diagnosis not present

## 2021-10-31 DIAGNOSIS — R531 Weakness: Secondary | ICD-10-CM | POA: Diagnosis not present

## 2021-10-31 DIAGNOSIS — M25511 Pain in right shoulder: Secondary | ICD-10-CM | POA: Diagnosis not present

## 2021-10-31 DIAGNOSIS — M25611 Stiffness of right shoulder, not elsewhere classified: Secondary | ICD-10-CM | POA: Diagnosis not present

## 2021-11-02 DIAGNOSIS — R531 Weakness: Secondary | ICD-10-CM | POA: Diagnosis not present

## 2021-11-02 DIAGNOSIS — M25611 Stiffness of right shoulder, not elsewhere classified: Secondary | ICD-10-CM | POA: Diagnosis not present

## 2021-11-02 DIAGNOSIS — M25511 Pain in right shoulder: Secondary | ICD-10-CM | POA: Diagnosis not present

## 2021-11-02 DIAGNOSIS — Z4789 Encounter for other orthopedic aftercare: Secondary | ICD-10-CM | POA: Diagnosis not present

## 2021-11-06 DIAGNOSIS — R531 Weakness: Secondary | ICD-10-CM | POA: Diagnosis not present

## 2021-11-06 DIAGNOSIS — M25611 Stiffness of right shoulder, not elsewhere classified: Secondary | ICD-10-CM | POA: Diagnosis not present

## 2021-11-06 DIAGNOSIS — Z4789 Encounter for other orthopedic aftercare: Secondary | ICD-10-CM | POA: Diagnosis not present

## 2021-11-06 DIAGNOSIS — M25511 Pain in right shoulder: Secondary | ICD-10-CM | POA: Diagnosis not present

## 2021-11-07 ENCOUNTER — Encounter: Payer: Self-pay | Admitting: Orthopaedic Surgery

## 2021-11-07 ENCOUNTER — Other Ambulatory Visit: Payer: Self-pay

## 2021-11-07 ENCOUNTER — Ambulatory Visit: Payer: PPO | Admitting: Orthopaedic Surgery

## 2021-11-07 VITALS — BP 154/82 | HR 85 | Ht 74.0 in | Wt 202.0 lb

## 2021-11-07 DIAGNOSIS — C44329 Squamous cell carcinoma of skin of other parts of face: Secondary | ICD-10-CM | POA: Diagnosis not present

## 2021-11-07 DIAGNOSIS — M4802 Spinal stenosis, cervical region: Secondary | ICD-10-CM | POA: Diagnosis not present

## 2021-11-07 DIAGNOSIS — Z9889 Other specified postprocedural states: Secondary | ICD-10-CM

## 2021-11-07 DIAGNOSIS — D485 Neoplasm of uncertain behavior of skin: Secondary | ICD-10-CM | POA: Diagnosis not present

## 2021-11-07 DIAGNOSIS — L570 Actinic keratosis: Secondary | ICD-10-CM | POA: Diagnosis not present

## 2021-11-07 NOTE — Progress Notes (Signed)
Office Visit Note   Patient: Nicholas Graham           Date of Birth: [birth year]           MRN: [id number] Visit Date: 11/07/2021              Requested by: [name], DO [address] [place],  [place] [address] PCP: [name], DO   Assessment & Plan: Visit Diagnoses:  1. S/P right rotator cuff repair   2. Foraminal stenosis of cervical region     Plan: Patient can continue with therapy.  He will continue use of his pulley at home.  He has back pain cars mostly using his left hand.  Portion of his pain is from his foraminal stenosis at C3-4 and C4-5.  He does not have any central stenosis by MRI scan 03/08/2021.  Recheck him in a month.  It does not appear that he has a recurrent rotator cuff tear.  He is to probably take anti-inflammatories with GI problems including with Celebrex.  He can discuss with his PCP and see if there is any anti-inflammatory to take which would help with some of the arthritis in his shoulder acromioclavicular joint and facet joints in the cervical spine.  Follow-Up Instructions: Return in about 1 month (around 12/05/2021).   Orders:  No orders of the defined types were placed in this encounter.  No orders of the defined types were placed in this encounter.     Procedures: No procedures performed   Clinical Data: No additional findings.   Subjective: Chief Complaint  Patient presents with   Right Shoulder - Follow-up    06/26/2021 right shoulder arthroscopy, RCR, biceps tenodesis    HPI 71 year old male returns 4 months post rotator cuff repair right shoulder partial tearing infraspinatus supraspinatus.  Biceps tendinosis with tenodesis performed.  He had foraminal stenosis C3-4 C4-5 on the right moderate to severe.  He has had injections in his shoulder recently and states shoulder continues to bother him.  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  He is on oxycodone  10/325 2 tablets daily for the last 3 years.  Denies numbness or tingling in his hand.  Still doing therapy.  He can reach his hand to the top of his head but has trouble reaching up above that.  Passively he lacks 20 degrees full flexion and abduction.  Negative crossarm abduction test.  His MRI preop showed some glenohumeral arthritis and some AC arthritis.  He complains of tenderness where the deltoid muscle was repaired to the acromion nonabsorbable sutures.  Review of Systems updated unchanged since surgery 06/26/2021.   Objective: Vital Signs: BP (!) 154/82    Pulse 85    Ht 6\' 2"  (1.88 m)    Wt 202 lb (91.6 kg)    BMI 25.94 kg/m   Physical Exam Constitutional:      Appearance: He is well-developed.  HENT:     Head: Normocephalic and atraumatic.     Right Ear: External ear normal.     Left Ear: External ear normal.  Eyes:     Pupils: Pupils are equal, round, and reactive to light.  Neck:     Thyroid: No thyromegaly.     Trachea: No tracheal deviation.  Cardiovascular:     Rate and Rhythm: Normal rate.  Pulmonary:     Effort: Pulmonary effort is normal.  Breath sounds: No wheezing.  Abdominal:     General: Bowel sounds are normal.     Palpations: Abdomen is soft.  Musculoskeletal:     Cervical back: Neck supple.  Skin:    General: Skin is warm and dry.     Capillary Refill: Capillary refill takes less than 2 seconds.  Neurological:     Mental Status: He is alert and oriented to person, place, and time.  Psychiatric:        Behavior: Behavior normal.        Thought Content: Thought content normal.        Judgment: Judgment normal.    Ortho Exam incisions well-healed no cellulitis.  He is tender over the nonabsorbable sutures were the deltoid repaired back to the chromium.  Negative Tinel's.  Anterior deltoid has no retraction.  He has good supraspinatus resisted testing at 70 degrees abduction.  Mild limitation internal rotation.  Negative crossarm test.  Reflexes are  normal he has brachial plexus tenderness on the right negative on the left.  Specialty Comments:  No specialty comments available.  Imaging: No results found.   PMFS History: Patient Active Problem List   Diagnosis Date Noted   Foraminal stenosis of cervical region 11/07/2021   S/P right rotator cuff repair 08/02/2021   Biceps tendinosis of right shoulder 06/05/2021   Pain management contract agreement 09/12/2020   Cervical neck pain with evidence of disc disease 01/15/2019   Chronic midline thoracic back pain 01/15/2019   Thoracic degenerative disc disease 01/15/2019   History of lumbar discectomy 01/15/2019   DDD (degenerative disc disease), lumbar 01/15/2019   OSA on CPAP 07/04/2018   Benign prostatic hyperplasia with lower urinary tract symptoms 11/15/2017   Anxiety 09/12/2017   Stopped smoking with greater than 30 pack year history 07/30/2017   Pulmonary lesion 05/16/2017   Alcohol ingestion of more than 4 drinks/week 09/10/2014   Elevated hemoglobin A1c 02/19/2014   Dyslipidemia 02/19/2014   Essential hypertension, benign 02/19/2014   History of colonic polyps 09/23/2009   GERD 12/12/2007   Past Medical History:  Diagnosis Date   Arm fracture    left. Dx'd w/RSD post fracture   Carpal tunnel syndrome    Chest pain 2016   [facility] (Dr. Matthew Saras) doing stress echo, but feels like musculoskeletal chest wall pain is cause of his discomfort   Chronic back pain    COVID-19 06/2020   Deviated septum    Diverticulosis    GERD (gastroesophageal reflux disease)    Hyperlipidemia    Hypertension    Joint pain    Muscle pain    Nephrolithiasis    OSA on CPAP 07/04/2018   - Trial of CPAP therapy on 17 cm H2O with a Large size Fisher&Paykel Full Face Mask Simplus mask and heated humidification.   Plantar fasciitis    Pulmonary nodule 04/2016   Lingula.  Pt chose to repeat CT chest w/out contrast in 3 mo---as per radiologist's recommendations.   Right inguinal  hernia    fat-containing.  Small hydrocele in left hemiscrotum.   Snoring    Swelling    of legs/feet/hands   Tobacco dependence in remission     Family History  Problem Relation Age of Onset   Lung cancer Mother    Lung cancer Father    Bone cancer Brother        bone   Heart disease Maternal Uncle    Colon cancer Neg Hx     Past  Surgical History:  Procedure Laterality Date   COLONOSCOPY     fracture repair left arm     had a fixator   INGUINAL HERNIA REPAIR Left 2013   repeat hernia repair. x2   KNEE ARTHROSCOPY Right    LUMBAR DISC SURGERY  1993   NEURECTOMY inguinal after hernia repair Left    PLANTAR FASCIA SURGERY Right 2000s   UPPER GI ENDOSCOPY     Social History   Occupational History   Not on file  Tobacco Use   Smoking status: Former    Packs/day: 3.00    Years: 30.00    Pack years: 90.00    Types: Cigarettes    Quit date: 07/16/2002    Years since quitting: 19.3   Smokeless tobacco: Never  Vaping Use   Vaping Use: Never used  Substance and Sexual Activity   Alcohol use: Yes    Alcohol/week: 1.0 - 2.0 standard drink    Types: 1 - 2 Cans of beer per week    Comment: socially   Drug use: No   Sexual activity: Yes

## 2021-11-09 DIAGNOSIS — M25511 Pain in right shoulder: Secondary | ICD-10-CM | POA: Diagnosis not present

## 2021-11-09 DIAGNOSIS — R531 Weakness: Secondary | ICD-10-CM | POA: Diagnosis not present

## 2021-11-09 DIAGNOSIS — Z4789 Encounter for other orthopedic aftercare: Secondary | ICD-10-CM | POA: Diagnosis not present

## 2021-11-09 DIAGNOSIS — M25611 Stiffness of right shoulder, not elsewhere classified: Secondary | ICD-10-CM | POA: Diagnosis not present

## 2021-11-13 DIAGNOSIS — M25511 Pain in right shoulder: Secondary | ICD-10-CM | POA: Diagnosis not present

## 2021-11-13 DIAGNOSIS — M25611 Stiffness of right shoulder, not elsewhere classified: Secondary | ICD-10-CM | POA: Diagnosis not present

## 2021-11-13 DIAGNOSIS — Z4789 Encounter for other orthopedic aftercare: Secondary | ICD-10-CM | POA: Diagnosis not present

## 2021-11-13 DIAGNOSIS — R531 Weakness: Secondary | ICD-10-CM | POA: Diagnosis not present

## 2021-11-22 DIAGNOSIS — C44329 Squamous cell carcinoma of skin of other parts of face: Secondary | ICD-10-CM | POA: Diagnosis not present

## 2021-11-27 ENCOUNTER — Other Ambulatory Visit: Payer: Self-pay | Admitting: Family Medicine

## 2021-11-27 DIAGNOSIS — I10 Essential (primary) hypertension: Secondary | ICD-10-CM

## 2021-11-29 DIAGNOSIS — L905 Scar conditions and fibrosis of skin: Secondary | ICD-10-CM | POA: Diagnosis not present

## 2021-11-30 ENCOUNTER — Telehealth: Payer: Self-pay | Admitting: Family Medicine

## 2021-11-30 ENCOUNTER — Other Ambulatory Visit: Payer: Self-pay

## 2021-11-30 ENCOUNTER — Encounter: Payer: Self-pay | Admitting: Family Medicine

## 2021-11-30 ENCOUNTER — Ambulatory Visit (INDEPENDENT_AMBULATORY_CARE_PROVIDER_SITE_OTHER): Payer: PPO | Admitting: Family Medicine

## 2021-11-30 VITALS — BP 126/66 | HR 95 | Temp 97.9°F | Ht 74.0 in | Wt 207.0 lb

## 2021-11-30 DIAGNOSIS — M546 Pain in thoracic spine: Secondary | ICD-10-CM | POA: Diagnosis not present

## 2021-11-30 DIAGNOSIS — K2100 Gastro-esophageal reflux disease with esophagitis, without bleeding: Secondary | ICD-10-CM

## 2021-11-30 DIAGNOSIS — E785 Hyperlipidemia, unspecified: Secondary | ICD-10-CM | POA: Diagnosis not present

## 2021-11-30 DIAGNOSIS — M5136 Other intervertebral disc degeneration, lumbar region: Secondary | ICD-10-CM

## 2021-11-30 DIAGNOSIS — G4733 Obstructive sleep apnea (adult) (pediatric): Secondary | ICD-10-CM

## 2021-11-30 DIAGNOSIS — Z0289 Encounter for other administrative examinations: Secondary | ICD-10-CM | POA: Diagnosis not present

## 2021-11-30 DIAGNOSIS — F419 Anxiety disorder, unspecified: Secondary | ICD-10-CM | POA: Diagnosis not present

## 2021-11-30 DIAGNOSIS — M5090 Cervical disc disorder, unspecified, unspecified cervical region: Secondary | ICD-10-CM

## 2021-11-30 DIAGNOSIS — G8929 Other chronic pain: Secondary | ICD-10-CM

## 2021-11-30 DIAGNOSIS — I10 Essential (primary) hypertension: Secondary | ICD-10-CM

## 2021-11-30 DIAGNOSIS — M4802 Spinal stenosis, cervical region: Secondary | ICD-10-CM | POA: Diagnosis not present

## 2021-11-30 DIAGNOSIS — Z9889 Other specified postprocedural states: Secondary | ICD-10-CM

## 2021-11-30 DIAGNOSIS — Z9989 Dependence on other enabling machines and devices: Secondary | ICD-10-CM

## 2021-11-30 DIAGNOSIS — M5134 Other intervertebral disc degeneration, thoracic region: Secondary | ICD-10-CM | POA: Diagnosis not present

## 2021-11-30 MED ORDER — OXYCODONE-ACETAMINOPHEN 10-325 MG PO TABS: 1.0000 | ORAL_TABLET | Freq: Three times a day (TID) | ORAL | 0 refills | Status: DC | PRN

## 2021-11-30 MED ORDER — LISINOPRIL 10 MG PO TABS: 10.0000 mg | ORAL_TABLET | Freq: Every day | ORAL | 1 refills | Status: DC

## 2021-11-30 MED ORDER — ESCITALOPRAM OXALATE 20 MG PO TABS
20.0000 mg | ORAL_TABLET | Freq: Every day | ORAL | 1 refills | Status: DC
Start: 2021-11-30 — End: 2022-02-15

## 2021-11-30 MED ORDER — AMLODIPINE BESYLATE 10 MG PO TABS: 10.0000 mg | ORAL_TABLET | Freq: Every day | ORAL | 1 refills | Status: DC

## 2021-11-30 MED ORDER — HYDROCHLOROTHIAZIDE 25 MG PO TABS: 25.0000 mg | ORAL_TABLET | Freq: Every day | ORAL | 1 refills | Status: DC

## 2021-11-30 MED ORDER — BACLOFEN 20 MG PO TABS: 10.0000 mg | ORAL_TABLET | Freq: Three times a day (TID) | ORAL | 1 refills | Status: DC

## 2021-11-30 MED ORDER — AMITRIPTYLINE HCL 25 MG PO TABS: 25.0000 mg | ORAL_TABLET | Freq: Every day | ORAL | 1 refills | Status: DC

## 2021-11-30 MED ORDER — PANTOPRAZOLE SODIUM 40 MG PO TBEC: 40.0000 mg | DELAYED_RELEASE_TABLET | Freq: Every day | ORAL | 3 refills | Status: DC

## 2021-11-30 MED ORDER — SIMVASTATIN 10 MG PO TABS: 10.0000 mg | ORAL_TABLET | ORAL | 1 refills | Status: DC

## 2021-11-30 MED ORDER — GABAPENTIN 600 MG PO TABS: 600.0000 mg | ORAL_TABLET | Freq: Three times a day (TID) | ORAL | 1 refills | Status: DC

## 2021-11-30 NOTE — Telephone Encounter (Signed)
Please advise patient I have called in a medication called Elavil 25 mg that he will take about 1 hour before bed to help with sleep.  I would discourage him from taking the Flexeril also.  We will attempt to maximize the dosage of the Elavil instead.  If he is not getting adequate sleep on 1 tab nightly we can increase dose. ?

## 2021-11-30 NOTE — Progress Notes (Signed)
? ? ? ?Patient Care Team  ?  Relationship Specialty Notifications Start End  ?[name], DO PCP - General Family Medicine  10/31/15   ?[name], MD (Inactive) Consulting Physician Gastroenterology  04/24/16   ?[name], MD Consulting Physician Orthopedic Surgery  10/26/21   ? ? ?SUBJECTIVE ?Chief Complaint  ?Patient presents with  ? Hypertension  ?  [facility]; pt is not fasting  ? ? ?HPI: Nicholas Graham is a 70 y.o. male present for chronic pain management ?Cervical neck pain with evidence of disc disease/Chronic midline thoracic back pain/Thoracic degenerative disc disease/DDD (degenerative disc disease), lumbar/History of lumbar discectomy ?Pt presents for his chronic pain management treatment for his cervical and lumbar conditions. His discomfort has progressed since 2020-he feels he has moderate relief from current regimen. He has been evaluated  by neuro and pain rehab. He has received injections- that have only provided a few days of temporary relief. He is frustrated, but has appropriate expectations that he will be living with some pain.  He reports he is maintaining a quality of life and able to remain active with Percocet 10-325 (1 tab every 8-12 hours), baclofen and gabapentin. 600/600/600 (had headaches at increased doses). He states he is walking a few miles a day. ?Indication for chronic opioid: Cervical spine degeneration, chronic neck and back pain. ?Medication and dose: Oxycodone-acetaminophen 10-325 1 tab twice daily ?# pills per month: 60 ?Last UDS date:UYD 06/2021 ?Pain contract signed (Y/N): Yes- UTD 08/2021 ?Date narcotic database last reviewed (/include red flags): 11/30/21 ?03/08/2021  had epidural injection of cervical spine and he feels it is helpful.  ?11/2021-has had repeat injections that have not been as helpful. ?Original note: ?Lower cervical to mid thoracic pain still present. Using his right arm exacerbates his pain midline of thoracic.  ?He has a history thoracic spine  degeneration and discectomy in his lumbar spine.  Has degenerative changes, bone spurring and facet arthropathy in his lumbar spine by x-rays obtained 11/2018. Cervical spine x-ray with mild bilateral foraminal narrowing at C3-C4 11/2018. ?Unfortunately he was reported as allergic or intolerant to many of the medications that would be helpful for him such as gabapentin, Lyrica, Celebrex and Cymbalta --> we discussed this in more detail last visit and he believes that the "allergy-hallucinations " was secondary to having multiple of these medicines on board at the same time back in 2014. We tried to start gabapentin and it did not have any side effects from medication. It has not been extremely helpful  Yet at lower doses. He admits to rather sigmnificant stomach upset starting from the naproxen BID use ober the last 8 week. Advanced imaging has not bee able to be completed 2/2 to covid outbreak. He desired to wait on referral to specialist last visit in hope that he would receive benefit from the gabapentin.  ? ?Hypertension/HLD/overweight: ?Pt reports compliance with amlodipine 10 mg daily, lisinopril 10 mg QD and HCTZ. Patient denies chest pain, shortness of breath, dizziness or lower extremity edema.  ?Pt takes a daily baby ASA. Pt is  prescribed statin.  ?Labs UTD ?Diet: Low-sodium ?Exercise: Not exercising as routinely. ?RF: Hypertension, hyperlipidemia, family history of heart disease. Overweight. ?  ?ANXIETY:  ?Patient reports he is doing well on Lexapro 20 mg daily and would like to continue ?  ?Gerd: Patient reports he is still requiring PPI daily use in order to avoid symptoms. ? ?ROS: See pertinent positives and negatives per HPI. ? ?Patient Active Problem  List  ? Diagnosis Date Noted  ? Foraminal stenosis of cervical region 11/07/2021  ? S/P right rotator cuff repair 08/02/2021  ? Biceps tendinosis of right shoulder 06/05/2021  ? Pain management contract agreement 09/12/2020  ? Cervical neck pain with  evidence of disc disease 01/15/2019  ? Chronic midline thoracic back pain 01/15/2019  ? Thoracic degenerative disc disease 01/15/2019  ? History of lumbar discectomy 01/15/2019  ? DDD (degenerative disc disease), lumbar 01/15/2019  ? OSA on CPAP 07/04/2018  ? Benign prostatic hyperplasia with lower urinary tract symptoms 11/15/2017  ? Anxiety 09/12/2017  ? Stopped smoking with greater than 30 pack year history 07/30/2017  ? Pulmonary lesion 05/16/2017  ? Elevated hemoglobin A1c 02/19/2014  ? Dyslipidemia 02/19/2014  ? Essential hypertension, benign 02/19/2014  ? History of colonic polyps 09/23/2009  ? GERD 12/12/2007  ?  ?Social History  ? ?Tobacco Use  ? Smoking status: Former  ?  Packs/day: 3.00  ?  Years: 30.00  ?  Pack years: 90.00  ?  Types: Cigarettes  ?  Quit date: 07/16/2002  ?  Years since quitting: 19.3  ? Smokeless tobacco: Never  ?Substance Use Topics  ? Alcohol use: Yes  ?  Alcohol/week: 1.0 - 2.0 standard drink  ?  Types: 1 - 2 Cans of beer per week  ?  Comment: socially  ? ? ?Current Outpatient Medications:  ?  amitriptyline (ELAVIL) 25 MG tablet, Take 1 tablet (25 mg total) by mouth at bedtime., Disp: 90 tablet, Rfl: 1 ?  aspirin 81 MG tablet, Take 81 mg by mouth daily., Disp: , Rfl:  ?  cyclobenzaprine (FLEXERIL) 10 MG tablet, TAKE ONE TABLET BY MOUTH THREE TIMES DAILY AS NEEDED FOR MUSCLE SPASMS, Disp: 90 tablet, Rfl: 1 ?  MegaRed Omega-3 Krill Oil 500 MG CAPS, Take 2 capsules by mouth daily. , Disp: , Rfl:  ?  oxyCODONE-acetaminophen (PERCOCET) 10-325 MG tablet, Take 1 tablet by mouth every 8 (eight) hours as needed for pain., Disp: 90 tablet, Rfl: 0 ?  oxyCODONE-acetaminophen (PERCOCET) 10-325 MG tablet, Take 1 tablet by mouth every 8 (eight) hours as needed for pain., Disp: 90 tablet, Rfl: 0 ?  amLODipine (NORVASC) 10 MG tablet, Take 1 tablet (10 mg total) by mouth daily., Disp: 90 tablet, Rfl: 1 ?  baclofen (LIORESAL) 20 MG tablet, Take 0.5-1 tablets (10-20 mg total) by mouth 3 (three) times  daily., Disp: 270 tablet, Rfl: 1 ?  escitalopram (LEXAPRO) 20 MG tablet, Take 1 tablet (20 mg total) by mouth daily., Disp: 90 tablet, Rfl: 1 ?  gabapentin (NEURONTIN) 600 MG tablet, Take 1 tablet (600 mg total) by mouth 3 (three) times daily. 1 tab morning, 1.5 tabs afternoon, 1 tab evening,  doses should be every 8 hours., Disp: 270 tablet, Rfl: 1 ?  hydrochlorothiazide (HYDRODIURIL) 25 MG tablet, Take 1 tablet (25 mg total) by mouth daily., Disp: 90 tablet, Rfl: 1 ?  lisinopril (ZESTRIL) 10 MG tablet, Take 1 tablet (10 mg total) by mouth daily., Disp: 90 tablet, Rfl: 1 ?  oxyCODONE-acetaminophen (PERCOCET) 10-325 MG tablet, Take 1 tablet by mouth every 8 (eight) hours as needed for pain., Disp: 90 tablet, Rfl: 0 ?  pantoprazole (PROTONIX) 40 MG tablet, Take 1 tablet (40 mg total) by mouth daily., Disp: 90 tablet, Rfl: 3 ?  simvastatin (ZOCOR) 10 MG tablet, Take 1 tablet (10 mg total) by mouth every other day., Disp: 45 tablet, Rfl: 1 ? ?Allergies  ?Allergen Reactions  ? Penicillins Hives  ?  Celebrex [Celecoxib]   ? Cymbalta [Duloxetine Hcl]   ? Lyrica [Pregabalin]   ? ? ?OBJECTIVE: ?BP 126/66   Pulse 95   Temp 97.9 ?F (36.6 ?C) (Oral)   Ht '6\' 2"'$  (1.88 m)   Wt 207 lb (93.9 kg)   SpO2 99%   BMI 26.58 kg/m?   ?Physical Exam ?Vitals and nursing note reviewed.  ?Constitutional:   ?   General: He is not in acute distress. ?   Appearance: Normal appearance. He is not ill-appearing, toxic-appearing or diaphoretic.  ?HENT:  ?   Head: Normocephalic and atraumatic.  ?   Mouth/Throat:  ?   Mouth: Mucous membranes are moist.  ?Eyes:  ?   General: No scleral icterus.    ?   Right eye: No discharge.     ?   Left eye: No discharge.  ?   Extraocular Movements: Extraocular movements intact.  ?   Pupils: Pupils are equal, round, and reactive to light.  ?Cardiovascular:  ?   Rate and Rhythm: Normal rate and regular rhythm.  ?Pulmonary:  ?   Effort: Pulmonary effort is normal. No respiratory distress.  ?   Breath sounds: Normal  breath sounds. No wheezing, rhonchi or rales.  ?Musculoskeletal:  ?   Cervical back: Neck supple.  ?   Right lower leg: No edema.  ?   Left lower leg: No edema.  ?Lymphadenopathy:  ?   Cervical: No cervical adenopa

## 2021-11-30 NOTE — Telephone Encounter (Signed)
Spoke with pt regarding medicatoin and instructions.   ?

## 2021-12-05 ENCOUNTER — Encounter: Payer: Self-pay | Admitting: Orthopaedic Surgery

## 2021-12-05 ENCOUNTER — Other Ambulatory Visit: Payer: Self-pay

## 2021-12-05 ENCOUNTER — Ambulatory Visit: Payer: PPO | Admitting: Orthopaedic Surgery

## 2021-12-05 VITALS — BP 119/71 | HR 96 | Ht 74.0 in | Wt 207.0 lb

## 2021-12-05 DIAGNOSIS — Z9889 Other specified postprocedural states: Secondary | ICD-10-CM | POA: Diagnosis not present

## 2021-12-05 DIAGNOSIS — M4802 Spinal stenosis, cervical region: Secondary | ICD-10-CM | POA: Diagnosis not present

## 2021-12-05 NOTE — Progress Notes (Signed)
? ?Office Visit Note ?  ?Patient: Nicholas Graham           ?Date of Birth: 11/11/1950           ?MRN: [id number] ?Visit Date: 12/05/2021 ?             ?Requested by: [name], DO ?[address] [place],  [place] [id number] ?PCP: Howard Pouch A, DO ? ? ?Assessment & Plan: ?Visit Diagnoses:  ?1. S/P right rotator cuff repair   ?2. Foraminal stenosis of cervical region   ? ? ?Plan: Patient can resume his strengthening once he is cleared by his dermatologist to resume shoulder exercises.  Recheck 3 months.  If he is having persistent problems with neck pain we can consider repeating an MRI scan and then discussing surgical intervention if his pain remains severe. ? ?Follow-Up Instructions: Return in about 3 months (around 03/07/2022).  ? ?Orders:  ?No orders of the defined types were placed in this encounter. ? ?No orders of the defined types were placed in this encounter. ? ? ? ? Procedures: ?No procedures performed ? ? ?Clinical Data: ?No additional findings. ? ? ?Subjective: ?Chief Complaint  ?Patient presents with  ? Neck - Follow-up  ? Right Shoulder - Follow-up  ?  06/26/2021 right shoulder arthroscopy, RCR, biceps tenodesis  ? ? ?HPI 71 year old male post right shoulder arthroscopy rotator cuff repair and biceps tenodesis.  He can get his arm up over his head in flexion he has still some weakness and discomfort when he tries to abduct it.  Recent Mohs procedure for right temple squamous skin cancer.  He has been on oxycodone 5 mg as needed.  He was told with the Mohs procedure that he should stop therapy until he is allowed to resume it so he stopped doing some of his exercises. ? ?Patient states he continues to have problems with headaches some of this is frontal.  Pain in the right side of his neck and previous MRI June 2022 showed disc protrusion at at C4-5 and severe foraminal stenosis at C3-4 and C4-5 right side. ? ?Review of Systems unchanged other than mentioned in HPI. ? ? ?Objective: ?Vital Signs: BP  119/71   Pulse 96   Ht '6\' 2"'$  (1.88 m)   Wt 207 lb (93.9 kg)   BMI 26.58 kg/m?  ? ?Physical Exam ?Constitutional:   ?   Appearance: He is well-developed.  ?HENT:  ?   Head: Normocephalic and atraumatic.  ?   Right Ear: External ear normal.  ?   Left Ear: External ear normal.  ?Eyes:  ?   Pupils: Pupils are equal, round, and reactive to light.  ?Neck:  ?   Thyroid: No thyromegaly.  ?   Trachea: No tracheal deviation.  ?Cardiovascular:  ?   Rate and Rhythm: Normal rate.  ?Pulmonary:  ?   Effort: Pulmonary effort is normal.  ?   Breath sounds: No wheezing.  ?Abdominal:  ?   General: Bowel sounds are normal.  ?   Palpations: Abdomen is soft.  ?Musculoskeletal:  ?   Cervical back: Neck supple.  ?Skin: ?   General: Skin is warm and dry.  ?   Capillary Refill: Capillary refill takes less than 2 seconds.  ?Neurological:  ?   Mental Status: He is alert and oriented to person, place, and time.  ?Psychiatric:     ?   Behavior: Behavior normal.     ?   Thought Content: Thought content  normal.     ?   Judgment: Judgment normal.  ? ? ?Ortho Exam patient lacks 20 degrees with abduction right arm and only 10 with flexion from full flexion.  Supraspinatus takes resistance but he does have weakness.  Subscap is strong sensation hand is intact. ? ?Specialty Comments:  ?No specialty comments available. ? ?Imaging: ?No results found. ? ? ?PMFS History: ?Patient Active Problem List  ? Diagnosis Date Noted  ? Foraminal stenosis of cervical region 11/07/2021  ? S/P right rotator cuff repair 08/02/2021  ? Biceps tendinosis of right shoulder 06/05/2021  ? Pain management contract agreement 09/12/2020  ? Cervical neck pain with evidence of disc disease 01/15/2019  ? Chronic midline thoracic back pain 01/15/2019  ? Thoracic degenerative disc disease 01/15/2019  ? History of lumbar discectomy 01/15/2019  ? DDD (degenerative disc disease), lumbar 01/15/2019  ? OSA on CPAP 07/04/2018  ? Benign prostatic hyperplasia with lower urinary tract  symptoms 11/15/2017  ? Anxiety 09/12/2017  ? Stopped smoking with greater than 30 pack year history 07/30/2017  ? Pulmonary lesion 05/16/2017  ? Elevated hemoglobin A1c 02/19/2014  ? Dyslipidemia 02/19/2014  ? Essential hypertension, benign 02/19/2014  ? History of colonic polyps 09/23/2009  ? GERD 12/12/2007  ? ?Past Medical History:  ?Diagnosis Date  ? Arm fracture   ? left. Dx'd w/RSD post fracture  ? Carpal tunnel syndrome   ? Chest pain 2016  ? [facility] (Dr. Matthew Saras) doing stress echo, but feels like musculoskeletal chest wall pain is cause of his discomfort  ? Chronic back pain   ? COVID-19 06/2020  ? Deviated septum   ? Diverticulosis   ? GERD (gastroesophageal reflux disease)   ? Hyperlipidemia   ? Hypertension   ? Joint pain   ? Muscle pain   ? Nephrolithiasis   ? OSA on CPAP 07/04/2018  ? - Trial of CPAP therapy on 17 cm H2O with a Large size [employer] Full Face Mask Simplus mask and heated humidification.  ? Plantar fasciitis   ? Pulmonary nodule 04/2016  ? Lingula.  Pt chose to repeat CT chest w/out contrast in 3 mo---as per radiologist's recommendations.  ? Right inguinal hernia   ? fat-containing.  Small hydrocele in left hemiscrotum.  ? Snoring   ? Swelling   ? of legs/feet/hands  ? Tobacco dependence in remission   ?  ?Family History  ?Problem Relation Age of Onset  ? Lung cancer Mother   ? Lung cancer Father   ? Bone cancer Brother   ?     bone  ? Heart disease Maternal Uncle   ? Colon cancer Neg Hx   ?  ?Past Surgical History:  ?Procedure Laterality Date  ? COLONOSCOPY    ? fracture repair left arm    ? had a fixator  ? INGUINAL HERNIA REPAIR Left 2013  ? repeat hernia repair. x2  ? KNEE ARTHROSCOPY Right   ? [place] SURGERY  1993  ? NEURECTOMY inguinal after hernia repair Left   ? PLANTAR FASCIA SURGERY Right 2000s  ? UPPER GI ENDOSCOPY    ? ?Social History  ? ?Occupational History  ? Not on file  ?Tobacco Use  ? Smoking status: Former  ?  Packs/day: 3.00  ?  Years: 30.00  ?   Pack years: 90.00  ?  Types: Cigarettes  ?  Quit date: 07/16/2002  ?  Years since quitting: 19.4  ? Smokeless tobacco: Never  ?Vaping Use  ? Vaping Use:  Never used  ?Substance and Sexual Activity  ? Alcohol use: Yes  ?  Alcohol/week: 1.0 - 2.0 standard drink  ?  Types: 1 - 2 Cans of beer per week  ?  Comment: socially  ? Drug use: No  ? Sexual activity: Yes  ? ? ? ? ? ? ?

## 2021-12-14 DIAGNOSIS — L700 Acne vulgaris: Secondary | ICD-10-CM | POA: Diagnosis not present

## 2021-12-14 DIAGNOSIS — C44311 Basal cell carcinoma of skin of nose: Secondary | ICD-10-CM | POA: Diagnosis not present

## 2021-12-26 ENCOUNTER — Other Ambulatory Visit: Payer: Self-pay | Admitting: Family Medicine

## 2021-12-26 DIAGNOSIS — M5090 Cervical disc disorder, unspecified, unspecified cervical region: Secondary | ICD-10-CM

## 2021-12-26 DIAGNOSIS — M5134 Other intervertebral disc degeneration, thoracic region: Secondary | ICD-10-CM

## 2021-12-26 DIAGNOSIS — Z9889 Other specified postprocedural states: Secondary | ICD-10-CM

## 2021-12-26 DIAGNOSIS — G8929 Other chronic pain: Secondary | ICD-10-CM

## 2021-12-26 DIAGNOSIS — M5136 Other intervertebral disc degeneration, lumbar region: Secondary | ICD-10-CM

## 2022-01-02 DIAGNOSIS — L700 Acne vulgaris: Secondary | ICD-10-CM | POA: Diagnosis not present

## 2022-01-02 DIAGNOSIS — Z85828 Personal history of other malignant neoplasm of skin: Secondary | ICD-10-CM | POA: Diagnosis not present

## 2022-01-24 ENCOUNTER — Other Ambulatory Visit: Payer: Self-pay | Admitting: Family Medicine

## 2022-02-15 ENCOUNTER — Ambulatory Visit (INDEPENDENT_AMBULATORY_CARE_PROVIDER_SITE_OTHER): Payer: PPO | Admitting: Family Medicine

## 2022-02-15 ENCOUNTER — Encounter: Payer: Self-pay | Admitting: Family Medicine

## 2022-02-15 ENCOUNTER — Other Ambulatory Visit: Payer: Self-pay | Admitting: Family Medicine

## 2022-02-15 DIAGNOSIS — M5136 Other intervertebral disc degeneration, lumbar region: Secondary | ICD-10-CM | POA: Diagnosis not present

## 2022-02-15 DIAGNOSIS — G8929 Other chronic pain: Secondary | ICD-10-CM | POA: Diagnosis not present

## 2022-02-15 DIAGNOSIS — M5134 Other intervertebral disc degeneration, thoracic region: Secondary | ICD-10-CM | POA: Diagnosis not present

## 2022-02-15 DIAGNOSIS — K2100 Gastro-esophageal reflux disease with esophagitis, without bleeding: Secondary | ICD-10-CM

## 2022-02-15 DIAGNOSIS — Z9889 Other specified postprocedural states: Secondary | ICD-10-CM | POA: Diagnosis not present

## 2022-02-15 DIAGNOSIS — E785 Hyperlipidemia, unspecified: Secondary | ICD-10-CM | POA: Diagnosis not present

## 2022-02-15 DIAGNOSIS — M5090 Cervical disc disorder, unspecified, unspecified cervical region: Secondary | ICD-10-CM

## 2022-02-15 DIAGNOSIS — I10 Essential (primary) hypertension: Secondary | ICD-10-CM

## 2022-02-15 DIAGNOSIS — M546 Pain in thoracic spine: Secondary | ICD-10-CM

## 2022-02-15 MED ORDER — HYDROCHLOROTHIAZIDE 25 MG PO TABS: 25.0000 mg | ORAL_TABLET | Freq: Every day | ORAL | 1 refills | Status: DC

## 2022-02-15 MED ORDER — GABAPENTIN 600 MG PO TABS: 600.0000 mg | ORAL_TABLET | Freq: Three times a day (TID) | ORAL | 1 refills | Status: DC

## 2022-02-15 MED ORDER — OXYCODONE-ACETAMINOPHEN 10-325 MG PO TABS: 1.0000 | ORAL_TABLET | Freq: Three times a day (TID) | ORAL | 0 refills | Status: DC | PRN

## 2022-02-15 MED ORDER — ESCITALOPRAM OXALATE 20 MG PO TABS
20.0000 mg | ORAL_TABLET | Freq: Every day | ORAL | 1 refills | Status: DC
Start: 2022-02-15 — End: 2022-05-29

## 2022-02-15 MED ORDER — AMLODIPINE BESYLATE 10 MG PO TABS: 10.0000 mg | ORAL_TABLET | Freq: Every day | ORAL | 1 refills | Status: DC

## 2022-02-15 MED ORDER — SIMVASTATIN 10 MG PO TABS: 10.0000 mg | ORAL_TABLET | ORAL | 1 refills | Status: DC

## 2022-02-15 MED ORDER — BACLOFEN 20 MG PO TABS: 10.0000 mg | ORAL_TABLET | Freq: Three times a day (TID) | ORAL | 1 refills | Status: DC

## 2022-02-15 MED ORDER — PANTOPRAZOLE SODIUM 40 MG PO TBEC: 40.0000 mg | DELAYED_RELEASE_TABLET | Freq: Every day | ORAL | 3 refills | Status: DC

## 2022-02-15 MED ORDER — AMITRIPTYLINE HCL 50 MG PO TABS: 50.0000 mg | ORAL_TABLET | Freq: Every day | ORAL | 1 refills | Status: DC

## 2022-02-15 MED ORDER — LISINOPRIL 10 MG PO TABS: 10.0000 mg | ORAL_TABLET | Freq: Every day | ORAL | 1 refills | Status: DC

## 2022-02-15 NOTE — Patient Instructions (Signed)
Return for cpe (40 min), Routine chronic condition follow-up.        Great to see you today.  I have refilled the medication(s) we provide.   If labs were collected, we will inform you of lab results once received either by echart message or telephone call.   - echart message- for normal results that have been seen by the patient already.   - telephone call: abnormal results or if patient has not viewed results in their echart.

## 2022-02-15 NOTE — Progress Notes (Signed)
Patient Care Team    Relationship Specialty Notifications Start End  [name], DO PCP - General Family Medicine  10/31/15   [name], MD (Inactive) Consulting Physician Gastroenterology  04/24/16   [name], MD Consulting Physician Orthopedic Surgery  10/26/21     SUBJECTIVE Chief Complaint  Patient presents with   Pain Management    [facility]; pt is not fasting    HPI: Nicholas Graham is a 71 y.o. male present for chronic pain management Cervical neck pain with evidence of disc disease/Chronic midline thoracic back pain/Thoracic degenerative disc disease/DDD (degenerative disc disease), lumbar/History of lumbar discectomy Pt presents for his chronic pain management treatment for his cervical and lumbar conditions. His discomfort has progressed since 2020-he feels he has some  relief from current regimen. He has been evaluated  by neuro and pain rehab. He has received injections- that have only provided a few days of temporary relief in the past . He is frustrated, but has appropriate expectations that he will be living with some pain.  He reports he is maintaining a quality of life and able to remain active with Percocet 10-325 (1 tab every 8-12 hours), baclofen and gabapentin. 600/600/600 (had headaches at increased doses). He states he is walking a few miles a day.  He has an appointment in a couple weeks Dr. [name] to discuss MRI and surgical procedure for his neck pain. Indication for chronic opioid: Cervical spine degeneration, chronic neck and back pain. Medication and dose: Oxycodone-acetaminophen 10-325 1 tab TID prn daily # pills per month: 60 Last UDS date:UYD 06/2021 Pain contract signed (Y/N): Yes- UTD 08/2021 Date narcotic database last reviewed (/include red flags): 02/15/22 03/08/2021  had epidural injection of cervical spine and he feels it is helpful.  11/2021-has had repeat injections that have not been as helpful. Original note: Lower cervical to mid  thoracic pain still present. Using his right arm exacerbates his pain midline of thoracic.  He has a history thoracic spine degeneration and discectomy in his lumbar spine.  Has degenerative changes, bone spurring and facet arthropathy in his lumbar spine by x-rays obtained 11/2018. Cervical spine x-ray with mild bilateral foraminal narrowing at C3-C4 11/2018. Unfortunately he was reported as allergic or intolerant to many of the medications that would be helpful for him such as gabapentin, Lyrica, Celebrex and Cymbalta --> we discussed this in more detail last visit and he believes that the "allergy-hallucinations " was secondary to having multiple of these medicines on board at the same time back in 2014. We tried to start gabapentin and it did not have any side effects from medication. It has not been extremely helpful  Yet at lower doses. He admits to rather sigmnificant stomach upset starting from the naproxen BID use ober the last 8 week. Advanced imaging has not bee able to be completed 2/2 to covid outbreak. He desired to wait on referral to specialist last visit in hope that he would receive benefit from the gabapentin.   Hypertension/HLD/overweight: Pt reports compliance  with amlodipine 10 mg daily, lisinopril 10 mg QD and HCTZ. Patient denies chest pain, shortness of breath, dizziness or lower extremity edema.  Pt takes a daily baby ASA. Pt is  prescribed statin.  Labs UTD Diet: Low-sodium Exercise: Not exercising as routinely. RF: Hypertension, hyperlipidemia, family history of heart disease. Overweight.   ANXIETY/sleep disorder:  Patient reports he is feeling well on Lexapro 20 mg daily and would like to continue.  Elavil  low dose started last appt he feels overall it is working well.  He is sleeping better but still wakes approximately 2 times a night.  He does not feel any extra sedation throughout his day from medication.   Gerd: Patient reports he is still requiring PPI daily use in  order to avoid symptoms.  ROS: See pertinent positives and negatives per HPI.  Patient Active Problem List   Diagnosis Date Noted   Foraminal stenosis of cervical region 11/07/2021   S/P right rotator cuff repair 08/02/2021   Biceps tendinosis of right shoulder 06/05/2021   Pain management contract agreement 09/12/2020   Cervical neck pain with evidence of disc disease 01/15/2019   Chronic midline thoracic back pain 01/15/2019   Thoracic degenerative disc disease 01/15/2019   History of lumbar discectomy 01/15/2019   DDD (degenerative disc disease), lumbar 01/15/2019   OSA on CPAP 07/04/2018   Benign prostatic hyperplasia with lower urinary tract symptoms 11/15/2017   Anxiety 09/12/2017   Stopped smoking with greater than 30 pack year history 07/30/2017   Pulmonary lesion 05/16/2017   Elevated hemoglobin A1c 02/19/2014   Dyslipidemia 02/19/2014   Essential hypertension, benign 02/19/2014   History of colonic polyps 09/23/2009   GERD 12/12/2007    Social History   Tobacco Use   Smoking status: Former    Packs/day: 3.00    Years: 30.00    Pack years: 90.00    Types: Cigarettes    Quit date: 07/16/2002    Years since quitting: 19.6   Smokeless tobacco: Never  Substance Use Topics   Alcohol use: Yes    Alcohol/week: 1.0 - 2.0 standard drink    Types: 1 - 2 Cans of beer per week    Comment: socially    Current Outpatient Medications:    aspirin 81 MG tablet, Take 81 mg by mouth daily., Disp: , Rfl:    MegaRed Omega-3 Krill Oil 500 MG CAPS, Take 2 capsules by mouth daily. , Disp: , Rfl:    amitriptyline (ELAVIL) 50 MG tablet, Take 1 tablet (50 mg total) by mouth at bedtime., Disp: 90 tablet, Rfl: 1   amLODipine (NORVASC) 10 MG tablet, Take 1 tablet (10 mg total) by mouth daily., Disp: 90 tablet, Rfl: 1   baclofen (LIORESAL) 20 MG tablet, Take 0.5-1 tablets (10-20 mg total) by mouth 3 (three) times daily., Disp: 270 tablet, Rfl: 1   escitalopram (LEXAPRO) 20 MG tablet, Take  1 tablet (20 mg total) by mouth daily., Disp: 90 tablet, Rfl: 1   gabapentin (NEURONTIN) 600 MG tablet, Take 1 tablet (600 mg total) by mouth 3 (three) times daily. 1 tab morning, 1.5 tabs afternoon, 1 tab evening,  doses should be every 8 hours., Disp: 270 tablet, Rfl: 1   hydrochlorothiazide (HYDRODIURIL) 25 MG tablet, Take 1 tablet (25 mg total) by mouth daily., Disp: 90 tablet, Rfl: 1   lisinopril (ZESTRIL) 10 MG tablet, Take 1 tablet (10 mg total) by mouth daily., Disp: 90 tablet, Rfl: 1   oxyCODONE-acetaminophen (PERCOCET) 10-325 MG tablet, Take 1 tablet by mouth every 8 (eight) hours as needed for pain., Disp: 90 tablet, Rfl: 0   oxyCODONE-acetaminophen (PERCOCET) 10-325 MG tablet, Take 1 tablet by mouth every 8 (eight) hours as needed for pain., Disp: 90 tablet, Rfl: 0   oxyCODONE-acetaminophen (PERCOCET) 10-325 MG tablet, Take 1 tablet by mouth every 8 (eight) hours as needed for pain., Disp: 90 tablet, Rfl: 0   pantoprazole (PROTONIX) 40 MG tablet, Take 1  tablet (40 mg total) by mouth daily., Disp: 90 tablet, Rfl: 3   simvastatin (ZOCOR) 10 MG tablet, Take 1 tablet (10 mg total) by mouth every other day., Disp: 45 tablet, Rfl: 1  Allergies  Allergen Reactions   Penicillins Hives   Celebrex [Celecoxib]    Cymbalta [Duloxetine Hcl]    Lyrica [Pregabalin]     OBJECTIVE: BP 107/66   Pulse 92   Temp 98 F (36.7 C) (Oral)   Ht '6\' 2"'$  (1.88 m)   Wt 212 lb (96.2 kg)   SpO2 93%   BMI 27.22 kg/m   Physical Exam Vitals and nursing note reviewed.  Constitutional:      General: He is not in acute distress.    Appearance: Normal appearance. He is not ill-appearing, toxic-appearing or diaphoretic.  HENT:     Head: Normocephalic and atraumatic.  Eyes:     General: No scleral icterus.       Right eye: No discharge.        Left eye: No discharge.     Extraocular Movements: Extraocular movements intact.     Pupils: Pupils are equal, round, and reactive to light.  Cardiovascular:      Rate and Rhythm: Normal rate and regular rhythm.  Pulmonary:     Effort: Pulmonary effort is normal. No respiratory distress.     Breath sounds: Normal breath sounds. No wheezing, rhonchi or rales.  Musculoskeletal:     Cervical back: Neck supple.     Right lower leg: No edema.     Left lower leg: No edema.  Lymphadenopathy:     Cervical: No cervical adenopathy.  Skin:    General: Skin is warm and dry.     Coloration: Skin is not jaundiced or pale.     Findings: No rash.  Neurological:     Mental Status: He is alert and oriented to person, place, and time. Mental status is at baseline.  Psychiatric:        Mood and Affect: Mood normal.        Behavior: Behavior normal.        Thought Content: Thought content normal.        Judgment: Judgment normal.     ASSESSMENT AND PLAN: HARBOR VANOVER is a 71 y.o. male present for  Cervical neck pain with evidence of disc disease/Chronic midline thoracic back pain/Thoracic degenerative disc disease/DDD (degenerative disc disease), lumbar/History of lumbar discectomy He is following with sports medicine, physical therapy and recently had cervical injections-again.  encounter every 3 months for pain management appt only.  - current pain management is helping him maintain activity and improve his quality of life. -[place] controlled substance database reviewed and appropriate 02/15/22  - Pain contract signed > updated 09/12/2021 - UDS > 06/21/2021 completed -Patient discontinued Mobic. -continue gabapentin  600/600/600> did not tolerate higher dose (headache) -continue  baclofen 10-20 mg 3 times daily -continue  percocet 10-325 mg tID #90 prescribed with 2 refills (held) at pharmacy.  -Has appointment with Dr. [name] to further evaluate neck pain with MRI and consideration of cervical surgical procedure. - f/u 3 mos   Essential hypertension, benign/dyslipidemia/overweight Stable Continue amlodipine 10 mg daily  -continue   HCTZ. -continue Zocor 10 mg daily.   - continue fish oil. - continue ASA 81 mg - routine diet and exercise. F/u q 3 mos with other conditions.    Gastroesophageal reflux disease with esophagitis Stable Continue Protonix PRN   Anxiety/sleep disturbance:  Improving,  but still could use more improvement. Increase Elavil to 50 mg nightly. - continue lexapro 20 mg qd.    Return for cpe (40 min), Routine chronic condition follow-up.  No orders of the defined types were placed in this encounter.    Meds ordered this encounter  Medications   amitriptyline (ELAVIL) 50 MG tablet    Sig: Take 1 tablet (50 mg total) by mouth at bedtime.    Dispense:  90 tablet    Refill:  1    Dc lower dose   amLODipine (NORVASC) 10 MG tablet    Sig: Take 1 tablet (10 mg total) by mouth daily.    Dispense:  90 tablet    Refill:  1    This prescription was filled on 06/19/2019. Any refills authorized will be placed on file.   baclofen (LIORESAL) 20 MG tablet    Sig: Take 0.5-1 tablets (10-20 mg total) by mouth 3 (three) times daily.    Dispense:  270 tablet    Refill:  1   escitalopram (LEXAPRO) 20 MG tablet    Sig: Take 1 tablet (20 mg total) by mouth daily.    Dispense:  90 tablet    Refill:  1    This prescription was filled on 05/12/2019. Any refills authorized will be placed on file.   gabapentin (NEURONTIN) 600 MG tablet    Sig: Take 1 tablet (600 mg total) by mouth 3 (three) times daily. 1 tab morning, 1.5 tabs afternoon, 1 tab evening,  doses should be every 8 hours.    Dispense:  270 tablet    Refill:  1    Pt reports not true allergy- was a combination of meds that caused symptoms.   hydrochlorothiazide (HYDRODIURIL) 25 MG tablet    Sig: Take 1 tablet (25 mg total) by mouth daily.    Dispense:  90 tablet    Refill:  1   lisinopril (ZESTRIL) 10 MG tablet    Sig: Take 1 tablet (10 mg total) by mouth daily.    Dispense:  90 tablet    Refill:  1   simvastatin (ZOCOR) 10 MG tablet     Sig: Take 1 tablet (10 mg total) by mouth every other day.    Dispense:  45 tablet    Refill:  1   pantoprazole (PROTONIX) 40 MG tablet    Sig: Take 1 tablet (40 mg total) by mouth daily.    Dispense:  90 tablet    Refill:  3    This prescription was filled on 06/19/2019. Any refills authorized will be placed on file.   oxyCODONE-acetaminophen (PERCOCET) 10-325 MG tablet    Sig: Take 1 tablet by mouth every 8 (eight) hours as needed for pain.    Dispense:  90 tablet    Refill:  0   oxyCODONE-acetaminophen (PERCOCET) 10-325 MG tablet    Sig: Take 1 tablet by mouth every 8 (eight) hours as needed for pain.    Dispense:  90 tablet    Refill:  0    May fill ~25 days after prescribed date   oxyCODONE-acetaminophen (PERCOCET) 10-325 MG tablet    Sig: Take 1 tablet by mouth every 8 (eight) hours as needed for pain.    Dispense:  90 tablet    Refill:  0    May fill ~55 days after prescribed date        Howard Pouch, DO 02/15/2022

## 2022-03-07 ENCOUNTER — Ambulatory Visit: Payer: PPO | Admitting: Orthopaedic Surgery

## 2022-03-07 VITALS — BP 119/70 | HR 80

## 2022-03-07 DIAGNOSIS — M67813 Other specified disorders of tendon, right shoulder: Secondary | ICD-10-CM | POA: Diagnosis not present

## 2022-03-07 DIAGNOSIS — M4802 Spinal stenosis, cervical region: Secondary | ICD-10-CM

## 2022-03-07 DIAGNOSIS — Z9889 Other specified postprocedural states: Secondary | ICD-10-CM

## 2022-03-07 NOTE — Progress Notes (Signed)
Office Visit Note   Patient: Nicholas Graham           Date of Birth: 02-14-1951           MRN: [id number] Visit Date: 03/07/2022              Requested by: [name], DO [address] [place],  [place] [id number] PCP: [name], DO   Assessment & Plan: Visit Diagnoses:  1. Foraminal stenosis of cervical region   2. Biceps tendinosis of right shoulder   3. S/P right rotator cuff repair     Plan: Patient needs new cervical MRI scan he may need two-level cervical fusion for his foraminal stenosis on the right which is severe at C3-4 C4-5.  Mild changes down at C6-7 without compression.  Office follow-up after scan.  Patient states his pain has been persistent and has been through physical therapy for shoulder and neck and did not get relief as well as taken anti-inflammatories.  Patient is in pain management prescribed elsewhere and is on Percocet 10/325 and gets 90 tablets monthly.  Follow-up after MRI scan cervical spine.  Follow-Up Instructions: No follow-ups on file.   Orders:  Orders Placed This Encounter  Procedures   MR Cervical Spine w/o contrast   No orders of the defined types were placed in this encounter.     Procedures: No procedures performed   Clinical Data: No additional findings.   Subjective: Chief Complaint  Patient presents with   Right Shoulder - Follow-up    06/26/21 right shoulder scope, RCR, BT     HPI 8 months post rotator cuff repair biceps tenodesis with some persistent pain in his neck that radiates into his right shoulder.  He can reach over his head but still has some difficulty doing this.  He has pain when he rotates his neck.  Pain radiates down to the medial border of the scapula.  He has increased pain with tilting his neck to the side.  Dr. Amalia Hailey ordered a cervical MRI scan more than a year ago and that showed trace retrolisthesis at C3-4 and C4-5 with moderate to severe foraminal narrowing on the right at both levels.  Patient  Nuys any numbness or tingling problems in his hand.  Review of Systems All the systems update noncontributory to HPI.  Objective: Vital Signs: BP 119/70   Pulse 80   Physical Exam Constitutional:      Appearance: He is well-developed.  HENT:     Head: Normocephalic and atraumatic.     Right Ear: External ear normal.     Left Ear: External ear normal.  Eyes:     Pupils: Pupils are equal, round, and reactive to light.  Neck:     Thyroid: No thyromegaly.     Trachea: No tracheal deviation.  Cardiovascular:     Rate and Rhythm: Normal rate.  Pulmonary:     Effort: Pulmonary effort is normal.     Breath sounds: No wheezing.  Abdominal:     General: Bowel sounds are normal.     Palpations: Abdomen is soft.  Musculoskeletal:     Cervical back: Neck supple.  Skin:    General: Skin is warm and dry.     Capillary Refill: Capillary refill takes less than 2 seconds.  Neurological:     Mental Status: He is alert and oriented to person, place, and time.  Psychiatric:        Behavior: Behavior normal.  Thought Content: Thought content normal.        Judgment: Judgment normal.     Ortho Exam patient takes good resistive strength to the right rotator cuff.  He has brachial plexus tenderness increased pain with Spurling maneuver on the right side negative on the left.  Some increased discomfort with cervical compression no relief with distraction.  Upper extremity reflexes are 2+.  Biceps tenodesis is intact by physical exam.  Normal heel-toe gait no myelopathic gait changes no lower extremity hyperreflexia.  Negative Lhermitte.   Specialty Comments:  No specialty comments available.  Imaging: No results found.   PMFS History: Patient Active Problem List   Diagnosis Date Noted   Foraminal stenosis of cervical region 11/07/2021   S/P right rotator cuff repair 08/02/2021   Biceps tendinosis of right shoulder 06/05/2021   Pain management contract agreement 09/12/2020    Cervical neck pain with evidence of disc disease 01/15/2019   Chronic midline thoracic back pain 01/15/2019   Thoracic degenerative disc disease 01/15/2019   History of lumbar discectomy 01/15/2019   DDD (degenerative disc disease), lumbar 01/15/2019   OSA on CPAP 07/04/2018   Benign prostatic hyperplasia with lower urinary tract symptoms 11/15/2017   Anxiety 09/12/2017   Stopped smoking with greater than 30 pack year history 07/30/2017   Pulmonary lesion 05/16/2017   Elevated hemoglobin A1c 02/19/2014   Dyslipidemia 02/19/2014   Essential hypertension, benign 02/19/2014   History of colonic polyps 09/23/2009   GERD 12/12/2007   Past Medical History:  Diagnosis Date   Arm fracture    left. Dx'd w/RSD post fracture   Carpal tunnel syndrome    Chest pain 2016   [facility] (Dr. Matthew Saras) doing stress echo, but feels like musculoskeletal chest wall pain is cause of his discomfort   Chronic back pain    COVID-19 06/2020   Deviated septum    Diverticulosis    GERD (gastroesophageal reflux disease)    Hyperlipidemia    Hypertension    Joint pain    Muscle pain    Nephrolithiasis    OSA on CPAP 07/04/2018   - Trial of CPAP therapy on 17 cm H2O with a Large size Fisher&Paykel Full Face Mask Simplus mask and heated humidification.   Plantar fasciitis    Pulmonary nodule 04/2016   Lingula.  Pt chose to repeat CT chest w/out contrast in 3 mo---as per radiologist's recommendations.   Right inguinal hernia    fat-containing.  Small hydrocele in left hemiscrotum.   Snoring    Swelling    of legs/feet/hands   Tobacco dependence in remission     Family History  Problem Relation Age of Onset   Lung cancer Mother    Lung cancer Father    Bone cancer Brother        bone   Heart disease Maternal Uncle    Colon cancer Neg Hx     Past Surgical History:  Procedure Laterality Date   COLONOSCOPY     fracture repair left arm     had a fixator   INGUINAL HERNIA REPAIR Left 2013    repeat hernia repair. x2   KNEE ARTHROSCOPY Right    LUMBAR [place] inguinal after hernia repair Left    PLANTAR FASCIA SURGERY Right 2000s   UPPER GI ENDOSCOPY     Social History   Occupational History   Not on file  Tobacco Use   Smoking status: Former    Packs/day:  3.00    Years: 30.00    Total pack years: 90.00    Types: Cigarettes    Quit date: 07/16/2002    Years since quitting: 19.6   Smokeless tobacco: Never  Vaping Use   Vaping Use: Never used  Substance and Sexual Activity   Alcohol use: Yes    Alcohol/week: 1.0 - 2.0 standard drink of alcohol    Types: 1 - 2 Cans of beer per week    Comment: socially   Drug use: No   Sexual activity: Yes

## 2022-03-13 ENCOUNTER — Ambulatory Visit
Admission: RE | Admit: 2022-03-13 | Discharge: 2022-03-13 | Disposition: A | Discharge status: Home / Self Care | Payer: PPO | Source: Ambulatory Visit | Attending: Orthopaedic Surgery | Admitting: Orthopaedic Surgery

## 2022-03-13 DIAGNOSIS — S22009A Unspecified fracture of unspecified thoracic vertebra, initial encounter for closed fracture: Secondary | ICD-10-CM | POA: Diagnosis not present

## 2022-03-13 DIAGNOSIS — M50221 Other cervical disc displacement at C4-C5 level: Secondary | ICD-10-CM | POA: Diagnosis not present

## 2022-03-13 DIAGNOSIS — M4802 Spinal stenosis, cervical region: Secondary | ICD-10-CM

## 2022-03-13 DIAGNOSIS — M50222 Other cervical disc displacement at C5-C6 level: Secondary | ICD-10-CM | POA: Diagnosis not present

## 2022-03-18 ENCOUNTER — Other Ambulatory Visit: Payer: PPO

## 2022-03-21 ENCOUNTER — Ambulatory Visit: Payer: PPO | Admitting: Orthopaedic Surgery

## 2022-03-21 VITALS — Ht 74.0 in | Wt 212.0 lb

## 2022-03-21 DIAGNOSIS — M5090 Cervical disc disorder, unspecified, unspecified cervical region: Secondary | ICD-10-CM | POA: Diagnosis not present

## 2022-03-21 NOTE — Progress Notes (Unsigned)
Office Visit Note   Patient: Nicholas Graham           Date of Birth: [birth year]           MRN: [id number] Visit Date: 03/21/2022              Requested by: [name], DO [address] [place],  [place] [address] PCP: [name], DO   Assessment & Plan: Visit Diagnoses: No diagnosis found.  Plan: ***  Follow-Up Instructions: No follow-ups on file.   Orders:  No orders of the defined types were placed in this encounter.  No orders of the defined types were placed in this encounter.     Procedures: No procedures performed   Clinical Data: No additional findings.   Subjective: Chief Complaint  Patient presents with   Neck - Follow-up    HPI  Review of Systems   Objective: Vital Signs: Ht '6\' 2"'$  (1.88 m)   Wt 212 lb (96.2 kg)   BMI 27.22 kg/m   Physical Exam  Ortho Exam  Specialty Comments:  No specialty comments available.  Imaging: No results found.   PMFS History: Patient Active Problem List   Diagnosis Date Noted   Foraminal stenosis of cervical region 11/07/2021   S/P right rotator cuff repair 08/02/2021   Biceps tendinosis of right shoulder 06/05/2021   Pain management contract agreement 09/12/2020   Cervical neck pain with evidence of disc disease 01/15/2019   Chronic midline thoracic back pain 01/15/2019   Thoracic degenerative disc disease 01/15/2019   History of lumbar discectomy 01/15/2019   DDD (degenerative disc disease), lumbar 01/15/2019   OSA on CPAP 07/04/2018   Benign prostatic hyperplasia with lower urinary tract symptoms 11/15/2017   Anxiety 09/12/2017   Stopped smoking with greater than 30 pack year history 07/30/2017   Pulmonary lesion 05/16/2017   Elevated hemoglobin A1c 02/19/2014   Dyslipidemia 02/19/2014   Essential hypertension, benign 02/19/2014   History of colonic polyps 09/23/2009   GERD 12/12/2007   Past Medical History:  Diagnosis Date   Arm fracture    left. Dx'd w/RSD post fracture   Carpal  tunnel syndrome    Chest pain 2016   [facility] (Dr. Matthew Saras) doing stress echo, but feels like musculoskeletal chest wall pain is cause of his discomfort   Chronic back pain    COVID-19 06/2020   Deviated septum    Diverticulosis    GERD (gastroesophageal reflux disease)    Hyperlipidemia    Hypertension    Joint pain    Muscle pain    Nephrolithiasis    OSA on CPAP 07/04/2018   - Trial of CPAP therapy on 17 cm H2O with a Large size Fisher&Paykel Full Face Mask Simplus mask and heated humidification.   Plantar fasciitis    Pulmonary nodule 04/2016   Lingula.  Pt chose to repeat CT chest w/out contrast in 3 mo---as per radiologist's recommendations.   Right inguinal hernia    fat-containing.  Small hydrocele in left hemiscrotum.   Snoring    Swelling    of legs/feet/hands   Tobacco dependence in remission     Family History  Problem Relation Age of Onset   Lung cancer Mother    Lung cancer Father    Bone cancer Brother        bone   Heart disease Maternal Uncle    Colon cancer Neg Hx     Past Surgical History:  Procedure Laterality Date  COLONOSCOPY     fracture repair left arm     had a fixator   INGUINAL HERNIA REPAIR Left 2013   repeat hernia repair. x2   KNEE ARTHROSCOPY Right    LUMBAR DISC SURGERY  1993   NEURECTOMY inguinal after hernia repair Left    PLANTAR FASCIA SURGERY Right 2000s   UPPER GI ENDOSCOPY     Social History   Occupational History   Not on file  Tobacco Use   Smoking status: Former    Packs/day: 3.00    Years: 30.00    Total pack years: 90.00    Types: Cigarettes    Quit date: 07/16/2002    Years since quitting: 19.6   Smokeless tobacco: Never  Vaping Use   Vaping Use: Never used  Substance and Sexual Activity   Alcohol use: Yes    Alcohol/week: 1.0 - 2.0 standard drink of alcohol    Types: 1 - 2 Cans of beer per week    Comment: socially   Drug use: No   Sexual activity: Yes

## 2022-03-29 DIAGNOSIS — Z129 Encounter for screening for malignant neoplasm, site unspecified: Secondary | ICD-10-CM | POA: Diagnosis not present

## 2022-03-29 DIAGNOSIS — L700 Acne vulgaris: Secondary | ICD-10-CM | POA: Diagnosis not present

## 2022-03-29 DIAGNOSIS — Z85828 Personal history of other malignant neoplasm of skin: Secondary | ICD-10-CM | POA: Diagnosis not present

## 2022-04-23 DIAGNOSIS — M4722 Other spondylosis with radiculopathy, cervical region: Secondary | ICD-10-CM | POA: Diagnosis not present

## 2022-04-23 DIAGNOSIS — M25511 Pain in right shoulder: Secondary | ICD-10-CM | POA: Diagnosis not present

## 2022-04-30 ENCOUNTER — Other Ambulatory Visit: Payer: Self-pay | Admitting: Orthopedic Surgery

## 2022-04-30 DIAGNOSIS — M25511 Pain in right shoulder: Secondary | ICD-10-CM

## 2022-05-01 ENCOUNTER — Ambulatory Visit
Admission: RE | Admit: 2022-05-01 | Discharge: 2022-05-01 | Disposition: A | Discharge status: Home / Self Care | Payer: PPO | Source: Ambulatory Visit | Attending: Orthopedic Surgery | Admitting: Orthopedic Surgery

## 2022-05-01 DIAGNOSIS — R600 Localized edema: Secondary | ICD-10-CM | POA: Diagnosis not present

## 2022-05-01 DIAGNOSIS — M7551 Bursitis of right shoulder: Secondary | ICD-10-CM | POA: Diagnosis not present

## 2022-05-01 DIAGNOSIS — R531 Weakness: Secondary | ICD-10-CM | POA: Diagnosis not present

## 2022-05-01 DIAGNOSIS — M19011 Primary osteoarthritis, right shoulder: Secondary | ICD-10-CM | POA: Diagnosis not present

## 2022-05-01 DIAGNOSIS — M25511 Pain in right shoulder: Secondary | ICD-10-CM

## 2022-05-02 ENCOUNTER — Other Ambulatory Visit: Payer: PPO

## 2022-05-07 DIAGNOSIS — M9971 Connective tissue and disc stenosis of intervertebral foramina of cervical region: Secondary | ICD-10-CM | POA: Diagnosis not present

## 2022-05-07 DIAGNOSIS — M5412 Radiculopathy, cervical region: Secondary | ICD-10-CM | POA: Diagnosis not present

## 2022-05-11 DIAGNOSIS — M7918 Myalgia, other site: Secondary | ICD-10-CM | POA: Diagnosis not present

## 2022-05-18 ENCOUNTER — Encounter: Payer: PPO | Admitting: Family Medicine

## 2022-05-25 ENCOUNTER — Other Ambulatory Visit: Payer: Self-pay | Admitting: Family Medicine

## 2022-05-25 DIAGNOSIS — G8929 Other chronic pain: Secondary | ICD-10-CM

## 2022-05-25 DIAGNOSIS — M5090 Cervical disc disorder, unspecified, unspecified cervical region: Secondary | ICD-10-CM

## 2022-05-25 DIAGNOSIS — M5134 Other intervertebral disc degeneration, thoracic region: Secondary | ICD-10-CM

## 2022-05-25 DIAGNOSIS — M5136 Other intervertebral disc degeneration, lumbar region: Secondary | ICD-10-CM

## 2022-05-25 DIAGNOSIS — Z9889 Other specified postprocedural states: Secondary | ICD-10-CM

## 2022-05-25 DIAGNOSIS — I10 Essential (primary) hypertension: Secondary | ICD-10-CM

## 2022-05-29 ENCOUNTER — Ambulatory Visit (INDEPENDENT_AMBULATORY_CARE_PROVIDER_SITE_OTHER): Payer: PPO | Admitting: Family Medicine

## 2022-05-29 ENCOUNTER — Encounter: Payer: Self-pay | Admitting: Family Medicine

## 2022-05-29 VITALS — BP 106/62 | HR 73 | Temp 97.7°F | Ht 73.0 in | Wt 205.0 lb

## 2022-05-29 DIAGNOSIS — M51369 Other intervertebral disc degeneration, lumbar region without mention of lumbar back pain or lower extremity pain: Secondary | ICD-10-CM

## 2022-05-29 DIAGNOSIS — G4733 Obstructive sleep apnea (adult) (pediatric): Secondary | ICD-10-CM

## 2022-05-29 DIAGNOSIS — M5134 Other intervertebral disc degeneration, thoracic region: Secondary | ICD-10-CM | POA: Diagnosis not present

## 2022-05-29 DIAGNOSIS — K2100 Gastro-esophageal reflux disease with esophagitis, without bleeding: Secondary | ICD-10-CM

## 2022-05-29 DIAGNOSIS — Z9889 Other specified postprocedural states: Secondary | ICD-10-CM

## 2022-05-29 DIAGNOSIS — Z0000 Encounter for general adult medical examination without abnormal findings: Secondary | ICD-10-CM | POA: Diagnosis not present

## 2022-05-29 DIAGNOSIS — Z9989 Dependence on other enabling machines and devices: Secondary | ICD-10-CM | POA: Diagnosis not present

## 2022-05-29 DIAGNOSIS — M5090 Cervical disc disorder, unspecified, unspecified cervical region: Secondary | ICD-10-CM

## 2022-05-29 DIAGNOSIS — I10 Essential (primary) hypertension: Secondary | ICD-10-CM | POA: Diagnosis not present

## 2022-05-29 DIAGNOSIS — E785 Hyperlipidemia, unspecified: Secondary | ICD-10-CM | POA: Diagnosis not present

## 2022-05-29 DIAGNOSIS — M5136 Other intervertebral disc degeneration, lumbar region: Secondary | ICD-10-CM | POA: Diagnosis not present

## 2022-05-29 DIAGNOSIS — M546 Pain in thoracic spine: Secondary | ICD-10-CM | POA: Diagnosis not present

## 2022-05-29 DIAGNOSIS — F419 Anxiety disorder, unspecified: Secondary | ICD-10-CM | POA: Diagnosis not present

## 2022-05-29 DIAGNOSIS — Z23 Encounter for immunization: Secondary | ICD-10-CM

## 2022-05-29 DIAGNOSIS — Z125 Encounter for screening for malignant neoplasm of prostate: Secondary | ICD-10-CM

## 2022-05-29 DIAGNOSIS — G8929 Other chronic pain: Secondary | ICD-10-CM | POA: Diagnosis not present

## 2022-05-29 DIAGNOSIS — Z79899 Other long term (current) drug therapy: Secondary | ICD-10-CM

## 2022-05-29 LAB — CBC
HCT: 45.1 % (ref 39.0–52.0)
Hemoglobin: 15 g/dL (ref 13.0–17.0)
MCHC: 33.3 g/dL (ref 30.0–36.0)
MCV: 91 fl (ref 78.0–100.0)
Platelets: 147 10*3/uL — ABNORMAL LOW (ref 150.0–400.0)
RBC: 4.95 Mil/uL (ref 4.22–5.81)
RDW: 14.5 % (ref 11.5–15.5)
WBC: 4.7 10*3/uL (ref 4.0–10.5)

## 2022-05-29 LAB — COMPREHENSIVE METABOLIC PANEL
ALT: 13 U/L (ref 0–53)
AST: 19 U/L (ref 0–37)
Albumin: 4.4 g/dL (ref 3.5–5.2)
Alkaline Phosphatase: 61 U/L (ref 39–117)
BUN: 16 mg/dL (ref 6–23)
CO2: 27 mEq/L (ref 19–32)
Calcium: 9.5 mg/dL (ref 8.4–10.5)
Chloride: 103 mEq/L (ref 96–112)
Creatinine, Ser: 0.93 mg/dL (ref 0.40–1.50)
GFR: 84.16 mL/min (ref >60.00)
Glucose, Bld: 110 mg/dL — ABNORMAL HIGH (ref 70–99)
Potassium: 4.1 mEq/L (ref 3.5–5.1)
Sodium: 140 mEq/L (ref 135–145)
Total Bilirubin: 1 mg/dL (ref 0.2–1.2)
Total Protein: 6.7 g/dL (ref 6.0–8.3)

## 2022-05-29 LAB — PSA: PSA: 1.79 ng/mL (ref 0.10–4.00)

## 2022-05-29 LAB — LIPID PANEL
Cholesterol: 153 mg/dL (ref 0–200)
HDL: 46.3 mg/dL (ref >39.00)
LDL Cholesterol: 96 mg/dL (ref 0–99)
NonHDL: 107.19
Total CHOL/HDL Ratio: 3
Triglycerides: 58 mg/dL (ref 0.0–149.0)
VLDL: 11.6 mg/dL (ref 0.0–40.0)

## 2022-05-29 LAB — HEMOGLOBIN A1C: Hgb A1c MFr Bld: 5.9 % (ref 4.6–6.5)

## 2022-05-29 LAB — TSH: TSH: 1.32 u[IU]/mL (ref 0.35–5.50)

## 2022-05-29 MED ORDER — ESCITALOPRAM OXALATE 20 MG PO TABS: 20.0000 mg | ORAL_TABLET | Freq: Every day | ORAL | 1 refills | Status: DC

## 2022-05-29 MED ORDER — SIMVASTATIN 10 MG PO TABS: 10.0000 mg | ORAL_TABLET | ORAL | 1 refills | Status: DC

## 2022-05-29 MED ORDER — AMITRIPTYLINE HCL 25 MG PO TABS: 25.0000 mg | ORAL_TABLET | Freq: Every day | ORAL | 1 refills | Status: DC

## 2022-05-29 MED ORDER — DIAZEPAM 5 MG PO TABS: ORAL_TABLET | ORAL | 0 refills | Status: DC

## 2022-05-29 MED ORDER — AMLODIPINE BESYLATE 10 MG PO TABS: 10.0000 mg | ORAL_TABLET | Freq: Every day | ORAL | 1 refills | Status: DC

## 2022-05-29 MED ORDER — LISINOPRIL 10 MG PO TABS: 10.0000 mg | ORAL_TABLET | Freq: Every day | ORAL | 1 refills | Status: DC

## 2022-05-29 MED ORDER — GABAPENTIN 600 MG PO TABS: 600.0000 mg | ORAL_TABLET | Freq: Three times a day (TID) | ORAL | 1 refills | Status: DC

## 2022-05-29 MED ORDER — HYDROCHLOROTHIAZIDE 25 MG PO TABS: 25.0000 mg | ORAL_TABLET | Freq: Every day | ORAL | 1 refills | Status: DC

## 2022-05-29 MED ORDER — PANTOPRAZOLE SODIUM 40 MG PO TBEC: 40.0000 mg | DELAYED_RELEASE_TABLET | Freq: Every day | ORAL | 3 refills | Status: DC

## 2022-05-29 MED ORDER — BACLOFEN 20 MG PO TABS: 10.0000 mg | ORAL_TABLET | Freq: Three times a day (TID) | ORAL | 1 refills | Status: DC

## 2022-05-29 NOTE — Progress Notes (Signed)
Patient ID: [name], male  DOB: Dec 17, 1950, 71 y.o.   MRN: [id number] Patient Care Team    Relationship Specialty Notifications Start End  [name], DO PCP - General Family Medicine  10/31/15   [name], MD (Inactive) Consulting Physician Gastroenterology  04/24/16   [name], MD Consulting Physician Orthopedic Surgery  10/26/21     Chief Complaint  Patient presents with   Annual Exam    Pt is fasting    Subjective:  Nicholas Graham is a 71 y.o. male present for CPE and chronic medical condition appointment All past medical history, surgical history, allergies, family history, immunizations, medications and social history were updated in the electronic medical record today. All recent labs, ED visits and hospitalizations within the last year were reviewed.  Health maintenance:  Colonoscopy: last screen 06/2016, recommend follow up 5 years; resulted polyps present. Completed by Dr. Havery Moros.  This has been delayed secondary to patient not able to lay on his right side because of shoulder/neck pain Immunizations:  tdap declined, influenza declined, PNA series declined, zostavax declined Infectious disease screening: Hep C completed. If not completed prior, screening test offered. PSA: Collected today Lab Results  Component Value Date   PSA 1.96 06/21/2021   PSA 1.64 05/31/2020   PSA 2.65 05/15/2017  , pt was counseled on prostate cancer screenings.  Assistive device: None Oxygen use: None* Patient has a Dental home. Hospitalizations/ED visits: Reviewed  Cervical neck pain with evidence of disc disease/Chronic midline thoracic back pain/Thoracic degenerative disc disease/DDD (degenerative disc disease), lumbar/History of lumbar discectomy Pt presents for his chronic pain management treatment for his cervical and lumbar conditions. His discomfort has progressed since 2020-he feels he has some  relief from current regimen. He has been evaluated  by  neuro and pain rehab. He has received injections- that have only provided a few days of temporary relief in the past . He is frustrated, but has appropriate expectations that he will be living with some pain.  He reports he is maintaining a quality of life and able to remain active with Percocet 10-325 (1 tab every 8-12 hours), baclofen and gabapentin. 600/600/600 (had headaches at increased doses). He states he is walking a few miles a day.  He has an appointment in a couple weeks Dr. [name] to discuss MRI and surgical procedure for his neck pain. Indication for chronic opioid: Cervical spine degeneration, chronic neck and back pain. Medication and dose: Oxycodone-acetaminophen 10-325 1 tab TID prn daily # pills per month: 60 Last UDS date:UYD 06/2021 Pain contract signed (Y/N): Yes- UTD 08/2021 Date narcotic database last reviewed (/include red flags): 02/15/22 03/08/2021  had epidural injection of cervical spine and he feels it is helpful.  11/2021-has had repeat injections that have not been as helpful. Original note: Lower cervical to mid thoracic pain still present. Using his right arm exacerbates his pain midline of thoracic.  He has a history thoracic spine degeneration and discectomy in his lumbar spine.  Has degenerative changes, bone spurring and facet arthropathy in his lumbar spine by x-rays obtained 11/2018. Cervical spine x-ray with mild bilateral foraminal narrowing at C3-C4 11/2018. Unfortunately he was reported as allergic or intolerant to many of the medications that would be helpful for him such as gabapentin, Lyrica, Celebrex and Cymbalta --> we discussed this in more detail last visit and he believes that the "allergy-hallucinations " was secondary to having multiple of these medicines on board at the  same time back in 2014. We tried to start gabapentin and it did not have any side effects from medication. It has not been extremely helpful  Yet at lower doses. He admits to rather  sigmnificant stomach upset starting from the naproxen BID use ober the last 8 week. Advanced imaging has not bee able to be completed 2/2 to covid outbreak. He desired to wait on referral to specialist last visit in hope that he would receive benefit from the gabapentin.   Hypertension/HLD/overweight: Pt reports compliance  with amlodipine 10 mg daily, lisinopril 10 mg QD and HCTZ. Patient denies chest pain, shortness of breath, dizziness or lower extremity edema.  Pt takes a daily baby ASA. Pt is  prescribed statin.  Labs UTD Diet: Low-sodium Exercise: Not exercising as routinely. RF: Hypertension, hyperlipidemia, family history of heart disease. Overweight.   ANXIETY/sleep disorder:  Patient reports he is feeling well on Lexapro 20 mg daily and would like to continue. Elavil  low dose started last appt he feels overall it is working well.  He is sleeping better but still wakes approximately 2 times a night.  He does not feel any extra sedation throughout his day from medication.   Gerd: Patient reports he is still requiring PPI daily use in order to avoid symptoms.    11/30/2021    8:30 AM 06/21/2021    8:07 AM 06/10/2021    8:13 AM 07/12/2020    1:33 PM 05/31/2020    8:24 AM  Depression screen PHQ 2/9  Decreased Interest 0 0 0 0 0  Down, Depressed, Hopeless 0 0 0 0 0  PHQ - 2 Score 0 0 0 0 0  Altered sleeping 1      Tired, decreased energy 0      Change in appetite 1      Feeling bad or failure about yourself  0      Trouble concentrating 0      Moving slowly or fidgety/restless 0      Suicidal thoughts 0      PHQ-9 Score 2          11/30/2021    8:32 AM 01/14/2020    8:20 AM 12/16/2019    8:06 AM 06/23/2019    8:30 AM  GAD 7 : Generalized Anxiety Score  Nervous, Anxious, on Edge 0 0 0 0  Control/stop worrying 0 0 0 0  Worry too much - different things 0 0 0 0  Trouble relaxing 0 3 0 0  Restless 0 '3 3 1  '$ Easily annoyed or irritable 0 1 0 0  Afraid - awful might happen 0 0 0  0  Total GAD 7 Score 0 '7 3 1  '$ Anxiety Difficulty  Not difficult at all Somewhat difficult Not difficult at all            09/09/2021    9:24 AM 06/21/2021    8:07 AM 06/10/2021    8:10 AM 07/12/2020    1:33 PM 08/10/2019    3:53 PM  Fall Risk   Falls in the past year? 0 0 0 0 0  Comment     Emmi Telephone Survey: data to providers prior to load  Number falls in past yr:  0 0 0   Injury with Fall?  0 0 0   Follow up  Falls evaluation completed          There is no immunization history on file for this patient.   Past Medical  History:  Diagnosis Date   Arm fracture    left. Dx'd w/RSD post fracture   Carpal tunnel syndrome    Chest pain 2016   [facility] (Dr. Matthew Saras) doing stress echo, but feels like musculoskeletal chest wall pain is cause of his discomfort   Chronic back pain    COVID-19 06/2020   Deviated septum    Diverticulosis    GERD (gastroesophageal reflux disease)    Hyperlipidemia    Hypertension    Joint pain    Muscle pain    Nephrolithiasis    OSA on CPAP 07/04/2018   - Trial of CPAP therapy on 17 cm H2O with a Large size [employer]kel Full Face Mask Simplus mask and heated humidification.   Plantar fasciitis    Pulmonary nodule 04/2016   [place].  Pt chose to repeat CT chest w/out contrast in 3 mo---as per radiologist's recommendations.   Right inguinal hernia    fat-containing.  Small hydrocele in left hemiscrotum.   Snoring    Swelling    of legs/feet/hands   Tobacco dependence in remission    Allergies  Allergen Reactions   Penicillins Hives   Celebrex [Celecoxib]    Cymbalta [Duloxetine Hcl]    Lyrica [Pregabalin]    Past Surgical History:  Procedure Laterality Date   COLONOSCOPY     fracture repair left arm     had a fixator   INGUINAL HERNIA REPAIR Left 2013   repeat hernia repair. x2   KNEE ARTHROSCOPY Right    LUMBAR DISC SURGERY  1993   NEURECTOMY inguinal after hernia repair Left    PLANTAR FASCIA SURGERY Right  2000s   UPPER GI ENDOSCOPY     Family History  Problem Relation Age of Onset   Lung cancer Mother    Lung cancer Father    Bone cancer Brother        bone   Heart disease Maternal Uncle    Colon cancer Neg Hx    Social History   Social History Narrative   Married, Arts administrator. Children (2) Adult, 4 grandchildren.    9 th grade, Retired.   Wears seatbelt   Smoke detector in the home.    Firearms locked in the home.    Feels safe in his relationships.     Allergies as of 05/29/2022       Reactions   Penicillins Hives   Celebrex [celecoxib]    Cymbalta [duloxetine Hcl]    Lyrica [pregabalin]         Medication List        Accurate as of May 29, 2022 11:54 AM. If you have any questions, ask your nurse or doctor.          amitriptyline 25 MG tablet Commonly known as: ELAVIL Take 1 tablet (25 mg total) by mouth at bedtime. What changed:  medication strength how much to take Changed by: Howard Pouch, DO   amLODipine 10 MG tablet Commonly known as: NORVASC Take 1 tablet (10 mg total) by mouth daily.   aspirin 81 MG tablet Take 81 mg by mouth daily.   baclofen 20 MG tablet Commonly known as: LIORESAL Take 0.5-1 tablets (10-20 mg total) by mouth 3 (three) times daily.   diazepam 5 MG tablet Commonly known as: VALIUM 1 tab PO 1 hour before procedure, may repeat in 1 hour if needed Started by: Howard Pouch, DO   escitalopram 20 MG tablet Commonly known as: LEXAPRO Take 1 tablet (20 mg total)  by mouth daily.   gabapentin 600 MG tablet Commonly known as: NEURONTIN Take 1 tablet (600 mg total) by mouth 3 (three) times daily. 1 tab morning, 1.5 tabs afternoon, 1 tab evening,  doses should be every 8 hours.   hydrochlorothiazide 25 MG tablet Commonly known as: HYDRODIURIL Take 1 tablet (25 mg total) by mouth daily.   lisinopril 10 MG tablet Commonly known as: ZESTRIL Take 1 tablet (10 mg total) by mouth daily.   MegaRed Omega-3 Krill Oil 500 MG  Caps Take 2 capsules by mouth daily.   oxyCODONE-acetaminophen 10-325 MG tablet Commonly known as: PERCOCET Take 1 tablet by mouth every 8 (eight) hours as needed for pain.   oxyCODONE-acetaminophen 10-325 MG tablet Commonly known as: PERCOCET Take 1 tablet by mouth every 8 (eight) hours as needed for pain.   oxyCODONE-acetaminophen 10-325 MG tablet Commonly known as: PERCOCET Take 1 tablet by mouth every 8 (eight) hours as needed for pain.   pantoprazole 40 MG tablet Commonly known as: PROTONIX Take 1 tablet (40 mg total) by mouth daily.   simvastatin 10 MG tablet Commonly known as: ZOCOR Take 1 tablet (10 mg total) by mouth every other day.       All past medical history, surgical history, allergies, family history, immunizations andmedications were updated in the EMR today and reviewed under the history and medication portions of their EMR.     No results found for this or any previous visit (from the past 2160 hour(s)).  MR SHOULDER RIGHT WO CONTRAST Result Date: 05/03/2022 IMPRESSION: 1. Postsurgical changes consistent with interval rotator cuff repair and probable bicipital tenodesis with superior labral debridement. Mild supraspinatus and infraspinatus tendinosis without recurrent rotator cuff tear or muscular atrophy. 2. Interval development of prominent subcortical cyst formation and surrounding edema inferiorly in the acromion with associated subacromial-subdeltoid bursitis. Electronically Signed   By: Richardean Sale M.D.   On: 05/03/2022 13:24    ROS 14 pt review of systems performed and negative (unless mentioned in an HPI)  Objective: BP 106/62   Pulse 73   Temp 97.7 F (36.5 C) (Oral)   Ht '6\' 1"'$  (1.854 m)   Wt 205 lb (93 kg)   SpO2 98%   BMI 27.05 kg/m  Physical Exam Constitutional:      General: He is not in acute distress.    Appearance: Normal appearance. He is not ill-appearing, toxic-appearing or diaphoretic.  HENT:     Head: Normocephalic and  atraumatic.     Right Ear: Tympanic membrane, ear canal and external ear normal. There is no impacted cerumen.     Left Ear: Tympanic membrane, ear canal and external ear normal. There is no impacted cerumen.     Nose: Nose normal. No congestion or rhinorrhea.     Mouth/Throat:     Mouth: Mucous membranes are moist.     Pharynx: Oropharynx is clear. No oropharyngeal exudate or posterior oropharyngeal erythema.  Eyes:     General: No scleral icterus.       Right eye: No discharge.        Left eye: No discharge.     Extraocular Movements: Extraocular movements intact.     Pupils: Pupils are equal, round, and reactive to light.  Cardiovascular:     Rate and Rhythm: Normal rate and regular rhythm.     Pulses: Normal pulses.     Heart sounds: Normal heart sounds. No murmur heard.    No friction rub. No gallop.  Pulmonary:  Effort: Pulmonary effort is normal. No respiratory distress.     Breath sounds: Normal breath sounds. No stridor. No wheezing, rhonchi or rales.  Chest:     Chest wall: No tenderness.  Abdominal:     General: Abdomen is flat. Bowel sounds are normal. There is no distension.     Palpations: Abdomen is soft. There is no mass.     Tenderness: There is no abdominal tenderness. There is no right CVA tenderness, left CVA tenderness, guarding or rebound.     Hernia: No hernia is present.  Musculoskeletal:        General: No swelling or tenderness. Normal range of motion.     Cervical back: Normal range of motion and neck supple.     Right lower leg: No edema.     Left lower leg: No edema.  Lymphadenopathy:     Cervical: No cervical adenopathy.  Skin:    General: Skin is warm and dry.     Coloration: Skin is not jaundiced.     Findings: No bruising, lesion or rash.  Neurological:     General: No focal deficit present.     Mental Status: He is alert and oriented to person, place, and time. Mental status is at baseline.     Cranial Nerves: No cranial nerve deficit.      Sensory: No sensory deficit.     Motor: No weakness.     Coordination: Coordination normal.     Gait: Gait normal.     Deep Tendon Reflexes: Reflexes normal.  Psychiatric:        Mood and Affect: Mood normal.        Behavior: Behavior normal.        Thought Content: Thought content normal.        Judgment: Judgment normal.      No results found.  Assessment/plan: Nicholas Graham is a 71 y.o. male present for CPE/CMC (Documentation only) Cervical neck pain with evidence of disc disease/Chronic midline thoracic back pain/Thoracic degenerative disc disease/DDD (degenerative disc disease), lumbar/History of lumbar discectomy He is following with sports medicine, physical therapy and recently had cervical injections-again.  encounter every 3 months for pain management appt only.  - current pain management is helping him maintain activity and improve his quality of life. -[place] controlled substance database reviewed and appropriate 02/15/22  - Pain contract signed > updated 09/12/2021 - UDS > 06/21/2021 completed -Patient discontinued Mobic. -continue gabapentin  600/600/600> did not tolerate higher dose (headache) -continue  baclofen 10-20 mg 3 times daily -continue  percocet 10-325 mg tID #90 prescribed with 2 refills (held) at pharmacy.  -Has appointment with Dr. [name] to further evaluate neck pain with MRI and consideration of cervical surgical procedure. - f/u 3 mos   -------------- Essential hypertension, benign/dyslipidemia/overweight Stable Continue amlodipine 10 mg daily  Continue HCTZ. Continue Zocor 10 mg q. OD - continue fish oil. - continue ASA 81 mg - routine diet and exercise. - CBC - Comprehensive metabolic panel - Lipid panel - TSH F/u q 3 mos with other conditions.    Gastroesophageal reflux disease with esophagitis Stable Continue Protonix PRN   Anxiety/sleep disturbance:  Patient does have anxiety on upcoming cervical injection.  Agreed to  provide him with Valium 5 mg to take 1 hour prior to procedure. A controlled substance database reviewed and appropriate today. Decrease Elavil to 50 mg nightly> 25 mg nightly.  Patient states the higher dose caused him to have headaches. Continue  lexapro 20 mg qd.   OSA on CPAP compliant Encounter for long-term current use of medication - Hemoglobin A1c Prostate cancer screening - PSA Need for immunization against influenza Declined  Routine general medical examination at a health care facility Colonoscopy: last screen 06/2016, recommend follow up 5 years; resulted polyps present. Completed by Dr. Havery Moros. Postponed d/t shoulder pain. Immunizations:  tdap declined, influenza declined, PNA series declined, zostavax declined Infectious disease screening: Hep C completed. If not completed prior, screening test offered. PSA: Collected today Patient was encouraged to exercise greater than 150 minutes a week. Patient was encouraged to choose a diet filled with fresh fruits and vegetables, and lean meats. AVS provided to patient today for education/recommendation on gender specific health and safety maintenance.  Return in about 11 weeks (around 08/14/2022) for Routine chronic condition follow-up.   Orders Placed This Encounter  Procedures   CBC   Comprehensive metabolic panel   Hemoglobin A1c   Lipid panel   TSH   PSA   Meds ordered this encounter  Medications   diazepam (VALIUM) 5 MG tablet    Sig: 1 tab PO 1 hour before procedure, may repeat in 1 hour if needed    Dispense:  2 tablet    Refill:  0   amitriptyline (ELAVIL) 25 MG tablet    Sig: Take 1 tablet (25 mg total) by mouth at bedtime.    Dispense:  90 tablet    Refill:  1    DC higher dose   amLODipine (NORVASC) 10 MG tablet    Sig: Take 1 tablet (10 mg total) by mouth daily.    Dispense:  90 tablet    Refill:  1   baclofen (LIORESAL) 20 MG tablet    Sig: Take 0.5-1 tablets (10-20 mg total) by mouth 3 (three)  times daily.    Dispense:  270 tablet    Refill:  1   escitalopram (LEXAPRO) 20 MG tablet    Sig: Take 1 tablet (20 mg total) by mouth daily.    Dispense:  90 tablet    Refill:  1    This prescription was filled on 05/12/2019. Any refills authorized will be placed on file.   gabapentin (NEURONTIN) 600 MG tablet    Sig: Take 1 tablet (600 mg total) by mouth 3 (three) times daily. 1 tab morning, 1.5 tabs afternoon, 1 tab evening,  doses should be every 8 hours.    Dispense:  270 tablet    Refill:  1    Pt reports not true allergy- was a combination of meds that caused symptoms.   hydrochlorothiazide (HYDRODIURIL) 25 MG tablet    Sig: Take 1 tablet (25 mg total) by mouth daily.    Dispense:  90 tablet    Refill:  1   lisinopril (ZESTRIL) 10 MG tablet    Sig: Take 1 tablet (10 mg total) by mouth daily.    Dispense:  90 tablet    Refill:  1   pantoprazole (PROTONIX) 40 MG tablet    Sig: Take 1 tablet (40 mg total) by mouth daily.    Dispense:  90 tablet    Refill:  3    This prescription was filled on 06/19/2019. Any refills authorized will be placed on file.   simvastatin (ZOCOR) 10 MG tablet    Sig: Take 1 tablet (10 mg total) by mouth every other day.    Dispense:  45 tablet    Refill:  1   Referral  Orders  No referral(s) requested today     Note is dictated utilizing voice recognition software. Although note has been proof read prior to signing, occasional typographical errors still can be missed. If any questions arise, please do not hesitate to call for verification.  Electronically signed by: Howard Pouch, DO [place]

## 2022-05-29 NOTE — Patient Instructions (Signed)
Return in about 11 weeks (around 08/14/2022) for Routine chronic condition follow-up.        Great to see you today.  I have refilled the medication(s) we provide.   If labs were collected, we will inform you of lab results once received either by echart message or telephone call.   - echart message- for normal results that have been seen by the patient already.   - telephone call: abnormal results or if patient has not viewed results in their echart.   Health Maintenance After Age 92 After age 68, you are at a higher risk for certain long-term diseases and infections as well as injuries from falls. Falls are a major cause of broken bones and head injuries in people who are older than age 57. Getting regular preventive care can help to keep you healthy and well. Preventive care includes getting regular testing and making lifestyle changes as recommended by your health care provider. Talk with your health care provider about: Which screenings and tests you should have. A screening is a test that checks for a disease when you have no symptoms. A diet and exercise plan that is right for you. What should I know about screenings and tests to prevent falls? Screening and testing are the best ways to find a health problem early. Early diagnosis and treatment give you the best chance of managing medical conditions that are common after age 11. Certain conditions and lifestyle choices may make you more likely to have a fall. Your health care provider may recommend: Regular vision checks. Poor vision and conditions such as cataracts can make you more likely to have a fall. If you wear glasses, make sure to get your prescription updated if your vision changes. Medicine review. Work with your health care provider to regularly review all of the medicines you are taking, including over-the-counter medicines. Ask your health care provider about any side effects that may make you more likely to have a fall.  Tell your health care provider if any medicines that you take make you feel dizzy or sleepy. Strength and balance checks. Your health care provider may recommend certain tests to check your strength and balance while standing, walking, or changing positions. Foot health exam. Foot pain and numbness, as well as not wearing proper footwear, can make you more likely to have a fall. Screenings, including: Osteoporosis screening. Osteoporosis is a condition that causes the bones to get weaker and break more easily. Blood pressure screening. Blood pressure changes and medicines to control blood pressure can make you feel dizzy. Depression screening. You may be more likely to have a fall if you have a fear of falling, feel depressed, or feel unable to do activities that you used to do. Alcohol use screening. Using too much alcohol can affect your balance and may make you more likely to have a fall. Follow these instructions at home: Lifestyle Do not drink alcohol if: Your health care provider tells you not to drink. If you drink alcohol: Limit how much you have to: 0-1 drink a day for women. 0-2 drinks a day for men. Know how much alcohol is in your drink. In the [place] one drink equals one 12 oz bottle of beer (355 mL), one 5 oz glass of wine (148 mL), or one 1 oz glass of hard liquor (44 mL). Do not use any products that contain nicotine or tobacco. These products include cigarettes, chewing tobacco, and vaping devices, such as e-cigarettes. If you need help  quitting, ask your health care provider. Activity  Follow a regular exercise program to stay fit. This will help you maintain your balance. Ask your health care provider what types of exercise are appropriate for you. If you need a cane or walker, use it as recommended by your health care provider. Wear supportive shoes that have nonskid soles. Safety  Remove any tripping hazards, such as rugs, cords, and clutter. Install safety equipment  such as grab bars in bathrooms and safety rails on stairs. Keep rooms and walkways well-lit. General instructions Talk with your health care provider about your risks for falling. Tell your health care provider if: You fall. Be sure to tell your health care provider about all falls, even ones that seem minor. You feel dizzy, tiredness (fatigue), or off-balance. Take over-the-counter and prescription medicines only as told by your health care provider. These include supplements. Eat a healthy diet and maintain a healthy weight. A healthy diet includes low-fat dairy products, low-fat (lean) meats, and fiber from whole grains, beans, and lots of fruits and vegetables. Stay current with your vaccines. Schedule regular health, dental, and eye exams. Summary Having a healthy lifestyle and getting preventive care can help to protect your health and wellness after age 46. Screening and testing are the best way to find a health problem early and help you avoid having a fall. Early diagnosis and treatment give you the best chance for managing medical conditions that are more common for people who are older than age 56. Falls are a major cause of broken bones and head injuries in people who are older than age 82. Take precautions to prevent a fall at home. Work with your health care provider to learn what changes you can make to improve your health and wellness and to prevent falls. This information is not intended to replace advice given to you by your health care provider. Make sure you discuss any questions you have with your health care provider. Document Revised: 01/23/2021 Document Reviewed: 01/23/2021 Elsevier Patient Education  [place].

## 2022-05-30 DIAGNOSIS — M5412 Radiculopathy, cervical region: Secondary | ICD-10-CM | POA: Diagnosis not present

## 2022-06-12 DIAGNOSIS — H2513 Age-related nuclear cataract, bilateral: Secondary | ICD-10-CM | POA: Diagnosis not present

## 2022-06-15 ENCOUNTER — Ambulatory Visit (INDEPENDENT_AMBULATORY_CARE_PROVIDER_SITE_OTHER): Payer: PPO

## 2022-06-15 DIAGNOSIS — Z0000 Encounter for general adult medical examination without abnormal findings: Secondary | ICD-10-CM | POA: Diagnosis not present

## 2022-06-15 NOTE — Patient Instructions (Signed)
Health Maintenance, Male Adopting a healthy lifestyle and getting preventive care are important in promoting health and wellness. Ask your health care provider about: The right schedule for you to have regular tests and exams. Things you can do on your own to prevent diseases and keep yourself healthy. What should I know about diet, weight, and exercise? Eat a healthy diet  Eat a diet that includes plenty of vegetables, fruits, low-fat dairy products, and lean protein. Do not eat a lot of foods that are high in solid fats, added sugars, or sodium. Maintain a healthy weight Body mass index (BMI) is a measurement that can be used to identify possible weight problems. It estimates body fat based on height and weight. Your health care provider can help determine your BMI and help you achieve or maintain a healthy weight. Get regular exercise Get regular exercise. This is one of the most important things you can do for your health. Most adults should: Exercise for at least 150 minutes each week. The exercise should increase your heart rate and make you sweat (moderate-intensity exercise). Do strengthening exercises at least twice a week. This is in addition to the moderate-intensity exercise. Spend less time sitting. Even light physical activity can be beneficial. Watch cholesterol and blood lipids Have your blood tested for lipids and cholesterol at 69 years of age, then have this test every 5 years. You may need to have your cholesterol levels checked more often if: Your lipid or cholesterol levels are high. You are older than 69 years of age. You are at high risk for heart disease. What should I know about cancer screening? Many types of cancers can be detected early and may often be prevented. Depending on your health history and family history, you may need to have cancer screening at various ages. This may include screening for: Colorectal cancer. Prostate cancer. Skin cancer. Lung  cancer. What should I know about heart disease, diabetes, and high blood pressure? Blood pressure and heart disease High blood pressure causes heart disease and increases the risk of stroke. This is more likely to develop in people who have high blood pressure readings or are overweight. Talk with your health care provider about your target blood pressure readings. Have your blood pressure checked: Every 3-5 years if you are 18-39 years of age. Every year if you are 40 years old or older. If you are between the ages of 65 and 75 and are a current or former smoker, ask your health care provider if you should have a one-time screening for abdominal aortic aneurysm (AAA). Diabetes Have regular diabetes screenings. This checks your fasting blood sugar level. Have the screening done: Once every three years after age 45 if you are at a normal weight and have a low risk for diabetes. More often and at a younger age if you are overweight or have a high risk for diabetes. What should I know about preventing infection? Hepatitis B If you have a higher risk for hepatitis B, you should be screened for this virus. Talk with your health care provider to find out if you are at risk for hepatitis B infection. Hepatitis C Blood testing is recommended for: Everyone born from 1945 through 1965. Anyone with known risk factors for hepatitis C. Sexually transmitted infections (STIs) You should be screened each year for STIs, including gonorrhea and chlamydia, if: You are sexually active and are younger than 69 years of age. You are older than 69 years of age and your   health care provider tells you that you are at risk for this type of infection. Your sexual activity has changed since you were last screened, and you are at increased risk for chlamydia or gonorrhea. Ask your health care provider if you are at risk. Ask your health care provider about whether you are at high risk for HIV. Your health care provider  may recommend a prescription medicine to help prevent HIV infection. If you choose to take medicine to prevent HIV, you should first get tested for HIV. You should then be tested every 3 months for as long as you are taking the medicine. Follow these instructions at home: Alcohol use Do not drink alcohol if your health care provider tells you not to drink. If you drink alcohol: Limit how much you have to 0-2 drinks a day. Know how much alcohol is in your drink. In the [place] one drink equals one 12 oz bottle of beer (355 mL), one 5 oz glass of wine (148 mL), or one 1 oz glass of hard liquor (44 mL). Lifestyle Do not use any products that contain nicotine or tobacco. These products include cigarettes, chewing tobacco, and vaping devices, such as e-cigarettes. If you need help quitting, ask your health care provider. Do not use street drugs. Do not share needles. Ask your health care provider for help if you need support or information about quitting drugs. General instructions Schedule regular health, dental, and eye exams. Stay current with your vaccines. Tell your health care provider if: You often feel depressed. You have ever been abused or do not feel safe at home. Summary Adopting a healthy lifestyle and getting preventive care are important in promoting health and wellness. Follow your health care provider's instructions about healthy diet, exercising, and getting tested or screened for diseases. Follow your health care provider's instructions on monitoring your cholesterol and blood pressure. This information is not intended to replace advice given to you by your health care provider. Make sure you discuss any questions you have with your health care provider. Document Revised: 01/23/2021 Document Reviewed: 01/23/2021 Elsevier Patient Education  2023 [employer]  

## 2022-06-15 NOTE — Progress Notes (Signed)
Subjective:   Nicholas Graham is a 71 y.o. male who presents for Medicare Annual/Subsequent preventive examination.   I connected with  Nicholas Graham on 06/15/22 by an audio only telemedicine application and verified that I am speaking with the correct person using two identifiers.   I discussed the limitations, risks, security and privacy concerns of performing an evaluation and management service by telephone and the availability of in person appointments. I also discussed with the patient that there may be a patient responsible charge related to this service. The patient expressed understanding and verbally consented to this telephonic visit.  Location of Patient: home Location of Provider: office  List any persons and their role that are participating in the visit with the patient.   [place], CMA  Review of Systems    Defer to PCP Cardiac Risk Factors include: advanced age (>69mn, >>36women);male gender     Objective:    Today's Vitals   06/15/22 1305  PainSc: 6    There is no height or weight on file to calculate BMI.     06/15/2022    1:10 PM 06/10/2021    8:33 AM 06/26/2018    8:57 PM  Advanced Directives  Does Patient Have a Medical Advance Directive? No No No  Would patient like information on creating a medical advance directive? No - Patient declined No - Patient declined No - Patient declined    Current Medications (verified) Outpatient Encounter Medications as of 06/15/2022  Medication Sig   amitriptyline (ELAVIL) 25 MG tablet Take 1 tablet (25 mg total) by mouth at bedtime.   amLODipine (NORVASC) 10 MG tablet Take 1 tablet (10 mg total) by mouth daily.   aspirin 81 MG tablet Take 81 mg by mouth daily.   baclofen (LIORESAL) 20 MG tablet Take 0.5-1 tablets (10-20 mg total) by mouth 3 (three) times daily.   diazepam (VALIUM) 5 MG tablet 1 tab PO 1 hour before procedure, may repeat in 1 hour if needed   escitalopram (LEXAPRO) 20 MG  tablet Take 1 tablet (20 mg total) by mouth daily.   gabapentin (NEURONTIN) 600 MG tablet Take 1 tablet (600 mg total) by mouth 3 (three) times daily. 1 tab morning, 1.5 tabs afternoon, 1 tab evening,  doses should be every 8 hours.   hydrochlorothiazide (HYDRODIURIL) 25 MG tablet Take 1 tablet (25 mg total) by mouth daily.   lisinopril (ZESTRIL) 10 MG tablet Take 1 tablet (10 mg total) by mouth daily.   MegaRed Omega-3 Krill Oil 500 MG CAPS Take 2 capsules by mouth daily.    oxyCODONE-acetaminophen (PERCOCET) 10-325 MG tablet Take 1 tablet by mouth every 8 (eight) hours as needed for pain.   oxyCODONE-acetaminophen (PERCOCET) 10-325 MG tablet Take 1 tablet by mouth every 8 (eight) hours as needed for pain.   oxyCODONE-acetaminophen (PERCOCET) 10-325 MG tablet Take 1 tablet by mouth every 8 (eight) hours as needed for pain.   pantoprazole (PROTONIX) 40 MG tablet Take 1 tablet (40 mg total) by mouth daily.   simvastatin (ZOCOR) 10 MG tablet Take 1 tablet (10 mg total) by mouth every other day.   No facility-administered encounter medications on file as of 06/15/2022.    Allergies (verified) Penicillins, Celebrex [celecoxib], Cymbalta [duloxetine hcl], and Lyrica [pregabalin]   History: Past Medical History:  Diagnosis Date   Arm fracture    left. Dx'd w/RSD post fracture   Carpal tunnel syndrome    Chest pain 2016  [facility] (Dr. Matthew Saras) doing stress echo, but feels like musculoskeletal chest wall pain is cause of his discomfort   Chronic back pain    COVID-19 06/2020   Deviated septum    Diverticulosis    GERD (gastroesophageal reflux disease)    Hyperlipidemia    Hypertension    Joint pain    Muscle pain    Nephrolithiasis    OSA on CPAP 07/04/2018   - Trial of CPAP therapy on 17 cm H2O with a Large size Fisher&Paykel Full Face Mask Simplus mask and heated humidification.   Plantar fasciitis    Pulmonary nodule 04/2016   Lingula.  Pt chose to repeat CT chest w/out  contrast in 3 mo---as per radiologist's recommendations.   Right inguinal hernia    fat-containing.  Small hydrocele in left hemiscrotum.   Snoring    Swelling    of legs/feet/hands   Tobacco dependence in remission    Past Surgical History:  Procedure Laterality Date   COLONOSCOPY     fracture repair left arm     had a fixator   INGUINAL HERNIA REPAIR Left 2013   repeat hernia repair. x2   KNEE ARTHROSCOPY Right    LUMBAR DISC SURGERY  1993   NEURECTOMY inguinal after hernia repair Left    PLANTAR FASCIA SURGERY Right 2000s   UPPER GI ENDOSCOPY     Family History  Problem Relation Age of Onset   Lung cancer Mother    Lung cancer Father    Bone cancer Brother        bone   Heart disease Maternal Uncle    Colon cancer Neg Hx    Social History   Socioeconomic History   Marital status: Married    Spouse name: Not on file   Number of children: 2   Years of education: Not on file   Highest education level: 10th grade  Occupational History   Not on file  Tobacco Use   Smoking status: Former    Packs/day: 3.00    Years: 30.00    Total pack years: 90.00    Types: Cigarettes    Quit date: 07/16/2002    Years since quitting: 19.9   Smokeless tobacco: Never  Vaping Use   Vaping Use: Never used  Substance and Sexual Activity   Alcohol use: Yes    Alcohol/week: 1.0 - 2.0 standard drink of alcohol    Types: 1 - 2 Cans of beer per week    Comment: socially   Drug use: No   Sexual activity: Yes  Other Topics Concern   Not on file  Social History Narrative   Married, [name]. Children (2) Adult, 4 grandchildren.    9 th grade, Retired.   Wears seatbelt   Smoke detector in the home.    Firearms locked in the home.    Feels safe in his relationships.    Social Determinants of Health   Financial Resource Strain: Low Risk  (06/15/2022)   Overall Financial Resource Strain (CARDIA)    Difficulty of Paying Living Expenses: Not hard at all  Food Insecurity: No Food  Insecurity (06/15/2022)   Hunger Vital Sign    Worried About Running Out of Food in the Last Year: Never true    Ran Out of Food in the Last Year: Never true  Transportation Needs: No Transportation Needs (06/15/2022)   PRAPARE - Hydrologist (Medical): No    Lack of Transportation (Non-Medical): No  Physical Activity: Sufficiently Active (06/15/2022)   Exercise Vital Sign    Days of Exercise per Week: 7 days    Minutes of Exercise per Session: 70 min  Stress: No Stress Concern Present (06/15/2022)   [place]    Feeling of Stress : Not at all  Social Connections: Moderately Isolated (06/15/2022)   Social Connection and Isolation Panel [NHANES]    Frequency of Communication with Friends and Family: More than three times a week    Frequency of Social Gatherings with Friends and Family: Twice a week    Attends Religious Services: Never    Marine scientist or Organizations: No    Attends Music therapist: Never    Marital Status: Married    Tobacco Counseling Counseling given: Not Answered   Clinical Intake:  Pre-visit preparation completed: No  Pain : 0-10 Pain Score: 6  Pain Location: Shoulder Pain Orientation: Right     Nutritional Risks: None Diabetes: No  How often do you need to have someone help you when you read instructions, pamphlets, or other written materials from your doctor or pharmacy?: 1 - Never What is the last grade level you completed in school?: 9th  Diabetic?no  Interpreter Needed?: No      Activities of Daily Living    06/15/2022    1:06 PM  In your present state of health, do you have any difficulty performing the following activities:  Hearing? 0  Vision? 0  Difficulty concentrating or making decisions? 0  Walking or climbing stairs? 0  Dressing or bathing? 0  Doing errands, shopping? 0  Preparing Food and eating ? N  Using  the Toilet? N  In the past six months, have you accidently leaked urine? N  Do you have problems with loss of bowel control? N  Managing your Medications? N  Managing your Finances? N  Housekeeping or managing your Housekeeping? N    Patient Care Team: [name], DO as PCP - General (Family Medicine) [name], MD (Inactive) as Consulting Physician (Gastroenterology) [name], MD as Consulting Physician (Orthopedic Surgery)  Indicate any recent Medical Services you may have received from other than [facility] providers in the past year (date may be approximate).     Assessment:   This is a routine wellness examination for Nicholas Graham.  Hearing/Vision screen No results found.  Dietary issues and exercise activities discussed: Current Exercise Habits: Home exercise routine, Type of exercise: walking, Time (Minutes): 60, Frequency (Times/Week): 7, Weekly Exercise (Minutes/Week): 420   Goals Addressed   None   Depression Screen    06/15/2022    1:06 PM 11/30/2021    8:30 AM 06/21/2021    8:07 AM 06/10/2021    8:13 AM 07/12/2020    1:33 PM 05/31/2020    8:24 AM 12/16/2019    8:07 AM  PHQ 2/9 Scores  PHQ - 2 Score 0 0 0 0 0 0 0  PHQ- 9 Score  2         Fall Risk    06/15/2022    1:06 PM 09/09/2021    9:24 AM 06/21/2021    8:07 AM 06/10/2021    8:10 AM 07/12/2020    1:33 PM  [place] in the past year? 0 0 0 0 0  Number falls in past yr: 0  0 0 0  Injury with Fall? 0  0 0 0  Risk for  fall due to : No Fall Risks      Follow up Falls evaluation completed  Falls evaluation completed      [place]:  Any stairs in or around the home? Yes  If so, are there any without handrails? No  Home free of loose throw rugs in walkways, pet beds, electrical cords, etc? Yes  Adequate lighting in your home to reduce risk of falls? Yes   ASSISTIVE DEVICES UTILIZED TO PREVENT FALLS:  Life alert? No  Use of a cane, walker or w/c? No  Grab  bars in the bathroom? Yes  Shower chair or bench in shower? Yes  Elevated toilet seat or a handicapped toilet? No   TIMED UP AND GO:  Was the test performed? No .  Length of time to ambulate 10 feet: n/a sec.     Cognitive Function:        06/15/2022    1:10 PM 06/10/2021    8:34 AM  6CIT Screen  What Year? 0 points 0 points  What month? 0 points 0 points  What time? 0 points 3 points  Count back from 20 0 points 0 points  Months in reverse  4 points  Repeat phrase 0 points 10 points  Total Score  17 points    Immunizations  There is no immunization history on file for this patient.  TDAP status: Up to date  Flu Vaccine status: Declined, Education has been provided regarding the importance of this vaccine but patient still declined. Advised may receive this vaccine at local pharmacy or Health Dept. Aware to provide a copy of the vaccination record if obtained from local pharmacy or Health Dept. Verbalized acceptance and understanding.  Pneumococcal vaccine status: Declined,  Education has been provided regarding the importance of this vaccine but patient still declined. Advised may receive this vaccine at local pharmacy or Health Dept. Aware to provide a copy of the vaccination record if obtained from local pharmacy or Health Dept. Verbalized acceptance and understanding.   Covid-19 vaccine status: Information provided on how to obtain vaccines.   Qualifies for Shingles Vaccine? Yes   Zostavax completed No   Shingrix Completed?: No.    Education has been provided regarding the importance of this vaccine. Patient has been advised to call insurance company to determine out of pocket expense if they have not yet received this vaccine. Advised may also receive vaccine at local pharmacy or Health Dept. Verbalized acceptance and understanding.  Screening Tests Health Maintenance  Topic Date Due   COLONOSCOPY (Pts 45-21yr Insurance coverage will need to be confirmed)   06/21/2022 (Originally 06/18/2021)   Zoster Vaccines- Shingrix (1 of 2) 08/28/2022 (Originally 07/14/1970)   INFLUENZA VACCINE  12/16/2022 (Originally 04/17/2022)   Pneumonia Vaccine 69 Years old (1 - PCV) 05/30/2023 (Originally 07/14/2018)   TETANUS/TDAP  05/30/2023 (Originally 07/14/1970)   Hepatitis C Screening  Completed   HPV VACCINES  Aged Out   COVID-19 Vaccine  Discontinued    Health Maintenance  There are no preventive care reminders to display for this patient.  Colorectal cancer screening: Type of screening: Colonoscopy. Completed 06/18/2016. Repeat every 5 years  Lung Cancer Screening: (Low Dose CT Chest recommended if Age 259-80years, 30 pack-year currently smoking OR have quit w/in 15years.) does not qualify.   Lung Cancer Screening Referral: n/a  Additional Screening:  Hepatitis C Screening: does qualify; Completed 05/31/20  Vision Screening: Recommended annual ophthalmology exams for early detection of glaucoma and  other disorders of the eye. Is the patient up to date with their annual eye exam?  Yes  Who is the provider or what is the name of the office in which the patient attends annual eye exams? [place] If pt is not established with a provider, would they like to be referred to a provider to establish care? No .   Dental Screening: Recommended annual dental exams for proper oral hygiene  Community Resource Referral / Chronic Care Management: CRR required this visit?  No   CCM required this visit?  No      Plan:     I have personally reviewed and noted the following in the patient's chart:   Medical and social history Use of alcohol, tobacco or illicit drugs  Current medications and supplements including opioid prescriptions. Patient is currently taking opioid prescriptions. Information provided to patient regarding non-opioid alternatives. Patient advised to discuss non-opioid treatment plan with their provider. Functional ability  and status Nutritional status Physical activity Advanced directives List of other physicians Hospitalizations, surgeries, and ER visits in previous 12 months Vitals Screenings to include cognitive, depression, and falls Referrals and appointments  In addition, I have reviewed and discussed with patient certain preventive protocols, quality metrics, and best practice recommendations. A written personalized care plan for preventive services as well as general preventive health recommendations were provided to patient.     Beatrix Fetters, [place]   06/15/2022   Nurse Notes: Non-Face to Face or Face to Face 8 minute visit Encounter   Nicholas Graham , Thank you for taking time to come for your Medicare Wellness Visit. I appreciate your ongoing commitment to your health goals. Please review the following plan we discussed and let me know if I can assist you in the future.   These are the goals we discussed:  Goals   None     This is a list of the screening recommended for you and due dates:  Health Maintenance  Topic Date Due   Colon Cancer Screening  06/21/2022*   Zoster (Shingles) Vaccine (1 of 2) 08/28/2022*   Flu Shot  12/16/2022*   Pneumonia Vaccine (1 - PCV) 05/30/2023*   Tetanus Vaccine  05/30/2023*   Hepatitis C Screening: USPSTF Recommendation to screen - Ages 18-79 yo.  Completed   HPV Vaccine  Aged Out   COVID-19 Vaccine  Discontinued  *Topic was postponed. The date shown is not the original due date.

## 2022-06-26 ENCOUNTER — Other Ambulatory Visit: Payer: Self-pay | Admitting: Family Medicine

## 2022-06-26 NOTE — Telephone Encounter (Signed)
Patient has 2 pills left of his Oxycodone. He states he cant go without his pain meds.  I told patient I completely understand. Patient aware Dr. Raoul Pitch out of office until Friday 10/13. Will Dr. Anitra Lauth call enough meds until Dr. Lucita Lora return on Friday? Or possible Monday in case she doesn't get to his refill request on Friday?  Please advise [phone]

## 2022-06-29 MED ORDER — OXYCODONE-ACETAMINOPHEN 10-325 MG PO TABS: 1.0000 | ORAL_TABLET | Freq: Three times a day (TID) | ORAL | 0 refills | Status: DC | PRN

## 2022-06-29 NOTE — Telephone Encounter (Signed)
Meds refilled for him

## 2022-08-08 ENCOUNTER — Encounter: Payer: Self-pay | Admitting: Gastroenterology

## 2022-08-14 ENCOUNTER — Ambulatory Visit (INDEPENDENT_AMBULATORY_CARE_PROVIDER_SITE_OTHER): Payer: PPO | Admitting: Family Medicine

## 2022-08-14 ENCOUNTER — Encounter: Payer: Self-pay | Admitting: Family Medicine

## 2022-08-14 DIAGNOSIS — M5090 Cervical disc disorder, unspecified, unspecified cervical region: Secondary | ICD-10-CM

## 2022-08-14 DIAGNOSIS — K2100 Gastro-esophageal reflux disease with esophagitis, without bleeding: Secondary | ICD-10-CM

## 2022-08-14 DIAGNOSIS — I10 Essential (primary) hypertension: Secondary | ICD-10-CM | POA: Diagnosis not present

## 2022-08-14 DIAGNOSIS — Z9889 Other specified postprocedural states: Secondary | ICD-10-CM

## 2022-08-14 DIAGNOSIS — M5134 Other intervertebral disc degeneration, thoracic region: Secondary | ICD-10-CM | POA: Diagnosis not present

## 2022-08-14 DIAGNOSIS — M546 Pain in thoracic spine: Secondary | ICD-10-CM

## 2022-08-14 DIAGNOSIS — E785 Hyperlipidemia, unspecified: Secondary | ICD-10-CM | POA: Diagnosis not present

## 2022-08-14 DIAGNOSIS — G8929 Other chronic pain: Secondary | ICD-10-CM | POA: Diagnosis not present

## 2022-08-14 DIAGNOSIS — M5136 Other intervertebral disc degeneration, lumbar region: Secondary | ICD-10-CM | POA: Diagnosis not present

## 2022-08-14 MED ORDER — DIAZEPAM 5 MG PO TABS: ORAL_TABLET | ORAL | 0 refills | Status: DC

## 2022-08-14 MED ORDER — OXYCODONE-ACETAMINOPHEN 10-325 MG PO TABS: 1.0000 | ORAL_TABLET | Freq: Three times a day (TID) | ORAL | 0 refills | Status: DC | PRN

## 2022-08-14 MED ORDER — BACLOFEN 20 MG PO TABS: 10.0000 mg | ORAL_TABLET | Freq: Three times a day (TID) | ORAL | 1 refills | Status: DC

## 2022-08-14 MED ORDER — AMLODIPINE BESYLATE 10 MG PO TABS: 10.0000 mg | ORAL_TABLET | Freq: Every day | ORAL | 1 refills | Status: DC

## 2022-08-14 MED ORDER — LISINOPRIL 10 MG PO TABS: 10.0000 mg | ORAL_TABLET | Freq: Every day | ORAL | 1 refills | Status: DC

## 2022-08-14 MED ORDER — GABAPENTIN 600 MG PO TABS: 600.0000 mg | ORAL_TABLET | Freq: Three times a day (TID) | ORAL | 1 refills | Status: DC

## 2022-08-14 MED ORDER — HYDROCHLOROTHIAZIDE 25 MG PO TABS: 25.0000 mg | ORAL_TABLET | Freq: Every day | ORAL | 1 refills | Status: DC

## 2022-08-14 MED ORDER — PANTOPRAZOLE SODIUM 40 MG PO TBEC: 40.0000 mg | DELAYED_RELEASE_TABLET | Freq: Every day | ORAL | 3 refills | Status: DC

## 2022-08-14 MED ORDER — SIMVASTATIN 10 MG PO TABS: 10.0000 mg | ORAL_TABLET | ORAL | 1 refills | Status: DC

## 2022-08-14 NOTE — Progress Notes (Signed)
Patient ID: Nicholas Graham, male  DOB: 09/04/1951, 71 y.o.   MRN: [id number] Patient Care Team    Relationship Specialty Notifications Start End  [name], DO PCP - General Family Medicine  10/31/15   [name], MD (Inactive) Consulting Physician Gastroenterology  04/24/16   [name], MD Consulting Physician Orthopedic Surgery  10/26/21     Chief Complaint  Patient presents with   Hypertension    Subjective:  Nicholas Graham is a 71 y.o. male present for Chronic medical condition appointment All past medical history, surgical history, allergies, family history, immunizations, medications and social history were updated in the electronic medical record today. All recent labs, ED visits and hospitalizations within the last year were reviewed.   Cervical neck pain with evidence of disc disease/Chronic midline thoracic back pain/Thoracic degenerative disc disease/DDD (degenerative disc disease), lumbar/History of lumbar discectomy Pt presents for his chronic pain management treatment for his cervical and lumbar conditions. His discomfort has progressed since 2020-he feels he has some  relief from current regimen. He has been evaluated  by neuro and pain rehab.  He has received injections- that have only provided a few days of temporary relief in the past . He is frustrated, but has appropriate expectations that he will be living with some pain.  He reports he is maintaining a quality of life and able to remain active with Percocet 10-325 (1 tab every 8-12 hours), baclofen and gabapentin. 600/600/600 (had headaches at increased doses). He states he is walking a few miles a day.   He is established with  Dr. [name] to discuss MRI and surgical procedure for his neck pain. Indication for chronic opioid: Cervical spine degeneration, chronic neck and back pain. Medication and dose: Oxycodone-acetaminophen 10-325 1 tab TID prn daily # pills per month: 60 Last UDS date:UYD  06/2021 Pain contract signed (Y/N): Yes- UTD 08/2021 Date narcotic database last reviewed (/include red flags): 02/15/22 03/08/2021  had epidural injection of cervical spine and he feels it is helpful.  11/2021-has had repeat injections that have not been as helpful. Original note: Lower cervical to mid thoracic pain still present. Using his right arm exacerbates his pain midline of thoracic.  He has a history thoracic spine degeneration and discectomy in his lumbar spine.  Has degenerative changes, bone spurring and facet arthropathy in his lumbar spine by x-rays obtained 11/2018. Cervical spine x-ray with mild bilateral foraminal narrowing at C3-C4 11/2018. Unfortunately he was reported as allergic or intolerant to many of the medications that would be helpful for him such as gabapentin, Lyrica, Celebrex and Cymbalta --> we discussed this in more detail last visit and he believes that the "allergy-hallucinations " was secondary to having multiple of these medicines on board at the same time back in 2014. We tried to start gabapentin and it did not have any side effects from medication. It has not been extremely helpful  Yet at lower doses. He admits to rather sigmnificant stomach upset starting from the naproxen BID use ober the last 8 week. Advanced imaging has not bee able to be completed 2/2 to covid outbreak. He desired to wait on referral to specialist last visit in hope that he would receive benefit from the gabapentin.   Hypertension/HLD/overweight: Pt reports compliance with amlodipine 10 mg daily, lisinopril 10 mg QD and HCTZ. Patient denies chest pain, shortness of breath, dizziness or lower extremity edema.  extremity edema.  Pt takes a daily baby ASA.  Pt is  prescribed statin.  Labs UTD Diet: Low-sodium Exercise: Not exercising as routinely. RF: Hypertension, hyperlipidemia, family history of heart disease. Overweight.   ANXIETY/sleep disorder:  Patient reports he is feeling well on  Lexapro 20 mg daily and would like to continue. Elavil  low dose started last appt he feels overall it is working well.  He does not feel any extra sedation throughout his day from medication.   Gerd: Patient reports he is still requiring PPI daily use in order to avoid symptoms.    06/15/2022    1:06 PM 11/30/2021    8:30 AM 06/21/2021    8:07 AM 06/10/2021    8:13 AM 07/12/2020    1:33 PM  Depression screen PHQ 2/9  Decreased Interest 0 0 0 0 0  Down, Depressed, Hopeless 0 0 0 0 0  PHQ - 2 Score 0 0 0 0 0  Altered sleeping  1     Tired, decreased energy  0     Change in appetite  1     Feeling bad or failure about yourself   0     Trouble concentrating  0     Moving slowly or fidgety/restless  0     Suicidal thoughts  0     PHQ-9 Score  2         11/30/2021    8:32 AM 01/14/2020    8:20 AM 12/16/2019    8:06 AM 06/23/2019    8:30 AM  GAD 7 : Generalized Anxiety Score  Nervous, Anxious, on Edge 0 0 0 0  Control/stop worrying 0 0 0 0  Worry too much - different things 0 0 0 0  Trouble relaxing 0 3 0 0  Restless 0 '3 3 1  '$ Easily annoyed or irritable 0 1 0 0  Afraid - awful might happen 0 0 0 0  Total GAD 7 Score 0 '7 3 1  '$ Anxiety Difficulty  Not difficult at all Somewhat difficult Not difficult at all            06/15/2022    1:06 PM 09/09/2021    9:24 AM 06/21/2021    8:07 AM 06/10/2021    8:10 AM 07/12/2020    1:33 PM  Fall Risk   Falls in the past year? 0 0 0 0 0  Number falls in past yr: 0  0 0 0  Injury with Fall? 0  0 0 0  Risk for fall due to : No Fall Risks      Follow up Falls evaluation completed  Falls evaluation completed       There is no immunization history on file for this patient.   Past Medical History:  Diagnosis Date   Arm fracture    left. Dx'd w/RSD post fracture   Carpal tunnel syndrome    Chest pain 2016   [facility] (Dr. Matthew Saras) doing stress echo, but feels like musculoskeletal chest wall pain is cause of his discomfort    Chronic back pain    COVID-19 06/2020   Deviated septum    Diverticulosis    GERD (gastroesophageal reflux disease)    Hyperlipidemia    Hypertension    Joint pain    Muscle pain    Nephrolithiasis    OSA on CPAP 07/04/2018   - Trial of CPAP therapy on 17 cm H2O with a Large size [employer] Full Face Mask Simplus mask and heated humidification.   Plantar fasciitis  Pulmonary nodule 04/2016   [place].  Pt chose to repeat CT chest w/out contrast in 3 mo---as per radiologist's recommendations.   Right inguinal hernia    fat-containing.  Small hydrocele in left hemiscrotum.   Snoring    Swelling    of legs/feet/hands   Tobacco dependence in remission    Allergies  Allergen Reactions   Penicillins Hives   Celebrex [Celecoxib]    Cymbalta [Duloxetine Hcl]    Lyrica [Pregabalin]    Past Surgical History:  Procedure Laterality Date   COLONOSCOPY     fracture repair left arm     had a fixator   INGUINAL HERNIA REPAIR Left 2013   repeat hernia repair. x2   KNEE ARTHROSCOPY Right    LUMBAR DISC SURGERY  1993   NEURECTOMY inguinal after hernia repair Left    PLANTAR FASCIA SURGERY Right 2000s   UPPER GI ENDOSCOPY     Family History  Problem Relation Age of Onset   Lung cancer Mother    Lung cancer Father    Bone cancer Brother        bone   Heart disease Maternal Uncle    Colon cancer Neg Hx    Social History   Social History Narrative   Married, Arts administrator. Children (2) Adult, 4 grandchildren.    9 th grade, Retired.   Wears seatbelt   Smoke detector in the home.    Firearms locked in the home.    Feels safe in his relationships.     Allergies as of 08/14/2022       Reactions   Penicillins Hives   Celebrex [celecoxib]    Cymbalta [duloxetine Hcl]    Lyrica [pregabalin]         Medication List        Accurate as of August 14, 2022  1:03 PM. If you have any questions, ask your nurse or doctor.          STOP taking these medications     amitriptyline 25 MG tablet Commonly known as: ELAVIL Stopped by: Howard Pouch, DO       TAKE these medications    amLODipine 10 MG tablet Commonly known as: NORVASC Take 1 tablet (10 mg total) by mouth daily.   aspirin 81 MG tablet Take 81 mg by mouth daily.   baclofen 20 MG tablet Commonly known as: LIORESAL Take 0.5-1 tablets (10-20 mg total) by mouth 3 (three) times daily.   diazepam 5 MG tablet Commonly known as: VALIUM 1 tab PO 1 hour before procedure, may repeat in 1 hour if needed   escitalopram 20 MG tablet Commonly known as: LEXAPRO Take 1 tablet (20 mg total) by mouth daily.   gabapentin 600 MG tablet Commonly known as: NEURONTIN Take 1 tablet (600 mg total) by mouth 3 (three) times daily. 1 tab morning, 1.5 tabs afternoon, 1 tab evening,  doses should be every 8 hours.   hydrochlorothiazide 25 MG tablet Commonly known as: HYDRODIURIL Take 1 tablet (25 mg total) by mouth daily.   lisinopril 10 MG tablet Commonly known as: ZESTRIL Take 1 tablet (10 mg total) by mouth daily.   MegaRed Omega-3 Krill Oil 500 MG Caps Take 2 capsules by mouth daily.   oxyCODONE-acetaminophen 10-325 MG tablet Commonly known as: PERCOCET Take 1 tablet by mouth every 8 (eight) hours as needed for pain.   oxyCODONE-acetaminophen 10-325 MG tablet Commonly known as: PERCOCET Take 1 tablet by mouth every 8 (eight) hours as needed for  pain.   oxyCODONE-acetaminophen 10-325 MG tablet Commonly known as: PERCOCET Take 1 tablet by mouth every 8 (eight) hours as needed for pain.   pantoprazole 40 MG tablet Commonly known as: PROTONIX Take 1 tablet (40 mg total) by mouth daily.   simvastatin 10 MG tablet Commonly known as: ZOCOR Take 1 tablet (10 mg total) by mouth every other day.       All past medical history, surgical history, allergies, family history, immunizations andmedications were updated in the EMR today and reviewed under the history and medication portions of  their EMR.     ROS 14 pt review of systems performed and negative (unless mentioned in an HPI)  Objective: BP 108/68   Pulse 88   Temp (!) 97.3 F (36.3 C)   Wt 206 lb 3.2 oz (93.5 kg)   SpO2 97%   BMI 27.20 kg/m  Physical Exam Vitals and nursing note reviewed.  Constitutional:      General: He is not in acute distress.    Appearance: Normal appearance. He is not ill-appearing, toxic-appearing or diaphoretic.  HENT:     Head: Normocephalic and atraumatic.  Eyes:     General: No scleral icterus.       Right eye: No discharge.        Left eye: No discharge.     Extraocular Movements: Extraocular movements intact.     Pupils: Pupils are equal, round, and reactive to light.  Cardiovascular:     Rate and Rhythm: Normal rate and regular rhythm.  Pulmonary:     Effort: Pulmonary effort is normal. No respiratory distress.     Breath sounds: Normal breath sounds. No wheezing, rhonchi or rales.  Musculoskeletal:     Cervical back: Neck supple.     Right lower leg: No edema.     Left lower leg: No edema.  Lymphadenopathy:     Cervical: No cervical adenopathy.  Skin:    General: Skin is warm and dry.     Coloration: Skin is not jaundiced or pale.     Findings: No rash.  Neurological:     Mental Status: He is alert and oriented to person, place, and time. Mental status is at baseline.  Psychiatric:        Mood and Affect: Mood normal.        Behavior: Behavior normal.        Thought Content: Thought content normal.        Judgment: Judgment normal.     No results found.  Assessment/plan: Nicholas Graham is a 71 y.o. male present for [facility] Cervical neck pain with evidence of disc disease/Chronic midline thoracic back pain/Thoracic degenerative disc disease/DDD (degenerative disc disease), lumbar/History of lumbar discectomy He is following with sports medicine, physical therapy and recently had cervical injections-again.  encounter every 3 months for pain management appt  only.  - current pain management is helping him maintain activity and improve his quality of life. -[place] controlled substance database reviewed and appropriate today - Pain contract signed > updated 09/12/2021 - UDS > 06/21/2021 completed> due next visit -Patient discontinued Mobic. -Continue gabapentin  600/600/600> did not tolerate higher dose (headache) -Continue baclofen 10-20 mg 3 times daily -Continue percocet 10-325 mg tID #90 prescribed with 2 refills (held) at pharmacy.  - f/u 3 mos   Essential hypertension, benign/dyslipidemia/overweight Stable Continue amlodipine 10 mg daily  Continue HCTZ. Continue lisinopril 10 mg daily Continue Zocor 10 mg q. OD - continue fish oil. -  continue ASA 81 mg - routine diet and exercise. F/u q 3 mos with other conditions.    Gastroesophageal reflux disease with esophagitis Stable Continue Protonix PRN   Anxiety/sleep disturbance:  Patient has upcoming colonoscopy.  Agreed to provide him with Valium 5 mg to take 1 hour prior to procedure. A controlled substance database reviewed and appropriate today. Decrease Elavil to 50 mg nightly> 25 mg nightly.  Patient states the higher dose caused him to have headaches. Continue lexapro 20 mg qd.   OSA on CPAP compliant  No follow-ups on file.   No orders of the defined types were placed in this encounter.  Meds ordered this encounter  Medications   oxyCODONE-acetaminophen (PERCOCET) 10-325 MG tablet    Sig: Take 1 tablet by mouth every 8 (eight) hours as needed for pain.    Dispense:  90 tablet    Refill:  0    May fill ~55 days after prescribed date   oxyCODONE-acetaminophen (PERCOCET) 10-325 MG tablet    Sig: Take 1 tablet by mouth every 8 (eight) hours as needed for pain.    Dispense:  90 tablet    Refill:  0   oxyCODONE-acetaminophen (PERCOCET) 10-325 MG tablet    Sig: Take 1 tablet by mouth every 8 (eight) hours as needed for pain.    Dispense:  90 tablet    Refill:   0   amLODipine (NORVASC) 10 MG tablet    Sig: Take 1 tablet (10 mg total) by mouth daily.    Dispense:  90 tablet    Refill:  1   baclofen (LIORESAL) 20 MG tablet    Sig: Take 0.5-1 tablets (10-20 mg total) by mouth 3 (three) times daily.    Dispense:  270 tablet    Refill:  1   diazepam (VALIUM) 5 MG tablet    Sig: 1 tab PO 1 hour before procedure, may repeat in 1 hour if needed    Dispense:  2 tablet    Refill:  0   gabapentin (NEURONTIN) 600 MG tablet    Sig: Take 1 tablet (600 mg total) by mouth 3 (three) times daily. 1 tab morning, 1.5 tabs afternoon, 1 tab evening,  doses should be every 8 hours.    Dispense:  270 tablet    Refill:  1    Pt reports not true allergy- was a combination of meds that caused symptoms.   hydrochlorothiazide (HYDRODIURIL) 25 MG tablet    Sig: Take 1 tablet (25 mg total) by mouth daily.    Dispense:  90 tablet    Refill:  1   lisinopril (ZESTRIL) 10 MG tablet    Sig: Take 1 tablet (10 mg total) by mouth daily.    Dispense:  90 tablet    Refill:  1   pantoprazole (PROTONIX) 40 MG tablet    Sig: Take 1 tablet (40 mg total) by mouth daily.    Dispense:  90 tablet    Refill:  3    This prescription was filled on 06/19/2019. Any refills authorized will be placed on file.   simvastatin (ZOCOR) 10 MG tablet    Sig: Take 1 tablet (10 mg total) by mouth every other day.    Dispense:  45 tablet    Refill:  1   Referral Orders  No referral(s) requested today     Note is dictated utilizing voice recognition software. Although note has been proof read prior to signing, occasional typographical errors still can be  missed. If any questions arise, please do not hesitate to call for verification.  Electronically signed by: Howard Pouch, DO [place]

## 2022-08-14 NOTE — Patient Instructions (Addendum)
Return in about 11 weeks (around 10/30/2022) for Routine chronic condition follow-up.        Great to see you today.  I have refilled the medication(s) we provide.   If labs were collected, we will inform you of lab results once received either by echart message or telephone call.   - echart message- for normal results that have been seen by the patient already.   - telephone call: abnormal results or if patient has not viewed results in their echart.

## 2022-08-15 ENCOUNTER — Ambulatory Visit (AMBULATORY_SURGERY_CENTER): Payer: Self-pay | Admitting: *Deleted

## 2022-08-15 VITALS — Ht 73.0 in | Wt 204.0 lb

## 2022-08-15 DIAGNOSIS — Z86010 Personal history of colonic polyps: Secondary | ICD-10-CM

## 2022-08-15 MED ORDER — NA SULFATE-K SULFATE-MG SULF 17.5-3.13-1.6 GM/177ML PO SOLN: 1.0000 | Freq: Once | ORAL | 0 refills | Status: AC

## 2022-08-15 NOTE — Progress Notes (Signed)
No egg or soy allergy known to patient  No issues known to pt with past sedation with any surgeries or procedures Patient denies ever being told they had issues or difficulty with intubation  No FH of Malignant Hyperthermia Pt is not on diet pills Pt is not on  home 02  Pt is not on blood thinners  Pt denies issues with constipation  No A fib or A flutter Have any cardiac testing pending--no Pt instructed to use [email or web address] or GoodRx for a price reduction on prep   

## 2022-08-24 ENCOUNTER — Encounter: Payer: Self-pay | Admitting: Primary Care

## 2022-08-24 ENCOUNTER — Ambulatory Visit (INDEPENDENT_AMBULATORY_CARE_PROVIDER_SITE_OTHER): Payer: PPO | Admitting: Primary Care

## 2022-08-24 VITALS — BP 104/68 | HR 72 | Temp 98.6°F | Ht 74.0 in | Wt 206.6 lb

## 2022-08-24 DIAGNOSIS — G4733 Obstructive sleep apnea (adult) (pediatric): Secondary | ICD-10-CM | POA: Diagnosis not present

## 2022-08-24 NOTE — Patient Instructions (Addendum)
Download from your CPAP machine shows excellent compliance, apneas are well-controlled on current pressure settings  Recommendations: Continue to wear CPAP every night min 4-6 hours  Orders: Mask fitting for resmed airfit n20 nasal cpap mask   Follow-up: 1 year with Dr. Annamaria Boots - 30 min visit / OSA

## 2022-08-24 NOTE — Assessment & Plan Note (Addendum)
-   Hx severe OSA, AHI 57/hour. Well controlled on auto CPAP. He is 97% compliant with CPAP use over the last 90 days, average usage 4 hours 23 mins. He continues to have snoring symptoms. Current pressure 10-15cm h20 without residual apneas. He would like to change masks, DME order placed for mask fitting and renew CPAP supplies. Recommend patient try wedge pillow to help with snoring. He has lost 30 lbs since 2020. FU with Dr. Annamaria Boots (new patient) in 1 year or sooner if needed.

## 2022-08-24 NOTE — Progress Notes (Signed)
$'@Patient't$  ID: Nicholas Graham, male    DOB: June 06, 1951, 71 y.o.   MRN: [id number]  Chief Complaint  Patient presents with   Consult    Sleep OSA CPAP [employer]. Epworth : 1    Referring provider: [name], DO  HPI: 71 year old male, former smoker quit in 2003 (90-pack-year history).  Past medical history significant for hypertension, OSA on CPAP.  Patient of Dr. Elsworth Soho, last seen in office on 11/26/2018.  08/24/2022 Patient presents today for overdue follow-up for OSA.   He has severe OSA HST 05/2018 severe OSA, AHI 57/hour, lowest desaturation 67%  On auto CPAP 10-16cm h20 with large full face mask  He reports compliance with CPAP.   He is having trouble with nasal pillow mask. Continues to have snoring symptoms.  He would like to change masks to resmed airfit n20 nasal cpap mask  Due for new CPAP machine in October 2024 Recommend trying wedge pillow   Airview download 07/25/22-08/23/22 Usage 29/30 days (97%); 18 days (60%) > 4 hours Average usage days used 4 hours 23 mins Pressure 10-15cm h20 (12.1cm h20-95%) Airleaks 33L/min (95%) AHI 1.0       Sleep questionnaire Symptoms-   Wakes up often; Snoring  Prior sleep study- 2019 Bedtime- 8pm Time to fall asleep- 10 mins  Nocturnal awakenings- 2-3 times Out of bed/start of day- 5am Weight changes- down 30 lbs since 2020 Do you operate heavy machinery- no Do you currently wear CPAP- yes Do you current wear oxygen- no Epworth- 2   Imaging: CT chest without contrast 04/2016 showed calcified granulomas and calcified mediastinal lymphadenopathy with a 7 x 8 mm nodule in the left mid lung which had a speck of calcification eccentric.     Allergies  Allergen Reactions   Penicillins Hives   Celebrex [Celecoxib]    Cymbalta [Duloxetine Hcl]    Lyrica [Pregabalin]      There is no immunization history on file for this patient.  Past Medical History:  Diagnosis Date   Arm fracture    left. Dx'd w/RSD post fracture    Arthritis    Carpal tunnel syndrome    Chest pain 2016   [facility] (Dr. Matthew Saras) doing stress echo, but feels like musculoskeletal chest wall pain is cause of his discomfort   Chronic back pain    COVID-19 06/2020   Deviated septum    Diverticulosis    GERD (gastroesophageal reflux disease)    Hyperlipidemia    Hypertension    Joint pain    Muscle pain    OSA on CPAP 07/04/2018   - Trial of CPAP therapy on 17 cm H2O with a Large size Fisher&Paykel Full Face Mask Simplus mask and heated humidification.   Plantar fasciitis    Pulmonary nodule 04/2016   Lingula.  Pt chose to repeat CT chest w/out contrast in 3 mo---as per radiologist's recommendations.   Right inguinal hernia    fat-containing.  Small hydrocele in left hemiscrotum.   Sleep apnea    Snoring    Swelling    of legs/feet/hands   Tobacco dependence in remission     Tobacco History: Social History   Tobacco Use  Smoking Status Former   Packs/day: 3.00   Years: 30.00   Total pack years: 90.00   Types: Cigarettes   Quit date: 07/16/2002   Years since quitting: 20.1  Smokeless Tobacco Never   Counseling given: Not Answered   Outpatient Medications Prior to Visit  Medication Sig Dispense  Refill   amLODipine (NORVASC) 10 MG tablet Take 1 tablet (10 mg total) by mouth daily. 90 tablet 1   aspirin 81 MG tablet Take 81 mg by mouth daily.     baclofen (LIORESAL) 20 MG tablet Take 0.5-1 tablets (10-20 mg total) by mouth 3 (three) times daily. 270 tablet 1   escitalopram (LEXAPRO) 20 MG tablet Take 1 tablet (20 mg total) by mouth daily. 90 tablet 1   gabapentin (NEURONTIN) 600 MG tablet Take 1 tablet (600 mg total) by mouth 3 (three) times daily. 1 tab morning, 1.5 tabs afternoon, 1 tab evening,  doses should be every 8 hours. 270 tablet 1   hydrochlorothiazide (HYDRODIURIL) 25 MG tablet Take 1 tablet (25 mg total) by mouth daily. 90 tablet 1   lisinopril (ZESTRIL) 10 MG tablet Take 1 tablet (10 mg total)  by mouth daily. 90 tablet 1   MegaRed Omega-3 Krill Oil 500 MG CAPS Take 2 capsules by mouth daily.      oxyCODONE-acetaminophen (PERCOCET) 10-325 MG tablet Take 1 tablet by mouth every 8 (eight) hours as needed for pain. 90 tablet 0   oxyCODONE-acetaminophen (PERCOCET) 10-325 MG tablet Take 1 tablet by mouth every 8 (eight) hours as needed for pain. 90 tablet 0   oxyCODONE-acetaminophen (PERCOCET) 10-325 MG tablet Take 1 tablet by mouth every 8 (eight) hours as needed for pain. 90 tablet 0   pantoprazole (PROTONIX) 40 MG tablet Take 1 tablet (40 mg total) by mouth daily. 90 tablet 3   simvastatin (ZOCOR) 10 MG tablet Take 1 tablet (10 mg total) by mouth every other day. 45 tablet 1   diazepam (VALIUM) 5 MG tablet 1 tab PO 1 hour before procedure, may repeat in 1 hour if needed (Patient not taking: Reported on 08/24/2022) 2 tablet 0   No facility-administered medications prior to visit.      Review of Systems  Review of Systems  Constitutional: Negative.   HENT: Negative.    Respiratory: Negative.    Cardiovascular: Negative.      Physical Exam  BP 104/68 (BP Location: Right Arm, Patient Position: Sitting, Cuff Size: Normal)   Pulse 72   Temp 98.6 F (37 C) (Oral)   Ht '6\' 2"'$  (1.88 m)   Wt 206 lb 9.6 oz (93.7 kg)   SpO2 98%   BMI 26.53 kg/m  Physical Exam Constitutional:      Appearance: Normal appearance.  HENT:     Head: Normocephalic and atraumatic.     Mouth/Throat:     Mouth: Mucous membranes are moist.     Pharynx: Oropharynx is clear.  Cardiovascular:     Rate and Rhythm: Normal rate and regular rhythm.  Pulmonary:     Effort: Pulmonary effort is normal.     Breath sounds: Normal breath sounds.  Musculoskeletal:        General: Normal range of motion.  Skin:    General: Skin is warm and dry.  Neurological:     General: No focal deficit present.     Mental Status: He is alert and oriented to person, place, and time. Mental status is at baseline.   Psychiatric:        Mood and Affect: Mood normal.        Behavior: Behavior normal.        Thought Content: Thought content normal.        Judgment: Judgment normal.      Lab Results:  CBC    Component Value Date/Time  WBC 4.7 05/29/2022 0827   RBC 4.95 05/29/2022 0827   HGB 15.0 05/29/2022 0827   HCT 45.1 05/29/2022 0827   PLT 147.0 (L) 05/29/2022 0827   MCV 91.0 05/29/2022 0827   MCHC 33.3 05/29/2022 0827   RDW 14.5 05/29/2022 0827   LYMPHSABS 1.4 06/23/2019 0907   MONOABS 0.5 06/23/2019 0907   EOSABS 0.1 06/23/2019 0907   BASOSABS 0.0 06/23/2019 0907    BMET    Component Value Date/Time   NA 140 05/29/2022 0827   NA 138 07/06/2018 0000   K 4.1 05/29/2022 0827   CL 103 05/29/2022 0827   CO2 27 05/29/2022 0827   GLUCOSE 110 (H) 05/29/2022 0827   BUN 16 05/29/2022 0827   BUN 17 07/06/2018 0000   CREATININE 0.93 05/29/2022 0827   CALCIUM 9.5 05/29/2022 0827    BNP No results found for: "BNP"  ProBNP No results found for: "PROBNP"  Imaging: No results found.   Assessment & Plan:   OSA on CPAP - Hx severe OSA, AHI 57/hour. Well controlled on auto CPAP. He is 97% compliant with CPAP use over the last 90 days, average usage 4 hours 23 mins. He continues to have snoring symptoms. Current pressure 10-15cm h20 without residual apneas. He would like to change masks, DME order placed for mask fitting and renew CPAP supplies. Recommend patient try wedge pillow to help with snoring. He has lost 30 lbs since 2020. FU with Dr. Annamaria Boots (new patient) in 1 year or sooner if needed.    Martyn Ehrich, NP 08/24/2022

## 2022-08-28 DIAGNOSIS — G4733 Obstructive sleep apnea (adult) (pediatric): Secondary | ICD-10-CM | POA: Diagnosis not present

## 2022-09-04 ENCOUNTER — Encounter: Payer: Self-pay | Admitting: Gastroenterology

## 2022-09-05 ENCOUNTER — Ambulatory Visit (AMBULATORY_SURGERY_CENTER): Payer: PPO | Admitting: Gastroenterology

## 2022-09-05 ENCOUNTER — Encounter: Payer: Self-pay | Admitting: Gastroenterology

## 2022-09-05 VITALS — BP 116/67 | HR 65 | Temp 97.5°F | Resp 14 | Ht 73.0 in | Wt 200.2 lb

## 2022-09-05 DIAGNOSIS — D120 Benign neoplasm of cecum: Secondary | ICD-10-CM | POA: Diagnosis not present

## 2022-09-05 DIAGNOSIS — Z86010 Personal history of colonic polyps: Secondary | ICD-10-CM | POA: Diagnosis not present

## 2022-09-05 DIAGNOSIS — Z09 Encounter for follow-up examination after completed treatment for conditions other than malignant neoplasm: Secondary | ICD-10-CM | POA: Diagnosis not present

## 2022-09-05 DIAGNOSIS — K635 Polyp of colon: Secondary | ICD-10-CM | POA: Diagnosis not present

## 2022-09-05 DIAGNOSIS — D123 Benign neoplasm of transverse colon: Secondary | ICD-10-CM

## 2022-09-05 DIAGNOSIS — G4733 Obstructive sleep apnea (adult) (pediatric): Secondary | ICD-10-CM | POA: Diagnosis not present

## 2022-09-05 MED ORDER — SODIUM CHLORIDE 0.9 % IV SOLN: 500.0000 mL | Freq: Once | INTRAVENOUS | Status: DC

## 2022-09-05 NOTE — Progress Notes (Signed)
[facility] History and Physical   Primary Care Physician:  [name], DO   Reason for Procedure:   History of colon polyps  Plan:    colonoscopy     HPI: Nicholas Graham is a 71 y.o. male  here for colonoscopy surveillance - 3 adenomas removed 06/2016.   Patient denies any bowel symptoms at this time. No family history of colon cancer known. Otherwise feels well without any cardiopulmonary symptoms.   I have discussed risks / benefits of anesthesia and endoscopic procedure with [name] and they wish to proceed with the exams as outlined today.    Past Medical History:  Diagnosis Date   Arm fracture    left. Dx'd w/RSD post fracture   Arthritis    Carpal tunnel syndrome    Chest pain 2016   [facility] (Dr. Matthew Saras) doing stress echo, but feels like musculoskeletal chest wall pain is cause of his discomfort   Chronic back pain    COVID-19 06/2020   Deviated septum    Diverticulosis    GERD (gastroesophageal reflux disease)    Hyperlipidemia    Hypertension    Joint pain    Muscle pain    OSA on CPAP 07/04/2018   - Trial of CPAP therapy on 17 cm H2O with a Large size Fisher&Paykel Full Face Mask Simplus mask and heated humidification.   Plantar fasciitis    Pulmonary nodule 04/2016   Lingula.  Pt chose to repeat CT chest w/out contrast in 3 mo---as per radiologist's recommendations.   Right inguinal hernia    fat-containing.  Small hydrocele in left hemiscrotum.   Sleep apnea    Snoring    Swelling    of legs/feet/hands   Tobacco dependence in remission     Past Surgical History:  Procedure Laterality Date   COLONOSCOPY     fracture repair left arm     had a fixator   INGUINAL HERNIA REPAIR Left 2013   repeat hernia repair. x2   KNEE ARTHROSCOPY Right    LUMBAR [place] inguinal after hernia repair Left    PLANTAR FASCIA SURGERY Right 2000s   SHOULDER ARTHROSCOPY Right    UPPER GI ENDOSCOPY       Prior to Admission medications   Medication Sig Start Date End Date Taking? Authorizing Provider  amLODipine (NORVASC) 10 MG tablet Take 1 tablet (10 mg total) by mouth daily. 08/14/22   Kuneff, Renee A, DO  aspirin 81 MG tablet Take 81 mg by mouth daily.    [provider]  baclofen (LIORESAL) 20 MG tablet Take 0.5-1 tablets (10-20 mg total) by mouth 3 (three) times daily. 08/14/22   Kuneff, Renee A, DO  diazepam (VALIUM) 5 MG tablet 1 tab PO 1 hour before procedure, may repeat in 1 hour if needed Patient not taking: Reported on 08/24/2022 08/14/22   Kuneff, Renee A, DO  escitalopram (LEXAPRO) 20 MG tablet Take 1 tablet (20 mg total) by mouth daily. 05/29/22   Kuneff, Renee A, DO  gabapentin (NEURONTIN) 600 MG tablet Take 1 tablet (600 mg total) by mouth 3 (three) times daily. 1 tab morning, 1.5 tabs afternoon, 1 tab evening,  doses should be every 8 hours. 08/14/22   Kuneff, Renee A, DO  hydrochlorothiazide (HYDRODIURIL) 25 MG tablet Take 1 tablet (25 mg total) by mouth daily. 08/14/22   Kuneff, Renee A, DO  lisinopril (ZESTRIL) 10 MG tablet Take 1 tablet (10 mg  total) by mouth daily. 08/14/22   Kuneff, Renee A, DO  MegaRed Omega-3 Krill Oil 500 MG CAPS Take 2 capsules by mouth daily.     [provider]  oxyCODONE-acetaminophen (PERCOCET) 10-325 MG tablet Take 1 tablet by mouth every 8 (eight) hours as needed for pain. 08/14/22   Kuneff, Renee A, DO  oxyCODONE-acetaminophen (PERCOCET) 10-325 MG tablet Take 1 tablet by mouth every 8 (eight) hours as needed for pain. 08/14/22   Kuneff, Renee A, DO  oxyCODONE-acetaminophen (PERCOCET) 10-325 MG tablet Take 1 tablet by mouth every 8 (eight) hours as needed for pain. 08/14/22   Kuneff, Renee A, DO  pantoprazole (PROTONIX) 40 MG tablet Take 1 tablet (40 mg total) by mouth daily. 08/14/22   Kuneff, Renee A, DO  simvastatin (ZOCOR) 10 MG tablet Take 1 tablet (10 mg total) by mouth every other day. 08/14/22   Howard Pouch A, DO     Current Outpatient Medications  Medication Sig Dispense Refill   amLODipine (NORVASC) 10 MG tablet Take 1 tablet (10 mg total) by mouth daily. 90 tablet 1   aspirin 81 MG tablet Take 81 mg by mouth daily.     baclofen (LIORESAL) 20 MG tablet Take 0.5-1 tablets (10-20 mg total) by mouth 3 (three) times daily. 270 tablet 1   diazepam (VALIUM) 5 MG tablet 1 tab PO 1 hour before procedure, may repeat in 1 hour if needed 2 tablet 0   escitalopram (LEXAPRO) 20 MG tablet Take 1 tablet (20 mg total) by mouth daily. 90 tablet 1   gabapentin (NEURONTIN) 600 MG tablet Take 1 tablet (600 mg total) by mouth 3 (three) times daily. 1 tab morning, 1.5 tabs afternoon, 1 tab evening,  doses should be every 8 hours. 270 tablet 1   hydrochlorothiazide (HYDRODIURIL) 25 MG tablet Take 1 tablet (25 mg total) by mouth daily. 90 tablet 1   lisinopril (ZESTRIL) 10 MG tablet Take 1 tablet (10 mg total) by mouth daily. 90 tablet 1   MegaRed Omega-3 Krill Oil 500 MG CAPS Take 2 capsules by mouth daily.      oxyCODONE-acetaminophen (PERCOCET) 10-325 MG tablet Take 1 tablet by mouth every 8 (eight) hours as needed for pain. 90 tablet 0   simvastatin (ZOCOR) 10 MG tablet Take 1 tablet (10 mg total) by mouth every other day. 45 tablet 1   oxyCODONE-acetaminophen (PERCOCET) 10-325 MG tablet Take 1 tablet by mouth every 8 (eight) hours as needed for pain. (Patient not taking: Reported on 09/05/2022) 90 tablet 0   oxyCODONE-acetaminophen (PERCOCET) 10-325 MG tablet Take 1 tablet by mouth every 8 (eight) hours as needed for pain. (Patient not taking: Reported on 09/05/2022) 90 tablet 0   pantoprazole (PROTONIX) 40 MG tablet Take 1 tablet (40 mg total) by mouth daily. 90 tablet 3   Current Facility-Administered Medications  Medication Dose Route Frequency Provider Last Rate Last Admin   0.9 %  sodium chloride infusion  500 mL Intravenous Once Yetta Flock, MD        Allergies as of 09/05/2022 - Review Complete  09/05/2022  Allergen Reaction Noted   Penicillins Hives    Celebrex [celecoxib]  07/16/2013   Cymbalta [duloxetine hcl]  07/16/2013   Lyrica [pregabalin]  07/16/2013    Family History  Problem Relation Age of Onset   Lung cancer Mother    Lung cancer Father    Bone cancer Brother        bone   Heart disease Maternal Uncle  Colon cancer Neg Hx    Esophageal cancer Neg Hx    Stomach cancer Neg Hx     Social History   Socioeconomic History   Marital status: Married    Spouse name: Not on file   Number of children: 2   Years of education: Not on file   Highest education level: 10th grade  Occupational History   Not on file  Tobacco Use   Smoking status: Former    Packs/day: 3.00    Years: 30.00    Total pack years: 90.00    Types: Cigarettes    Quit date: 07/16/2002    Years since quitting: 20.1   Smokeless tobacco: Never  Vaping Use   Vaping Use: Never used  Substance and Sexual Activity   Alcohol use: Yes    Alcohol/week: 1.0 - 2.0 standard drink of alcohol    Types: 1 - 2 Cans of beer per week    Comment: socially   Drug use: No   Sexual activity: Yes  Other Topics Concern   Not on file  Social History Narrative   Married, [name]. Children (2) Adult, 4 grandchildren.    9 th grade, Retired.   Wears seatbelt   Smoke detector in the home.    Firearms locked in the home.    Feels safe in his relationships.    Social Determinants of Health   Financial Resource Strain: Low Risk  (06/15/2022)   Overall Financial Resource Strain (CARDIA)    Difficulty of Paying Living Expenses: Not hard at all  Food Insecurity: No Food Insecurity (06/15/2022)   Hunger Vital Sign    Worried About Running Out of Food in the Last Year: Never true    Ran Out of Food in the Last Year: Never true  Transportation Needs: No Transportation Needs (06/15/2022)   PRAPARE - Hydrologist (Medical): No    Lack of Transportation (Non-Medical): No  Physical  Activity: Sufficiently Active (06/15/2022)   Exercise Vital Sign    Days of Exercise per Week: 7 days    Minutes of Exercise per Session: 70 min  Stress: No Stress Concern Present (06/15/2022)   [place]    Feeling of Stress : Not at all  Social Connections: Moderately Isolated (06/15/2022)   Social Connection and Isolation Panel [NHANES]    Frequency of Communication with Friends and Family: More than three times a week    Frequency of Social Gatherings with Friends and Family: Twice a week    Attends Religious Services: Never    Marine scientist or Organizations: No    Attends Archivist Meetings: Never    Marital Status: Married  Human resources officer Violence: Not At Risk (06/15/2022)   Humiliation, Afraid, Rape, and Kick questionnaire    Fear of Current or Ex-Partner: No    Emotionally Abused: No    Physically Abused: No    Sexually Abused: No    Review of Systems: All other review of systems negative except as mentioned in the HPI.  Physical Exam: Vital signs BP 117/64   Pulse 76   Temp (!) 97.5 F (36.4 C) (Skin)   Ht '6\' 1"'$  (1.854 m)   Wt 200 lb 3.2 oz (90.8 kg)   SpO2 98%   BMI 26.41 kg/m   General:   Alert,  Well-developed, pleasant and cooperative in NAD Lungs:  Clear throughout to auscultation.   Heart:  Regular  rate and rhythm Abdomen:  Soft, nontender and nondistended.   Neuro/Psych:  Alert and cooperative. Normal mood and affect. A and O x 3  Jolly Mango, MD [facility]

## 2022-09-05 NOTE — Patient Instructions (Signed)
Please read handouts provided. Continue present medications. Await pathology results.   YOU HAD AN ENDOSCOPIC PROCEDURE TODAY AT THE [facility]:   Refer to the procedure report that was given to you for any specific questions about what was found during the examination.  If the procedure report does not answer your questions, please call your gastroenterologist to clarify.  If you requested that your care partner not be given the details of your procedure findings, then the procedure report has been included in a sealed envelope for you to review at your convenience later.  YOU SHOULD EXPECT: Some feelings of bloating in the abdomen. Passage of more gas than usual.  Walking can help get rid of the air that was put into your GI tract during the procedure and reduce the bloating. If you had a lower endoscopy (such as a colonoscopy or flexible sigmoidoscopy) you may notice spotting of blood in your stool or on the toilet paper. If you underwent a bowel prep for your procedure, you may not have a normal bowel movement for a few days.  Please Note:  You might notice some irritation and congestion in your nose or some drainage.  This is from the oxygen used during your procedure.  There is no need for concern and it should clear up in a day or so.  SYMPTOMS TO REPORT IMMEDIATELY:  Following lower endoscopy (colonoscopy or flexible sigmoidoscopy):  Excessive amounts of blood in the stool  Significant tenderness or worsening of abdominal pains  Swelling of the abdomen that is new, acute  Fever of 100F or higher  For urgent or emergent issues, a gastroenterologist can be reached at any hour by calling [phone]. Do not use MyChart messaging for urgent concerns.    DIET:  We do recommend a small meal at first, but then you may proceed to your regular diet.  Drink plenty of fluids but you should avoid alcoholic beverages for 24 hours.  ACTIVITY:  You should plan to take it easy for  the rest of today and you should NOT DRIVE or use heavy machinery until tomorrow (because of the sedation medicines used during the test).    FOLLOW UP: Our staff will call the number listed on your records the next business day following your procedure.  We will call around 7:15- 8:00 am to check on you and address any questions or concerns that you may have regarding the information given to you following your procedure. If we do not reach you, we will leave a message.     If any biopsies were taken you will be contacted by phone or by letter within the next 1-3 weeks.  Please call us at [phone] if you have not heard about the biopsies in 3 weeks.    SIGNATURES/CONFIDENTIALITY: You and/or your care partner have signed paperwork which will be entered into your electronic medical record.  These signatures attest to the fact that that the information above on your After Visit Summary has been reviewed and is understood.  Full responsibility of the confidentiality of this discharge information lies with you and/or your care-partner. 

## 2022-09-05 NOTE — Progress Notes (Signed)
VS by DT  Pt's states no medical or surgical changes since previsit or office visit.  

## 2022-09-05 NOTE — Progress Notes (Signed)
Sedate, gd SR, tolerated procedure well, VSS, report to RN 

## 2022-09-05 NOTE — Op Note (Signed)
[facility] Patient Name: Jaquise Faux Procedure Date: 09/05/2022 2:44 PM MRN: [id number] Endoscopist: Remo Lipps P. Havery Moros , MD, [id number] Age: 71 Referring MD:  Date of Birth: 1950/10/28 Gender: Male Account #: [id number] Procedure:                Colonoscopy Indications:              High risk colon cancer surveillance: Personal                            history of colonic polyps - a few adenomas removed                            06/2016 Medicines:                Monitored Anesthesia Care Procedure:                Pre-Anesthesia Assessment:                           - Prior to the procedure, a History and Physical                            was performed, and patient medications and                            allergies were reviewed. The patient's tolerance of                            previous anesthesia was also reviewed. The risks                            and benefits of the procedure and the sedation                            options and risks were discussed with the patient.                            All questions were answered, and informed consent                            was obtained. Prior Anticoagulants: The patient has                            taken no anticoagulant or antiplatelet agents. ASA                            Grade Assessment: III - A patient with severe                            systemic disease. After reviewing the risks and                            benefits, the patient was deemed in satisfactory  condition to undergo the procedure.                           After obtaining informed consent, the colonoscope                            was passed under direct vision. Throughout the                            procedure, the patient's blood pressure, pulse, and                            oxygen saturations were monitored continuously. The                            CF [id number] #[id number] was introduced through  the anus                            and advanced to the the cecum, identified by                            appendiceal orifice and ileocecal valve. The                            colonoscopy was performed without difficulty. The                            patient tolerated the procedure well. The quality                            of the bowel preparation was adequate. The                            ileocecal valve, appendiceal orifice, and rectum                            were photographed. Scope In: 3:08:41 PM Scope Out: 3:41:33 PM Scope Withdrawal Time: 0 hours 24 minutes 54 seconds  Total Procedure Duration: 0 hours 32 minutes 52 seconds  Findings:                 The perianal and digital rectal examinations were                            normal.                           A diminutive polyp was found in the cecum. The                            polyp was sessile. The polyp was removed with a                            cold snare. Resection and retrieval were complete.  A 3 mm polyp was found in the transverse colon. The                            polyp was sessile. The polyp was removed with a                            cold snare. Resection and retrieval were complete.                           Many medium-mouthed diverticula were found in the                            left colon.                           A large amount of semi-liquid stool was found in                            the entire colon, making visualization difficult.                            Lavage of the colon was performed using copious                            amounts of sterile water, resulting in clearance                            with adequate visualization. This process took                            several minutes and prolonged the exam.                           Internal hemorrhoids were found during retroflexion.                           The exam was otherwise without  abnormality. Complications:            No immediate complications. Estimated blood loss:                            Minimal. Estimated Blood Loss:     Estimated blood loss was minimal. Impression:               - One diminutive polyp in the cecum, removed with a                            cold snare. Resected and retrieved.                           - One 3 mm polyp in the transverse colon, removed                            with a cold snare. Resected and retrieved.                           -  Diverticulosis in the left colon.                           - Stool in the entire examined colon requiring                            extensive lavage.                           - Internal hemorrhoids.                           - The examination was otherwise normal. Recommendation:           - Patient has a contact number available for                            emergencies. The signs and symptoms of potential                            delayed complications were discussed with the                            patient. Return to normal activities tomorrow.                            Written discharge instructions were provided to the                            patient.                           - Resume previous diet.                           - Continue present medications.                           - Await pathology results.                           - Double prep recommended for the patient's next                            colonoscopy Jovie Swanner P. Tanyon Alipio, MD 09/05/2022 3:47:19 PM This report has been signed electronically.

## 2022-09-05 NOTE — Progress Notes (Signed)
Called to room to assist during endoscopic procedure.  Patient ID and intended procedure confirmed with present staff. Received instructions for my participation in the procedure from the performing physician.  

## 2022-09-06 ENCOUNTER — Telehealth: Payer: Self-pay | Admitting: *Deleted

## 2022-09-06 NOTE — Telephone Encounter (Signed)
  Follow up Call-     09/05/2022    2:51 PM  Call back number  Post procedure Call Back phone  # [phone]  Permission to leave phone message Yes     Patient questions:   Mailbox is full.

## 2022-11-13 ENCOUNTER — Ambulatory Visit (INDEPENDENT_AMBULATORY_CARE_PROVIDER_SITE_OTHER): Payer: PPO | Admitting: Family Medicine

## 2022-11-13 ENCOUNTER — Encounter: Payer: Self-pay | Admitting: Family Medicine

## 2022-11-13 VITALS — BP 105/64 | HR 83 | Temp 97.9°F | Wt 204.8 lb

## 2022-11-13 DIAGNOSIS — M5136 Other intervertebral disc degeneration, lumbar region: Secondary | ICD-10-CM | POA: Diagnosis not present

## 2022-11-13 DIAGNOSIS — Z0289 Encounter for other administrative examinations: Secondary | ICD-10-CM

## 2022-11-13 DIAGNOSIS — M5090 Cervical disc disorder, unspecified, unspecified cervical region: Secondary | ICD-10-CM

## 2022-11-13 DIAGNOSIS — E785 Hyperlipidemia, unspecified: Secondary | ICD-10-CM | POA: Diagnosis not present

## 2022-11-13 DIAGNOSIS — G8929 Other chronic pain: Secondary | ICD-10-CM | POA: Diagnosis not present

## 2022-11-13 DIAGNOSIS — I10 Essential (primary) hypertension: Secondary | ICD-10-CM | POA: Diagnosis not present

## 2022-11-13 DIAGNOSIS — G4733 Obstructive sleep apnea (adult) (pediatric): Secondary | ICD-10-CM | POA: Diagnosis not present

## 2022-11-13 DIAGNOSIS — M4802 Spinal stenosis, cervical region: Secondary | ICD-10-CM | POA: Diagnosis not present

## 2022-11-13 DIAGNOSIS — F419 Anxiety disorder, unspecified: Secondary | ICD-10-CM

## 2022-11-13 DIAGNOSIS — M5134 Other intervertebral disc degeneration, thoracic region: Secondary | ICD-10-CM | POA: Diagnosis not present

## 2022-11-13 DIAGNOSIS — Z9889 Other specified postprocedural states: Secondary | ICD-10-CM | POA: Diagnosis not present

## 2022-11-13 MED ORDER — OXYCODONE-ACETAMINOPHEN 10-325 MG PO TABS: 1.0000 | ORAL_TABLET | Freq: Three times a day (TID) | ORAL | 0 refills | Status: DC | PRN

## 2022-11-13 MED ORDER — ESCITALOPRAM OXALATE 20 MG PO TABS: 20.0000 mg | ORAL_TABLET | Freq: Every day | ORAL | 1 refills | Status: DC

## 2022-11-13 NOTE — Patient Instructions (Addendum)
Return in about 11 weeks (around 01/29/2023) for Routine chronic condition follow-up.        Great to see you today.  I have refilled the medication(s) we provide.   If labs were collected, we will inform you of lab results once received either by echart message or telephone call.   - echart message- for normal results that have been seen by the patient already.   - telephone call: abnormal results or if patient has not viewed results in their echart.

## 2022-11-13 NOTE — Progress Notes (Signed)
Patient ID: Nicholas Graham, male  DOB: May 24, 1951, 72 y.o.   MRN: [id number] Patient Care Team    Relationship Specialty Notifications Start End  [name], DO PCP - General Family Medicine  10/31/15   [name], MD (Inactive) Consulting Physician Gastroenterology  04/24/16   [name], MD Consulting Physician Orthopedic Surgery  10/26/21     Chief Complaint  Patient presents with   Hypertension    Subjective:  Nicholas Graham is a 72 y.o. male present for Chronic medical condition appointment All past medical history, surgical history, allergies, family history, immunizations, medications and social history were updated in the electronic medical record today. All recent labs, ED visits and hospitalizations within the last year were reviewed.   Cervical neck pain with evidence of disc disease/Chronic midline thoracic back pain/Thoracic degenerative disc disease/DDD (degenerative disc disease), lumbar/History of lumbar discectomy Pt presents for his chronic pain management treatment for his cervical and lumbar conditions. His discomfort has progressed since 2020-he feels he has some  relief from current regimen. He has been evaluated  by neuro and pain rehab.  He has received injections- that have only provided a few days of temporary relief in the past . He is frustrated, but has appropriate expectations that he will be living with some pain.  He reports he is maintaining a quality of life and able to remain active with Percocet 10-325 (1 tab every 8-12 hours), baclofen and gabapentin. 600/600/600 (had headaches at increased doses). He states he is walking a few miles a day.  He was able to do his walk today. He is established with  Dr. [name] to discuss MRI and surgical procedure for his neck pain. Indication for chronic opioid: Cervical spine degeneration, chronic neck and back pain. Medication and dose: Oxycodone-acetaminophen 10-325 1 tab TID prn daily # pills per  month: 60 Last UDS date:UYD 06/2021 Pain contract signed (Y/N): Yes- UTD 08/2021 Date narcotic database last reviewed (/include red flags): 02/15/22 03/08/2021  had epidural injection of cervical spine and he feels it is helpful.  11/2021-has had repeat injections that have not been as helpful. Original note: Lower cervical to mid thoracic pain still present. Using his right arm exacerbates his pain midline of thoracic.  He has a history thoracic spine degeneration and discectomy in his lumbar spine.  Has degenerative changes, bone spurring and facet arthropathy in his lumbar spine by x-rays obtained 11/2018. Cervical spine x-ray with mild bilateral foraminal narrowing at C3-C4 11/2018. Unfortunately he was reported as allergic or intolerant to many of the medications that would be helpful for him such as gabapentin, Lyrica, Celebrex and Cymbalta --> we discussed this in more detail last visit and he believes that the "allergy-hallucinations " was secondary to having multiple of these medicines on board at the same time back in 2014. We tried to start gabapentin and it did not have any side effects from medication. It has not been extremely helpful  Yet at lower doses. He admits to rather sigmnificant stomach upset starting from the naproxen BID use ober the last 8 week. Advanced imaging has not bee able to be completed 2/2 to covid outbreak. He desired to wait on referral to specialist last visit in hope that he would receive benefit from the gabapentin.   Hypertension/HLD/overweight: Pt reports compliance with amlodipine 10 mg daily, lisinopril 10 mg QD and HCTZ..  Patient denies chest pain, shortness of breath, dizziness or lower extremity edema.  extremity  edema.  Pt takes a daily baby ASA. Pt is  prescribed statin.  Labs UTD Diet: Low-sodium Exercise: Not exercising as routinely. RF: Hypertension, hyperlipidemia, family history of heart disease. Overweight.   ANXIETY/sleep disorder:  Patient  reports he is doing well on Lexapro 20 mg daily and would like to continue. Elavil  low dose started last appt he feels overall it is working well.  He does not feel any extra sedation throughout his day from medication.   Gerd: Patient reports he is still requiring PPI daily use in order to avoid symptoms.  Labs up-to-date 05/29/2022    11/13/2022    8:16 AM 06/15/2022    1:06 PM 11/30/2021    8:30 AM 06/21/2021    8:07 AM 06/10/2021    8:13 AM  Depression screen PHQ 2/9  Decreased Interest 0 0 0 0 0  Down, Depressed, Hopeless 0 0 0 0 0  PHQ - 2 Score 0 0 0 0 0  Altered sleeping   1    Tired, decreased energy   0    Change in appetite   1    Feeling bad or failure about yourself    0    Trouble concentrating   0    Moving slowly or fidgety/restless   0    Suicidal thoughts   0    PHQ-9 Score   2        11/30/2021    8:32 AM 01/14/2020    8:20 AM 12/16/2019    8:06 AM 06/23/2019    8:30 AM  GAD 7 : Generalized Anxiety Score  Nervous, Anxious, on Edge 0 0 0 0  Control/stop worrying 0 0 0 0  Worry too much - different things 0 0 0 0  Trouble relaxing 0 3 0 0  Restless 0 '3 3 1  '$ Easily annoyed or irritable 0 1 0 0  Afraid - awful might happen 0 0 0 0  Total GAD 7 Score 0 '7 3 1  '$ Anxiety Difficulty  Not difficult at all Somewhat difficult Not difficult at all            11/09/2022   10:50 AM 06/15/2022    1:06 PM 09/09/2021    9:24 AM 06/21/2021    8:07 AM 06/10/2021    8:10 AM  Fall Risk   Falls in the past year? 0 0 0 0 0  Number falls in past yr:  0  0 0  Injury with Fall?  0  0 0  Risk for fall due to :  No Fall Risks     Follow up  Falls evaluation completed  Falls evaluation completed      There is no immunization history on file for this patient.   Past Medical History:  Diagnosis Date   Arm fracture    left. Dx'd w/RSD post fracture   Arthritis    Carpal tunnel syndrome    Chest pain 2016   [facility] (Dr. Matthew Saras) doing stress echo, but feels like  musculoskeletal chest wall pain is cause of his discomfort   Chronic back pain    COVID-19 06/2020   Deviated septum    Diverticulosis    GERD (gastroesophageal reflux disease)    Hyperlipidemia    Hypertension    Joint pain    Muscle pain    OSA on CPAP 07/04/2018   - Trial of CPAP therapy on 17 cm H2O with a Large size Fisher&Paykel Full Face Mask Simplus  mask and heated humidification.   Plantar fasciitis    Pulmonary nodule 04/2016   [place].  Pt chose to repeat CT chest w/out contrast in 3 mo---as per radiologist's recommendations.   Right inguinal hernia    fat-containing.  Small hydrocele in left hemiscrotum.   Sleep apnea    Snoring    Swelling    of legs/feet/hands   Tobacco dependence in remission    Allergies  Allergen Reactions   Penicillins Hives   Celebrex [Celecoxib]    Cymbalta [Duloxetine Hcl]    Lyrica [Pregabalin]    Past Surgical History:  Procedure Laterality Date   COLONOSCOPY     fracture repair left arm     had a fixator   INGUINAL HERNIA REPAIR Left 2013   repeat hernia repair. x2   KNEE ARTHROSCOPY Right    LUMBAR DISC SURGERY  1993   NEURECTOMY inguinal after hernia repair Left    PLANTAR FASCIA SURGERY Right 2000s   SHOULDER ARTHROSCOPY Right    UPPER GI ENDOSCOPY     Family History  Problem Relation Age of Onset   Lung cancer Mother    Lung cancer Father    Bone cancer Brother        bone   Heart disease Maternal Uncle    Colon cancer Neg Hx    Esophageal cancer Neg Hx    Stomach cancer Neg Hx    Social History   Social History Narrative   Married, Arts administrator. Children (2) Adult, 4 grandchildren.    9 th grade, Retired.   Wears seatbelt   Smoke detector in the home.    Firearms locked in the home.    Feels safe in his relationships.     Allergies as of 11/13/2022       Reactions   Penicillins Hives   Celebrex [celecoxib]    Cymbalta [duloxetine Hcl]    Lyrica [pregabalin]         Medication List         Accurate as of November 13, 2022  8:35 AM. If you have any questions, ask your nurse or doctor.          STOP taking these medications    diazepam 5 MG tablet Commonly known as: VALIUM Stopped by: Howard Pouch, DO       TAKE these medications    amLODipine 10 MG tablet Commonly known as: NORVASC Take 1 tablet (10 mg total) by mouth daily.   aspirin 81 MG tablet Take 81 mg by mouth daily.   baclofen 20 MG tablet Commonly known as: LIORESAL Take 0.5-1 tablets (10-20 mg total) by mouth 3 (three) times daily.   escitalopram 20 MG tablet Commonly known as: LEXAPRO Take 1 tablet (20 mg total) by mouth daily.   gabapentin 600 MG tablet Commonly known as: NEURONTIN Take 1 tablet (600 mg total) by mouth 3 (three) times daily. 1 tab morning, 1.5 tabs afternoon, 1 tab evening,  doses should be every 8 hours.   hydrochlorothiazide 25 MG tablet Commonly known as: HYDRODIURIL Take 1 tablet (25 mg total) by mouth daily.   lisinopril 10 MG tablet Commonly known as: ZESTRIL Take 1 tablet (10 mg total) by mouth daily.   MegaRed Omega-3 Krill Oil 500 MG Caps Take 2 capsules by mouth daily.   oxyCODONE-acetaminophen 10-325 MG tablet Commonly known as: PERCOCET Take 1 tablet by mouth every 8 (eight) hours as needed for pain.   oxyCODONE-acetaminophen 10-325 MG tablet Commonly known as: PERCOCET  Take 1 tablet by mouth every 8 (eight) hours as needed for pain.   oxyCODONE-acetaminophen 10-325 MG tablet Commonly known as: PERCOCET Take 1 tablet by mouth every 8 (eight) hours as needed for pain.   pantoprazole 40 MG tablet Commonly known as: PROTONIX Take 1 tablet (40 mg total) by mouth daily.   simvastatin 10 MG tablet Commonly known as: ZOCOR Take 1 tablet (10 mg total) by mouth every other day.       All past medical history, surgical history, allergies, family history, immunizations andmedications were updated in the EMR today and reviewed under the history and  medication portions of their EMR.     ROS 14 pt review of systems performed and negative (unless mentioned in an HPI)  Objective: BP 105/64   Pulse 83   Temp 97.9 F (36.6 C)   Wt 204 lb 12.8 oz (92.9 kg)   SpO2 98%   BMI 27.02 kg/m  Physical Exam Vitals and nursing note reviewed.  Constitutional:      General: He is not in acute distress.    Appearance: Normal appearance. He is not ill-appearing, toxic-appearing or diaphoretic.  HENT:     Head: Normocephalic and atraumatic.  Eyes:     General: No scleral icterus.       Right eye: No discharge.        Left eye: No discharge.     Extraocular Movements: Extraocular movements intact.     Pupils: Pupils are equal, round, and reactive to light.  Cardiovascular:     Rate and Rhythm: Normal rate and regular rhythm.  Pulmonary:     Effort: Pulmonary effort is normal. No respiratory distress.     Breath sounds: Normal breath sounds. No wheezing, rhonchi or rales.  Musculoskeletal:     Cervical back: Neck supple.     Right lower leg: No edema.     Left lower leg: No edema.  Lymphadenopathy:     Cervical: No cervical adenopathy.  Skin:    General: Skin is warm and dry.     Coloration: Skin is not jaundiced or pale.     Findings: No rash.  Neurological:     Mental Status: He is alert and oriented to person, place, and time. Mental status is at baseline.  Psychiatric:        Mood and Affect: Mood normal.        Behavior: Behavior normal.        Thought Content: Thought content normal.        Judgment: Judgment normal.     No results found.  Assessment/plan: Nicholas Graham is a 72 y.o. male present for routine chronic conditions Cervical neck pain with evidence of disc disease/Chronic midline thoracic back pain/Thoracic degenerative disc disease/DDD (degenerative disc disease), lumbar/History of lumbar discectomy He is following with sports medicine, physical therapy and recently had cervical injections-again.  encounter  every 3 months for pain management appt only.  -Getting a quality life with pain management. -[place] controlled substance database reviewed and appropriate today - Pain contract signed > updated 09/12/2021 - UDS > 06/21/2021 completed> due next visit -Patient discontinued Mobic. -Continue gabapentin  600/600/600> did not tolerate higher dose (headache) -Continue baclofen 10-20 mg 3 times daily -Continue percocet 10-325 mg tID #90 prescribed with 2 refills (held) at pharmacy.  - f/u 3 mos   Essential hypertension, benign/dyslipidemia/overweight Stable Continue amlodipine 10 mg daily  Continue HCTZ. Continue lisinopril 10 mg daily Continue Zocor 10 mg q.  OD - continue fish oil. - continue ASA 81 mg - routine diet and exercise. F/u q 3 mos with other conditions.    Gastroesophageal reflux disease with esophagitis Stable continue Protonix PRN   Anxiety/sleep disturbance:  Elavil has been discontinued off his med list? Continue lexapro 20 mg qd.   OSA on CPAP compliant  Return in about 11 weeks (around 01/29/2023) for Routine chronic condition follow-up.   No orders of the defined types were placed in this encounter.  Meds ordered this encounter  Medications   escitalopram (LEXAPRO) 20 MG tablet    Sig: Take 1 tablet (20 mg total) by mouth daily.    Dispense:  90 tablet    Refill:  1    This prescription was filled on 05/12/2019. Any refills authorized will be placed on file.   oxyCODONE-acetaminophen (PERCOCET) 10-325 MG tablet    Sig: Take 1 tablet by mouth every 8 (eight) hours as needed for pain.    Dispense:  90 tablet    Refill:  0   oxyCODONE-acetaminophen (PERCOCET) 10-325 MG tablet    Sig: Take 1 tablet by mouth every 8 (eight) hours as needed for pain.    Dispense:  90 tablet    Refill:  0   oxyCODONE-acetaminophen (PERCOCET) 10-325 MG tablet    Sig: Take 1 tablet by mouth every 8 (eight) hours as needed for pain.    Dispense:  90 tablet    Refill:  0     May fill ~55 days after prescribed date   Referral Orders  No referral(s) requested today     Note is dictated utilizing voice recognition software. Although note has been proof read prior to signing, occasional typographical errors still can be missed. If any questions arise, please do not hesitate to call for verification.  Electronically signed by: Howard Pouch, DO [place]

## 2022-11-22 ENCOUNTER — Other Ambulatory Visit: Payer: Self-pay | Admitting: Family Medicine

## 2022-11-22 DIAGNOSIS — E785 Hyperlipidemia, unspecified: Secondary | ICD-10-CM

## 2022-11-29 DIAGNOSIS — G4733 Obstructive sleep apnea (adult) (pediatric): Secondary | ICD-10-CM | POA: Diagnosis not present

## 2022-12-18 ENCOUNTER — Encounter: Payer: Self-pay | Admitting: Family Medicine

## 2022-12-18 ENCOUNTER — Ambulatory Visit (INDEPENDENT_AMBULATORY_CARE_PROVIDER_SITE_OTHER): Payer: PPO | Admitting: Family Medicine

## 2022-12-18 VITALS — BP 106/62 | HR 66 | Temp 97.5°F | Wt 202.4 lb

## 2022-12-18 DIAGNOSIS — T148XXA Other injury of unspecified body region, initial encounter: Secondary | ICD-10-CM

## 2022-12-18 DIAGNOSIS — M7521 Bicipital tendinitis, right shoulder: Secondary | ICD-10-CM | POA: Diagnosis not present

## 2022-12-18 DIAGNOSIS — S46811A Strain of other muscles, fascia and tendons at shoulder and upper arm level, right arm, initial encounter: Secondary | ICD-10-CM | POA: Diagnosis not present

## 2022-12-18 MED ORDER — METHYLPREDNISOLONE ACETATE 80 MG/ML IJ SUSP
80.0000 mg | Freq: Once | INTRAMUSCULAR | Status: AC
  Administered 2022-12-18: 80 mg via INTRAMUSCULAR

## 2022-12-18 MED ORDER — PREDNISONE 20 MG PO TABS
ORAL_TABLET | ORAL | 0 refills | Status: DC
Start: 2022-12-19 — End: 2023-01-18

## 2022-12-18 NOTE — Progress Notes (Signed)
Nicholas Graham , 02/02/1951, 72 y.o., male MRN: [id number] Patient Care Team    Relationship Specialty Notifications Start End  [name], DO PCP - General Family Medicine  10/31/15   [name], MD (Inactive) Consulting Physician Gastroenterology  04/24/16   [name], MD Consulting Physician Orthopedic Surgery  10/26/21     Chief Complaint  Patient presents with   Shoulder Pain    Burning sensation since Thursday. Right shoulder. None of pain meds are helping     Subjective: Nicholas Graham is a 72 y.o. Pt presents for an OV with complaints of right shoulder pain of 5 days duration.  Associated symptoms include burning sensation right deltoid area.  Patient is on chronic pain meds including gabapentin, muscle relaxers and burning pain is still present in his right shoulder.  He states he was painting on Wednesday.  He he has a history of right shoulder pain and has established with Ortho and sports medicine surrounding condition.  He has undergone right shoulder arthroscopy.      11/13/2022    8:16 AM 06/15/2022    1:06 PM 11/30/2021    8:30 AM 06/21/2021    8:07 AM 06/10/2021    8:13 AM  Depression screen PHQ 2/9  Decreased Interest 0 0 0 0 0  Down, Depressed, Hopeless 0 0 0 0 0  PHQ - 2 Score 0 0 0 0 0  Altered sleeping   1    Tired, decreased energy   0    Change in appetite   1    Feeling bad or failure about yourself    0    Trouble concentrating   0    Moving slowly or fidgety/restless   0    Suicidal thoughts   0    PHQ-9 Score   2      Allergies  Allergen Reactions   Penicillins Hives   Celebrex [Celecoxib]    Cymbalta [Duloxetine Hcl]    Lyrica [Pregabalin]    Social History   Social History Narrative   Married, Arts administrator. Children (2) Adult, 4 grandchildren.    9 th grade, Retired.   Wears seatbelt   Smoke detector in the home.    Firearms locked in the home.    Feels safe in his relationships.    Past Medical History:  Diagnosis  Date   Arm fracture    left. Dx'd w/RSD post fracture   Arthritis    Carpal tunnel syndrome    Chest pain 2016   [facility] (Dr. Matthew Saras) doing stress echo, but feels like musculoskeletal chest wall pain is cause of his discomfort   Chronic back pain    COVID-19 06/2020   Deviated septum    Diverticulosis    GERD (gastroesophageal reflux disease)    Hyperlipidemia    Hypertension    Joint pain    Muscle pain    OSA on CPAP 07/04/2018   - Trial of CPAP therapy on 17 cm H2O with a Large size [employer]Paykel Full Face Mask Simplus mask and heated humidification.   Plantar fasciitis    Pulmonary nodule 04/2016   [place].  Pt chose to repeat CT chest w/out contrast in 3 mo---as per radiologist's recommendations.   Right inguinal hernia    fat-containing.  Small hydrocele in left hemiscrotum.   Sleep apnea    Snoring    Swelling    of legs/feet/hands   Tobacco dependence in remission  Past Surgical History:  Procedure Laterality Date   COLONOSCOPY     fracture repair left arm     had a fixator   INGUINAL HERNIA REPAIR Left 2013   repeat hernia repair. x2   KNEE ARTHROSCOPY Right    LUMBAR DISC SURGERY  1993   NEURECTOMY inguinal after hernia repair Left    PLANTAR FASCIA SURGERY Right 2000s   SHOULDER ARTHROSCOPY Right    UPPER GI ENDOSCOPY     Family History  Problem Relation Age of Onset   Lung cancer Mother    Lung cancer Father    Bone cancer Brother        bone   Heart disease Maternal Uncle    Colon cancer Neg Hx    Esophageal cancer Neg Hx    Stomach cancer Neg Hx    Allergies as of 12/18/2022       Reactions   Penicillins Hives   Celebrex [celecoxib]    Cymbalta [duloxetine Hcl]    Lyrica [pregabalin]         Medication List        Accurate as of December 18, 2022  9:44 AM. If you have any questions, ask your nurse or doctor.          amLODipine 10 MG tablet Commonly known as: NORVASC Take 1 tablet (10 mg total) by mouth daily.    aspirin 81 MG tablet Take 81 mg by mouth daily.   baclofen 20 MG tablet Commonly known as: LIORESAL Take 0.5-1 tablets (10-20 mg total) by mouth 3 (three) times daily.   escitalopram 20 MG tablet Commonly known as: LEXAPRO Take 1 tablet (20 mg total) by mouth daily.   gabapentin 600 MG tablet Commonly known as: NEURONTIN Take 1 tablet (600 mg total) by mouth 3 (three) times daily. 1 tab morning, 1.5 tabs afternoon, 1 tab evening,  doses should be every 8 hours.   hydrochlorothiazide 25 MG tablet Commonly known as: HYDRODIURIL Take 1 tablet (25 mg total) by mouth daily.   lisinopril 10 MG tablet Commonly known as: ZESTRIL Take 1 tablet (10 mg total) by mouth daily.   MegaRed Omega-3 Krill Oil 500 MG Caps Take 2 capsules by mouth daily.   oxyCODONE-acetaminophen 10-325 MG tablet Commonly known as: PERCOCET Take 1 tablet by mouth every 8 (eight) hours as needed for pain.   oxyCODONE-acetaminophen 10-325 MG tablet Commonly known as: PERCOCET Take 1 tablet by mouth every 8 (eight) hours as needed for pain.   oxyCODONE-acetaminophen 10-325 MG tablet Commonly known as: PERCOCET Take 1 tablet by mouth every 8 (eight) hours as needed for pain.   pantoprazole 40 MG tablet Commonly known as: PROTONIX Take 1 tablet (40 mg total) by mouth daily.   predniSONE 20 MG tablet Commonly known as: DELTASONE 60 mg x2d, 40 mg x3d, 20 mg x2d, 10 mg x2d Start taking on: December 19, 2022 Started by: Howard Pouch, DO   simvastatin 10 MG tablet Commonly known as: ZOCOR Take 1 tablet (10 mg total) by mouth every other day.        All past medical history, surgical history, allergies, family history, immunizations andmedications were updated in the EMR today and reviewed under the history and medication portions of their EMR.     ROS Negative, with the exception of above mentioned in HPI   Objective:  BP 106/62   Pulse 66   Temp (!) 97.5 F (36.4 C)   Wt 202 lb 6.4 oz (91.8 kg)  SpO2 98%   BMI 26.70 kg/m  Body mass index is 26.7 kg/m. Physical Exam Vitals and nursing note reviewed.  Constitutional:      General: He is not in acute distress.    Appearance: Normal appearance. He is not ill-appearing, toxic-appearing or diaphoretic.  HENT:     Head: Normocephalic and atraumatic.  Eyes:     General: No scleral icterus.       Right eye: No discharge.        Left eye: No discharge.     Extraocular Movements: Extraocular movements intact.     Pupils: Pupils are equal, round, and reactive to light.  Musculoskeletal:        General: Tenderness and signs of injury present. No swelling or deformity.     Right shoulder: Tenderness present. Decreased range of motion.     Left shoulder: Normal.     Comments: Right shoulder: Tenderness over bicipital groove and bicep tendons.  Tenderness over trapezius muscle group with spasm.  Tenderness deltoid muscle group.  Decreased range of motion due to discomfort.  Neurovascular intact distally.  Skin:    General: Skin is warm and dry.     Coloration: Skin is not jaundiced or pale.     Findings: No rash.  Neurological:     Mental Status: He is alert and oriented to person, place, and time. Mental status is at baseline.  Psychiatric:        Mood and Affect: Mood normal.        Behavior: Behavior normal.        Thought Content: Thought content normal.        Judgment: Judgment normal.      No results found. No results found. No results found for this or any previous visit (from the past 24 hour(s)).  Assessment/Plan: RAY TOYAMA is a 72 y.o. male present for OV for  Biceps tendinitis of right upper extremity/Muscle strain Rest. Heat. Stretches provided to start day 5-7.  Use muscle relaxants prescribed.  Caution on over doing it too soon.  IM depo medrol injection provided today.  Prednisone add't 9 days starting tomorrow.  F/u 2 weeks if not improving with ortho.   Reviewed expectations re: course of current  medical issues. Discussed self-management of symptoms. Outlined signs and symptoms indicating need for more acute intervention. Patient verbalized understanding and all questions were answered. Patient received an After-Visit Summary.    No orders of the defined types were placed in this encounter.  Meds ordered this encounter  Medications   predniSONE (DELTASONE) 20 MG tablet    Sig: 60 mg x2d, 40 mg x3d, 20 mg x2d, 10 mg x2d    Dispense:  15 tablet    Refill:  0   Referral Orders  No referral(s) requested today     Note is dictated utilizing voice recognition software. Although note has been proof read prior to signing, occasional typographical errors still can be missed. If any questions arise, please do not hesitate to call for verification.   electronically signed by:  Howard Pouch, DO  [place]

## 2022-12-18 NOTE — Addendum Note (Signed)
Addended by: Beatrix Fetters on: 12/18/2022 09:46 AM   Modules accepted: Orders

## 2022-12-18 NOTE — Patient Instructions (Addendum)
Return if symptoms worsen or fail to improve.        Great to see you today.  I have refilled the medication(s) we provide.   If labs were collected, we will inform you of lab results once received either by echart message or telephone call.   - echart message- for normal results that have been seen by the patient already.   - telephone call: abnormal results or if patient has not viewed results in their echart.'   Heat application a few times a day.  Use your muscle relaxants.  Start stretches in 5-7 days, using pain as your guide. Do not over do it to soon.  Steroid shot today. Start steroid pill tomorrow.

## 2023-01-08 DIAGNOSIS — M545 Low back pain, unspecified: Secondary | ICD-10-CM | POA: Diagnosis not present

## 2023-01-10 ENCOUNTER — Other Ambulatory Visit: Payer: Self-pay | Admitting: Family Medicine

## 2023-01-10 DIAGNOSIS — M545 Low back pain, unspecified: Secondary | ICD-10-CM

## 2023-01-15 ENCOUNTER — Other Ambulatory Visit: Payer: Self-pay | Admitting: Family Medicine

## 2023-01-15 DIAGNOSIS — Z9889 Other specified postprocedural states: Secondary | ICD-10-CM

## 2023-01-15 DIAGNOSIS — M51369 Other intervertebral disc degeneration, lumbar region without mention of lumbar back pain or lower extremity pain: Secondary | ICD-10-CM

## 2023-01-15 DIAGNOSIS — G8929 Other chronic pain: Secondary | ICD-10-CM

## 2023-01-15 DIAGNOSIS — M5090 Cervical disc disorder, unspecified, unspecified cervical region: Secondary | ICD-10-CM

## 2023-01-15 DIAGNOSIS — M5136 Other intervertebral disc degeneration, lumbar region: Secondary | ICD-10-CM

## 2023-01-15 DIAGNOSIS — M5134 Other intervertebral disc degeneration, thoracic region: Secondary | ICD-10-CM

## 2023-01-18 ENCOUNTER — Ambulatory Visit (INDEPENDENT_AMBULATORY_CARE_PROVIDER_SITE_OTHER): Payer: PPO | Admitting: Family Medicine

## 2023-01-18 ENCOUNTER — Encounter: Payer: Self-pay | Admitting: Family Medicine

## 2023-01-18 DIAGNOSIS — Z9889 Other specified postprocedural states: Secondary | ICD-10-CM

## 2023-01-18 DIAGNOSIS — I10 Essential (primary) hypertension: Secondary | ICD-10-CM

## 2023-01-18 DIAGNOSIS — M5136 Other intervertebral disc degeneration, lumbar region: Secondary | ICD-10-CM

## 2023-01-18 DIAGNOSIS — M5090 Cervical disc disorder, unspecified, unspecified cervical region: Secondary | ICD-10-CM | POA: Diagnosis not present

## 2023-01-18 DIAGNOSIS — M5134 Other intervertebral disc degeneration, thoracic region: Secondary | ICD-10-CM

## 2023-01-18 DIAGNOSIS — E785 Hyperlipidemia, unspecified: Secondary | ICD-10-CM | POA: Diagnosis not present

## 2023-01-18 DIAGNOSIS — G8929 Other chronic pain: Secondary | ICD-10-CM | POA: Diagnosis not present

## 2023-01-18 MED ORDER — ESCITALOPRAM OXALATE 20 MG PO TABS: 20.0000 mg | ORAL_TABLET | Freq: Every day | ORAL | 1 refills | Status: DC

## 2023-01-18 MED ORDER — LISINOPRIL 10 MG PO TABS: 10.0000 mg | ORAL_TABLET | Freq: Every day | ORAL | 1 refills | Status: DC

## 2023-01-18 MED ORDER — SIMVASTATIN 10 MG PO TABS: 10.0000 mg | ORAL_TABLET | ORAL | 1 refills | Status: DC

## 2023-01-18 MED ORDER — BACLOFEN 20 MG PO TABS
10.0000 mg | ORAL_TABLET | Freq: Three times a day (TID) | ORAL | 1 refills | Status: DC
Start: 2023-01-18 — End: 2023-05-29

## 2023-01-18 MED ORDER — OXYCODONE-ACETAMINOPHEN 10-325 MG PO TABS: 1.0000 | ORAL_TABLET | Freq: Three times a day (TID) | ORAL | 0 refills | Status: DC | PRN

## 2023-01-18 MED ORDER — GABAPENTIN 600 MG PO TABS: 600.0000 mg | ORAL_TABLET | Freq: Three times a day (TID) | ORAL | 1 refills | Status: DC

## 2023-01-18 MED ORDER — HYDROCHLOROTHIAZIDE 25 MG PO TABS: 25.0000 mg | ORAL_TABLET | Freq: Every day | ORAL | 1 refills | Status: DC

## 2023-01-18 MED ORDER — AMLODIPINE BESYLATE 10 MG PO TABS: 10.0000 mg | ORAL_TABLET | Freq: Every day | ORAL | 1 refills | Status: DC

## 2023-01-18 NOTE — Patient Instructions (Addendum)
Return in about 11 weeks (around 04/05/2023) for Routine chronic condition follow-up.        Great to see you today.  I have refilled the medication(s) we provide.   If labs were collected, we will inform you of lab results once received either by echart message or telephone call.   - echart message- for normal results that have been seen by the patient already.   - telephone call: abnormal results or if patient has not viewed results in their echart.

## 2023-01-18 NOTE — Progress Notes (Unsigned)
Patient ID: Nicholas Graham, male  DOB: [birth year], 72 y.o.   MRN: [id number] Patient Care Team    Relationship Specialty Notifications Start End  Natalia Leatherwood, DO PCP - General Family Medicine  10/31/15   Louis Meckel, MD (Inactive) Consulting Physician Gastroenterology  04/24/16   Eldred Manges, MD Consulting Physician Orthopedic Surgery  10/26/21     Chief Complaint  Patient presents with   Hypertension    Subjective:  Nicholas Graham is a 72 y.o. male present for Chronic medical condition appointment All past medical history, surgical history, allergies, family history, immunizations, medications and social history were updated in the electronic medical record today. All recent labs, ED visits and hospitalizations within the last year were reviewed.  Cervical neck pain with evidence of disc disease/Chronic midline thoracic back pain/Thoracic degenerative disc disease/DDD (degenerative disc disease), lumbar/History of lumbar discectomy Unfortunately patient has suffered from a thoracic compression fracture in which she has been followed by orthopedics.  He is wearing a back brace today. Pt presents for his chronic pain management treatment for his cervical and lumbar conditions. His discomfort has progressed since 2020-he feels he has some  relief from current regimen. He has been evaluated  by neuro and pain rehab.  He has received injections- that have only provided a few days of temporary relief in the past . He is frustrated, but has appropriate expectations that he will be living with some pain.  He reports he is maintaining a quality of life and able to remain active with Percocet 10-325 (1 tab every 8-12 hours), baclofen and gabapentin. 600/600/600 (had headaches at increased doses). He states he is walking a few miles a day.  He was able to do his walk today. He is established with  Dr. Ophelia Charter to discuss MRI and surgical procedure for his neck pain. Indication for chronic  opioid: Cervical spine degeneration, chronic neck and back pain. Medication and dose: Oxycodone-acetaminophen 10-325 1 tab TID prn daily # pills per month: 60 Last UDS date:UYD 06/2021 Pain contract signed (Y/N): Yes- UTD 08/2021 Date narcotic database last reviewed (/include red flags): 02/15/22 03/08/2021  had epidural injection of cervical spine and he feels it is helpful.  11/2021-has had repeat injections that have not been as helpful. Original note: Lower cervical to mid thoracic pain still present. Using his right arm exacerbates his pain midline of thoracic.  He has a history thoracic spine degeneration and discectomy in his lumbar spine.  Has degenerative changes, bone spurring and facet arthropathy in his lumbar spine by x-rays obtained 11/2018. Cervical spine x-ray with mild bilateral foraminal narrowing at C3-C4 11/2018. Unfortunately he was reported as allergic or intolerant to many of the medications that would be helpful for him such as gabapentin, Lyrica, Celebrex and Cymbalta --> we discussed this in more detail last visit and he believes that the "allergy-hallucinations " was secondary to having multiple of these medicines on board at the same time back in 2014. We tried to start gabapentin and it did not have any side effects from medication. It has not been extremely helpful  Yet at lower doses. He admits to rather sigmnificant stomach upset starting from the naproxen BID use ober the last 8 week. Advanced imaging has not bee able to be completed 2/2 to covid outbreak. He desired to wait on referral to specialist last visit in hope that he would receive benefit from the gabapentin.   Hypertension/HLD/overweight: Pt reports compliance with amlodipine  10 mg daily, lisinopril 10 mg QD and HCTZ.. Patient denies chest pain, shortness of breath, dizziness or lower extremity edema.   extremity edema.  Pt takes a daily baby ASA. Pt is  prescribed statin.  Labs UTD Diet:  Low-sodium Exercise: Not exercising as routinely. RF: Hypertension, hyperlipidemia, family history of heart disease. Overweight.   ANXIETY/sleep disorder:  Patient reports he is feeling well on Lexapro 20 mg daily and would like to continue. Elavil  low dose started last appt he feels overall it is working well.  He does not feel any extra sedation throughout his day from medication.   Gerd: Patient reports he is still requiring PPI daily use in order to avoid symptoms.      01/18/2023   10:26 AM 11/13/2022    8:16 AM 06/15/2022    1:06 PM 11/30/2021    8:30 AM 06/21/2021    8:07 AM  Depression screen PHQ 2/9  Decreased Interest 0 0 0 0 0  Down, Depressed, Hopeless 0 0 0 0 0  PHQ - 2 Score 0 0 0 0 0  Altered sleeping    1   Tired, decreased energy    0   Change in appetite    1   Feeling bad or failure about yourself     0   Trouble concentrating    0   Moving slowly or fidgety/restless    0   Suicidal thoughts    0   PHQ-9 Score    2       11/30/2021    8:32 AM 01/14/2020    8:20 AM 12/16/2019    8:06 AM 06/23/2019    8:30 AM  GAD 7 : Generalized Anxiety Score  Nervous, Anxious, on Edge 0 0 0 0  Control/stop worrying 0 0 0 0  Worry too much - different things 0 0 0 0  Trouble relaxing 0 3 0 0  Restless 0 3 3 1   Easily annoyed or irritable 0 1 0 0  Afraid - awful might happen 0 0 0 0  Total GAD 7 Score 0 7 3 1   Anxiety Difficulty  Not difficult at all Somewhat difficult Not difficult at all            01/18/2023   10:26 AM 11/09/2022   10:50 AM 06/15/2022    1:06 PM 09/09/2021    9:24 AM 06/21/2021    8:07 AM  Fall Risk   Falls in the past year? 0 0 0 0 0  Number falls in past yr: 0  0  0  Injury with Fall? 0  0  0  Risk for fall due to :   No Fall Risks    Follow up Falls evaluation completed  Falls evaluation completed  Falls evaluation completed     There is no immunization history on file for this patient.   Past Medical History:  Diagnosis Date   Arm  fracture    left. Dx'd w/RSD post fracture   Arthritis    Carpal tunnel syndrome    Chest pain 2016   [facility] (Dr. Marcelle Overlie) doing stress echo, but feels like musculoskeletal chest wall pain is cause of his discomfort   Chronic back pain    COVID-19 06/2020   Deviated septum    Diverticulosis    GERD (gastroesophageal reflux disease)    Hyperlipidemia    Hypertension    Joint pain    Muscle pain    OSA on  CPAP 07/04/2018   - Trial of CPAP therapy on 17 cm H2O with a Large size [employer] Full Face Mask Simplus mask and heated humidification.   Plantar fasciitis    Pulmonary nodule 04/2016   [place].  Pt chose to repeat CT chest w/out contrast in 3 mo---as per radiologist's recommendations.   Right inguinal hernia    fat-containing.  Small hydrocele in left hemiscrotum.   Sleep apnea    Snoring    Swelling    of legs/feet/hands   Tobacco dependence in remission    Allergies  Allergen Reactions   Penicillins Hives   Celebrex [Celecoxib]    Cymbalta [Duloxetine Hcl]    Lyrica [Pregabalin]    Past Surgical History:  Procedure Laterality Date   COLONOSCOPY     fracture repair left arm     had a fixator   INGUINAL HERNIA REPAIR Left 2013   repeat hernia repair. x2   KNEE ARTHROSCOPY Right    LUMBAR DISC SURGERY  1993   NEURECTOMY inguinal after hernia repair Left    PLANTAR FASCIA SURGERY Right 2000s   SHOULDER ARTHROSCOPY Right    UPPER GI ENDOSCOPY     Family History  Problem Relation Age of Onset   Lung cancer Mother    Lung cancer Father    Bone cancer Brother        bone   Heart disease Maternal Uncle    Colon cancer Neg Hx    Esophageal cancer Neg Hx    Stomach cancer Neg Hx    Social History   Social History Narrative   Married, Designer, television/film set. Children (2) Adult, 4 grandchildren.    9 th grade, Retired.   Wears seatbelt   Smoke detector in the home.    Firearms locked in the home.    Feels safe in his relationships.     Allergies as of  01/18/2023       Reactions   Penicillins Hives   Celebrex [celecoxib]    Cymbalta [duloxetine Hcl]    Lyrica [pregabalin]         Medication List        Accurate as of Jan 18, 2023 11:59 PM. If you have any questions, ask your nurse or doctor.          STOP taking these medications    predniSONE 20 MG tablet Commonly known as: DELTASONE Stopped by: Felix Pacini, DO       TAKE these medications    amLODipine 10 MG tablet Commonly known as: NORVASC Take 1 tablet (10 mg total) by mouth daily.   aspirin 81 MG tablet Take 81 mg by mouth daily.   baclofen 20 MG tablet Commonly known as: LIORESAL Take 0.5-1 tablets (10-20 mg total) by mouth 3 (three) times daily.   escitalopram 20 MG tablet Commonly known as: LEXAPRO Take 1 tablet (20 mg total) by mouth daily.   gabapentin 600 MG tablet Commonly known as: NEURONTIN Take 1 tablet (600 mg total) by mouth 3 (three) times daily. 1 tab morning, 1.5 tabs afternoon, 1 tab evening,  doses should be every 8 hours.   hydrochlorothiazide 25 MG tablet Commonly known as: HYDRODIURIL Take 1 tablet (25 mg total) by mouth daily.   lisinopril 10 MG tablet Commonly known as: ZESTRIL Take 1 tablet (10 mg total) by mouth daily.   MegaRed Omega-3 Krill Oil 500 MG Caps Take 2 capsules by mouth daily.   oxyCODONE-acetaminophen 10-325 MG tablet Commonly known as: PERCOCET Take 1  tablet by mouth every 8 (eight) hours as needed for pain. What changed: Another medication with the same name was changed. Make sure you understand how and when to take each. Changed by: Felix Pacini, DO   oxyCODONE-acetaminophen 10-325 MG tablet Commonly known as: PERCOCET Take 1 tablet by mouth every 8 (eight) hours as needed for pain. What changed: Another medication with the same name was changed. Make sure you understand how and when to take each. Changed by: Felix Pacini, DO   oxyCODONE-acetaminophen 10-325 MG tablet Commonly known as:  PERCOCET Take 1 tablet by mouth every 8 (eight) hours as needed for pain. Start taking on: Feb 13, 2023 What changed: These instructions start on Feb 13, 2023. If you are unsure what to do until then, ask your doctor or other care provider. Changed by: Felix Pacini, DO   pantoprazole 40 MG tablet Commonly known as: PROTONIX Take 1 tablet (40 mg total) by mouth daily.   simvastatin 10 MG tablet Commonly known as: ZOCOR Take 1 tablet (10 mg total) by mouth every other day.       All past medical history, surgical history, allergies, family history, immunizations andmedications were updated in the EMR today and reviewed under the history and medication portions of their EMR.     ROS 14 pt review of systems performed and negative (unless mentioned in an HPI)  Objective: BP 114/77   Pulse 97   Temp 98 F (36.7 C)   Wt 196 lb (88.9 kg)   SpO2 96%   BMI 25.86 kg/m  Physical Exam Vitals and nursing note reviewed.  Constitutional:      General: He is not in acute distress.    Appearance: Normal appearance. He is not ill-appearing, toxic-appearing or diaphoretic.  HENT:     Head: Normocephalic and atraumatic.  Eyes:     General: No scleral icterus.       Right eye: No discharge.        Left eye: No discharge.     Extraocular Movements: Extraocular movements intact.     Pupils: Pupils are equal, round, and reactive to light.  Cardiovascular:     Rate and Rhythm: Normal rate and regular rhythm.  Pulmonary:     Effort: Pulmonary effort is normal. No respiratory distress.     Breath sounds: Normal breath sounds. No wheezing, rhonchi or rales.  Musculoskeletal:     Cervical back: Neck supple.     Right lower leg: No edema.     Left lower leg: No edema.  Lymphadenopathy:     Cervical: No cervical adenopathy.  Skin:    General: Skin is warm and dry.     Coloration: Skin is not jaundiced or pale.     Findings: No rash.  Neurological:     Mental Status: He is alert and  oriented to person, place, and time. Mental status is at baseline.  Psychiatric:        Mood and Affect: Mood normal.        Behavior: Behavior normal.        Thought Content: Thought content normal.        Judgment: Judgment normal.    No results found.  Assessment/plan: JACQUELINE HOLBROOK is a 72 y.o. male present for routine chronic conditions Cervical neck pain with evidence of disc disease/Chronic midline thoracic back pain/Thoracic degenerative disc disease/DDD (degenerative disc disease), lumbar/History of lumbar discectomy -Unfortunately, currently he is suffering from an acute thoracic compression fraction as well.  He is following with sports medicine, physical therapy and recently had  encounter every 3 months for pain management -Getting a quality life with pain management. -[place] controlled substance database reviewed and appropriate today - Pain contract signed > updated 09/12/2022 - UDS > > due next visit -Patient discontinued Mobic. -Continue gabapentin  600/600/600> did not tolerate higher dose (headache) -Continue baclofen 10-20 mg 3 times daily -Continue percocet 10-325 mg tID #90 prescribed with 2 refills (held) at pharmacy.  - f/u 3 mos   Essential hypertension, benign/dyslipidemia/overweight Stable Continue amlodipine 10 mg daily  Continue HCTZ. Continue lisinopril 10 mg daily Continue Zocor 10 mg q. OD - continue fish oil. - continue ASA 81 mg - routine diet and exercise. Labs due in September 2024 F/u q 3 mos with other conditions.    Gastroesophageal reflux disease with esophagitis Stable continue Protonix PRN   Anxiety/sleep disturbance:  Elavil has been discontinued off his med list? Continue lexapro 20 mg qd.   OSA on CPAP compliant  Return in about 11 weeks (around 04/05/2023) for Routine chronic condition follow-up.   No orders of the defined types were placed in this encounter.  Meds ordered this encounter  Medications    amLODipine (NORVASC) 10 MG tablet    Sig: Take 1 tablet (10 mg total) by mouth daily.    Dispense:  90 tablet    Refill:  1   baclofen (LIORESAL) 20 MG tablet    Sig: Take 0.5-1 tablets (10-20 mg total) by mouth 3 (three) times daily.    Dispense:  270 tablet    Refill:  1   gabapentin (NEURONTIN) 600 MG tablet    Sig: Take 1 tablet (600 mg total) by mouth 3 (three) times daily. 1 tab morning, 1.5 tabs afternoon, 1 tab evening,  doses should be every 8 hours.    Dispense:  270 tablet    Refill:  1    Pt reports not true allergy- was a combination of meds that caused symptoms.   hydrochlorothiazide (HYDRODIURIL) 25 MG tablet    Sig: Take 1 tablet (25 mg total) by mouth daily.    Dispense:  90 tablet    Refill:  1   lisinopril (ZESTRIL) 10 MG tablet    Sig: Take 1 tablet (10 mg total) by mouth daily.    Dispense:  90 tablet    Refill:  1   oxyCODONE-acetaminophen (PERCOCET) 10-325 MG tablet    Sig: Take 1 tablet by mouth every 8 (eight) hours as needed for pain.    Dispense:  90 tablet    Refill:  0   oxyCODONE-acetaminophen (PERCOCET) 10-325 MG tablet    Sig: Take 1 tablet by mouth every 8 (eight) hours as needed for pain.    Dispense:  90 tablet    Refill:  0   oxyCODONE-acetaminophen (PERCOCET) 10-325 MG tablet    Sig: Take 1 tablet by mouth every 8 (eight) hours as needed for pain.    Dispense:  90 tablet    Refill:  0    May fill ~55 days after prescribed date   simvastatin (ZOCOR) 10 MG tablet    Sig: Take 1 tablet (10 mg total) by mouth every other day.    Dispense:  45 tablet    Refill:  1   escitalopram (LEXAPRO) 20 MG tablet    Sig: Take 1 tablet (20 mg total) by mouth daily.    Dispense:  90 tablet    Refill:  1  Referral Orders  No referral(s) requested today     Note is dictated utilizing voice recognition software. Although note has been proof read prior to signing, occasional typographical errors still can be missed. If any questions arise, please do not  hesitate to call for verification.  Electronically signed by: Felix Pacini, DO [facility] [place]

## 2023-01-23 ENCOUNTER — Ambulatory Visit
Admission: RE | Admit: 2023-01-23 | Discharge: 2023-01-23 | Disposition: A | Discharge status: Home / Self Care | Payer: PPO | Source: Ambulatory Visit | Attending: Family Medicine | Admitting: Family Medicine

## 2023-01-23 DIAGNOSIS — M48061 Spinal stenosis, lumbar region without neurogenic claudication: Secondary | ICD-10-CM | POA: Diagnosis not present

## 2023-01-23 DIAGNOSIS — M545 Low back pain, unspecified: Secondary | ICD-10-CM

## 2023-01-23 DIAGNOSIS — M549 Dorsalgia, unspecified: Secondary | ICD-10-CM | POA: Diagnosis not present

## 2023-01-25 DIAGNOSIS — M545 Low back pain, unspecified: Secondary | ICD-10-CM | POA: Diagnosis not present

## 2023-01-26 ENCOUNTER — Other Ambulatory Visit: Payer: PPO

## 2023-01-29 ENCOUNTER — Ambulatory Visit: Payer: PPO | Admitting: Family Medicine

## 2023-02-15 DIAGNOSIS — M545 Low back pain, unspecified: Secondary | ICD-10-CM | POA: Diagnosis not present

## 2023-02-21 ENCOUNTER — Other Ambulatory Visit: Payer: Self-pay | Admitting: Family Medicine

## 2023-02-21 DIAGNOSIS — E785 Hyperlipidemia, unspecified: Secondary | ICD-10-CM

## 2023-02-22 ENCOUNTER — Telehealth: Payer: Self-pay | Admitting: Family Medicine

## 2023-02-22 DIAGNOSIS — M546 Pain in thoracic spine: Secondary | ICD-10-CM | POA: Diagnosis not present

## 2023-02-22 MED ORDER — OXYCODONE-ACETAMINOPHEN 10-325 MG PO TABS: 1.0000 | ORAL_TABLET | Freq: Three times a day (TID) | ORAL | 0 refills | Status: DC | PRN

## 2023-02-22 NOTE — Telephone Encounter (Signed)
Please call [facility] and cancel the 3 oxy prescriptions that are there. Please make patient aware we called and these prescriptions to [facility] for him.

## 2023-02-22 NOTE — Telephone Encounter (Signed)
Pharmacy is [facility] in [place]. His medication has been sent to the wrong pharmacy.  Please send oxyCODONE-acetaminophen (PERCOCET) 10-325 MG table  to [facility]  in [place].

## 2023-02-22 NOTE — Telephone Encounter (Signed)
Spoke with patient regarding results/recommendations.  

## 2023-02-26 ENCOUNTER — Other Ambulatory Visit: Payer: Self-pay | Admitting: Orthopedic Surgery

## 2023-02-26 DIAGNOSIS — M546 Pain in thoracic spine: Secondary | ICD-10-CM

## 2023-02-27 ENCOUNTER — Ambulatory Visit
Admission: RE | Admit: 2023-02-27 | Discharge: 2023-02-27 | Disposition: A | Discharge status: Home / Self Care | Payer: PPO | Source: Ambulatory Visit | Attending: Orthopedic Surgery | Admitting: Orthopedic Surgery

## 2023-02-27 DIAGNOSIS — M546 Pain in thoracic spine: Secondary | ICD-10-CM

## 2023-02-27 DIAGNOSIS — M4854XD Collapsed vertebra, not elsewhere classified, thoracic region, subsequent encounter for fracture with routine healing: Secondary | ICD-10-CM | POA: Diagnosis not present

## 2023-02-27 DIAGNOSIS — M47814 Spondylosis without myelopathy or radiculopathy, thoracic region: Secondary | ICD-10-CM | POA: Diagnosis not present

## 2023-03-20 DIAGNOSIS — N5201 Erectile dysfunction due to arterial insufficiency: Secondary | ICD-10-CM | POA: Diagnosis not present

## 2023-03-20 DIAGNOSIS — R3912 Poor urinary stream: Secondary | ICD-10-CM | POA: Diagnosis not present

## 2023-03-20 DIAGNOSIS — N401 Enlarged prostate with lower urinary tract symptoms: Secondary | ICD-10-CM | POA: Diagnosis not present

## 2023-03-20 DIAGNOSIS — R351 Nocturia: Secondary | ICD-10-CM | POA: Diagnosis not present

## 2023-04-02 ENCOUNTER — Other Ambulatory Visit: Payer: Self-pay

## 2023-04-02 DIAGNOSIS — M8008XA Age-related osteoporosis with current pathological fracture, vertebra(e), initial encounter for fracture: Secondary | ICD-10-CM | POA: Diagnosis not present

## 2023-04-08 ENCOUNTER — Other Ambulatory Visit: Payer: Self-pay | Admitting: Family Medicine

## 2023-04-08 DIAGNOSIS — K2100 Gastro-esophageal reflux disease with esophagitis, without bleeding: Secondary | ICD-10-CM

## 2023-04-09 ENCOUNTER — Ambulatory Visit (INDEPENDENT_AMBULATORY_CARE_PROVIDER_SITE_OTHER): Payer: PPO | Admitting: Family Medicine

## 2023-04-09 ENCOUNTER — Encounter: Payer: Self-pay | Admitting: Family Medicine

## 2023-04-09 VITALS — BP 97/55 | HR 96 | Temp 98.1°F | Wt 196.8 lb

## 2023-04-09 DIAGNOSIS — K2100 Gastro-esophageal reflux disease with esophagitis, without bleeding: Secondary | ICD-10-CM

## 2023-04-09 DIAGNOSIS — Z0289 Encounter for other administrative examinations: Secondary | ICD-10-CM

## 2023-04-09 DIAGNOSIS — I10 Essential (primary) hypertension: Secondary | ICD-10-CM | POA: Diagnosis not present

## 2023-04-09 DIAGNOSIS — G8929 Other chronic pain: Secondary | ICD-10-CM

## 2023-04-09 DIAGNOSIS — M5090 Cervical disc disorder, unspecified, unspecified cervical region: Secondary | ICD-10-CM | POA: Diagnosis not present

## 2023-04-09 DIAGNOSIS — E785 Hyperlipidemia, unspecified: Secondary | ICD-10-CM | POA: Diagnosis not present

## 2023-04-09 DIAGNOSIS — M5134 Other intervertebral disc degeneration, thoracic region: Secondary | ICD-10-CM

## 2023-04-09 DIAGNOSIS — Z9889 Other specified postprocedural states: Secondary | ICD-10-CM

## 2023-04-09 DIAGNOSIS — M5136 Other intervertebral disc degeneration, lumbar region: Secondary | ICD-10-CM

## 2023-04-09 DIAGNOSIS — M67813 Other specified disorders of tendon, right shoulder: Secondary | ICD-10-CM

## 2023-04-09 DIAGNOSIS — F419 Anxiety disorder, unspecified: Secondary | ICD-10-CM

## 2023-04-09 DIAGNOSIS — M4802 Spinal stenosis, cervical region: Secondary | ICD-10-CM | POA: Diagnosis not present

## 2023-04-09 DIAGNOSIS — M51369 Other intervertebral disc degeneration, lumbar region without mention of lumbar back pain or lower extremity pain: Secondary | ICD-10-CM

## 2023-04-09 MED ORDER — OXYCODONE-ACETAMINOPHEN 10-325 MG PO TABS: 1.0000 | ORAL_TABLET | Freq: Three times a day (TID) | ORAL | 0 refills | Status: DC | PRN

## 2023-04-09 MED ORDER — PANTOPRAZOLE SODIUM 40 MG PO TBEC
40.0000 mg | DELAYED_RELEASE_TABLET | Freq: Every day | ORAL | 3 refills | Status: DC
Start: 2023-04-09 — End: 2023-06-27

## 2023-04-09 NOTE — Patient Instructions (Addendum)
Return in about 3 months (around 07/01/2023) for cpe (40 min), Routine chronic condition follow-up.        Great to see you today.  I have refilled the medication(s) we provide.   If labs were collected, we will inform you of lab results once received either by echart message or telephone call.   - echart message- for normal results that have been seen by the patient already.   - telephone call: abnormal results or if patient has not viewed results in their echart.

## 2023-04-09 NOTE — Progress Notes (Signed)
Patient ID: Nicholas Graham, male  DOB: [birth year], 72 y.o.   MRN: [id number] Patient Care Team    Relationship Specialty Notifications Start End  Natalia Leatherwood, DO PCP - General Family Medicine  10/31/15   Louis Meckel, MD (Inactive) Consulting Physician Gastroenterology  04/24/16   Eldred Manges, MD Consulting Physician Orthopedic Surgery  10/26/21     Chief Complaint  Patient presents with   Hypertension    Subjective:  Nicholas Graham is a 72 y.o. male present for Chronic medical condition appointment All past medical history, surgical history, allergies, family history, immunizations, medications and social history were updated in the electronic medical record today. All recent labs, ED visits and hospitalizations within the last year were reviewed.  Cervical neck pain with evidence of disc disease/Chronic midline thoracic back pain/Thoracic degenerative disc disease/DDD (degenerative disc disease), lumbar/History of lumbar discectomy Unfortunately patient has suffered from a thoracic compression fracture in which she has been followed by orthopedics. Recent surgery -thoracic decompression. He states it helped a great dealHe is wearing a back brace today. Pt presents for his chronic pain management treatment for his cervical and lumbar conditions. His discomfort has progressed since 2020-he feels he has some  relief from current regimen. He has been evaluated  by neuro and pain rehab.  He has received injections- that have only provided a few days of temporary relief in the past . He is frustrated, but has appropriate expectations that he will be living with some pain.  He reports quality of life improvement and he is able to at least keep his activity level increased with Percocet 10-325 (1 tab every 8-12 hours), baclofen and gabapentin. 600/600/600 (had headaches at increased doses). He states he is walking a few miles a day.  He is established with  Dr. Ophelia Charter to discuss MRI  and surgical procedure for his neck pain. Indication for chronic opioid: Cervical spine degeneration, chronic neck and back pain. Medication and dose: Oxycodone-acetaminophen 10-325 1 tab TID prn daily # pills per month: 60 Last UDS date: Collected today Pain contract signed (Y/N): Yes-  Date narcotic database last reviewed (/include red flags): 04/09/23  03/08/2021  had epidural injection of cervical spine and he feels it is helpful.  11/2021-has had repeat injections that have not been as helpful.  Original note: Lower cervical to mid thoracic pain still present. Using his right arm exacerbates his pain midline of thoracic.  He has a history thoracic spine degeneration and discectomy in his lumbar spine.  Has degenerative changes, bone spurring and facet arthropathy in his lumbar spine by x-rays obtained 11/2018. Cervical spine x-ray with mild bilateral foraminal narrowing at C3-C4 11/2018. Unfortunately he was reported as allergic or intolerant to many of the medications that would be helpful for him such as gabapentin, Lyrica, Celebrex and Cymbalta --> we discussed this in more detail last visit and he believes that the "allergy-hallucinations " was secondary to having multiple of these medicines on board at the same time back in 2014. We tried to start gabapentin and it did not have any side effects from medication. It has not been extremely helpful  Yet at lower doses. He admits to rather sigmnificant stomach upset starting from the naproxen BID use ober the last 8 week. Advanced imaging has not bee able to be completed 2/2 to covid outbreak. He desired to wait on referral to specialist last visit in hope that he would receive benefit from the gabapentin.  Hypertension/HLD/overweight: Pt reports compliance with amlodipine 10 mg daily, lisinopril 10 mg QD and HCTZ 5 mg daily.  She is compliant with Zocor 10 mg every other nightly Patient denies chest pain, shortness of breath, dizziness or lower  extremity edema.   Pt takes a daily baby ASA. Pt is  prescribed statin.  Labs UTD Diet: Low-sodium Exercise: Not exercising as routinely. RF: Hypertension, hyperlipidemia, family history of heart disease. Overweight.   ANXIETY/sleep disorder:  Patient reports compliance with Lexapro 20 mg a day and he feels that his helping.  Elavil  low dose started last appt he feels overall it is working well.  He does not feel any extra sedation throughout his day from medication.   Gerd: Patient reports he is still requiring PPI as needed-Protonix 40      01/18/2023   10:26 AM 11/13/2022    8:16 AM 06/15/2022    1:06 PM 11/30/2021    8:30 AM 06/21/2021    8:07 AM  Depression screen PHQ 2/9  Decreased Interest 0 0 0 0 0  Down, Depressed, Hopeless 0 0 0 0 0  PHQ - 2 Score 0 0 0 0 0  Altered sleeping    1   Tired, decreased energy    0   Change in appetite    1   Feeling bad or failure about yourself     0   Trouble concentrating    0   Moving slowly or fidgety/restless    0   Suicidal thoughts    0   PHQ-9 Score    2       11/30/2021    8:32 AM 01/14/2020    8:20 AM 12/16/2019    8:06 AM 06/23/2019    8:30 AM  GAD 7 : Generalized Anxiety Score  Nervous, Anxious, on Edge 0 0 0 0  Control/stop worrying 0 0 0 0  Worry too much - different things 0 0 0 0  Trouble relaxing 0 3 0 0  Restless 0 3 3 1   Easily annoyed or irritable 0 1 0 0  Afraid - awful might happen 0 0 0 0  Total GAD 7 Score 0 7 3 1   Anxiety Difficulty  Not difficult at all Somewhat difficult Not difficult at all           01/18/2023   10:26 AM 11/09/2022   10:50 AM 06/15/2022    1:06 PM 09/09/2021    9:24 AM 06/21/2021    8:07 AM  Fall Risk   Falls in the past year? 0 0 0 0 0  Number falls in past yr: 0  0  0  Injury with Fall? 0  0  0  Risk for fall due to :   No Fall Risks    Follow up Falls evaluation completed  Falls evaluation completed  Falls evaluation completed     There is no immunization history on file  for this patient.   Past Medical History:  Diagnosis Date   Arm fracture    left. Dx'd w/RSD post fracture   Arthritis    Carpal tunnel syndrome    Chest pain 2016   [facility] (Dr. Marcelle Overlie) doing stress echo, but feels like musculoskeletal chest wall pain is cause of his discomfort   Chronic back pain    COVID-19 06/2020   Deviated septum    Diverticulosis    GERD (gastroesophageal reflux disease)    Hyperlipidemia    Hypertension  Joint pain    Muscle pain    OSA on CPAP 07/04/2018   - Trial of CPAP therapy on 17 cm H2O with a Large size [employer] Full Face Mask Simplus mask and heated humidification.   Plantar fasciitis    Pulmonary nodule 04/2016   Ling[place].  Pt chose to repeat CT chest w/out contrast in 3 mo---as per radiologist's recommendations.   Right inguinal hernia    fat-containing.  Small hydrocele in left hemiscrotum.   Sleep apnea    Snoring    Swelling    of legs/feet/hands   Tobacco dependence in remission    Allergies  Allergen Reactions   Penicillins Hives   Celebrex [Celecoxib]    Cymbalta [Duloxetine Hcl]    Lyrica [Pregabalin]    Past Surgical History:  Procedure Laterality Date   COLONOSCOPY     fracture repair left arm     had a fixator   INGUINAL HERNIA REPAIR Left 2013   repeat hernia repair. x2   KNEE ARTHROSCOPY Right    LUMBAR DISC SURGERY  1993   NEURECTOMY inguinal after hernia repair Left    PLANTAR FASCIA SURGERY Right 2000s   SHOULDER ARTHROSCOPY Right    UPPER GI ENDOSCOPY     Family History  Problem Relation Age of Onset   Lung cancer Mother    Lung cancer Father    Bone cancer Brother        bone   Heart disease Maternal Uncle    Colon cancer Neg Hx    Esophageal cancer Neg Hx    Stomach cancer Neg Hx    Social History   Social History Narrative   Married, Designer, television/film set. Children (2) Adult, 4 grandchildren.    9 th grade, Retired.   Wears seatbelt   Smoke detector in the home.    Firearms locked in  the home.    Feels safe in his relationships.     Allergies as of 04/09/2023       Reactions   Penicillins Hives   Celebrex [celecoxib]    Cymbalta [duloxetine Hcl]    Lyrica [pregabalin]         Medication List        Accurate as of April 09, 2023  8:21 AM. If you have any questions, ask your nurse or doctor.          STOP taking these medications    HYDROcodone-acetaminophen 5-325 MG tablet Commonly known as: NORCO/VICODIN Stopped by: Felix Pacini   meloxicam 15 MG tablet Commonly known as: MOBIC Stopped by: Felix Pacini   methocarbamol 500 MG tablet Commonly known as: ROBAXIN Stopped by: Felix Pacini   tamsulosin 0.4 MG Caps capsule Commonly known as: FLOMAX Stopped by: Felix Pacini       TAKE these medications    amLODipine 10 MG tablet Commonly known as: NORVASC Take 1 tablet (10 mg total) by mouth daily.   aspirin 81 MG tablet Take 81 mg by mouth daily.   baclofen 20 MG tablet Commonly known as: LIORESAL Take 0.5-1 tablets (10-20 mg total) by mouth 3 (three) times daily.   calcitonin (salmon) 200 UNIT/ACT nasal spray Commonly known as: MIACALCIN/FORTICAL SMARTSIG:Both Nares   escitalopram 20 MG tablet Commonly known as: LEXAPRO Take 1 tablet (20 mg total) by mouth daily.   gabapentin 600 MG tablet Commonly known as: NEURONTIN Take 1 tablet (600 mg total) by mouth 3 (three) times daily. 1 tab morning, 1.5 tabs afternoon, 1 tab evening,  doses should  be every 8 hours.   hydrochlorothiazide 25 MG tablet Commonly known as: HYDRODIURIL Take 1 tablet (25 mg total) by mouth daily.   lisinopril 10 MG tablet Commonly known as: ZESTRIL Take 1 tablet (10 mg total) by mouth daily.   MegaRed Omega-3 Krill Oil 500 MG Caps Take 2 capsules by mouth daily.   oxyCODONE-acetaminophen 10-325 MG tablet Commonly known as: PERCOCET Take 1 tablet by mouth every 8 (eight) hours as needed for pain. What changed: Another medication with the same name was  changed. Make sure you understand how and when to take each. Changed by: Felix Pacini   oxyCODONE-acetaminophen 10-325 MG tablet Commonly known as: PERCOCET Take 1 tablet by mouth every 8 (eight) hours as needed for pain. Start taking on: May 06, 2023 What changed: These instructions start on May 06, 2023. If you are unsure what to do until then, ask your doctor or other care provider. Changed by: Felix Pacini   oxyCODONE-acetaminophen 10-325 MG tablet Commonly known as: PERCOCET Take 1 tablet by mouth every 8 (eight) hours as needed for pain. Start taking on: June 03, 2023 What changed: These instructions start on June 03, 2023. If you are unsure what to do until then, ask your doctor or other care provider. Changed by: Felix Pacini   pantoprazole 40 MG tablet Commonly known as: PROTONIX Take 1 tablet (40 mg total) by mouth daily.   simvastatin 10 MG tablet Commonly known as: ZOCOR Take 1 tablet (10 mg total) by mouth every other day.       All past medical history, surgical history, allergies, family history, immunizations andmedications were updated in the EMR today and reviewed under the history and medication portions of their EMR.     ROS 14 pt review of systems performed and negative (unless mentioned in an HPI)  Objective: BP (!) 97/55   Pulse 96   Temp 98.1 F (36.7 C)   Wt 196 lb 12.8 oz (89.3 kg)   SpO2 96%   BMI 25.96 kg/m  Physical Exam Vitals and nursing note reviewed.  Constitutional:      General: He is not in acute distress.    Appearance: Normal appearance. He is not ill-appearing, toxic-appearing or diaphoretic.  HENT:     Head: Normocephalic and atraumatic.  Eyes:     General: No scleral icterus.       Right eye: No discharge.        Left eye: No discharge.     Extraocular Movements: Extraocular movements intact.     Pupils: Pupils are equal, round, and reactive to light.  Cardiovascular:     Rate and Rhythm: Normal rate and  regular rhythm.  Pulmonary:     Effort: Pulmonary effort is normal. No respiratory distress.     Breath sounds: Normal breath sounds. No wheezing, rhonchi or rales.  Musculoskeletal:        General: Tenderness present.     Right lower leg: No edema.     Left lower leg: No edema.     Comments: Decreased range of motion cervical spine, thoracic spine, lumbar spine and shoulders.  Skin:    General: Skin is warm and dry.     Coloration: Skin is not jaundiced or pale.     Findings: No rash.  Neurological:     Mental Status: He is alert and oriented to person, place, and time. Mental status is at baseline.  Psychiatric:        Mood and Affect: Mood  normal.        Behavior: Behavior normal.        Thought Content: Thought content normal.        Judgment: Judgment normal.    No results found.  Assessment/plan: Nicholas Graham is a 72 y.o. male present for routine chronic conditions Cervical neck pain with evidence of disc disease/Chronic midline thoracic back pain/Thoracic degenerative disc disease/DDD (degenerative disc disease), lumbar/History of lumbar discectomy -Unfortunately, currently he is suffering from an acute thoracic compression fraction as well. He is following with sports medicine, physical therapy, orthopedics and recently had  encounter every 3 months for pain management -Increased quality life with pain management. -[place] controlled substance database reviewed and appropriate today - Pain contract signed > updated 09/12/2022 - UDS > > due next visit at cpe -Patient discontinued Mobic. -Continue gabapentin  600/600/600> did not tolerate higher dose (headache) -Continue baclofen 10-20 mg 3 times daily -Continue percocet 10-325 mg tID #90 prescribed with 2 refills (held) at pharmacy.  - f/u 3 mos   Essential hypertension, benign/dyslipidemia/overweight Stable Continue amlodipine 10 mg daily  Continue HCTZ. Continue lisinopril 10 mg daily Continue Zocor 10  mg q. OD - continue fish oil. - continue ASA 81 mg - routine diet and exercise. Labs due in September 2024 F/u q 3 mos with other conditions.    Gastroesophageal reflux disease with esophagitis Stable continue Protonix PRN   Anxiety/sleep disturbance:  Elavil has been discontinued off his med list? Continue lexapro 20 mg qd.   OSA on CPAP Compliant  Return in about 3 months (around 07/01/2023) for cpe (40 min), Routine chronic condition follow-up.   No orders of the defined types were placed in this encounter.  Meds ordered this encounter  Medications   oxyCODONE-acetaminophen (PERCOCET) 10-325 MG tablet    Sig: Take 1 tablet by mouth every 8 (eight) hours as needed for pain.    Dispense:  90 tablet    Refill:  0   oxyCODONE-acetaminophen (PERCOCET) 10-325 MG tablet    Sig: Take 1 tablet by mouth every 8 (eight) hours as needed for pain.    Dispense:  90 tablet    Refill:  0    May fill ~55 days after prescribed date   oxyCODONE-acetaminophen (PERCOCET) 10-325 MG tablet    Sig: Take 1 tablet by mouth every 8 (eight) hours as needed for pain.    Dispense:  90 tablet    Refill:  0   pantoprazole (PROTONIX) 40 MG tablet    Sig: Take 1 tablet (40 mg total) by mouth daily.    Dispense:  90 tablet    Refill:  3   Referral Orders  No referral(s) requested today     Note is dictated utilizing voice recognition software. Although note has been proof read prior to signing, occasional typographical errors still can be missed. If any questions arise, please do not hesitate to call for verification.  Electronically signed by: Felix Pacini, DO [facility] [place]

## 2023-05-17 ENCOUNTER — Other Ambulatory Visit: Payer: Self-pay | Admitting: Family Medicine

## 2023-05-17 DIAGNOSIS — R3912 Poor urinary stream: Secondary | ICD-10-CM | POA: Diagnosis not present

## 2023-05-17 DIAGNOSIS — N401 Enlarged prostate with lower urinary tract symptoms: Secondary | ICD-10-CM | POA: Diagnosis not present

## 2023-05-17 DIAGNOSIS — R351 Nocturia: Secondary | ICD-10-CM | POA: Diagnosis not present

## 2023-05-17 DIAGNOSIS — I10 Essential (primary) hypertension: Secondary | ICD-10-CM

## 2023-05-17 DIAGNOSIS — R109 Unspecified abdominal pain: Secondary | ICD-10-CM | POA: Diagnosis not present

## 2023-05-27 ENCOUNTER — Other Ambulatory Visit: Payer: Self-pay | Admitting: Family Medicine

## 2023-05-29 ENCOUNTER — Ambulatory Visit (INDEPENDENT_AMBULATORY_CARE_PROVIDER_SITE_OTHER): Payer: PPO | Admitting: Family Medicine

## 2023-05-29 ENCOUNTER — Encounter: Payer: Self-pay | Admitting: Family Medicine

## 2023-05-29 ENCOUNTER — Ambulatory Visit: Payer: PPO | Admitting: Family Medicine

## 2023-05-29 VITALS — BP 125/77 | HR 73 | Temp 97.6°F | Wt 198.6 lb

## 2023-05-29 DIAGNOSIS — I10 Essential (primary) hypertension: Secondary | ICD-10-CM | POA: Diagnosis not present

## 2023-05-29 DIAGNOSIS — E785 Hyperlipidemia, unspecified: Secondary | ICD-10-CM | POA: Diagnosis not present

## 2023-05-29 DIAGNOSIS — R1084 Generalized abdominal pain: Secondary | ICD-10-CM

## 2023-05-29 DIAGNOSIS — Z9889 Other specified postprocedural states: Secondary | ICD-10-CM

## 2023-05-29 DIAGNOSIS — K5903 Drug induced constipation: Secondary | ICD-10-CM

## 2023-05-29 DIAGNOSIS — K5790 Diverticulosis of intestine, part unspecified, without perforation or abscess without bleeding: Secondary | ICD-10-CM

## 2023-05-29 LAB — COMPREHENSIVE METABOLIC PANEL
ALT: 13 U/L (ref 0–53)
AST: 21 U/L (ref 0–37)
Albumin: 4.5 g/dL (ref 3.5–5.2)
Alkaline Phosphatase: 64 U/L (ref 39–117)
BUN: 19 mg/dL (ref 6–23)
CO2: 30 meq/L (ref 19–32)
Calcium: 9.8 mg/dL (ref 8.4–10.5)
Chloride: 100 meq/L (ref 96–112)
Creatinine, Ser: 1.01 mg/dL (ref 0.40–1.50)
GFR: 75.69 mL/min (ref >60.00)
Glucose, Bld: 95 mg/dL (ref 70–99)
Potassium: 4.1 meq/L (ref 3.5–5.1)
Sodium: 138 meq/L (ref 135–145)
Total Bilirubin: 0.8 mg/dL (ref 0.2–1.2)
Total Protein: 6.9 g/dL (ref 6.0–8.3)

## 2023-05-29 LAB — CBC WITH DIFFERENTIAL/PLATELET
Basophils Absolute: 0 10*3/uL (ref 0.0–0.1)
Basophils Relative: 0.4 % (ref 0.0–3.0)
Eosinophils Absolute: 0.1 10*3/uL (ref 0.0–0.7)
Eosinophils Relative: 0.9 % (ref 0.0–5.0)
HCT: 48.2 % (ref 39.0–52.0)
Hemoglobin: 15.9 g/dL (ref 13.0–17.0)
Lymphocytes Relative: 30.8 % (ref 12.0–46.0)
Lymphs Abs: 1.8 10*3/uL (ref 0.7–4.0)
MCHC: 32.9 g/dL (ref 30.0–36.0)
MCV: 92.2 fl (ref 78.0–100.0)
Monocytes Absolute: 0.5 10*3/uL (ref 0.1–1.0)
Monocytes Relative: 8.8 % (ref 3.0–12.0)
Neutro Abs: 3.4 10*3/uL (ref 1.4–7.7)
Neutrophils Relative %: 59.1 % (ref 43.0–77.0)
Platelets: 163 10*3/uL (ref 150.0–400.0)
RBC: 5.23 Mil/uL (ref 4.22–5.81)
RDW: 13.5 % (ref 11.5–15.5)
WBC: 5.8 10*3/uL (ref 4.0–10.5)

## 2023-05-29 LAB — LIPASE: Lipase: 23 U/L (ref 11.0–59.0)

## 2023-05-29 LAB — C-REACTIVE PROTEIN: CRP: <1 mg/dL (ref 0.5–20.0)

## 2023-05-29 MED ORDER — SENNA-DOCUSATE SODIUM 8.6-50 MG PO TABS: 2.0000 | ORAL_TABLET | Freq: Every evening | ORAL | 1 refills | Status: DC | PRN

## 2023-05-29 MED ORDER — ESCITALOPRAM OXALATE 20 MG PO TABS: 20.0000 mg | ORAL_TABLET | Freq: Every day | ORAL | 1 refills | Status: DC

## 2023-05-29 MED ORDER — GABAPENTIN 600 MG PO TABS
600.0000 mg | ORAL_TABLET | Freq: Three times a day (TID) | ORAL | 1 refills | Status: DC
Start: 2023-05-29 — End: 2023-09-05

## 2023-05-29 MED ORDER — HYDROCHLOROTHIAZIDE 25 MG PO TABS
25.0000 mg | ORAL_TABLET | Freq: Every day | ORAL | 1 refills | Status: DC
Start: 2023-05-29 — End: 2023-09-05

## 2023-05-29 MED ORDER — LISINOPRIL 10 MG PO TABS: 10.0000 mg | ORAL_TABLET | Freq: Every day | ORAL | 1 refills | Status: DC

## 2023-05-29 MED ORDER — POLYETHYLENE GLYCOL 3350 17 GM/SCOOP PO POWD: 17.0000 g | Freq: Every day | ORAL | 1 refills | Status: DC

## 2023-05-29 MED ORDER — AMLODIPINE BESYLATE 10 MG PO TABS
10.0000 mg | ORAL_TABLET | Freq: Every day | ORAL | 1 refills | Status: DC
Start: 2023-05-29 — End: 2023-09-05

## 2023-05-29 MED ORDER — BACLOFEN 20 MG PO TABS: 10.0000 mg | ORAL_TABLET | Freq: Three times a day (TID) | ORAL | 1 refills | Status: DC

## 2023-05-29 MED ORDER — SIMVASTATIN 10 MG PO TABS
10.0000 mg | ORAL_TABLET | ORAL | 1 refills | Status: DC
Start: 2023-05-29 — End: 2023-09-05

## 2023-05-29 NOTE — Patient Instructions (Signed)

## 2023-05-29 NOTE — Progress Notes (Signed)
Nicholas Graham , 1950/09/28, 72 y.o., male MRN: [id number] Patient Care Team    Relationship Specialty Notifications Start End  Natalia Leatherwood, DO PCP - General Family Medicine  10/31/15   Louis Meckel, MD (Inactive) Consulting Physician Gastroenterology  04/24/16   Eldred Manges, MD Consulting Physician Orthopedic Surgery  10/26/21     Chief Complaint  Patient presents with   Abdominal Pain    abdominal pain and right side back pain going on 4 weeks; saw Dr Pace(urology) and was advised to see PCP     Subjective: Nicholas Graham is a 72 y.o. Pt presents for an OV with complaints of abdominal pain and right sided low back pain of 4 weeks duration.  Patient denies fevers or chills.  He states he has the majority of the pain pointing to suprapubic area and left lower quadrant, but also right lower quadrant and epigastric area are uncomfortable at times.  He is prescribed chronic opiates and reports his bowel movements are hard and difficult to pass.  He has a bowel movement daily of small marble hard stools.  He did start Dulcolax, but has not seen much change.  He reports he hydrates well drinking 6-8 bottles of water (12 ounces).  He has seen urology recently who started him on Myrbetriq for his BPH.  He is prescribed HCTZ 25 mg for his hypertension.  2017 abdominal CT normal with the exception of a tiny duodenal diverticulum with air noted behind the head of the pancreas.  Colonic diverticula of the descending colon and sigmoid colon are noted.  There was a nonspecific mild segmental thickening of the proximal sigmoid at that time, suspected chronic diverticulitis.  Small hiatal hernia present.  There was a central calcification of the prostate gland at 7 mm and a mild thickening of the urinary bladder wall.  Right inguinal scrotal canal hernia containing fat only.  Tiny left hydrocele. Colonoscopy up-to-date 09/05/2022.  X 2 polyps removed, internal hemorrhoids visualized.  Repeat  colonoscopy 7 years.  Noted mild diverticulitis in the sigmoid colon in the past.  Established with Dr. Adela Lank.  He has had inguinal hernias and anterior abdominal wall hernias in the past.     01/18/2023   10:26 AM 11/13/2022    8:16 AM 06/15/2022    1:06 PM 11/30/2021    8:30 AM 06/21/2021    8:07 AM  Depression screen PHQ 2/9  Decreased Interest 0 0 0 0 0  Down, Depressed, Hopeless 0 0 0 0 0  PHQ - 2 Score 0 0 0 0 0  Altered sleeping    1   Tired, decreased energy    0   Change in appetite    1   Feeling bad or failure about yourself     0   Trouble concentrating    0   Moving slowly or fidgety/restless    0   Suicidal thoughts    0   PHQ-9 Score    2     Allergies  Allergen Reactions   Penicillins Hives   Celebrex [Celecoxib]    Cymbalta [Duloxetine Hcl]    Lyrica [Pregabalin]    Social History   Social History Narrative   Married, [place]. Children (2) Adult, 4 grandchildren.    9 th grade, Retired.   Wears seatbelt   Smoke detector in the home.    Firearms locked in the home.    Feels safe in his relationships.  Past Medical History:  Diagnosis Date   Arm fracture    left. Dx'd w/RSD post fracture   Arthritis    Carpal tunnel syndrome    Chest pain 2016   [facility] (Dr. Marcelle Overlie) doing stress echo, but feels like musculoskeletal chest wall pain is cause of his discomfort   Chronic back pain    COVID-19 06/2020   Deviated septum    Diverticulosis    GERD (gastroesophageal reflux disease)    Hyperlipidemia    Hypertension    Joint pain    Muscle pain    OSA on CPAP 07/04/2018   - Trial of CPAP therapy on 17 cm H2O with a Large size [employer]Paykel Full Face Mask Simplus mask and heated humidification.   Plantar fasciitis    Pulmonary nodule 04/2016   [place].  Pt chose to repeat CT chest w/out contrast in 3 mo---as per radiologist's recommendations.   Right inguinal hernia    fat-containing.  Small hydrocele in left hemiscrotum.   Sleep  apnea    Snoring    Swelling    of legs/feet/hands   Tobacco dependence in remission    Past Surgical History:  Procedure Laterality Date   COLONOSCOPY     fracture repair left arm     had a fixator   INGUINAL HERNIA REPAIR Left 2013   repeat hernia repair. x2   KNEE ARTHROSCOPY Right    LUMBAR DISC SURGERY  1993   NEURECTOMY inguinal after hernia repair Left    PLANTAR FASCIA SURGERY Right 2000s   SHOULDER ARTHROSCOPY Right    UPPER GI ENDOSCOPY     Family History  Problem Relation Age of Onset   Lung cancer Mother    Lung cancer Father    Bone cancer Brother        bone   Heart disease Maternal Uncle    Colon cancer Neg Hx    Esophageal cancer Neg Hx    Stomach cancer Neg Hx    Allergies as of 05/29/2023       Reactions   Penicillins Hives   Celebrex [celecoxib]    Cymbalta [duloxetine Hcl]    Lyrica [pregabalin]         Medication List        Accurate as of May 29, 2023  9:31 AM. If you have any questions, ask your nurse or doctor.          STOP taking these medications    calcitonin (salmon) 200 UNIT/ACT nasal spray Commonly known as: MIACALCIN/FORTICAL Stopped by: Felix Pacini       TAKE these medications    amLODipine 10 MG tablet Commonly known as: NORVASC Take 1 tablet (10 mg total) by mouth daily.   aspirin 81 MG tablet Take 81 mg by mouth daily.   baclofen 20 MG tablet Commonly known as: LIORESAL Take 0.5-1 tablets (10-20 mg total) by mouth 3 (three) times daily.   escitalopram 20 MG tablet Commonly known as: LEXAPRO Take 1 tablet (20 mg total) by mouth daily.   gabapentin 600 MG tablet Commonly known as: NEURONTIN Take 1 tablet (600 mg total) by mouth 3 (three) times daily. 1 tab morning, 1.5 tabs afternoon, 1 tab evening,  doses should be every 8 hours.   hydrochlorothiazide 25 MG tablet Commonly known as: HYDRODIURIL Take 1 tablet (25 mg total) by mouth daily.   lisinopril 10 MG tablet Commonly known as:  ZESTRIL Take 1 tablet (10 mg total) by mouth daily.  MegaRed Omega-3 Krill Oil 500 MG Caps Take 2 capsules by mouth daily.   Myrbetriq 50 MG Tb24 tablet Generic drug: mirabegron ER Take 50 mg by mouth daily.   oxyCODONE-acetaminophen 10-325 MG tablet Commonly known as: PERCOCET Take 1 tablet by mouth every 8 (eight) hours as needed for pain.   oxyCODONE-acetaminophen 10-325 MG tablet Commonly known as: PERCOCET Take 1 tablet by mouth every 8 (eight) hours as needed for pain.   oxyCODONE-acetaminophen 10-325 MG tablet Commonly known as: PERCOCET Take 1 tablet by mouth every 8 (eight) hours as needed for pain. Start taking on: June 03, 2023   pantoprazole 40 MG tablet Commonly known as: PROTONIX Take 1 tablet (40 mg total) by mouth daily.   polyethylene glycol powder 17 GM/SCOOP powder Commonly known as: GLYCOLAX/MIRALAX Take 17 g by mouth daily. Started by: Felix Pacini   sennosides-docusate sodium 8.6-50 MG tablet Commonly known as: SENOKOT-S Take 2 tablets by mouth at bedtime as needed for constipation. Started by: Felix Pacini   simvastatin 10 MG tablet Commonly known as: ZOCOR Take 1 tablet (10 mg total) by mouth every other day.   tamsulosin 0.4 MG Caps capsule Commonly known as: FLOMAX Take 0.4 mg by mouth at bedtime.        All past medical history, surgical history, allergies, family history, immunizations andmedications were updated in the EMR today and reviewed under the history and medication portions of their EMR.     ROS Negative, with the exception of above mentioned in HPI   Objective:  BP 125/77   Pulse 73   Temp 97.6 F (36.4 C)   Wt 198 lb 9.6 oz (90.1 kg)   SpO2 97%   BMI 26.20 kg/m  Body mass index is 26.2 kg/m. Physical Exam Vitals and nursing note reviewed.  Constitutional:      General: He is not in acute distress.    Appearance: Normal appearance. He is not ill-appearing, toxic-appearing or diaphoretic.  HENT:      Head: Normocephalic and atraumatic.  Eyes:     General: No scleral icterus.       Right eye: No discharge.        Left eye: No discharge.     Extraocular Movements: Extraocular movements intact.     Pupils: Pupils are equal, round, and reactive to light.  Cardiovascular:     Rate and Rhythm: Normal rate and regular rhythm.  Pulmonary:     Effort: Pulmonary effort is normal. No respiratory distress.     Breath sounds: Normal breath sounds. No wheezing, rhonchi or rales.  Abdominal:     General: Abdomen is flat. Bowel sounds are increased. There is distension.     Palpations: Abdomen is soft. There is no fluid wave, hepatomegaly, splenomegaly, mass or pulsatile mass.     Tenderness: There is abdominal tenderness in the right lower quadrant, epigastric area, periumbilical area, suprapubic area and left lower quadrant. There is right CVA tenderness and guarding. There is no left CVA tenderness or rebound. Negative signs include Murphy's sign.  Skin:    General: Skin is warm.     Findings: No rash.  Neurological:     Mental Status: He is alert and oriented to person, place, and time. Mental status is at baseline.  Psychiatric:        Mood and Affect: Mood normal.        Behavior: Behavior normal.        Thought Content: Thought content normal.  Judgment: Judgment normal.     No results found. No results found. No results found for this or any previous visit (from the past 24 hour(s)).  Assessment/Plan: Nicholas Graham is a 72 y.o. male present for OV for  Generalized abdominal pain/ Drug-induced constipation/Diverticulosis Patient seems to be suffering from chronic constipation.  Cannot rule out that he is not having a diverticular flare or partial obstruction.  Therefore we will need to evaluate fully with labs and CT abdomen pelvis since pain has worsened over the last 4 weeks and he is severely tender on exam. Start Senokot-S2 tabs before bed Start MiraLAX 1 cap twice  daily If constipation does not improve on the above regimen, would consider Linzess or like medication. Urged him to continue adequate hydration - CBC w/Diff - Comp Met (CMET) - C-reactive protein - Lipase - Urinalysis w microscopic + reflex cultur - CT ABDOMEN PELVIS W CONTRAST; Future Patient will be called with all results and further treatment plan and follow-up will be discussed at that time-including the possibility of starting antibiotic versus referral to gastroenterology etc.  Essential hypertension, benign-hyperlipidemia/lumbar discectomy Corrected pharmacy error from 01/18/2023.  Many of his meds went to the wrong pharmacy.  This was corrected and these medications were resent to his [facility].  Reviewed expectations re: course of current medical issues. Discussed self-management of symptoms. Outlined signs and symptoms indicating need for more acute intervention. Patient verbalized understanding and all questions were answered. Patient received an After-Visit Summary.    Orders Placed This Encounter  Procedures   CT ABDOMEN PELVIS W CONTRAST   CBC w/Diff   Comp Met (CMET)   C-reactive protein   Lipase   Urinalysis w microscopic + reflex cultur   Meds ordered this encounter  Medications   sennosides-docusate sodium (SENOKOT-S) 8.6-50 MG tablet    Sig: Take 2 tablets by mouth at bedtime as needed for constipation.    Dispense:  180 tablet    Refill:  1   polyethylene glycol powder (GLYCOLAX/MIRALAX) 17 GM/SCOOP powder    Sig: Take 17 g by mouth daily.    Dispense:  3350 g    Refill:  1   amLODipine (NORVASC) 10 MG tablet    Sig: Take 1 tablet (10 mg total) by mouth daily.    Dispense:  90 tablet    Refill:  1   baclofen (LIORESAL) 20 MG tablet    Sig: Take 0.5-1 tablets (10-20 mg total) by mouth 3 (three) times daily.    Dispense:  270 tablet    Refill:  1   escitalopram (LEXAPRO) 20 MG tablet    Sig: Take 1 tablet (20 mg total) by mouth daily.     Dispense:  90 tablet    Refill:  1   hydrochlorothiazide (HYDRODIURIL) 25 MG tablet    Sig: Take 1 tablet (25 mg total) by mouth daily.    Dispense:  90 tablet    Refill:  1   gabapentin (NEURONTIN) 600 MG tablet    Sig: Take 1 tablet (600 mg total) by mouth 3 (three) times daily. 1 tab morning, 1.5 tabs afternoon, 1 tab evening,  doses should be every 8 hours.    Dispense:  270 tablet    Refill:  1    Pt reports not true allergy- was a combination of meds that caused symptoms.   lisinopril (ZESTRIL) 10 MG tablet    Sig: Take 1 tablet (10 mg total) by mouth daily.  Dispense:  90 tablet    Refill:  1   simvastatin (ZOCOR) 10 MG tablet    Sig: Take 1 tablet (10 mg total) by mouth every other day.    Dispense:  45 tablet    Refill:  1   Referral Orders  No referral(s) requested today     Note is dictated utilizing voice recognition software. Although note has been proof read prior to signing, occasional typographical errors still can be missed. If any questions arise, please do not hesitate to call for verification.   electronically signed by:  Felix Pacini, DO  [facility] [place]

## 2023-05-30 ENCOUNTER — Encounter (HOSPITAL_BASED_OUTPATIENT_CLINIC_OR_DEPARTMENT_OTHER): Payer: Self-pay

## 2023-05-30 ENCOUNTER — Ambulatory Visit (HOSPITAL_BASED_OUTPATIENT_CLINIC_OR_DEPARTMENT_OTHER)
Admission: RE | Admit: 2023-05-30 | Discharge: 2023-05-30 | Disposition: A | Discharge status: Home / Self Care | Payer: PPO | Source: Ambulatory Visit | Attending: Family Medicine | Admitting: Family Medicine

## 2023-05-30 DIAGNOSIS — I722 Aneurysm of renal artery: Secondary | ICD-10-CM | POA: Diagnosis not present

## 2023-05-30 DIAGNOSIS — K5750 Diverticulosis of both small and large intestine without perforation or abscess without bleeding: Secondary | ICD-10-CM | POA: Diagnosis not present

## 2023-05-30 DIAGNOSIS — R109 Unspecified abdominal pain: Secondary | ICD-10-CM | POA: Diagnosis not present

## 2023-05-30 DIAGNOSIS — N200 Calculus of kidney: Secondary | ICD-10-CM | POA: Diagnosis not present

## 2023-05-30 DIAGNOSIS — R1084 Generalized abdominal pain: Secondary | ICD-10-CM | POA: Insufficient documentation

## 2023-05-30 LAB — URINALYSIS W MICROSCOPIC + REFLEX CULTURE
Bacteria, UA: NONE SEEN /HPF
Bilirubin Urine: NEGATIVE
Glucose, UA: NEGATIVE
Hgb urine dipstick: NEGATIVE
Hyaline Cast: NONE SEEN /LPF
Ketones, ur: NEGATIVE
Leukocyte Esterase: NEGATIVE
Nitrites, Initial: NEGATIVE
Protein, ur: NEGATIVE
RBC / HPF: NONE SEEN /HPF (ref 0–2)
Specific Gravity, Urine: 1.014 (ref 1.001–1.035)
Squamous Epithelial / HPF: NONE SEEN /HPF (ref <=5)
WBC, UA: NONE SEEN /HPF (ref 0–5)
pH: 7 (ref 5.0–8.0)

## 2023-05-30 LAB — NO CULTURE INDICATED

## 2023-05-30 MED ORDER — IOHEXOL 300 MG/ML  SOLN
100.0000 mL | Freq: Once | INTRAMUSCULAR | Status: AC | PRN
  Administered 2023-05-30: 100 mL via INTRAVENOUS

## 2023-05-31 ENCOUNTER — Telehealth: Payer: Self-pay | Admitting: Family Medicine

## 2023-05-31 ENCOUNTER — Telehealth: Payer: Self-pay

## 2023-05-31 DIAGNOSIS — I722 Aneurysm of renal artery: Secondary | ICD-10-CM | POA: Insufficient documentation

## 2023-05-31 DIAGNOSIS — N200 Calculus of kidney: Secondary | ICD-10-CM

## 2023-05-31 DIAGNOSIS — I700 Atherosclerosis of aorta: Secondary | ICD-10-CM | POA: Insufficient documentation

## 2023-05-31 NOTE — Telephone Encounter (Signed)
Please inform patient his CT results have returned: His colon does have a significant stool burden, which I believe is the majority of the cause of his discomfort.  I encouraged him to use the Senokot and MiraLAX as we discussed and if he is not seeing improvement then we can consider prescribing a medication for him at his upcoming visit in early October.   There was a few incidental findings as well: -He has kidney stones present in his left kidney.  If he had pain in the left lower back or flank this could be the kidney stones moving.  Kidney stones are not obstructing currently and likely will not cause any problem and less they do decide to try to dissent to the bladder. -His prostate was enlarged, this does mean he likely is having a fuller bladder at times, which also would contribute to his abdomen fullness.  Hopefully the new medication urology has placed him on will start to work within the next couple weeks.  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  Since his blood pressure is well-controlled, we just need to follow this with a repeat CT in 2 years.  We can discuss the incidental findings in more detail if desired at his upcoming appointment.

## 2023-05-31 NOTE — Telephone Encounter (Signed)
Will contact once resulted with PCP

## 2023-05-31 NOTE — Telephone Encounter (Signed)
Spoke with patient regarding results/recommendations. Pt scheduled for 09/23

## 2023-05-31 NOTE — Telephone Encounter (Signed)
Patient calling in regards to CT result.  He is very confused and concerned as to what results mean.  I told him that once Dr. Claiborne Billings reviews imaging, he will be contacted. He understood.  Please call [phone]

## 2023-06-03 DIAGNOSIS — G4733 Obstructive sleep apnea (adult) (pediatric): Secondary | ICD-10-CM | POA: Diagnosis not present

## 2023-06-05 ENCOUNTER — Ambulatory Visit: Payer: PPO | Admitting: Family Medicine

## 2023-06-10 ENCOUNTER — Ambulatory Visit (INDEPENDENT_AMBULATORY_CARE_PROVIDER_SITE_OTHER): Payer: PPO | Admitting: Family Medicine

## 2023-06-10 ENCOUNTER — Encounter: Payer: Self-pay | Admitting: Family Medicine

## 2023-06-10 VITALS — BP 99/60 | HR 80 | Temp 97.9°F | Wt 202.2 lb

## 2023-06-10 DIAGNOSIS — R1013 Epigastric pain: Secondary | ICD-10-CM | POA: Insufficient documentation

## 2023-06-10 DIAGNOSIS — M25551 Pain in right hip: Secondary | ICD-10-CM | POA: Diagnosis not present

## 2023-06-10 DIAGNOSIS — K5903 Drug induced constipation: Secondary | ICD-10-CM | POA: Diagnosis not present

## 2023-06-10 DIAGNOSIS — I722 Aneurysm of renal artery: Secondary | ICD-10-CM

## 2023-06-10 DIAGNOSIS — R3914 Feeling of incomplete bladder emptying: Secondary | ICD-10-CM | POA: Diagnosis not present

## 2023-06-10 DIAGNOSIS — K2100 Gastro-esophageal reflux disease with esophagitis, without bleeding: Secondary | ICD-10-CM

## 2023-06-10 DIAGNOSIS — N401 Enlarged prostate with lower urinary tract symptoms: Secondary | ICD-10-CM | POA: Diagnosis not present

## 2023-06-10 MED ORDER — LUBIPROSTONE 24 MCG PO CAPS: 24.0000 ug | ORAL_CAPSULE | Freq: Two times a day (BID) | ORAL | 5 refills | Status: DC

## 2023-06-10 MED ORDER — SUCRALFATE 1 G PO TABS: 1.0000 g | ORAL_TABLET | Freq: Three times a day (TID) | ORAL | 0 refills | Status: DC

## 2023-06-10 MED ORDER — FAMOTIDINE 20 MG PO TABS: 20.0000 mg | ORAL_TABLET | Freq: Two times a day (BID) | ORAL | 2 refills | Status: DC

## 2023-06-10 NOTE — Progress Notes (Signed)
Nicholas Graham , 06-15-1951, 72 y.o., male MRN: [id number] Patient Care Team    Relationship Specialty Notifications Start End  Natalia Leatherwood, DO PCP - General Family Medicine  10/31/15   Eldred Manges, MD Consulting Physician Orthopedic Surgery  10/26/21   Armbruster, Willaim Rayas, MD Consulting Physician Gastroenterology  05/29/23   Noel Christmas, MD Consulting Physician Urology  05/29/23     Chief Complaint  Patient presents with   Discuss CT Results   Abdominal Pain     Subjective: VIRGEL ARPINO is a 72 y.o. Pt presents for an OV to follow up on abd pain.  Reports he has been taking the Senokot and MiraLAX.  It has allowed him to have a daily bowel movement, but still straining with movement.  He he feels there may have been some improvement in the lower abdomen fullness, but not completely.  Today he is complaining more of epigastric pain.  He states when he eats anything his stomach starts burning.  He had spaghetti last night and had heartburn all night long.  He does take Protonix 40 mg daily. He had a CT abdomen 05/30/2023 His colon had a significant stool burden There was a few incidental findings as well: -He has kidney stones present in his left kidney.  -His prostate was enlarged -The artery of right kidney has a small aneurysm present.  Since his blood pressure is well-controlled,  repeat CT in 2 years radiology recommendations   Prior note: with complaints of abdominal pain and right sided low back pain of 4 weeks duration.  Patient denies fevers or chills.  He states he has the majority of the pain pointing to suprapubic area and left lower quadrant, but also right lower quadrant and epigastric area are uncomfortable at times.  He is prescribed chronic opiates and reports his bowel movements are hard and difficult to pass.  He has a bowel movement daily of small marble hard stools.  He did start Dulcolax, but has not seen much change.  He reports he hydrates well  drinking 6-8 bottles of water (12 ounces).  He has seen urology recently who started him on Myrbetriq for his BPH.  He is prescribed HCTZ 25 mg for his hypertension.  2017 abdominal CT normal with the exception of a tiny duodenal diverticulum with air noted behind the head of the pancreas.  Colonic diverticula of the descending colon and sigmoid colon are noted.  There was a nonspecific mild segmental thickening of the proximal sigmoid at that time, suspected chronic diverticulitis.  Small hiatal hernia present.  There was a central calcification of the prostate gland at 7 mm and a mild thickening of the urinary bladder wall.  Right inguinal scrotal canal hernia containing fat only.  Tiny left hydrocele. Colonoscopy up-to-date 09/05/2022.  X 2 polyps removed, internal hemorrhoids visualized.  Repeat colonoscopy 7 years.  Noted mild diverticulitis in the sigmoid colon in the past.  Established with Dr. Adela Lank.  He has had inguinal hernias and anterior abdominal wall hernias in the past.     01/18/2023   10:26 AM 11/13/2022    8:16 AM 06/15/2022    1:06 PM 11/30/2021    8:30 AM 06/21/2021    8:07 AM  Depression screen PHQ 2/9  Decreased Interest 0 0 0 0 0  Down, Depressed, Hopeless 0 0 0 0 0  PHQ - 2 Score 0 0 0 0 0  Altered sleeping  1   Tired, decreased energy    0   Change in appetite    1   Feeling bad or failure about yourself     0   Trouble concentrating    0   Moving slowly or fidgety/restless    0   Suicidal thoughts    0   PHQ-9 Score    2     Allergies  Allergen Reactions   Penicillins Hives   Celebrex [Celecoxib]    Cymbalta [Duloxetine Hcl]    Lyrica [Pregabalin]    Social History   Social History Narrative   Married, Designer, television/film set. Children (2) Adult, 4 grandchildren.    9 th grade, Retired.   Wears seatbelt   Smoke detector in the home.    Firearms locked in the home.    Feels safe in his relationships.    Past Medical History:  Diagnosis Date   Arm fracture     left. Dx'd w/RSD post fracture   Arthritis    Carpal tunnel syndrome    Chest pain 2016   [facility] (Dr. Marcelle Overlie) doing stress echo, but feels like musculoskeletal chest wall pain is cause of his discomfort   Chronic back pain    COVID-19 06/2020   Deviated septum    Diverticulosis    GERD (gastroesophageal reflux disease)    Hyperlipidemia    Hypertension    Joint pain    Muscle pain    OSA on CPAP 07/04/2018   - Trial of CPAP therapy on 17 cm H2O with a Large size Fisher&Paykel Full Face Mask Simplus mask and heated humidification.   Plantar fasciitis    Pulmonary nodule 04/2016   Ling[place].  Pt chose to repeat CT chest w/out contrast in 3 mo---as per radiologist's recommendations.   Right inguinal hernia    fat-containing.  Small hydrocele in left hemiscrotum.   Sleep apnea    Snoring    Swelling    of legs/feet/hands   Tobacco dependence in remission    Past Surgical History:  Procedure Laterality Date   COLONOSCOPY     fracture repair left arm     had a fixator   INGUINAL HERNIA REPAIR Left 2013   repeat hernia repair. x2   KNEE ARTHROSCOPY Right    LUMBAR DISC SURGERY  1993   NEURECTOMY inguinal after hernia repair Left    PLANTAR FASCIA SURGERY Right 2000s   SHOULDER ARTHROSCOPY Right    UPPER GI ENDOSCOPY     Family History  Problem Relation Age of Onset   Lung cancer Mother    Lung cancer Father    Bone cancer Brother        bone   Heart disease Maternal Uncle    Colon cancer Neg Hx    Esophageal cancer Neg Hx    Stomach cancer Neg Hx    Allergies as of 06/10/2023       Reactions   Penicillins Hives   Celebrex [celecoxib]    Cymbalta [duloxetine Hcl]    Lyrica [pregabalin]         Medication List        Accurate as of June 10, 2023  8:38 AM. If you have any questions, ask your nurse or doctor.          amLODipine 10 MG tablet Commonly known as: NORVASC Take 1 tablet (10 mg total) by mouth daily.   aspirin 81 MG  tablet Take 81 mg by mouth daily.   baclofen  20 MG tablet Commonly known as: LIORESAL Take 0.5-1 tablets (10-20 mg total) by mouth 3 (three) times daily.   escitalopram 20 MG tablet Commonly known as: LEXAPRO Take 1 tablet (20 mg total) by mouth daily.   gabapentin 600 MG tablet Commonly known as: NEURONTIN Take 1 tablet (600 mg total) by mouth 3 (three) times daily. 1 tab morning, 1.5 tabs afternoon, 1 tab evening,  doses should be every 8 hours.   hydrochlorothiazide 25 MG tablet Commonly known as: HYDRODIURIL Take 1 tablet (25 mg total) by mouth daily.   lisinopril 10 MG tablet Commonly known as: ZESTRIL Take 1 tablet (10 mg total) by mouth daily.   MegaRed Omega-3 Krill Oil 500 MG Caps Take 2 capsules by mouth daily.   Myrbetriq 50 MG Tb24 tablet Generic drug: mirabegron ER Take 50 mg by mouth daily.   oxyCODONE-acetaminophen 10-325 MG tablet Commonly known as: PERCOCET Take 1 tablet by mouth every 8 (eight) hours as needed for pain.   oxyCODONE-acetaminophen 10-325 MG tablet Commonly known as: PERCOCET Take 1 tablet by mouth every 8 (eight) hours as needed for pain.   oxyCODONE-acetaminophen 10-325 MG tablet Commonly known as: PERCOCET Take 1 tablet by mouth every 8 (eight) hours as needed for pain.   pantoprazole 40 MG tablet Commonly known as: PROTONIX Take 1 tablet (40 mg total) by mouth daily.   polyethylene glycol powder 17 GM/SCOOP powder Commonly known as: GLYCOLAX/MIRALAX Take 17 g by mouth daily.   sennosides-docusate sodium 8.6-50 MG tablet Commonly known as: SENOKOT-S Take 2 tablets by mouth at bedtime as needed for constipation.   simvastatin 10 MG tablet Commonly known as: ZOCOR Take 1 tablet (10 mg total) by mouth every other day.   tamsulosin 0.4 MG Caps capsule Commonly known as: FLOMAX Take 0.4 mg by mouth at bedtime.        All past medical history, surgical history, allergies, family history, immunizations andmedications were  updated in the EMR today and reviewed under the history and medication portions of their EMR.     ROS Negative, with the exception of above mentioned in HPI   Objective:  BP 99/60   Pulse 80   Temp 97.9 F (36.6 C)   Wt 202 lb 3.2 oz (91.7 kg)   SpO2 94%   BMI 26.68 kg/m  Body mass index is 26.68 kg/m. Physical Exam Vitals and nursing note reviewed.  Constitutional:      General: He is not in acute distress.    Appearance: Normal appearance. He is not ill-appearing, toxic-appearing or diaphoretic.  HENT:     Head: Normocephalic and atraumatic.  Eyes:     General: No scleral icterus.       Right eye: No discharge.        Left eye: No discharge.     Extraocular Movements: Extraocular movements intact.     Pupils: Pupils are equal, round, and reactive to light.  Cardiovascular:     Rate and Rhythm: Normal rate and regular rhythm.  Pulmonary:     Effort: Pulmonary effort is normal. No respiratory distress.     Breath sounds: Normal breath sounds. No wheezing, rhonchi or rales.  Abdominal:     General: Abdomen is flat. Bowel sounds are normal. There is no distension.     Palpations: Abdomen is soft. There is no fluid wave, hepatomegaly, splenomegaly, mass or pulsatile mass.     Tenderness: There is abdominal tenderness in the epigastric area. There is no right CVA tenderness,  left CVA tenderness, guarding or rebound. Negative signs include Murphy's sign.  Skin:    General: Skin is warm.     Findings: No rash.  Neurological:     Mental Status: He is alert and oriented to person, place, and time. Mental status is at baseline.  Psychiatric:        Mood and Affect: Mood normal.        Behavior: Behavior normal.        Thought Content: Thought content normal.        Judgment: Judgment normal.     No results found. No results found. No results found for this or any previous visit (from the past 24 hour(s)).  Assessment/Plan: KAIKOA CHEA is a 72 y.o. male present for  OV for  Generalized abdominal pain/ Drug-induced constipation/Diverticulosis Start Amitiza twice daily to aid in drug-induced constipation  Renal artery aneurysm (HCC) Very small right renal artery aneurysm.  Blood pressures are very well-maintained.  Radiology recommendations follow-up in 2 years CT  Benign prostatic hyperplasia with incomplete bladder emptying Continue to follow-up with urology  Gastroesophageal reflux disease with esophagitis without hemorrhage/epigastric pain Today his symptoms are more upper GI related despite Protonix 40 mg daily Avoid triggers, avoid spicy foods Added Pepcid twice daily and Carafate Referred back to his GI team for further evaluation - Ambulatory referral to Gastroenterology  pain of right hip New complaint today of right lateral hip pain.  Consider MSK, bursitis IT band.  He is established with sports medicine will refer back to them for further eval. - Ambulatory referral to Sports Medicine   Reviewed expectations re: course of current medical issues. Discussed self-management of symptoms. Outlined signs and symptoms indicating need for more acute intervention. Patient verbalized understanding and all questions were answered. Patient received an After-Visit Summary.    No orders of the defined types were placed in this encounter.  No orders of the defined types were placed in this encounter.  Referral Orders  No referral(s) requested today     Note is dictated utilizing voice recognition software. Although note has been proof read prior to signing, occasional typographical errors still can be missed. If any questions arise, please do not hesitate to call for verification.   electronically signed by:  Felix Pacini, DO  [facility] [place]

## 2023-06-10 NOTE — Patient Instructions (Signed)
Continue Protonix, start pepcid twice a day and carafate before meals.   Referred you back to GI  Amitiza start once a day for 7 days and then every 12 hours with food. This helps with constipation    Make sure to follow up with with your urologist.

## 2023-06-12 ENCOUNTER — Other Ambulatory Visit: Payer: Self-pay

## 2023-06-12 ENCOUNTER — Encounter: Payer: Self-pay | Admitting: Family Medicine

## 2023-06-12 ENCOUNTER — Ambulatory Visit: Payer: PPO | Admitting: Family Medicine

## 2023-06-12 VITALS — BP 116/70 | HR 73 | Ht 73.0 in | Wt 201.0 lb

## 2023-06-12 DIAGNOSIS — M25551 Pain in right hip: Secondary | ICD-10-CM | POA: Diagnosis not present

## 2023-06-12 NOTE — Patient Instructions (Signed)
Thank you for coming in today.   Call or go to the ER if you develop a large red swollen joint with extreme pain or oozing puss.    I think you have trochanteric bursitis.   Let me know how this goes.   Work on side leg raises 10-30 reps 1-3x a day.   Work on the standing IT band stretch.    Hip Bursitis Rehab Ask your health care provider which exercises are safe for you. Do exercises exactly as told by your health care provider and adjust them as directed. It is normal to feel mild stretching, pulling, tightness, or discomfort as you do these exercises. Stop right away if you feel sudden pain or your pain gets worse. Do not begin these exercises until told by your health care provider. Stretching exercise This exercise warms up your muscles and joints and improves the movement and flexibility of your hip. This exercise also helps to relieve pain and stiffness. Iliotibial band stretch An iliotibial band is a strong band of muscle tissue that runs from the outer side of your hip to the outer side of your thigh and knee. Lie on your side with your left / right leg in the top position. Bend your left / right knee and grab your ankle. Stretch out your bottom arm to help you balance. Slowly bring your knee back so your thigh is slightly behind your body. Slowly lower your knee toward the floor until you feel a gentle stretch on the outside of your left / right thigh. If you do not feel a stretch and your knee will not lower more toward the floor, place the heel of your other foot on top of your knee and pull your knee down toward the floor with your foot. Hold this position for __________ seconds. Slowly return to the starting position. Repeat __________ times. Complete this exercise __________ times a day. Strengthening exercises These exercises build strength and endurance in your hip and pelvis. Endurance is the ability to use your muscles for a long time, even after they get  tired. Bridge This exercise strengthens the muscles that move your thigh backward (hip extensors). Lie on your back on a firm surface with your knees bent and your feet flat on the floor. Tighten your buttocks muscles and lift your buttocks off the floor until your trunk is level with your thighs. Do not arch your back. You should feel the muscles working in your buttocks and the back of your thighs. If you do not feel these muscles, slide your feet 1-2 inches (2.5-5 cm) farther away from your buttocks. If this exercise is too easy, try doing it with your arms crossed over your chest. Hold this position for __________ seconds. Slowly lower your hips to the starting position. Let your muscles relax completely after each repetition. Repeat __________ times. Complete this exercise __________ times a day. Squats This exercise strengthens the muscles in front of your thigh and knee (quadriceps). Stand in front of a table, with your feet and knees pointing straight ahead. You may rest your hands on the table for balance but not for support. Slowly bend your knees and lower your hips like you are going to sit in a chair. Keep your weight over your heels, not over your toes. Keep your lower legs upright so they are parallel with the table legs. Do not let your hips go lower than your knees. Do not bend lower than told by your health care provider.  If your hip pain increases, do not bend as low. Hold the squat position for __________ seconds. Slowly push with your legs to return to standing. Do not use your hands to pull yourself to standing. Repeat __________ times. Complete this exercise __________ times a day. Hip hike  Stand sideways on a bottom step. Stand on your left / right leg with your other foot unsupported next to the step. You can hold on to the railing or wall for balance if needed. Keep your knees straight and your torso square. Then lift your left / right hip up toward the  ceiling. Hold this position for __________ seconds. Slowly let your left / right hip lower toward the floor, past the starting position. Your foot should get closer to the floor. Do not lean or bend your knees. Repeat __________ times. Complete this exercise __________ times a day. Single leg stand This exercise increases your balance. Without shoes, stand near a railing or in a doorway. You may hold on to the railing or door frame as needed for balance. Squeeze your left / right buttock muscles, then lift up your other foot. Do not let your left / right hip push out to the side. It is helpful to stand in front of a mirror for this exercise so you can watch your hip. Hold this position for __________ seconds. Repeat __________ times. Complete this exercise __________ times a day. This information is not intended to replace advice given to you by your health care provider. Make sure you discuss any questions you have with your health care provider. Document Revised: 08/16/2021 Document Reviewed: 08/16/2021 Elsevier Patient Education  2024 [employer].

## 2023-06-12 NOTE — Progress Notes (Signed)
   I, [name], CMA acting as a scribe for [name], MD.  Nicholas Graham is a 72 y.o. male who presents to [facility] today for right hip pain. Pt was last seen by Dr. [name] on 10/26/21 for right shoulder pain.   Today patient reports right hip pain x 3-4 weeks. Locates pain to lateral aspect. Sx started suddenly. Notes that sx started around the time that GI sx started 5 weeks ago. Also c/o lower back pain which is worse when standing from recliner. Denies pain in the gluteal region, groin, or legs. Denies n/t/w in the LE. Does a lot of walking throughout the day, notes that sx start to flare at about 1-2 miles. Has tried topical Tylenol with short-term relief.   Diagnostic Imaging 01/23/23 - MRI L-spine 02/27/23 - MRI T-spine  Pertinent review of systems: No fevers or chills  Relevant historical information: Hypertension History lumbar surgery earlier this year with Dr. Yevette Edwards.  Exam:  BP 116/70   Pulse 73   Ht 6\' 1"  (1.854 m)   Wt 201 lb (91.2 kg)   SpO2 93%   BMI 26.52 kg/m  General: Well Developed, well nourished, and in no acute distress.   MSK: Right hip: Normal-appearing Normal motion. Tender palpation at greater trochanter. Hip abduction strength is mildly diminished 4/5.    Lab and Radiology Results  Procedure: Real-time Ultrasound Guided Injection of right lateral hip greater trochanter bursa Device: Philips Affiniti 50G/GE Logiq Images permanently stored and available for review in PACS Verbal informed consent obtained.  Discussed risks and benefits of procedure. Warned about infection, bleeding, hyperglycemia damage to structures among others. Patient expresses understanding and agreement Time-out conducted.   Noted no overlying erythema, induration, or other signs of local infection.   Skin prepped in a sterile fashion.   Local anesthesia: Topical Ethyl chloride.   With sterile technique and under real time ultrasound  guidance: 40 mg of Kenalog and 2 mL of Marcaine injected into greater trochanter bursa. Fluid seen entering the bursa.   Completed without difficulty   Pain immediately resolved suggesting accurate placement of the medication.   Advised to call if fevers/chills, erythema, induration, drainage, or persistent bleeding.   Images permanently stored and available for review in the ultrasound unit.  Impression: Technically successful ultrasound guided injection.         Assessment and Plan: 72 y.o. male with right lateral hip pain thought to be greater trochanteric bursitis.  Plan for steroid injection today.  We talked about home exercise program.  Exercise program taught in clinic today.  He would like to avoid physical therapy.  Check back as needed.   PDMP not reviewed this encounter. Orders Placed This Encounter  Procedures   [place] LIMITED JOINT SPACE STRUCTURES LOW RIGHT(NO LINKED CHARGES)    Order Specific Question:   Reason for Exam (SYMPTOM  OR DIAGNOSIS REQUIRED)    Answer:   right hip pain    Order Specific Question:   Preferred imaging location?    Answer:   [facility]   No orders of the defined types were placed in this encounter.    Discussed warning signs or symptoms. Please see discharge instructions. Patient expresses understanding.   The above documentation has been reviewed and is accurate and complete [name], M.D.

## 2023-06-13 ENCOUNTER — Ambulatory Visit: Payer: PPO | Admitting: Family Medicine

## 2023-06-19 ENCOUNTER — Ambulatory Visit: Payer: PPO

## 2023-06-19 VITALS — BP 118/70 | HR 74 | Temp 97.8°F | Wt 196.6 lb

## 2023-06-19 DIAGNOSIS — Z0000 Encounter for general adult medical examination without abnormal findings: Secondary | ICD-10-CM | POA: Diagnosis not present

## 2023-06-19 NOTE — Progress Notes (Signed)
Subjective:   Nicholas Graham is a 72 y.o. male who presents for Medicare Annual/Subsequent preventive examination.  Visit Complete: In person  Patient Medicare AWV questionnaire was completed by the patient on 06/19/23; I have confirmed that all information answered by patient is correct and no changes since this date.        Objective:    Today's Vitals   06/19/23 0857 06/19/23 0901  BP:  118/70  Pulse:  74  Temp:  97.8 F (36.6 C)  SpO2:  98%  Weight:  196 lb 9.6 oz (89.2 kg)  PainSc: 5     Body mass index is 25.94 kg/m.     06/19/2023   11:08 AM 06/15/2022    1:10 PM 06/10/2021    8:33 AM 06/26/2018    8:57 PM  Advanced Directives  Does Patient Have a Medical Advance Directive? No No No No  Would patient like information on creating a medical advance directive? No - Guardian declined No - Patient declined No - Patient declined No - Patient declined    Current Medications (verified) Outpatient Encounter Medications as of 06/19/2023  Medication Sig   amLODipine (NORVASC) 10 MG tablet Take 1 tablet (10 mg total) by mouth daily.   aspirin 81 MG tablet Take 81 mg by mouth daily.   baclofen (LIORESAL) 20 MG tablet Take 0.5-1 tablets (10-20 mg total) by mouth 3 (three) times daily.   escitalopram (LEXAPRO) 20 MG tablet Take 1 tablet (20 mg total) by mouth daily.   famotidine (PEPCID) 20 MG tablet Take 1 tablet (20 mg total) by mouth 2 (two) times daily.   gabapentin (NEURONTIN) 600 MG tablet Take 1 tablet (600 mg total) by mouth 3 (three) times daily. 1 tab morning, 1.5 tabs afternoon, 1 tab evening,  doses should be every 8 hours.   hydrochlorothiazide (HYDRODIURIL) 25 MG tablet Take 1 tablet (25 mg total) by mouth daily.   lisinopril (ZESTRIL) 10 MG tablet Take 1 tablet (10 mg total) by mouth daily.   lubiprostone (AMITIZA) 24 MCG capsule Take 1 capsule (24 mcg total) by mouth 2 (two) times daily with a meal.   MegaRed Omega-3 Krill Oil 500 MG CAPS Take 2 capsules by  mouth daily.    MYRBETRIQ 50 MG TB24 tablet Take 50 mg by mouth daily.   pantoprazole (PROTONIX) 40 MG tablet Take 1 tablet (40 mg total) by mouth daily.   sennosides-docusate sodium (SENOKOT-S) 8.6-50 MG tablet Take 2 tablets by mouth at bedtime as needed for constipation.   simvastatin (ZOCOR) 10 MG tablet Take 1 tablet (10 mg total) by mouth every other day.   sucralfate (CARAFATE) 1 g tablet Take 1 tablet (1 g total) by mouth 4 (four) times daily -  with meals and at bedtime.   tamsulosin (FLOMAX) 0.4 MG CAPS capsule Take 0.4 mg by mouth at bedtime.   [DISCONTINUED] amLODipine (NORVASC) 10 MG tablet Take 1 tablet (10 mg total) by mouth daily.   [DISCONTINUED] baclofen (LIORESAL) 20 MG tablet Take 0.5-1 tablets (10-20 mg total) by mouth 3 (three) times daily.   [DISCONTINUED] calcitonin, salmon, (MIACALCIN/FORTICAL) 200 UNIT/ACT nasal spray SMARTSIG:Both Nares (Patient not taking: Reported on 04/09/2023)   [DISCONTINUED] escitalopram (LEXAPRO) 20 MG tablet Take 1 tablet (20 mg total) by mouth daily.   [DISCONTINUED] gabapentin (NEURONTIN) 600 MG tablet Take 1 tablet (600 mg total) by mouth 3 (three) times daily. 1 tab morning, 1.5 tabs afternoon, 1 tab evening,  doses should be every 8 hours.   [  DISCONTINUED] hydrochlorothiazide (HYDRODIURIL) 25 MG tablet Take 1 tablet (25 mg total) by mouth daily.   [DISCONTINUED] lisinopril (ZESTRIL) 10 MG tablet Take 1 tablet (10 mg total) by mouth daily.   [DISCONTINUED] oxyCODONE-acetaminophen (PERCOCET) 10-325 MG tablet Take 1 tablet by mouth every 8 (eight) hours as needed for pain.   [DISCONTINUED] oxyCODONE-acetaminophen (PERCOCET) 10-325 MG tablet Take 1 tablet by mouth every 8 (eight) hours as needed for pain.   [DISCONTINUED] oxyCODONE-acetaminophen (PERCOCET) 10-325 MG tablet Take 1 tablet by mouth every 8 (eight) hours as needed for pain.   [DISCONTINUED] polyethylene glycol powder (GLYCOLAX/MIRALAX) 17 GM/SCOOP powder Take 17 g by mouth daily.    [DISCONTINUED] simvastatin (ZOCOR) 10 MG tablet Take 1 tablet (10 mg total) by mouth every other day.   No facility-administered encounter medications on file as of 06/19/2023.    Allergies (verified) Penicillins, Celebrex [celecoxib], Cymbalta [duloxetine hcl], and Lyrica [pregabalin]   History: Past Medical History:  Diagnosis Date   Arm fracture    left. Dx'd w/RSD post fracture   Arthritis    Carpal tunnel syndrome    Chest pain 2016   [facility] (Dr. Marcelle Overlie) doing stress echo, but feels like musculoskeletal chest wall pain is cause of his discomfort   Chronic back pain    COVID-19 06/2020   Deviated septum    Diverticulosis    GERD (gastroesophageal reflux disease)    Hyperlipidemia    Hypertension    Joint pain    Muscle pain    OSA on CPAP 07/04/2018   - Trial of CPAP therapy on 17 cm H2O with a Large size Fisher[employer]Paykel Full Face Mask Simplus mask and heated humidification.   Plantar fasciitis    Pulmonary nodule 04/2016   [place]ing[place].  Pt chose to repeat CT chest w/out contrast in 3 mo---as per radiologist's recommendations.   Right inguinal hernia    fat-containing.  Small hydrocele in left hemiscrotum.   Sleep apnea    Snoring    Swelling    of legs/feet/hands   Tobacco dependence in remission    Past Surgical History:  Procedure Laterality Date   COLONOSCOPY     fracture repair left arm     had a fixator   INGUINAL HERNIA REPAIR Left 2013   repeat hernia repair. x2   KNEE ARTHROSCOPY Right    LUMBAR DISC SURGERY  1993   NEURECTOMY inguinal after hernia repair Left    PLANTAR FASCIA SURGERY Right 2000s   SHOULDER ARTHROSCOPY Right    UPPER GI ENDOSCOPY     Family History  Problem Relation Age of Onset   Lung cancer Mother    Lung cancer Father    Bone cancer Brother        bone   Heart disease Maternal Uncle    Colon cancer Neg Hx    Esophageal cancer Neg Hx    Stomach cancer Neg Hx    Social History   Socioeconomic History   Marital  status: Married    Spouse name: Not on file   Number of children: 2   Years of education: Not on file   Highest education level: 8th grade  Occupational History   Not on file  Tobacco Use   Smoking status: Former    Current packs/day: 0.00    Average packs/day: 3.0 packs/day for 30.0 years (90.0 ttl pk-yrs)    Types: Cigarettes    Start date: 07/16/1972    Quit date: 07/16/2002    Years since quitting:  20.9   Smokeless tobacco: Never  Vaping Use   Vaping status: Never Used  Substance and Sexual Activity   Alcohol use: Yes    Alcohol/week: 1.0 - 2.0 standard drink of alcohol    Types: 1 - 2 Cans of beer per week    Comment: socially   Drug use: No   Sexual activity: Yes  Other Topics Concern   Not on file  Social History Narrative   Married, Natalia Leatherwood. Children (2) Adult, 4 grandchildren.    9 th grade, Retired.   Wears seatbelt   Smoke detector in the home.    Firearms locked in the home.    Feels safe in his relationships.    Social Determinants of Health   Financial Resource Strain: Low Risk  (06/19/2023)   Overall Financial Resource Strain (CARDIA)    Difficulty of Paying Living Expenses: Not hard at all  Food Insecurity: No Food Insecurity (06/19/2023)   Hunger Vital Sign    Worried About Running Out of Food in the Last Year: Never true    Ran Out of Food in the Last Year: Never true  Transportation Needs: No Transportation Needs (06/19/2023)   PRAPARE - Administrator, Civil Service (Medical): No    Lack of Transportation (Non-Medical): No  Physical Activity: Sufficiently Active (06/19/2023)   Exercise Vital Sign    Days of Exercise per Week: 7 days    Minutes of Exercise per Session: 70 min  Stress: No Stress Concern Present (06/19/2023)   [employer] of Occupational Health - Occupational Stress Questionnaire    Feeling of Stress : Not at all  Social Connections: Moderately Isolated (06/19/2023)   Social Connection and Isolation Panel [NHANES]     Frequency of Communication with Friends and Family: Three times a week    Frequency of Social Gatherings with Friends and Family: Once a week    Attends Religious Services: Never    Database administrator or Organizations: No    Attends Engineer, structural: Never    Marital Status: Married    Tobacco Counseling Counseling given: Not Answered   Clinical Intake:     Pain : 0-10 Pain Score: 5  Pain Location: Abdomen     Diabetes: No  How often do you need to have someone help you when you read instructions, pamphlets, or other written materials from your doctor or pharmacy?: 1 - Never  Interpreter Needed?: No      Activities of Daily Living    06/19/2023    9:16 AM  In your present state of health, do you have any difficulty performing the following activities:  Hearing? 0  Vision? 0  Difficulty concentrating or making decisions? 0  Walking or climbing stairs? 0  Dressing or bathing? 0  Doing errands, shopping? 0  Preparing Food and eating ? N  Using the Toilet? N  In the past six months, have you accidently leaked urine? N  Do you have problems with loss of bowel control? N  Managing your Medications? N  Managing your Finances? N  Housekeeping or managing your Housekeeping? N    Patient Care Team: Natalia Leatherwood, DO as PCP - General (Family Medicine) Eldred Manges, MD as Consulting Physician (Orthopedic Surgery) Armbruster, Willaim Rayas, MD as Consulting Physician (Gastroenterology) Noel Christmas, MD as Consulting Physician (Urology)  Indicate any recent Medical Services you may have received from other than [facility] providers in the past year (date may be approximate).  Assessment:   This is a routine wellness examination for Ezio.  Hearing/Vision screen No results found.   Goals Addressed             This Visit's Progress    Patient Stated       Quit going to doctors       Depression Screen    06/19/2023    9:02 AM  01/18/2023   10:26 AM 11/13/2022    8:16 AM 06/15/2022    1:06 PM 11/30/2021    8:30 AM 06/21/2021    8:07 AM 06/10/2021    8:13 AM  PHQ 2/9 Scores  PHQ - 2 Score 0 0 0 0 0 0 0  PHQ- 9 Score     2      Fall Risk    06/19/2023    9:02 AM 01/18/2023   10:26 AM 11/09/2022   10:50 AM 06/15/2022    1:06 PM 09/09/2021    9:24 AM  Fall Risk   Falls in the past year? 0 0 0 0 0  Number falls in past yr: 0 0  0   Injury with Fall? 0 0  0   Risk for fall due to : No Fall Risks   No Fall Risks   Follow up Falls evaluation completed Falls evaluation completed  Falls evaluation completed     MEDICARE RISK AT HOME: Medicare Risk at Home Any stairs in or around the home?: Yes If so, are there any without handrails?: No Home free of loose throw rugs in walkways, pet beds, electrical cords, etc?: Yes Adequate lighting in your home to reduce risk of falls?: Yes Life alert?: No Use of a cane, walker or w/c?: No Grab bars in the bathroom?: Yes Shower chair or bench in shower?: No Elevated toilet seat or a handicapped toilet?: No  TIMED UP AND GO:  Was the test performed?  Yes  Length of time to ambulate 10 feet: 7 sec Gait steady and fast without use of assistive device    Cognitive Function:        06/19/2023    9:03 AM 06/15/2022    1:10 PM 06/10/2021    8:34 AM  6CIT Screen  What Year? 0 points 0 points 0 points  What month? 0 points 0 points 0 points  What time? 0 points 0 points 3 points  Count back from 20 0 points 0 points 0 points  Months in reverse 0 points  4 points  Repeat phrase 0 points 0 points 10 points  Total Score 0 points  17 points    Immunizations  There is no immunization history on file for this patient.  TDAP status: Due, Education has been provided regarding the importance of this vaccine. Advised may receive this vaccine at local pharmacy or Health Dept. Aware to provide a copy of the vaccination record if obtained from local pharmacy or Health Dept. Verbalized  acceptance and understanding.  Flu Vaccine status: Declined, Education has been provided regarding the importance of this vaccine but patient still declined. Advised may receive this vaccine at local pharmacy or Health Dept. Aware to provide a copy of the vaccination record if obtained from local pharmacy or Health Dept. Verbalized acceptance and understanding.  Pneumococcal vaccine status: Declined,  Education has been provided regarding the importance of this vaccine but patient still declined. Advised may receive this vaccine at local pharmacy or Health Dept. Aware to provide a copy of the vaccination record if obtained from  local pharmacy or Health Dept. Verbalized acceptance and understanding.   Covid-19 vaccine status: Declined, Education has been provided regarding the importance of this vaccine but patient still declined. Advised may receive this vaccine at local pharmacy or Health Dept.or vaccine clinic. Aware to provide a copy of the vaccination record if obtained from local pharmacy or Health Dept. Verbalized acceptance and understanding.  Qualifies for Shingles Vaccine? Yes   Zostavax completed No   Shingrix Completed?: No.    Education has been provided regarding the importance of this vaccine. Patient has been advised to call insurance company to determine out of pocket expense if they have not yet received this vaccine. Advised may also receive vaccine at local pharmacy or Health Dept. Verbalized acceptance and understanding.  Screening Tests Health Maintenance  Topic Date Due   INFLUENZA VACCINE  12/16/2023 (Originally 04/18/2023)   Pneumonia Vaccine 39+ Years old (1 of 1 - PCV) 06/09/2024 (Originally 07/14/2018)   Medicare Annual Wellness (AWV)  06/18/2024   Colonoscopy  09/05/2029   Hepatitis C Screening  Completed   HPV VACCINES  Aged Out   DTaP/Tdap/Td  Discontinued   COVID-19 Vaccine  Discontinued   Zoster Vaccines- Shingrix  Discontinued    Health Maintenance  There  are no preventive care reminders to display for this patient.   Colorectal cancer screening: Type of screening: Colonoscopy. Completed 09/05/22. Repeat every 7 years  Lung Cancer Screening: (Low Dose CT Chest recommended if Age 61-80 years, 20 pack-year currently smoking OR have quit w/in 15years.) does not qualify.   Lung Cancer Screening Referral: n/a  Additional Screening:  Hepatitis C Screening: does not qualify; Completed 05/31/20  Vision Screening: Recommended annual ophthalmology exams for early detection of glaucoma and other disorders of the eye. Is the patient up to date with their annual eye exam?  No  Who is the provider or what is the name of the office in which the patient attends annual eye exams? [facility] If pt is not established with a provider, would they like to be referred to a provider to establish care? No .   Dental Screening: Recommended annual dental exams for proper oral hygiene  Diabetic Foot Exam: n/a  Community Resource Referral / Chronic Care Management: CRR required this visit?  No   CCM required this visit?  No     Plan:     I have personally reviewed and noted the following in the patient's chart:   Medical and social history Use of alcohol, tobacco or illicit drugs  Current medications and supplements including opioid prescriptions. Patient is currently taking opioid prescriptions. Information provided to patient regarding non-opioid alternatives. Patient advised to discuss non-opioid treatment plan with their provider. Functional ability and status Nutritional status Physical activity Advanced directives List of other physicians Hospitalizations, surgeries, and ER visits in previous 12 months Vitals Screenings to include cognitive, depression, and falls Referrals and appointments  In addition, I have reviewed and discussed with patient certain preventive protocols, quality metrics, and best practice recommendations. A written  personalized care plan for preventive services as well as general preventive health recommendations were provided to patient.     Filomena Jungling, CMA   06/21/2023   After Visit Summary: (In Person-Declined) Patient declined AVS at this time.  Nurse Notes: Non-Face to Face or Face to Face 10 minute visit Encounter    Mr. Teicher , Thank you for taking time to come for your Medicare Wellness Visit. I appreciate your ongoing commitment to your health  goals. Please review the following plan we discussed and let me know if I can assist you in the future.   These are the goals we discussed:  Goals      Patient Stated     Quit going to doctors        This is a list of the screening recommended for you and due dates:  Health Maintenance  Topic Date Due   Flu Shot  12/16/2023*   Pneumonia Vaccine (1 of 1 - PCV) 06/09/2024*   Medicare Annual Wellness Visit  06/18/2024   Colon Cancer Screening  09/05/2029   Hepatitis C Screening  Completed   HPV Vaccine  Aged Out   DTaP/Tdap/Td vaccine  Discontinued   COVID-19 Vaccine  Discontinued   Zoster (Shingles) Vaccine  Discontinued  *Topic was postponed. The date shown is not the original due date.

## 2023-06-19 NOTE — Patient Instructions (Signed)
Health Maintenance, Male Adopting a healthy lifestyle and getting preventive care are important in promoting health and wellness. Ask your health care provider about: The right schedule for you to have regular tests and exams. Things you can do on your own to prevent diseases and keep yourself healthy. What should I know about diet, weight, and exercise? Eat a healthy diet  Eat a diet that includes plenty of vegetables, fruits, low-fat dairy products, and lean protein. Do not eat a lot of foods that are high in solid fats, added sugars, or sodium. Maintain a healthy weight Body mass index (BMI) is a measurement that can be used to identify possible weight problems. It estimates body fat based on height and weight. Your health care provider can help determine your BMI and help you achieve or maintain a healthy weight. Get regular exercise Get regular exercise. This is one of the most important things you can do for your health. Most adults should: Exercise for at least 150 minutes each week. The exercise should increase your heart rate and make you sweat (moderate-intensity exercise). Do strengthening exercises at least twice a week. This is in addition to the moderate-intensity exercise. Spend less time sitting. Even light physical activity can be beneficial. Watch cholesterol and blood lipids Have your blood tested for lipids and cholesterol at 70 years of age, then have this test every 5 years. You may need to have your cholesterol levels checked more often if: Your lipid or cholesterol levels are high. You are older than 70 years of age. You are at high risk for heart disease. What should I know about cancer screening? Many types of cancers can be detected early and may often be prevented. Depending on your health history and family history, you may need to have cancer screening at various ages. This may include screening for: Colorectal cancer. Prostate cancer. Skin cancer. Lung  cancer. What should I know about heart disease, diabetes, and high blood pressure? Blood pressure and heart disease High blood pressure causes heart disease and increases the risk of stroke. This is more likely to develop in people who have high blood pressure readings or are overweight. Talk with your health care provider about your target blood pressure readings. Have your blood pressure checked: Every 3-5 years if you are 18-39 years of age. Every year if you are 40 years old or older. If you are between the ages of 65 and 75 and are a current or former smoker, ask your health care provider if you should have a one-time screening for abdominal aortic aneurysm (AAA). Diabetes Have regular diabetes screenings. This checks your fasting blood sugar level. Have the screening done: Once every three years after age 45 if you are at a normal weight and have a low risk for diabetes. More often and at a younger age if you are overweight or have a high risk for diabetes. What should I know about preventing infection? Hepatitis B If you have a higher risk for hepatitis B, you should be screened for this virus. Talk with your health care provider to find out if you are at risk for hepatitis B infection. Hepatitis C Blood testing is recommended for: Everyone born from 1945 through 1965. Anyone with known risk factors for hepatitis C. Sexually transmitted infections (STIs) You should be screened each year for STIs, including gonorrhea and chlamydia, if: You are sexually active and are younger than 70 years of age. You are older than 70 years of age and your   health care provider tells you that you are at risk for this type of infection. Your sexual activity has changed since you were last screened, and you are at increased risk for chlamydia or gonorrhea. Ask your health care provider if you are at risk. Ask your health care provider about whether you are at high risk for HIV. Your health care provider  may recommend a prescription medicine to help prevent HIV infection. If you choose to take medicine to prevent HIV, you should first get tested for HIV. You should then be tested every 3 months for as long as you are taking the medicine. Follow these instructions at home: Alcohol use Do not drink alcohol if your health care provider tells you not to drink. If you drink alcohol: Limit how much you have to 0-2 drinks a day. Know how much alcohol is in your drink. In the [place] one drink equals one 12 oz bottle of beer (355 mL), one 5 oz glass of wine (148 mL), or one 1 oz glass of hard liquor (44 mL). Lifestyle Do not use any products that contain nicotine or tobacco. These products include cigarettes, chewing tobacco, and vaping devices, such as e-cigarettes. If you need help quitting, ask your health care provider. Do not use street drugs. Do not share needles. Ask your health care provider for help if you need support or information about quitting drugs. General instructions Schedule regular health, dental, and eye exams. Stay current with your vaccines. Tell your health care provider if: You often feel depressed. You have ever been abused or do not feel safe at home. Summary Adopting a healthy lifestyle and getting preventive care are important in promoting health and wellness. Follow your health care provider's instructions about healthy diet, exercising, and getting tested or screened for diseases. Follow your health care provider's instructions on monitoring your cholesterol and blood pressure. This information is not intended to replace advice given to you by your health care provider. Make sure you discuss any questions you have with your health care provider. Document Revised: 01/23/2021 Document Reviewed: 01/23/2021 Elsevier Patient Education  2024 [employer]  

## 2023-06-20 ENCOUNTER — Ambulatory Visit (INDEPENDENT_AMBULATORY_CARE_PROVIDER_SITE_OTHER): Payer: PPO | Admitting: Family Medicine

## 2023-06-20 ENCOUNTER — Encounter: Payer: Self-pay | Admitting: Family Medicine

## 2023-06-20 VITALS — BP 120/72 | HR 80 | Temp 97.9°F | Ht 72.0 in | Wt 195.0 lb

## 2023-06-20 DIAGNOSIS — Z0000 Encounter for general adult medical examination without abnormal findings: Secondary | ICD-10-CM | POA: Diagnosis not present

## 2023-06-20 DIAGNOSIS — Z23 Encounter for immunization: Secondary | ICD-10-CM

## 2023-06-20 DIAGNOSIS — E785 Hyperlipidemia, unspecified: Secondary | ICD-10-CM

## 2023-06-20 DIAGNOSIS — Z125 Encounter for screening for malignant neoplasm of prostate: Secondary | ICD-10-CM | POA: Diagnosis not present

## 2023-06-20 DIAGNOSIS — M5134 Other intervertebral disc degeneration, thoracic region: Secondary | ICD-10-CM

## 2023-06-20 DIAGNOSIS — I10 Essential (primary) hypertension: Secondary | ICD-10-CM | POA: Diagnosis not present

## 2023-06-20 DIAGNOSIS — G8929 Other chronic pain: Secondary | ICD-10-CM

## 2023-06-20 DIAGNOSIS — M5136 Other intervertebral disc degeneration, lumbar region with discogenic back pain only: Secondary | ICD-10-CM

## 2023-06-20 DIAGNOSIS — M546 Pain in thoracic spine: Secondary | ICD-10-CM

## 2023-06-20 DIAGNOSIS — R7309 Other abnormal glucose: Secondary | ICD-10-CM

## 2023-06-20 LAB — TSH: TSH: 0.97 u[IU]/mL (ref 0.35–5.50)

## 2023-06-20 LAB — LIPID PANEL
Cholesterol: 182 mg/dL (ref 0–200)
HDL: 53.2 mg/dL (ref >39.00)
LDL Cholesterol: 113 mg/dL — ABNORMAL HIGH (ref 0–99)
NonHDL: 128.9
Total CHOL/HDL Ratio: 3
Triglycerides: 78 mg/dL (ref 0.0–149.0)
VLDL: 15.6 mg/dL (ref 0.0–40.0)

## 2023-06-20 LAB — PSA, MEDICARE: PSA: 1.93 ng/mL (ref 0.10–4.00)

## 2023-06-20 LAB — HEMOGLOBIN A1C: Hgb A1c MFr Bld: 5.8 % (ref 4.6–6.5)

## 2023-06-20 MED ORDER — OXYCODONE-ACETAMINOPHEN 10-325 MG PO TABS: 1.0000 | ORAL_TABLET | Freq: Three times a day (TID) | ORAL | 0 refills | Status: DC | PRN

## 2023-06-20 NOTE — Patient Instructions (Signed)

## 2023-06-20 NOTE — Progress Notes (Signed)
Patient ID: Nicholas Graham, male  DOB: 30-Nov-1950, 72 y.o.   MRN: [id number] Patient Care Team    Relationship Specialty Notifications Start End  Natalia Leatherwood, DO PCP - General Family Medicine  10/31/15   Eldred Manges, MD Consulting Physician Orthopedic Surgery  10/26/21   Armbruster, Willaim Rayas, MD Consulting Physician Gastroenterology  05/29/23   Noel Christmas, MD Consulting Physician Urology  05/29/23     Chief Complaint  Patient presents with   Annual Exam    Pt is not fasting    Subjective:  Nicholas Graham is a 72 y.o. male present for CPE and Chronic Conditions/illness Management  All past medical history, surgical history, allergies, family history, immunizations, medications and social history were updated in the electronic medical record today. All recent labs, ED visits and hospitalizations within the last year were reviewed.  Health maintenance:  Colonoscopy: last screen 09/05/2022, recommend follow up 7 years; Dr. Adela Lank Immunizations:  tdap declined, influenza declined, PNA series declined, zostavax declined Infectious disease screening: Hep C completed.  Lab Results  Component Value Date   PSA 1.79 05/29/2022   PSA 1.96 06/21/2021   PSA 1.64 05/31/2020  , pt was counseled on prostate cancer screenings.  Patient has a Dental home. Hospitalizations/ED visits: Reviewed  Cervical neck pain with evidence of disc disease/Chronic midline thoracic back pain/Thoracic degenerative disc disease/DDD (degenerative disc disease), lumbar/History of lumbar discectomy Pt presents for his chronic pain management treatment for his cervical and lumbar conditions. His discomfort has progressed since 2020-he feels he has some  relief from current regimen. He has been evaluated  by neuro and pain rehab. He has received injections- that have only provided a few days of temporary relief in the past . He is frustrated, but has appropriate expectations that he will be living with  some pain.  He reports he is maintaining a quality of life with the use of opiates, that allows him to remain active.  With Percocet 10-325 (1 tab every 8-12 hours), baclofen and gabapentin. 600/600/600 (had headaches at increased doses).  Indication for chronic opioid: Cervical spine degeneration, chronic neck and back pain. Medication and dose: Oxycodone-acetaminophen 10-325 1 tab TID prn daily # pills per month: 60 Last UDS date:UYD 10/202 3 Pain contract signed (Y/N): Yes- UTD 12/202 3 Date narcotic database last reviewed (/include red flags): 06/20/23  03/08/2021  had epidural injection of cervical spine and he feels it is helpful.  11/2021-has had repeat injections that have not been as helpful. Original note: Lower cervical to mid thoracic pain still present. Using his right arm exacerbates his pain midline of thoracic.  He has a history thoracic spine degeneration and discectomy in his lumbar spine.  Has degenerative changes, bone spurring and facet arthropathy in his lumbar spine by x-rays obtained 11/2018. Cervical spine x-ray with mild bilateral foraminal narrowing at C3-C4 11/2018. Unfortunately he was reported as allergic or intolerant to many of the medications that would be helpful for him such as gabapentin, Lyrica, Celebrex and Cymbalta --> we discussed this in more detail last visit and he believes that the "allergy-hallucinations " was secondary to having multiple of these medicines on board at the same time back in 2014. We tried to start gabapentin and it did not have any side effects from medication. It has not been extremely helpful  Yet at lower doses. He admits to rather sigmnificant stomach upset starting from the naproxen BID use ober the last 8 week.  Advanced imaging has not bee able to be completed 2/2 to covid outbreak. He desired to wait on referral to specialist last visit in hope that he would receive benefit from the gabapentin.   Hypertension/HLD/overweight: Pt reports  compliance with amlodipine 10 mg daily, lisinopril 10 mg QD and HCTZ. Patient denies chest pain, shortness of breath, dizziness or lower extremity edema.  Pt takes a daily baby ASA. Pt is  prescribed statin.  Labs UTD Diet: Low-sodium Exercise: Not exercising as routinely. RF: Hypertension, hyperlipidemia, family history of heart disease. Overweight.   ANXIETY/sleep disorder:  Patient reports he is feeling well on Lexapro 20 mg daily   Gerd/constipation: He has recently been having trouble with his gastritis/GERD.  He is now on Protonix twice daily, Pepcid and Carafate.  He has upcoming GI appointment.  He states since starting the above regimen he has noticed an improvement in his stomach.  He also was started on Amitiza for chronic constipation from opioids.     06/19/2023    9:02 AM 01/18/2023   10:26 AM 11/13/2022    8:16 AM 06/15/2022    1:06 PM 11/30/2021    8:30 AM  Depression screen PHQ 2/9  Decreased Interest 0 0 0 0 0  Down, Depressed, Hopeless 0 0 0 0 0  PHQ - 2 Score 0 0 0 0 0  Altered sleeping     1  Tired, decreased energy     0  Change in appetite     1  Feeling bad or failure about yourself      0  Trouble concentrating     0  Moving slowly or fidgety/restless     0  Suicidal thoughts     0  PHQ-9 Score     2      11/30/2021    8:32 AM 01/14/2020    8:20 AM 12/16/2019    8:06 AM 06/23/2019    8:30 AM  GAD 7 : Generalized Anxiety Score  Nervous, Anxious, on Edge 0 0 0 0  Control/stop worrying 0 0 0 0  Worry too much - different things 0 0 0 0  Trouble relaxing 0 3 0 0  Restless 0 3 3 1   Easily annoyed or irritable 0 1 0 0  Afraid - awful might happen 0 0 0 0  Total GAD 7 Score 0 7 3 1   Anxiety Difficulty  Not difficult at all Somewhat difficult Not difficult at all            06/19/2023    9:02 AM 01/18/2023   10:26 AM 11/09/2022   10:50 AM 06/15/2022    1:06 PM 09/09/2021    9:24 AM  Fall Risk   Falls in the past year? 0 0 0 0 0  Number falls in past yr:  0 0  0   Injury with Fall? 0 0  0   Risk for fall due to : No Fall Risks   No Fall Risks   Follow up Falls evaluation completed Falls evaluation completed  Falls evaluation completed        There is no immunization history on file for this patient.   Past Medical History:  Diagnosis Date   Arm fracture    left. Dx'd w/RSD post fracture   Arthritis    Carpal tunnel syndrome    Chest pain 2016   [facility] (Dr. Marcelle Overlie) doing stress echo, but feels like musculoskeletal chest wall pain is cause of his discomfort  Chronic back pain    COVID-19 06/2020   Deviated septum    Diverticulosis    GERD (gastroesophageal reflux disease)    Hyperlipidemia    Hypertension    Joint pain    Muscle pain    OSA on CPAP 07/04/2018   - Trial of CPAP therapy on 17 cm H2O with a Large size [employer] Full Face Mask Simplus mask and heated humidification.   Plantar fasciitis    Pulmonary nodule 04/2016   Ling[place].  Pt chose to repeat CT chest w/out contrast in 3 mo---as per radiologist's recommendations.   Right inguinal hernia    fat-containing.  Small hydrocele in left hemiscrotum.   Sleep apnea    Snoring    Swelling    of legs/feet/hands   Tobacco dependence in remission    Allergies  Allergen Reactions   Penicillins Hives   Celebrex [Celecoxib]    Cymbalta [Duloxetine Hcl]    Lyrica [Pregabalin]    Past Surgical History:  Procedure Laterality Date   COLONOSCOPY     fracture repair left arm     had a fixator   INGUINAL HERNIA REPAIR Left 2013   repeat hernia repair. x2   KNEE ARTHROSCOPY Right    LUMBAR DISC SURGERY  1993   NEURECTOMY inguinal after hernia repair Left    PLANTAR FASCIA SURGERY Right 2000s   SHOULDER ARTHROSCOPY Right    UPPER GI ENDOSCOPY     Family History  Problem Relation Age of Onset   Lung cancer Mother    Lung cancer Father    Bone cancer Brother        bone   Heart disease Maternal Uncle    Colon cancer Neg Hx    Esophageal cancer  Neg Hx    Stomach cancer Neg Hx    Social History   Social History Narrative   Married, Designer, television/film set. Children (2) Adult, 4 grandchildren.    9 th grade, Retired.   Wears seatbelt   Smoke detector in the home.    Firearms locked in the home.    Feels safe in his relationships.     Allergies as of 06/20/2023       Reactions   Penicillins Hives   Celebrex [celecoxib]    Cymbalta [duloxetine Hcl]    Lyrica [pregabalin]         Medication List        Accurate as of June 20, 2023 11:53 AM. If you have any questions, ask your nurse or doctor.          amLODipine 10 MG tablet Commonly known as: NORVASC Take 1 tablet (10 mg total) by mouth daily.   aspirin 81 MG tablet Take 81 mg by mouth daily.   baclofen 20 MG tablet Commonly known as: LIORESAL Take 0.5-1 tablets (10-20 mg total) by mouth 3 (three) times daily.   escitalopram 20 MG tablet Commonly known as: LEXAPRO Take 1 tablet (20 mg total) by mouth daily.   famotidine 20 MG tablet Commonly known as: Pepcid Take 1 tablet (20 mg total) by mouth 2 (two) times daily.   gabapentin 600 MG tablet Commonly known as: NEURONTIN Take 1 tablet (600 mg total) by mouth 3 (three) times daily. 1 tab morning, 1.5 tabs afternoon, 1 tab evening,  doses should be every 8 hours.   hydrochlorothiazide 25 MG tablet Commonly known as: HYDRODIURIL Take 1 tablet (25 mg total) by mouth daily.   lisinopril 10 MG tablet Commonly known as: ZESTRIL  Take 1 tablet (10 mg total) by mouth daily.   lubiprostone 24 MCG capsule Commonly known as: AMITIZA Take 1 capsule (24 mcg total) by mouth 2 (two) times daily with a meal.   MegaRed Omega-3 Krill Oil 500 MG Caps Take 2 capsules by mouth daily.   Myrbetriq 50 MG Tb24 tablet Generic drug: mirabegron ER Take 50 mg by mouth daily.   oxyCODONE-acetaminophen 10-325 MG tablet Commonly known as: PERCOCET Take 1 tablet by mouth every 8 (eight) hours as needed for pain. What changed:  Another medication with the same name was changed. Make sure you understand how and when to take each. Changed by: Felix Pacini   oxyCODONE-acetaminophen 10-325 MG tablet Commonly known as: PERCOCET Take 1 tablet by mouth every 8 (eight) hours as needed for pain. Start taking on: July 19, 2023 What changed: These instructions start on July 19, 2023. If you are unsure what to do until then, ask your doctor or other care provider. Changed by: Felix Pacini   oxyCODONE-acetaminophen 10-325 MG tablet Commonly known as: PERCOCET Take 1 tablet by mouth every 8 (eight) hours as needed for pain. Start taking on: August 17, 2023 What changed: These instructions start on August 17, 2023. If you are unsure what to do until then, ask your doctor or other care provider. Changed by: Felix Pacini   pantoprazole 40 MG tablet Commonly known as: PROTONIX Take 1 tablet (40 mg total) by mouth daily.   polyethylene glycol powder 17 GM/SCOOP powder Commonly known as: GLYCOLAX/MIRALAX Take 17 g by mouth daily.   sennosides-docusate sodium 8.6-50 MG tablet Commonly known as: SENOKOT-S Take 2 tablets by mouth at bedtime as needed for constipation.   simvastatin 10 MG tablet Commonly known as: ZOCOR Take 1 tablet (10 mg total) by mouth every other day.   sucralfate 1 g tablet Commonly known as: Carafate Take 1 tablet (1 g total) by mouth 4 (four) times daily -  with meals and at bedtime.   tamsulosin 0.4 MG Caps capsule Commonly known as: FLOMAX Take 0.4 mg by mouth at bedtime.       All past medical history, surgical history, allergies, family history, immunizations andmedications were updated in the EMR today and reviewed under the history and medication portions of their EMR.      MR SHOULDER RIGHT WO CONTRAST Result Date: 05/03/2022 IMPRESSION: 1. Postsurgical changes consistent with interval rotator cuff repair and probable bicipital tenodesis with superior labral debridement.  Mild supraspinatus and infraspinatus tendinosis without recurrent rotator cuff tear or muscular atrophy. 2. Interval development of prominent subcortical cyst formation and surrounding edema inferiorly in the acromion with associated subacromial-subdeltoid bursitis. Electronically Signed   By: Carey Bullocks M.D.   On: 05/03/2022 13:24    ROS 14 pt review of systems performed and negative (unless mentioned in an HPI)  Objective: BP 120/72   Pulse 80   Temp 97.9 F (36.6 C)   Ht 6' (1.829 m)   Wt 195 lb (88.5 kg)   SpO2 97%   BMI 26.45 kg/m  Physical Exam Constitutional:      General: He is not in acute distress.    Appearance: Normal appearance. He is not ill-appearing, toxic-appearing or diaphoretic.  HENT:     Head: Normocephalic and atraumatic.     Right Ear: Tympanic membrane, ear canal and external ear normal. There is no impacted cerumen.     Left Ear: Tympanic membrane, ear canal and external ear normal. There is no impacted cerumen.  Nose: Nose normal. No congestion or rhinorrhea.     Mouth/Throat:     Mouth: Mucous membranes are moist.     Pharynx: Oropharynx is clear. No oropharyngeal exudate or posterior oropharyngeal erythema.  Eyes:     General: No scleral icterus.       Right eye: No discharge.        Left eye: No discharge.     Extraocular Movements: Extraocular movements intact.     Pupils: Pupils are equal, round, and reactive to light.  Cardiovascular:     Rate and Rhythm: Normal rate and regular rhythm.     Pulses: Normal pulses.     Heart sounds: Normal heart sounds. No murmur heard.    No friction rub. No gallop.  Pulmonary:     Effort: Pulmonary effort is normal. No respiratory distress.     Breath sounds: Normal breath sounds. No stridor. No wheezing, rhonchi or rales.  Chest:     Chest wall: No tenderness.  Abdominal:     General: Abdomen is flat. Bowel sounds are normal. There is no distension.     Palpations: Abdomen is soft. There is no  mass.     Tenderness: There is no abdominal tenderness. There is no right CVA tenderness, left CVA tenderness, guarding or rebound.     Hernia: No hernia is present.  Musculoskeletal:        General: No swelling or tenderness. Normal range of motion.     Cervical back: Normal range of motion and neck supple.     Right lower leg: No edema.     Left lower leg: No edema.  Lymphadenopathy:     Cervical: No cervical adenopathy.  Skin:    General: Skin is warm and dry.     Coloration: Skin is not jaundiced.     Findings: No bruising, lesion or rash.  Neurological:     General: No focal deficit present.     Mental Status: He is alert and oriented to person, place, and time. Mental status is at baseline.     Cranial Nerves: No cranial nerve deficit.     Sensory: No sensory deficit.     Motor: No weakness.     Coordination: Coordination normal.     Gait: Gait normal.     Deep Tendon Reflexes: Reflexes normal.  Psychiatric:        Mood and Affect: Mood normal.        Behavior: Behavior normal.        Thought Content: Thought content normal.        Judgment: Judgment normal.      No results found.  Assessment/plan: Nicholas Graham is a 72 y.o. male present for CPE and routine chronic condition management Cervical neck pain with evidence of disc disease/Chronic midline thoracic back pain/Thoracic degenerative disc disease/DDD (degenerative disc disease), lumbar/History of lumbar discectomy He is following with sports medicine, physical therapy and orthopedics.  - current pain management is helping him maintain activity and improve his quality of life. -[place] controlled substance database reviewed and appropriate 06/20/2023 - Pain contract signed > updated  - UDS > UTD NSAIDs contraindicated with gastritis -Continue gabapentin  600/600/600> did not tolerate higher dose (headache) -Continue baclofen 10-20 mg 3 times daily -Continue percocet 10-325 mg tID #90 prescribed with 2  refills (held) at pharmacy.  - f/u 3 mos   Essential hypertension, benign/dyslipidemia/overweight Stable  continue amlodipine 10 mg daily  Continue HCTZ. Continue Zocor 10 mg  q. OD - continue fish oil. - continue ASA 81 mg - routine diet and exercise. - Lipid panel - TSH   Gastroesophageal reflux disease with esophagitis Improving. Continue Protonix twice daily, Pepcid twice daily, Carafate and follow-up with GI which is scheduled. Avoid all NSAIDs.  Caution on spicy foods and alcohol use.   Anxiety/sleep disturbance:  Stable Continue  lexapro 20 mg qd.   OSA on CPAP compliant Encounter for long-term current use of medication A1c collected today Prostate cancer screening PSA collected today Need for immunization against influenza Declined today  Routine general medical examination at a health care facility Patient was encouraged to exercise greater than 150 minutes a week. Patient was encouraged to choose a diet filled with fresh fruits and vegetables, and lean meats. AVS provided to patient today for education/recommendation on gender specific health and safety maintenance. Colonoscopy: last screen 09/05/2022, recommend follow up 7 years; Dr. Adela Lank Immunizations:  tdap declined, influenza declined, PNA series declined, zostavax declined Infectious disease screening: Hep C completed.   Return in about 11 weeks (around 09/05/2023) for Routine chronic condition follow-up.   Orders Placed This Encounter  Procedures   Hemoglobin A1c   TSH   Lipid panel   PSA, Medicare ( [place] only)   Meds ordered this encounter  Medications   oxyCODONE-acetaminophen (PERCOCET) 10-325 MG tablet    Sig: Take 1 tablet by mouth every 8 (eight) hours as needed for pain.    Dispense:  90 tablet    Refill:  0   oxyCODONE-acetaminophen (PERCOCET) 10-325 MG tablet    Sig: Take 1 tablet by mouth every 8 (eight) hours as needed for pain.    Dispense:  90 tablet    Refill:  0    oxyCODONE-acetaminophen (PERCOCET) 10-325 MG tablet    Sig: Take 1 tablet by mouth every 8 (eight) hours as needed for pain.    Dispense:  90 tablet    Refill:  0   Referral Orders  No referral(s) requested today     Note is dictated utilizing voice recognition software. Although note has been proof read prior to signing, occasional typographical errors still can be missed. If any questions arise, please do not hesitate to call for verification.  Electronically signed by: Felix Pacini, DO [facility]

## 2023-06-24 ENCOUNTER — Telehealth: Payer: Self-pay | Admitting: Gastroenterology

## 2023-06-24 NOTE — Telephone Encounter (Signed)
Inbound call from patient requesting to speak with a nurse , in regards to him having a referral for Gastroesophageal reflux disease with esophagitis without hemorrhage and Epigastric pain.   Please advise.   Thank you

## 2023-06-24 NOTE — Telephone Encounter (Signed)
Dr Claiborne Billings referred patient for reflux and abdominal pain. We have a cancellation for 06/26/23 with Dr Adela Lank. Patient placed on schedule for 06/26/23. He verbalizes understanding.

## 2023-06-24 NOTE — Telephone Encounter (Signed)
Patient declined to schedule with me. Requesting to speak with a nurse . Please advise.   Thank you

## 2023-06-24 NOTE — Telephone Encounter (Signed)
Patient has a referral in the system to Dr Adela Lank for this. Please schedule from referral. Thank you.

## 2023-06-26 ENCOUNTER — Ambulatory Visit: Payer: PPO | Admitting: Gastroenterology

## 2023-06-26 ENCOUNTER — Encounter: Payer: Self-pay | Admitting: Gastroenterology

## 2023-06-26 VITALS — BP 110/70 | HR 64 | Ht 72.0 in | Wt 197.0 lb

## 2023-06-26 DIAGNOSIS — K5909 Other constipation: Secondary | ICD-10-CM

## 2023-06-26 DIAGNOSIS — R109 Unspecified abdominal pain: Secondary | ICD-10-CM | POA: Diagnosis not present

## 2023-06-26 NOTE — Patient Instructions (Addendum)
If your blood pressure at your visit was 140/90 or greater, please contact your primary care physician to follow up on this.  _______________________________________________________  If you are age 70 or older, your body mass index should be between 23-30. Your Body mass index is 26.72 kg/m. If this is out of the aforementioned range listed, please consider follow up with your Primary Care Provider.  If you are age 91 or younger, your body mass index should be between 19-25. Your Body mass index is 26.72 kg/m. If this is out of the aformentioned range listed, please consider follow up with your Primary Care Provider.   ________________________________________________________  The [facility] providers would like to encourage you to use [facility] to communicate with providers for non-urgent requests or questions.  Due to long hold times on the telephone, sending your provider a message by [facility] may be a faster and more efficient way to get a response.  Please allow 48 business hours for a response.  Please remember that this is for non-urgent requests.  _______________________________________________________  Nicholas Graham have been scheduled for an endoscopy. Please follow written instructions given to you at your visit today.  If you use inhalers (even only as needed), please bring them with you on the day of your procedure.  If you take any of the following medications, they will need to be adjusted prior to your procedure:   DO NOT TAKE 7 DAYS PRIOR TO TEST- Trulicity (dulaglutide) Ozempic, Wegovy (semaglutide) Mounjaro (tirzepatide) Bydureon Bcise (exanatide extended release)  DO NOT TAKE 1 DAY PRIOR TO YOUR TEST Rybelsus (semaglutide) Adlyxin (lixisenatide) Victoza (liraglutide) Byetta (exanatide) ___________________________________________________________________________  Continue Protonix and Pepcid.  Continue Amitiza and Senna.  Thank you for entrusting me with your care and for  choosing [facility], Dr. Ileene Patrick

## 2023-06-26 NOTE — Progress Notes (Signed)
HPI :  72 year old male with a history of colon polyps, seen previously here for his colonoscopies, here for further evaluation of abdominal pain that is bothering him.  He reports for the past 6 weeks he has had progressive worsening of abdominal discomfort.  Pain starts in his upper abdomen and migrates into his mid and even lower abdomen at times.  He feels distended with bloating at times.  He states eating definitely precipitates his symptoms and that is typically where he will feel it, right after he eats.  He can remain uncomfortable for a few hours before he gets better.  He does have some level of discomfort at all times.  Larger meals at dinner typically bother him the most.  He has had a fear of eating and thinks he has lost a few pounds since his symptoms started.  Denies any GERD, no dysphagia.  He is nauseated but does not vomit.  He denies any NSAID use.  He moves his bowels typically once daily on the regimen of Amitiza twice daily and MiraLAX, as well as senna.  He has had constipation when he does not take these medications.  He is on chronic narcotics for back pain.  He had a CT scan performed in September 12, no clear cause of his pain was noted.  He was found to have a small renal artery aneurysm, nonobstructive renal stones, enlarged prostate and a stable compression fracture of her spine which she has known about.  He last had an EGD in 2008 which showed some LA grade a esophagitis.  His gallbladder was normal on CT imaging.  He had a normal ultrasound of his gallbladder back in 2015.  He and his wife are concerned about his progressive symptoms today.  They have been empirically started on Protonix 40 mg daily and Pepcid twice daily as well as Carafate as needed.  He has been on that regimen for the past 1 to 2 weeks and states he does not think it helped too much yet.   Colonoscopy 09/05/22: - The perianal and digital rectal examinations were normal. - A diminutive polyp was  found in the cecum. The polyp was sessile. The polyp was removed with a cold snare. Resection and retrieval were complete. - A 3 mm polyp was found in the transverse colon. The polyp was sessile. The polyp was removed with a cold snare. Resection and retrieval were complete. - Many medium-mouthed diverticula were found in the left colon. - A large amount of semi-liquid stool was found in the entire colon, making visualization difficult. Lavage of the colon was performed using copious amounts of sterile water, resulting in clearance with adequate visualization. This process took several minutes and prolonged the exam. - Internal hemorrhoids were found during retroflexion. - The exam was otherwise without abnormality.  1. Surgical [P], colon, cecum, polyp (1) - POLYPOID FRAGMENT OF BENIGN COLONIC MUCOSA WITH LYMPHOID AGGREGATE 2. Surgical [P], colon, transverse, polyp (1) - TUBULAR ADENOMA. - NO HIGH GRADE DYSPLASIA OR MALIGNANCY.  Repeat in 7 years    CT abdomen  pelvis 05/30/23: IMPRESSION: 1. Prominent stool throughout the colon favors constipation. 2. 1.1 by 0.8 cm saccular aneurysm at a branch point in the right renal artery with mild rim calcification. If the patient has uncontrolled hypertension, referral to interventional radiology for consultation is recommended. In the absence of uncontrolled hypertension, imaging surveillance by CT is recommended in 2 years time. 3. Nonobstructive left nephrolithiasis. 4. Prostatomegaly. 5. Compression fracture at  T12 with near complete central loss of vertebral height, and prior vertebral augmentation. The degree of compression is mildly increased from 01/23/2023, and the vertebral augmentation is new compared to that exam. 6. Aortic atherosclerosis.     Past Medical History:  Diagnosis Date   Arm fracture    left. Dx'd w/RSD post fracture   Arthritis    Carpal tunnel syndrome    Chest pain 2016   [facility] (Dr. Marcelle Overlie) doing  stress echo, but feels like musculoskeletal chest wall pain is cause of his discomfort   Chronic back pain    COVID-19 06/2020   Deviated septum    Diverticulosis    GERD (gastroesophageal reflux disease)    Hyperlipidemia    Hypertension    Joint pain    Muscle pain    OSA on CPAP 07/04/2018   - Trial of CPAP therapy on 17 cm H2O with a Large size Fisher&Paykel Full Face Mask Simplus mask and heated humidification.   Plantar fasciitis    Pulmonary nodule 04/2016   Lingula.  Pt chose to repeat CT chest w/out contrast in 3 mo---as per radiologist's recommendations.   Right inguinal hernia    fat-containing.  Small hydrocele in left hemiscrotum.   Sleep apnea    Snoring    Swelling    of legs/feet/hands   Tobacco dependence in remission      Past Surgical History:  Procedure Laterality Date   COLONOSCOPY     fracture repair left arm     had a fixator   INGUINAL HERNIA REPAIR Left 2013   repeat hernia repair. x2   KNEE ARTHROSCOPY Right    LUMBAR DISC SURGERY  1993   NEURECTOMY inguinal after hernia repair Left    PLANTAR FASCIA SURGERY Right 2000s   SHOULDER ARTHROSCOPY Right    UPPER GI ENDOSCOPY     Family History  Problem Relation Age of Onset   Lung cancer Mother    Lung cancer Father    Bone cancer Brother        bone   Heart disease Maternal Uncle    Colon cancer Neg Hx    Esophageal cancer Neg Hx    Stomach cancer Neg Hx    Social History   Tobacco Use   Smoking status: Former    Current packs/day: 0.00    Average packs/day: 3.0 packs/day for 30.0 years (90.0 ttl pk-yrs)    Types: Cigarettes    Start date: 07/16/1972    Quit date: 07/16/2002    Years since quitting: 20.9   Smokeless tobacco: Never  Vaping Use   Vaping status: Never Used  Substance Use Topics   Alcohol use: Yes    Alcohol/week: 1.0 - 2.0 standard drink of alcohol    Types: 1 - 2 Cans of beer per week    Comment: socially   Drug use: No   Current Outpatient Medications   Medication Sig Dispense Refill   amLODipine (NORVASC) 10 MG tablet Take 1 tablet (10 mg total) by mouth daily. 90 tablet 1   aspirin 81 MG tablet Take 81 mg by mouth daily.     baclofen (LIORESAL) 20 MG tablet Take 0.5-1 tablets (10-20 mg total) by mouth 3 (three) times daily. 270 tablet 1   escitalopram (LEXAPRO) 20 MG tablet Take 1 tablet (20 mg total) by mouth daily. 90 tablet 1   famotidine (PEPCID) 20 MG tablet Take 1 tablet (20 mg total) by mouth 2 (two) times daily. 60 tablet  2   gabapentin (NEURONTIN) 600 MG tablet Take 1 tablet (600 mg total) by mouth 3 (three) times daily. 1 tab morning, 1.5 tabs afternoon, 1 tab evening,  doses should be every 8 hours. 270 tablet 1   hydrochlorothiazide (HYDRODIURIL) 25 MG tablet Take 1 tablet (25 mg total) by mouth daily. 90 tablet 1   lisinopril (ZESTRIL) 10 MG tablet Take 1 tablet (10 mg total) by mouth daily. 90 tablet 1   lubiprostone (AMITIZA) 24 MCG capsule Take 1 capsule (24 mcg total) by mouth 2 (two) times daily with a meal. 60 capsule 5   MegaRed Omega-3 Krill Oil 500 MG CAPS Take 2 capsules by mouth daily.      MYRBETRIQ 50 MG TB24 tablet Take 50 mg by mouth daily.     [START ON 08/17/2023] oxyCODONE-acetaminophen (PERCOCET) 10-325 MG tablet Take 1 tablet by mouth every 8 (eight) hours as needed for pain. 90 tablet 0   [START ON 07/19/2023] oxyCODONE-acetaminophen (PERCOCET) 10-325 MG tablet Take 1 tablet by mouth every 8 (eight) hours as needed for pain. 90 tablet 0   oxyCODONE-acetaminophen (PERCOCET) 10-325 MG tablet Take 1 tablet by mouth every 8 (eight) hours as needed for pain. 90 tablet 0   pantoprazole (PROTONIX) 40 MG tablet Take 1 tablet (40 mg total) by mouth daily. 90 tablet 3   sennosides-docusate sodium (SENOKOT-S) 8.6-50 MG tablet Take 2 tablets by mouth at bedtime as needed for constipation. 180 tablet 1   simvastatin (ZOCOR) 10 MG tablet Take 1 tablet (10 mg total) by mouth every other day. 45 tablet 1   sucralfate  (CARAFATE) 1 g tablet Take 1 tablet (1 g total) by mouth 4 (four) times daily -  with meals and at bedtime. 120 tablet 0   No current facility-administered medications for this visit.   Allergies  Allergen Reactions   Penicillins Hives   Celebrex [Celecoxib]    Cymbalta [Duloxetine Hcl]    Lyrica [Pregabalin]      Review of Systems: All systems reviewed and negative except where noted in HPI.    [place] LIMITED JOINT SPACE STRUCTURES LOW RIGHT(NO LINKED CHARGES)  Result Date: 06/14/2023 Procedure: Real-time Ultrasound Guided Injection of right lateral hip greater trochanter bursa Device: Philips Affiniti 50G/GE Logiq Images permanently stored and available for review in PACS Verbal informed consent obtained.  Discussed risks and benefits of procedure. Warned about infection, bleeding, hyperglycemia damage to structures among others. Patient expresses understanding and agreement Time-out conducted.  Noted no overlying erythema, induration, or other signs of local infection.  Skin prepped in a sterile fashion.  Local anesthesia: Topical Ethyl chloride.  With sterile technique and under real time ultrasound guidance: 40 mg of Kenalog and 2 mL of Marcaine injected into greater trochanter bursa. Fluid seen entering the bursa.  Completed without difficulty  Pain immediately resolved suggesting accurate placement of the medication.  Advised to call if fevers/chills, erythema, induration, drainage, or persistent bleeding.  Images permanently stored and available for review in the ultrasound unit. Impression: Technically successful ultrasound guided injection.    CT ABDOMEN PELVIS W CONTRAST  Result Date: 05/30/2023 CLINICAL DATA:  Abdominal pain over the last 4 weeks.  Constipation. EXAM: CT ABDOMEN AND PELVIS WITH CONTRAST TECHNIQUE: Multidetector CT imaging of the abdomen and pelvis was performed using the standard protocol following bolus administration of intravenous contrast. RADIATION DOSE REDUCTION:  This exam was performed according to the departmental dose-optimization program which includes automated exposure control, adjustment of the mA and/or kV according  to patient size and/or use of iterative reconstruction technique. CONTRAST:  OMNIPAQUE IOHEXOL 300 MG/ML  SOLN COMPARISON:  04/30/2016 FINDINGS: Lower chest: Mitral valve calcification. Descending thoracic aortic atheromatous vascular calcification. The vertical level of the calcified lingular nodule shown on 04/30/2016 was not included on today's examination. Hepatobiliary: Punctate calcifications in the left hepatic lobe compatible with old granulomatous disease. Gallbladder unremarkable. No biliary dilatation. Pancreas: Unremarkable Spleen: Punctate calcifications compatible with old granulomatous disease. Adrenals/Urinary Tract: 5 mm in length cluster of two nonobstructive renal calculi in the left kidney lower pole on image 68 series 601. Adrenal glands unremarkable.  Urinary bladder unremarkable. Stomach/Bowel: Small periampullary duodenal diverticulum. Prominent stool throughout the colon favors constipation. Descending colon diverticulosis. Mild sigmoid colon diverticulosis. Vascular/Lymphatic: 1.1 by 0.8 cm saccular aneurysm at a branch point in the right renal artery with mild rim calcification, image 29 series 301. If the patient has uncontrolled hypertension, referral to interventional radiology for consultation is recommended. In the absence of uncontrolled hypertension, imaging surveillance by CT is recommended in 2 years time. Atherosclerosis is present, including aortoiliac atherosclerotic disease. Reproductive: Prostatomegaly. Stable central calcification along the transition zone of the prostate gland. Other: No supplemental non-categorized findings. Musculoskeletal: Compression fracture at T12 with near complete central loss of vertebral height, and prior vertebral augmentation. 4 mm posterior bony retropulsion. Surrounding  sclerosis. The degree of compression is mildly increased from 01/23/2023, and the vertebral augmentation is new compared to that exam. Grade 1 degenerative retrolisthesis at L2-3. Endplate sclerosis and interbody spurring at L4-5 along with degenerative facet spurring. IMPRESSION: 1. Prominent stool throughout the colon favors constipation. 2. 1.1 by 0.8 cm saccular aneurysm at a branch point in the right renal artery with mild rim calcification. If the patient has uncontrolled hypertension, referral to interventional radiology for consultation is recommended. In the absence of uncontrolled hypertension, imaging surveillance by CT is recommended in 2 years time. 3. Nonobstructive left nephrolithiasis. 4. Prostatomegaly. 5. Compression fracture at T12 with near complete central loss of vertebral height, and prior vertebral augmentation. The degree of compression is mildly increased from 01/23/2023, and the vertebral augmentation is new compared to that exam. 6. Aortic atherosclerosis. Aortic Atherosclerosis (ICD10-I70.0). Electronically Signed   By: Gaylyn Rong M.D.   On: 05/30/2023 15:10    Lab Results  Component Value Date   WBC 5.8 05/29/2023   HGB 15.9 05/29/2023   HCT 48.2 05/29/2023   MCV 92.2 05/29/2023   PLT 163.0 05/29/2023    Lab Results  Component Value Date   NA 138 05/29/2023   CL 100 05/29/2023   K 4.1 05/29/2023   CO2 30 05/29/2023   BUN 19 05/29/2023   CREATININE 1.01 05/29/2023   GFR 75.69 05/29/2023   CALCIUM 9.8 05/29/2023   PHOS 2.1 (L) 07/29/2013   ALBUMIN 4.5 05/29/2023   GLUCOSE 95 05/29/2023    Lab Results  Component Value Date   ALT 13 05/29/2023   AST 21 05/29/2023   ALKPHOS 64 05/29/2023   BILITOT 0.8 05/29/2023     Physical Exam: BP 110/70   Pulse 64   Ht 6' (1.829 m)   Wt 197 lb (89.4 kg)   BMI 26.72 kg/m  Constitutional: Pleasant,well-developed, male in no acute distress. HEENT: Normocephalic and atraumatic. Conjunctivae are normal. No  scleral icterus. Neck supple.  Cardiovascular: Normal rate, regular rhythm.  Pulmonary/chest: Effort normal and breath sounds normal. No wheezing, rales or rhonchi. Abdominal: Soft, nondistended, tenderness to upper and mid abdomen with palpation. There are  no masses palpable. No hepatomegaly. Extremities: no edema Lymphadenopathy: No cervical adenopathy noted. Neurological: Alert and oriented to person place and time. Skin: Skin is warm and dry. No rashes noted. Psychiatric: Normal mood and affect. Behavior is normal.   ASSESSMENT: 72 y.o. male here for assessment of the following  1. Abdominal pain, unspecified abdominal location   2. Chronic constipation    As above, few weeks worth of significant postprandial abdominal pain.  Appears to start in the epigastric area and migrates lower down into his abdomen.  Basic labs around onset of symptoms on September 11 have been normal.  CT scan has not shown a clear cause for her symptoms.  He is on chronic narcotics for back pain and associated constipation.  He is on a regimen of Amitiza, MiraLAX and senna which works really well for him, he has been moving his bowels regularly and does not feel this is related to his constipation.  We discussed differential diagnosis.  Given his tobacco history he is certainly at risk for mesenteric ischemia however his initial CT scan with contrast did not show any evidence of this.  Given his pain starting in the epigastric area and reliably postprandially, PUD, gastritis, biliary colic all the differential.  Recommending an upper endoscopy to initially evaluate this.  Discussed risks and benefits of the exam and anesthesia and he wants to proceed.  If this is negative we may consider right upper quadrant ultrasound, and may review his imaging with radiology to ensure no evidence of mesenteric ischemia on his prior CT scan.  Fortunately we had an opening to do the upper endoscopy tomorrow to quickly turn this  around for him.  He will continue his Protonix and Pepcid for now.  Further recommendations based on the results of his upper endoscopy and course.  He agrees   PLAN: - EGD scheduled to be done at the [facility] tomorrow AM - continue protonix / pepcid for now - consider RUQ [place] if negative, will also review CT with radiology to ensure no evidence of mesenteric ischemia - continue Amitiza and Miralax / senna for bowel regimen for now  Harlin Rain, MD [facility]

## 2023-06-27 ENCOUNTER — Encounter: Payer: Self-pay | Admitting: Gastroenterology

## 2023-06-27 ENCOUNTER — Ambulatory Visit: Payer: PPO | Admitting: Gastroenterology

## 2023-06-27 ENCOUNTER — Telehealth: Payer: Self-pay | Admitting: *Deleted

## 2023-06-27 VITALS — BP 136/78 | HR 56 | Temp 97.4°F | Resp 11 | Ht 72.0 in | Wt 197.0 lb

## 2023-06-27 DIAGNOSIS — R109 Unspecified abdominal pain: Secondary | ICD-10-CM

## 2023-06-27 DIAGNOSIS — K3189 Other diseases of stomach and duodenum: Secondary | ICD-10-CM

## 2023-06-27 DIAGNOSIS — K319 Disease of stomach and duodenum, unspecified: Secondary | ICD-10-CM | POA: Diagnosis not present

## 2023-06-27 DIAGNOSIS — G4733 Obstructive sleep apnea (adult) (pediatric): Secondary | ICD-10-CM | POA: Diagnosis not present

## 2023-06-27 MED ORDER — SODIUM CHLORIDE 0.9 % IV SOLN: 500.0000 mL | Freq: Once | INTRAVENOUS | Status: DC

## 2023-06-27 NOTE — Telephone Encounter (Signed)
Patient has been scheduled for RUQ abdominal ultrasound at [facility] 06/28/23 at 830 am, 8 am arrival; npo midnight. Patient has been advised of this information and verbalizes understanding.

## 2023-06-27 NOTE — Progress Notes (Signed)
Report given to PACU, vss 

## 2023-06-27 NOTE — Op Note (Signed)
[facility] Patient Name: Nicholas Graham Procedure Date: 06/27/2023 10:47 AM MRN: [id number] Endoscopist: Viviann Spare P. Adela Lank , MD, [id number] Age: 72 Referring MD:  Date of Birth: [birth year] Gender: Male Account #: [id number] Procedure:                Upper GI endoscopy Indications:              Post prandial upper abdominal pain - CT scan                            without clear cause. Not much benefit with protonix                            / pepcid yet. Medicines:                Monitored Anesthesia Care Procedure:                Pre-Anesthesia Assessment:                           - Prior to the procedure, a History and Physical                            was performed, and patient medications and                            allergies were reviewed. The patient's tolerance of                            previous anesthesia was also reviewed. The risks                            and benefits of the procedure and the sedation                            options and risks were discussed with the patient.                            All questions were answered, and informed consent                            was obtained. Prior Anticoagulants: The patient has                            taken no anticoagulant or antiplatelet agents. ASA                            Grade Assessment: II - A patient with mild systemic                            disease. After reviewing the risks and benefits,                            the patient was deemed in satisfactory condition to  undergo the procedure.                           After obtaining informed consent, the endoscope was                            passed under direct vision. Throughout the                            procedure, the patient's blood pressure, pulse, and                            oxygen saturations were monitored continuously. The                            GIF [id number] #[id number] was introduced  through the                            mouth, and advanced to the second part of duodenum.                            The upper GI endoscopy was accomplished without                            difficulty. The patient tolerated the procedure                            well. Scope In: Scope Out: Findings:                 Esophagogastric landmarks were identified: the                            Z-line was found at 40 cm, the gastroesophageal                            junction was found at 40 cm and the upper extent of                            the gastric folds was found at 42 cm from the                            incisors.                           A 2 cm hiatal hernia was present.                           The exam of the esophagus was otherwise normal.                           A single 6 to 7 mm subepithelial papule was found                            in the gastric antrum.  Bite on bite biopsies were                            taken with a cold forceps for histology.                           The exam of the stomach was otherwise normal.                           Biopsies were taken with a cold forceps for                            Helicobacter pylori testing.                           The examined duodenum was normal. Complications:            No immediate complications. Estimated blood loss:                            Minimal. Estimated Blood Loss:     Estimated blood loss was minimal. Impression:               - Esophagogastric landmarks identified.                           - 2 cm hiatal hernia.                           - Normal esophagus otherwise.                           - A single subepithelial papule (nodule) found in                            the stomach. Biopsied.                           - Normal stomach otherwise. Biopsies taken to rule                            out H pylori.                           - Normal examined duodenum.                           No  cause for symptoms on EGD. Biliary colic remains                            on the differential although symptoms not classic                            for it (pain radiates to mid / lower abdomen).                            Mesenteric ischemia also  possible although initial                            CT did not show this, will need to review with                            radiology to see if CTA would be useful. Recommendation:           - Patient has a contact number available for                            emergencies. The signs and symptoms of potential                            delayed complications were discussed with the                            patient. Return to normal activities tomorrow.                            Written discharge instructions were provided to the                            patient.                           - Resume previous diet.                           - Continue present medications.                           - I think okay to stop protonix / pepcid given that                            has not helped and no findings on EGD                           - Await pathology results.                           - Will coordinate RUQ [place] to evaluate gallbladder                           - Will review prior CT with radiology, see if CTA                            would be useful at all [facility]. Huma Imhoff, MD 06/27/2023 11:06:23 AM This report has been signed electronically.

## 2023-06-27 NOTE — Progress Notes (Signed)
History and Physical Interval Note: Seen yesterday in the office - no interval changes. Ongoing postprandial upper to mid abdominal pain. CT without clear cause. On protonix / pepcid. EGD to further evaluate.     06/27/2023 10:32 AM  Nicholas Graham  has presented today for endoscopic procedure(s), with the diagnosis of  Encounter Diagnosis  Name Primary?   Abdominal pain, unspecified abdominal location Yes  .  The various methods of evaluation and treatment have been discussed with the patient and/or family. After consideration of risks, benefits and other options for treatment, the patient has consented to  the endoscopic procedure(s).   The patient's history has been reviewed, patient examined, no change in status, stable for surgery.  I have reviewed the patient's chart and labs.  Questions were answered to the patient's satisfaction.    Harlin Rain, MD [facility]

## 2023-06-27 NOTE — Telephone Encounter (Signed)
-----   Message from Benancio Deeds sent at 06/27/2023 12:42 PM EDT ----- Regarding: RUQ [place] order Dottie can you help order this patient a RUQ [place] to be done ASAP. Rule out gallstones - abdominal pain? Thanks

## 2023-06-27 NOTE — Progress Notes (Signed)
Called to room to assist during endoscopic procedure.  Patient ID and intended procedure confirmed with present staff. Received instructions for my participation in the procedure from the performing physician.  

## 2023-06-27 NOTE — Patient Instructions (Signed)
Await pathology results.  Continue present medications.  Resume previous diet.  Right upper quadrant ultrasound is recommended to evaluate gallbladder (Dr. Lanetta Inch office will be in touch with you regarding scheduling).  Handout on hiatal hernia provided.  YOU HAD AN ENDOSCOPIC PROCEDURE TODAY AT THE [facility]:   Refer to the procedure report that was given to you for any specific questions about what was found during the examination.  If the procedure report does not answer your questions, please call your gastroenterologist to clarify.  If you requested that your care partner not be given the details of your procedure findings, then the procedure report has been included in a sealed envelope for you to review at your convenience later.  YOU SHOULD EXPECT: Some feelings of bloating in the abdomen. Passage of more gas than usual.  Walking can help get rid of the air that was put into your GI tract during the procedure and reduce the bloating. If you had a lower endoscopy (such as a colonoscopy or flexible sigmoidoscopy) you may notice spotting of blood in your stool or on the toilet paper. If you underwent a bowel prep for your procedure, you may not have a normal bowel movement for a few days.  Please Note:  You might notice some irritation and congestion in your nose or some drainage.  This is from the oxygen used during your procedure.  There is no need for concern and it should clear up in a day or so.  SYMPTOMS TO REPORT IMMEDIATELY:  Following upper endoscopy (EGD)  Vomiting of blood or coffee ground material  New chest pain or pain under the shoulder blades  Painful or persistently difficult swallowing  New shortness of breath  Fever of 100F or higher  Black, tarry-looking stools  For urgent or emergent issues, a gastroenterologist can be reached at any hour by calling [phone]. Do not use MyChart messaging for urgent concerns.    DIET:  We do recommend  a small meal at first, but then you may proceed to your regular diet.  Drink plenty of fluids but you should avoid alcoholic beverages for 24 hours.  ACTIVITY:  You should plan to take it easy for the rest of today and you should NOT DRIVE or use heavy machinery until tomorrow (because of the sedation medicines used during the test).    FOLLOW UP: Our staff will call the number listed on your records the next business day following your procedure.  We will call around 7:15- 8:00 am to check on you and address any questions or concerns that you may have regarding the information given to you following your procedure. If we do not reach you, we will leave a message.     If any biopsies were taken you will be contacted by phone or by letter within the next 1-3 weeks.  Please call us at [phone] if you have not heard about the biopsies in 3 weeks.    SIGNATURES/CONFIDENTIALITY: You and/or your care partner have signed paperwork which will be entered into your electronic medical record.  These signatures attest to the fact that that the information above on your After Visit Summary has been reviewed and is understood.  Full responsibility of the confidentiality of this discharge information lies with you and/or your care-partner.

## 2023-06-28 ENCOUNTER — Ambulatory Visit (HOSPITAL_COMMUNITY)
Admission: RE | Admit: 2023-06-28 | Discharge: 2023-06-28 | Disposition: A | Discharge status: Home / Self Care | Payer: PPO | Source: Ambulatory Visit | Attending: Gastroenterology | Admitting: Gastroenterology

## 2023-06-28 ENCOUNTER — Telehealth: Payer: Self-pay

## 2023-06-28 DIAGNOSIS — R109 Unspecified abdominal pain: Secondary | ICD-10-CM | POA: Diagnosis not present

## 2023-06-28 NOTE — Telephone Encounter (Signed)
  Follow up Call-     06/27/2023   10:02 AM 09/05/2022    2:51 PM  Call back number  Post procedure Call Back phone  # [phone]  Permission to leave phone message Yes Yes     Patient questions:  Do you have a fever, pain , or abdominal swelling? No. Pain Score  0 *  Have you tolerated food without any problems? Yes.    Have you been able to return to your normal activities? Yes.    Do you have any questions about your discharge instructions: Diet   No. Medications  No. Follow up visit  No.  Do you have questions or concerns about your Care? No.  Actions: * If pain score is 4 or above: No action needed, pain <4.

## 2023-07-01 LAB — SURGICAL PATHOLOGY

## 2023-07-02 ENCOUNTER — Other Ambulatory Visit: Payer: Self-pay | Admitting: *Deleted

## 2023-07-02 DIAGNOSIS — I722 Aneurysm of renal artery: Secondary | ICD-10-CM

## 2023-07-02 DIAGNOSIS — R109 Unspecified abdominal pain: Secondary | ICD-10-CM

## 2023-07-04 ENCOUNTER — Other Ambulatory Visit: Payer: PPO

## 2023-07-04 ENCOUNTER — Ambulatory Visit (HOSPITAL_COMMUNITY)
Admission: RE | Admit: 2023-07-04 | Discharge: 2023-07-04 | Disposition: A | Discharge status: Home / Self Care | Payer: PPO | Source: Ambulatory Visit | Attending: Gastroenterology | Admitting: Gastroenterology

## 2023-07-04 DIAGNOSIS — I700 Atherosclerosis of aorta: Secondary | ICD-10-CM | POA: Diagnosis not present

## 2023-07-04 DIAGNOSIS — R109 Unspecified abdominal pain: Secondary | ICD-10-CM

## 2023-07-04 DIAGNOSIS — N200 Calculus of kidney: Secondary | ICD-10-CM | POA: Diagnosis not present

## 2023-07-04 DIAGNOSIS — K5730 Diverticulosis of large intestine without perforation or abscess without bleeding: Secondary | ICD-10-CM | POA: Diagnosis not present

## 2023-07-04 LAB — CBC WITH DIFFERENTIAL/PLATELET
Basophils Absolute: 0 10*3/uL (ref 0.0–0.1)
Basophils Relative: 0.6 % (ref 0.0–3.0)
Eosinophils Absolute: 0.1 10*3/uL (ref 0.0–0.7)
Eosinophils Relative: 1.1 % (ref 0.0–5.0)
HCT: 44.4 % (ref 39.0–52.0)
Hemoglobin: 14.6 g/dL (ref 13.0–17.0)
Lymphocytes Relative: 31.9 % (ref 12.0–46.0)
Lymphs Abs: 1.8 10*3/uL (ref 0.7–4.0)
MCHC: 32.9 g/dL (ref 30.0–36.0)
MCV: 91.5 fL (ref 78.0–100.0)
Monocytes Absolute: 0.5 10*3/uL (ref 0.1–1.0)
Monocytes Relative: 9.4 % (ref 3.0–12.0)
Neutro Abs: 3.3 10*3/uL (ref 1.4–7.7)
Neutrophils Relative %: 57 % (ref 43.0–77.0)
Platelets: 145 10*3/uL — ABNORMAL LOW (ref 150.0–400.0)
RBC: 4.85 Mil/uL (ref 4.22–5.81)
RDW: 13.8 % (ref 11.5–15.5)
WBC: 5.7 10*3/uL (ref 4.0–10.5)

## 2023-07-04 LAB — COMPREHENSIVE METABOLIC PANEL
ALT: 13 U/L (ref 0–53)
AST: 20 U/L (ref 0–37)
Albumin: 4.2 g/dL (ref 3.5–5.2)
Alkaline Phosphatase: 55 U/L (ref 39–117)
BUN: 15 mg/dL (ref 6–23)
CO2: 30 meq/L (ref 19–32)
Calcium: 9.4 mg/dL (ref 8.4–10.5)
Chloride: 102 meq/L (ref 96–112)
Creatinine, Ser: 0.96 mg/dL (ref 0.40–1.50)
GFR: 80.39 mL/min (ref >60.00)
Glucose, Bld: 70 mg/dL (ref 70–99)
Potassium: 3.3 meq/L — ABNORMAL LOW (ref 3.5–5.1)
Sodium: 140 meq/L (ref 135–145)
Total Bilirubin: 0.6 mg/dL (ref 0.2–1.2)
Total Protein: 6.5 g/dL (ref 6.0–8.3)

## 2023-07-04 LAB — LIPASE: Lipase: 18 U/L (ref 11.0–59.0)

## 2023-07-04 MED ORDER — IOHEXOL 350 MG/ML SOLN
100.0000 mL | Freq: Once | INTRAVENOUS | Status: AC | PRN
  Administered 2023-07-04: 100 mL via INTRAVENOUS

## 2023-07-04 MED ORDER — SODIUM CHLORIDE (PF) 0.9 % IJ SOLN
INTRAMUSCULAR | Status: AC
  Filled 2023-07-04: qty 50

## 2023-07-10 ENCOUNTER — Encounter: Payer: PPO | Admitting: Family Medicine

## 2023-07-15 ENCOUNTER — Other Ambulatory Visit: Payer: PPO

## 2023-07-17 DIAGNOSIS — H2513 Age-related nuclear cataract, bilateral: Secondary | ICD-10-CM | POA: Diagnosis not present

## 2023-07-22 ENCOUNTER — Ambulatory Visit
Admission: RE | Admit: 2023-07-22 | Discharge: 2023-07-22 | Disposition: A | Discharge status: Home / Self Care | Payer: PPO | Source: Ambulatory Visit | Attending: Gastroenterology | Admitting: Gastroenterology

## 2023-07-22 DIAGNOSIS — I722 Aneurysm of renal artery: Secondary | ICD-10-CM | POA: Diagnosis not present

## 2023-07-22 DIAGNOSIS — R109 Unspecified abdominal pain: Secondary | ICD-10-CM

## 2023-07-22 HISTORY — PX: IR RADIOLOGIST EVAL & MGMT: IMG5224

## 2023-07-22 NOTE — Consult Note (Signed)
Chief Complaint:  Small right renal artery aneurysm   Referring Physician(s): Armbruster,Steven P  History of Present Illness: Nicholas Graham is a 72 y.o. male with a chronic history of abdominal discomfort and pain.  He is closely followed by Dr. Adela Lank and gastroenterology for this.  This has been extensively worked up including endoscopy and CT scans.  There was concern that his symptoms of abdominal pain were resulting in a "fear of food/eating".  He had also lost some weight recently.  This prompted a CTA to evaluate for mesenteric ischemia.  The CTA demonstrated no evidence of significant mesenteric vascular disease.  The celiac, SMA and IMA are all patent.  He does have minor Atherosclerosis of the renal origins and the abdominal aorta as well as iliac vessels.  Additionally, his CT demonstrated a 10 mm saccular right renal artery aneurysm with some rim calcification.  In review of his imaging timeline, he has a remote CT from 03/09/2009 which also demonstrated the small 10 mm saccular right renal artery aneurysm in retrospect.  This confirms stability over 14 years.  No signs of significant thrombus formation, peripheral emboli, renal infarct, rupture or hemorrhage.    Past Medical History:  Diagnosis Date   Arm fracture    left. Dx'd w/RSD post fracture   Arthritis    Carpal tunnel syndrome    Chest pain 2016   [facility] (Dr. Marcelle Overlie) doing stress echo, but feels like musculoskeletal chest wall pain is cause of his discomfort   Chronic back pain    COVID-19 06/2020   Deviated septum    Diverticulosis    GERD (gastroesophageal reflux disease)    Hyperlipidemia    Hypertension    Joint pain    Muscle pain    OSA on CPAP 07/04/2018   - Trial of CPAP therapy on 17 cm H2O with a Large size Fisher[employer]Paykel Full Face Mask Simplus mask and heated humidification.   Plantar fasciitis    Pulmonary nodule 04/2016   [place].  Pt chose to repeat CT chest w/out  contrast in 3 mo---as per radiologist's recommendations.   Right inguinal hernia    fat-containing.  Small hydrocele in left hemiscrotum.   Sleep apnea    Snoring    Swelling    of legs/feet/hands   Tobacco dependence in remission     Past Surgical History:  Procedure Laterality Date   COLONOSCOPY     fracture repair left arm     had a fixator   INGUINAL HERNIA REPAIR Left 2013   repeat hernia repair. x2   KNEE ARTHROSCOPY Right    LUMBAR DISC SURGERY  1993   NEURECTOMY inguinal after hernia repair Left    PLANTAR FASCIA SURGERY Right 2000s   SHOULDER ARTHROSCOPY Right    UPPER GI ENDOSCOPY      Allergies: Penicillins, Celebrex [celecoxib], Cymbalta [duloxetine hcl], and Lyrica [pregabalin]  Medications: Prior to Admission medications   Medication Sig Start Date End Date Taking? Authorizing Provider  amLODipine (NORVASC) 10 MG tablet Take 1 tablet (10 mg total) by mouth daily. 05/29/23   Kuneff, Renee A, DO  aspirin 81 MG tablet Take 81 mg by mouth daily.    [provider]  baclofen (LIORESAL) 20 MG tablet Take 0.5-1 tablets (10-20 mg total) by mouth 3 (three) times daily. 05/29/23   Kuneff, Renee A, DO  escitalopram (LEXAPRO) 20 MG tablet Take 1 tablet (20 mg total) by mouth daily. 05/29/23  Kuneff, Renee A, DO  famotidine (PEPCID) 20 MG tablet Take 1 tablet (20 mg total) by mouth 2 (two) times daily. 06/10/23   Kuneff, Renee A, DO  gabapentin (NEURONTIN) 600 MG tablet Take 1 tablet (600 mg total) by mouth 3 (three) times daily. 1 tab morning, 1.5 tabs afternoon, 1 tab evening,  doses should be every 8 hours. 05/29/23   Kuneff, Renee A, DO  hydrochlorothiazide (HYDRODIURIL) 25 MG tablet Take 1 tablet (25 mg total) by mouth daily. 05/29/23   Kuneff, Renee A, DO  lisinopril (ZESTRIL) 10 MG tablet Take 1 tablet (10 mg total) by mouth daily. 05/29/23   Kuneff, Renee A, DO  lubiprostone (AMITIZA) 24 MCG capsule Take 1 capsule (24 mcg total) by mouth 2 (two) times daily with a  meal. 06/10/23   Kuneff, Renee A, DO  MegaRed Omega-3 Krill Oil 500 MG CAPS Take 2 capsules by mouth daily.     [provider]  MYRBETRIQ 50 MG TB24 tablet Take 50 mg by mouth daily. 05/17/23   [provider]  oxyCODONE-acetaminophen (PERCOCET) 10-325 MG tablet Take 1 tablet by mouth every 8 (eight) hours as needed for pain. 08/17/23   Kuneff, Renee A, DO  sennosides-docusate sodium (SENOKOT-S) 8.6-50 MG tablet Take 2 tablets by mouth at bedtime as needed for constipation. 05/29/23   Kuneff, Renee A, DO  simvastatin (ZOCOR) 10 MG tablet Take 1 tablet (10 mg total) by mouth every other day. 05/29/23   Kuneff, Renee A, DO  sucralfate (CARAFATE) 1 g tablet Take 1 tablet (1 g total) by mouth 4 (four) times daily -  with meals and at bedtime. 06/10/23   Natalia Leatherwood, DO     Family History  Problem Relation Age of Onset   Lung cancer Mother    Lung cancer Father    Bone cancer Brother        bone   Heart disease Maternal Uncle    Colon cancer Neg Hx    Esophageal cancer Neg Hx    Stomach cancer Neg Hx     Social History   Socioeconomic History   Marital status: Married    Spouse name: Not on file   Number of children: 2   Years of education: Not on file   Highest education level: 8th grade  Occupational History   Not on file  Tobacco Use   Smoking status: Former    Current packs/day: 0.00    Average packs/day: 3.0 packs/day for 30.0 years (90.0 ttl pk-yrs)    Types: Cigarettes    Start date: 07/16/1972    Quit date: 07/16/2002    Years since quitting: 21.0   Smokeless tobacco: Never  Vaping Use   Vaping status: Never Used  Substance and Sexual Activity   Alcohol use: Yes    Alcohol/week: 1.0 - 2.0 standard drink of alcohol    Types: 1 - 2 Cans of beer per week    Comment: socially   Drug use: No   Sexual activity: Yes  Other Topics Concern   Not on file  Social History Narrative   Married, Natalia Leatherwood. Children (2) Adult, 4 grandchildren.    9 th grade,  Retired.   Wears seatbelt   Smoke detector in the home.    Firearms locked in the home.    Feels safe in his relationships.    Social Determinants of Health   Financial Resource Strain: Low Risk  (06/19/2023)   Overall Financial Resource Strain (CARDIA)  Difficulty of Paying Living Expenses: Not hard at all  Food Insecurity: No Food Insecurity (06/19/2023)   Hunger Vital Sign    Worried About Running Out of Food in the Last Year: Never true    Ran Out of Food in the Last Year: Never true  Transportation Needs: No Transportation Needs (06/19/2023)   PRAPARE - Administrator, Civil Service (Medical): No    Lack of Transportation (Non-Medical): No  Physical Activity: Sufficiently Active (06/19/2023)   Exercise Vital Sign    Days of Exercise per Week: 7 days    Minutes of Exercise per Session: 70 min  Stress: No Stress Concern Present (06/19/2023)   [employer] of Occupational Health - Occupational Stress Questionnaire    Feeling of Stress : Not at all  Social Connections: Moderately Isolated (06/19/2023)   Social Connection and Isolation Panel [NHANES]    Frequency of Communication with Friends and Family: Three times a week    Frequency of Social Gatherings with Friends and Family: Once a week    Attends Religious Services: Never    Database administrator or Organizations: No    Attends Engineer, structural: Never    Marital Status: Married     Review of Systems: A 12 point ROS discussed and pertinent positives are indicated in the HPI above.  All other systems are negative.  Review of Systems  Vital Signs: BP 135/79   Pulse 78   Temp 98.4 F (36.9 C)   SpO2 98%      Physical Exam Constitutional:      General: He is not in acute distress.    Appearance: He is normal weight. He is not ill-appearing or toxic-appearing.  Eyes:     General: No scleral icterus.    Conjunctiva/sclera: Conjunctivae normal.  Cardiovascular:     Rate and Rhythm:  Normal rate and regular rhythm.  Pulmonary:     Effort: Pulmonary effort is normal.     Breath sounds: Normal breath sounds.  Abdominal:     General: Abdomen is flat. Bowel sounds are normal. There is no distension.     Palpations: Abdomen is soft. There is no mass.     Tenderness: There is no abdominal tenderness.  Skin:    General: Skin is warm and dry.     Coloration: Skin is not jaundiced.  Neurological:     General: No focal deficit present.     Mental Status: Mental status is at baseline.  Psychiatric:        Mood and Affect: Mood normal.        Thought Content: Thought content normal.          Imaging: CT Angio Abd/Pel w/ and/or w/o  Result Date: 07/13/2023 CLINICAL DATA:  Abdominal pain.  Evaluate for mesenteric ischemia. EXAM: CTA ABDOMEN AND PELVIS WITHOUT AND WITH CONTRAST TECHNIQUE: Multidetector CT imaging of the abdomen and pelvis was performed using the standard protocol during bolus administration of intravenous contrast. Multiplanar reconstructed images and MIPs were obtained and reviewed to evaluate the vascular anatomy. RADIATION DOSE REDUCTION: This exam was performed according to the departmental dose-optimization program which includes automated exposure control, adjustment of the mA and/or kV according to patient size and/or use of iterative reconstruction technique. CONTRAST:  OMNIPAQUE IOHEXOL 350 MG/ML SOLN COMPARISON:  CT scan of the abdomen and pelvis dated 05/30/2023 FINDINGS: VASCULAR Aorta: Normal caliber aorta without aneurysm, dissection, vasculitis or significant stenosis. Scattered atherosclerotic vascular calcifications. Celiac: Patent  without evidence of aneurysm, dissection, vasculitis or significant stenosis. SMA: Patent without evidence of aneurysm, dissection, vasculitis or significant stenosis. Renals: Both renal arteries are patent without evidence of aneurysm, dissection, vasculitis, fibromuscular dysplasia or significant stenosis. IMA:  Patent without evidence of aneurysm, dissection, vasculitis or significant stenosis. Inflow: Patent without evidence of aneurysm, dissection, vasculitis or significant stenosis. Proximal Outflow: Bilateral common femoral and visualized portions of the superficial and profunda femoral arteries are patent without evidence of aneurysm, dissection, vasculitis or significant stenosis. Veins: No obvious venous abnormality within the limitations of this arterial phase study. Review of the MIP images confirms the above findings. NON-VASCULAR Lower chest: No acute abnormality. Hepatobiliary: Normal hepatic contour and morphology. Small calcifications present in hepatic segment 4 a likely reflect sequelae of old granulomatous disease. Gallbladder is unremarkable. No intra or extrahepatic biliary ductal dilatation. Pancreas: Unremarkable. No pancreatic ductal dilatation or surrounding inflammatory changes. Spleen: Normal in size. Punctate calcifications consistent with old granulomatous disease. Adrenals/Urinary Tract: Normal adrenal glands. No hydronephrosis, or enhancing renal mass. 3 mm nonobstructing stone in the lower pole collecting system of the left kidney. The ureters and bladder are unremarkable. Stomach/Bowel: Colonic diverticular disease without CT evidence of active inflammation. No focal bowel wall thickening or evidence of obstruction. Lymphatic: No suspicious lymphadenopathy. Reproductive: Prostatomegaly. Other: No abdominal wall hernia or abnormality. No abdominopelvic ascites. Musculoskeletal: T12 compression fracture status post kyphoplasty. Lower lumbar degenerative disc disease and facet arthropathy. IMPRESSION: VASCULAR 1. No significant stenosis or occlusion of any of the 3 visceral arteries to suggest a source for chronic mesenteric ischemia. 2.  Aortic Atherosclerosis (ICD10-I70.0). NON-VASCULAR 1. No acute or clinically significant abnormality within the abdomen or pelvis to explain the patient's  clinical symptoms. 2. Colonic diverticular disease without CT evidence of active inflammation. 3. Moderate colonic stool burden suggests constipation. 4. Prior T12 compression fracture status post kyphoplasty. 5. Lower lumbar degenerative disc disease and facet arthropathy. 6. Additional ancillary findings as above. Electronically Signed   By: Malachy Moan M.D.   On: 07/13/2023 08:21   US Abdomen Limited RUQ (LIVER/GB)  Result Date: 06/28/2023 CLINICAL DATA:  Progressive abdominal pain. EXAM: ULTRASOUND ABDOMEN LIMITED RIGHT UPPER QUADRANT COMPARISON:  CT of the abdomen and pelvis on 05/30/2023 FINDINGS: Gallbladder: No gallstones or wall thickening visualized. No sonographic Murphy sign noted by sonographer. Common bile duct: Diameter: 2 mm Liver: The liver demonstrates coarse echotexture and increased echogenicity, likely reflecting diffuse steatosis. No overt cirrhotic contour abnormalities or focal lesions are identified. There is no evidence of intrahepatic biliary ductal dilatation. Portal vein is patent on color Doppler imaging with normal direction of blood flow towards the liver. Other: None. IMPRESSION: Increased echogenicity of the liver with coarse echotexture likely reflecting diffuse steatosis. No cholelithiasis or evidence of biliary obstruction. Electronically Signed   By: Irish Lack M.D.   On: 06/28/2023 10:03    Labs:  CBC: Recent Labs    05/29/23 0921 07/04/23 1537  WBC 5.8 5.7  HGB 15.9 14.6  HCT 48.2 44.4  PLT 163.0 145.0*    COAGS: No results for input(s): "INR", "APTT" in the last 8760 hours.  BMP: Recent Labs    05/29/23 0921 07/04/23 1537  NA 138 140  K 4.1 3.3*  CL 100 102  CO2 30 30  GLUCOSE 95 70  BUN 19 15  CALCIUM 9.8 9.4  CREATININE 1.01 0.96    LIVER FUNCTION TESTS: Recent Labs    05/29/23 0921 07/04/23 1537  BILITOT 0.8 0.6  AST 21  20  ALT 13 13  ALKPHOS 64 55  PROT 6.9 6.5  ALBUMIN 4.5 4.2    TUMOR MARKERS: No results for  input(s): "AFPTM", "CEA", "CA199", "CHROMGRNA" in the last 8760 hours.  Assessment and Plan:  Stable 10 mm saccular type aneurysm at a branch point of the peripheral right renal artery with rim calcification.  Again, aneurysm is stable dating back to 03/09/2009 (14 years).  No acute signs by CT including thrombus formation, peripheral emboli, renal infarcts, hemorrhage, or rupture.  At this size and stability, observation would be recommended.  Intervention for renal artery aneurysms is recommended at a size of significant interval growth or over 2 cm.  These findings and CT images were reviewed with the patient and his spouse.  All questions were addressed.  They seem reassured by our discussion today.  They are in agreement with continued observation.  Plan: Schedule for repeat CTA abdomen pelvis in 1 year.  If able, he has requested that his CT be scheduled at [facility] since he lives close by.  Thank you for this interesting consult.  I greatly enjoyed meeting CALEEL KINER and look forward to participating in their care.  A copy of this report was sent to the requesting provider on this date.  Electronically Signed: Berdine Dance 07/22/2023, 9:17 AM   I spent a total of  40 Minutes   in face to face in clinical consultation, greater than 50% of which was counseling/coordinating care for This patient with a 10 mm right renal artery aneurysm

## 2023-07-24 ENCOUNTER — Encounter: Payer: Self-pay | Admitting: Gastroenterology

## 2023-07-24 ENCOUNTER — Ambulatory Visit: Payer: PPO | Admitting: Gastroenterology

## 2023-07-24 VITALS — BP 110/62 | HR 66 | Wt 197.0 lb

## 2023-07-24 DIAGNOSIS — R109 Unspecified abdominal pain: Secondary | ICD-10-CM | POA: Diagnosis not present

## 2023-07-24 DIAGNOSIS — K319 Disease of stomach and duodenum, unspecified: Secondary | ICD-10-CM

## 2023-07-24 DIAGNOSIS — K5909 Other constipation: Secondary | ICD-10-CM

## 2023-07-24 MED ORDER — METOCLOPRAMIDE HCL 5 MG PO TABS: 5.0000 mg | ORAL_TABLET | Freq: Three times a day (TID) | ORAL | 0 refills | Status: DC

## 2023-07-24 NOTE — Patient Instructions (Addendum)
We have sent the following medications to your pharmacy for you to pick up at your convenience: Reglan 5 mg: Take three times daily before meals  Please go to the lab in the basement of our building to have lab work done as you leave today. Hit "B" for basement when you get on the elevator.  When the doors open the lab is on your left.  We will call you with the results. Thank you.   If your blood pressure at your visit was 140/90 or greater, please contact your primary care physician to follow up on this. ______________________________________________________  If you are age 70 or older, your body mass index should be between 23-30. Your Body mass index is 26.72 kg/m. If this is out of the aforementioned range listed, please consider follow up with your Primary Care Provider.  If you are age 70 or younger, your body mass index should be between 19-25. Your Body mass index is 26.72 kg/m. If this is out of the aformentioned range listed, please consider follow up with your Primary Care Provider.  ________________________________________________________  The [facility] providers would like to encourage you to use [facility] to communicate with providers for non-urgent requests or questions.  Due to long hold times on the telephone, sending your provider a message by [facility] may be a faster and more efficient way to get a response.  Please allow 48 business hours for a response.  Please remember that this is for non-urgent requests.  _______________________________________________________  Due to recent changes in healthcare laws, you may see the results of your imaging and laboratory studies on MyChart before your provider has had a chance to review them.  We understand that in some cases there may be results that are confusing or concerning to you. Not all laboratory results come back in the same time frame and the provider may be waiting for multiple results in order to interpret others.  Please give  [place] 48 hours in order for your provider to thoroughly review all the results before contacting the office for clarification of your results.

## 2023-07-24 NOTE — Progress Notes (Signed)
HPI :  72 year old male here for follow-up visit for abdominal pain.  Recall I saw him on October 9 for this issue which has led to an extensive evaluation as outlined below.  Reports now roughly 10 weeks ago he has had onset of abdominal discomfort that has progressed over time.  He mainly has mid to upper abdominal discomfort after eating that can radiate into his sides and he feels bloated.  This essentially occurs after every time he eats.  He will remain uncomfortable for a few hours before he gets better on its own.  Larger meals typically bother him more and so he has been eating less what he eats to minimize his symptoms.  He questions possible early satiety, does not really have nausea or vomiting routinely.  He does have some regurgitation after eating.  He has been on Pepcid and Protonix which have not really helped at all.  Give him a trial of IBgard which has not helped.  He localizes his symptoms mostly to the mid abdomen and upward.  So far he has had an EGD which did not show any clear cause for this.  He subsequently had a right upper quadrant ultrasound which showed no gallstones or concerning problems with his gallbladder.  He has had a CT scan on September 12 which showed no clear cause.  I discussed his case with radiology and performed a CTA on October 26th which showed no evidence of mesenteric ischemia, vasculature is patent.  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  He states it is pretty much there all the time and does not radiate down his leg.  This is definitely got worse lately over the past month.  He previously has seen sports medicine and had a different back surgery last year.  He is taking low-dose narcotic, a quarter of an oxycodone pill roughly 3 times daily.  This  has led to constipation but it is managed with Amitiza.  Having regular bowel movements at this time but has not made any difference in his abdominal pain.  His ultrasound showed fatty liver, he denies any alcohol use.  No weight loss.  LFTs been normal.  Incidentally his EGD showed a questionable subepithelial gastric lesion that was quite small.  Superficial biopsies negative.  Essentially since we started this workup and over the past month he is not feeling any improved and remains about the same.  We discussed options.  Prior workup: Colonoscopy 09/05/22: - The perianal and digital rectal examinations were normal. - A diminutive polyp was found in the cecum. The polyp was sessile. The polyp was removed with a cold snare. Resection and retrieval were complete. - A 3 mm polyp was found in the transverse colon. The polyp was sessile. The polyp was removed with a cold snare. Resection and retrieval were complete. - Many medium-mouthed diverticula were found in the left colon. - A large amount of semi-liquid stool was found in the entire colon, making visualization difficult. Lavage of the colon was performed using copious amounts of sterile water, resulting in clearance with adequate visualization. This process took several minutes and prolonged the exam. - Internal hemorrhoids were found during retroflexion. - The exam was otherwise without abnormality.   1. Surgical [P], colon, cecum,  polyp (1) - POLYPOID FRAGMENT OF BENIGN COLONIC MUCOSA WITH LYMPHOID AGGREGATE 2. Surgical [P], colon, transverse, polyp (1) - TUBULAR ADENOMA. - NO HIGH GRADE DYSPLASIA OR MALIGNANCY.   Repeat in 7 years       CT abdomen  pelvis 05/30/23: IMPRESSION: 1. Prominent stool throughout the colon favors constipation. 2. 1.1 by 0.8 cm saccular aneurysm at a branch point in the right renal artery with mild rim calcification. If the patient has uncontrolled hypertension, referral to interventional radiology  for consultation is recommended. In the absence of uncontrolled hypertension, imaging surveillance by CT is recommended in 2 years time. 3. Nonobstructive left nephrolithiasis. 4. Prostatomegaly. 5. Compression fracture at T12 with near complete central loss of vertebral height, and prior vertebral augmentation. The degree of compression is mildly increased from 01/23/2023, and the vertebral augmentation is new compared to that exam. 6. Aortic atherosclerosis.    EGD 06/27/23: - Esophagogastric landmarks were identified: the Z-line was found at 40 cm, the gastroesophageal junction was found at 40 cm and the upper extent of the gastric folds was found at 42 cm from the incisors. Findings: - A 2 cm hiatal hernia was present. - The exam of the esophagus was otherwise normal. - A single 6 to 7 mm subepithelial papule was found in the gastric antrum. Bite on bite biopsies were taken with a cold forceps for histology. - The exam of the stomach was otherwise normal. - Biopsies were taken with a cold forceps for Helicobacter pylori testing. - The examined duodenum was normal.  FINAL DIAGNOSIS        1. Surgical [P], gastric antrum and gastric body :       -  ANTRAL MUCOSA WITH CHEMICAL/REACTIVE CHANGE.       -  OXYNTIC MUCOSA WITH NO SIGNIFICANT PATHOLOGY.       -  NO HELICOBACTER PYLORI ORGANISMS IDENTIFIED ON H&E STAINED SLIDE.        2. Surgical [P], gastric nodule stomach :       -  ANTRAL-TYPE MUCOSA WITH MILD FOVEOLAR HYPERPLASIA AND CHEMICAL/REACTIVE       CHANGE.       -  NO HELICOBACTER PYLORI ORGANISMS IDENTIFIED ON H&E STAINED SLIDE.     RUQ [place] 06/28/23: IMPRESSION: Increased echogenicity of the liver with coarse echotexture likely reflecting diffuse steatosis. No cholelithiasis or evidence of biliary obstruction.    CT angiography abdomen 07/04/23: IMPRESSION: VASCULAR   1. No significant stenosis or occlusion of any of the 3 visceral arteries to suggest a source for  chronic mesenteric ischemia. 2.  Aortic Atherosclerosis (ICD10-I70.0).   NON-VASCULAR   1. No acute or clinically significant abnormality within the abdomen or pelvis to explain the patient's clinical symptoms. 2. Colonic diverticular disease without CT evidence of active inflammation. 3. Moderate colonic stool burden suggests constipation. 4. Prior T12 compression fracture status post kyphoplasty. 5. Lower lumbar degenerative disc disease and facet arthropathy. 6. Additional ancillary findings as above.    Past Medical History:  Diagnosis Date   Arm fracture    left. Dx'd w/RSD post fracture   Arthritis    Carpal tunnel syndrome    Chest pain 2016   [facility] (Dr. Marcelle Overlie) doing stress echo, but feels like musculoskeletal chest wall pain is cause of his discomfort   Chronic back pain    COVID-19 06/2020   Deviated septum    Diverticulosis    GERD (gastroesophageal reflux disease)    Hyperlipidemia    Hypertension  Joint pain    Muscle pain    OSA on CPAP 07/04/2018   - Trial of CPAP therapy on 17 cm H2O with a Large size Fisher&Paykel Full Face Mask Simplus mask and heated humidification.   Plantar fasciitis    Pulmonary nodule 04/2016   [place].  Pt chose to repeat CT chest w/out contrast in 3 mo---as per radiologist's recommendations.   Right inguinal hernia    fat-containing.  Small hydrocele in left hemiscrotum.   Sleep apnea    Snoring    Swelling    of legs/feet/hands   Tobacco dependence in remission      Past Surgical History:  Procedure Laterality Date   COLONOSCOPY     fracture repair left arm     had a fixator   INGUINAL HERNIA REPAIR Left 2013   repeat hernia repair. x2   IR RADIOLOGIST EVAL & MGMT  07/22/2023   KNEE ARTHROSCOPY Right    LUMBAR DISC SURGERY  1993   NEURECTOMY inguinal after hernia repair Left    PLANTAR FASCIA SURGERY Right 2000s   SHOULDER ARTHROSCOPY Right    UPPER GI ENDOSCOPY     Family History  Problem  Relation Age of Onset   Lung cancer Mother    Lung cancer Father    Bone cancer Brother        bone   Heart disease Maternal Uncle    Colon cancer Neg Hx    Esophageal cancer Neg Hx    Stomach cancer Neg Hx    Social History   Tobacco Use   Smoking status: Former    Current packs/day: 0.00    Average packs/day: 3.0 packs/day for 30.0 years (90.0 ttl pk-yrs)    Types: Cigarettes    Start date: 07/16/1972    Quit date: 07/16/2002    Years since quitting: 21.0   Smokeless tobacco: Never  Vaping Use   Vaping status: Never Used  Substance Use Topics   Alcohol use: Yes    Alcohol/week: 1.0 - 2.0 standard drink of alcohol    Types: 1 - 2 Cans of beer per week    Comment: socially   Drug use: No   Current Outpatient Medications  Medication Sig Dispense Refill   amLODipine (NORVASC) 10 MG tablet Take 1 tablet (10 mg total) by mouth daily. 90 tablet 1   aspirin 81 MG tablet Take 81 mg by mouth daily.     baclofen (LIORESAL) 20 MG tablet Take 0.5-1 tablets (10-20 mg total) by mouth 3 (three) times daily. 270 tablet 1   escitalopram (LEXAPRO) 20 MG tablet Take 1 tablet (20 mg total) by mouth daily. 90 tablet 1   famotidine (PEPCID) 20 MG tablet Take 1 tablet (20 mg total) by mouth 2 (two) times daily. 60 tablet 2   gabapentin (NEURONTIN) 600 MG tablet Take 1 tablet (600 mg total) by mouth 3 (three) times daily. 1 tab morning, 1.5 tabs afternoon, 1 tab evening,  doses should be every 8 hours. 270 tablet 1   hydrochlorothiazide (HYDRODIURIL) 25 MG tablet Take 1 tablet (25 mg total) by mouth daily. 90 tablet 1   lisinopril (ZESTRIL) 10 MG tablet Take 1 tablet (10 mg total) by mouth daily. 90 tablet 1   lubiprostone (AMITIZA) 24 MCG capsule Take 1 capsule (24 mcg total) by mouth 2 (two) times daily with a meal. 60 capsule 5   MegaRed Omega-3 Krill Oil 500 MG CAPS Take 2 capsules by mouth daily.  MYRBETRIQ 50 MG TB24 tablet Take 50 mg by mouth daily.     [START ON 08/17/2023]  oxyCODONE-acetaminophen (PERCOCET) 10-325 MG tablet Take 1 tablet by mouth every 8 (eight) hours as needed for pain. 90 tablet 0   sennosides-docusate sodium (SENOKOT-S) 8.6-50 MG tablet Take 2 tablets by mouth at bedtime as needed for constipation. 180 tablet 1   simvastatin (ZOCOR) 10 MG tablet Take 1 tablet (10 mg total) by mouth every other day. 45 tablet 1   sucralfate (CARAFATE) 1 g tablet Take 1 tablet (1 g total) by mouth 4 (four) times daily -  with meals and at bedtime. 120 tablet 0   No current facility-administered medications for this visit.   Allergies  Allergen Reactions   Penicillins Hives   Celebrex [Celecoxib]    Cymbalta [Duloxetine Hcl]    Lyrica [Pregabalin]      Review of Systems: All systems reviewed and negative except where noted in HPI.    IR Radiologist Eval & Mgmt  Result Date: 07/22/2023 : Please refer to the dictated note in [facility] for this IR eval and management. Electronically Signed   By: Judie Petit.  Shick M.D.   On: 07/22/2023 09:33   CT Angio Abd/Pel w/ and/or w/o  Result Date: 07/13/2023 CLINICAL DATA:  Abdominal pain.  Evaluate for mesenteric ischemia. EXAM: CTA ABDOMEN AND PELVIS WITHOUT AND WITH CONTRAST TECHNIQUE: Multidetector CT imaging of the abdomen and pelvis was performed using the standard protocol during bolus administration of intravenous contrast. Multiplanar reconstructed images and MIPs were obtained and reviewed to evaluate the vascular anatomy. RADIATION DOSE REDUCTION: This exam was performed according to the departmental dose-optimization program which includes automated exposure control, adjustment of the mA and/or kV according to patient size and/or use of iterative reconstruction technique. CONTRAST:  OMNIPAQUE IOHEXOL 350 MG/ML SOLN COMPARISON:  CT scan of the abdomen and pelvis dated 05/30/2023 FINDINGS: VASCULAR Aorta: Normal caliber aorta without aneurysm, dissection, vasculitis or significant stenosis. Scattered  atherosclerotic vascular calcifications. Celiac: Patent without evidence of aneurysm, dissection, vasculitis or significant stenosis. SMA: Patent without evidence of aneurysm, dissection, vasculitis or significant stenosis. Renals: Both renal arteries are patent without evidence of aneurysm, dissection, vasculitis, fibromuscular dysplasia or significant stenosis. IMA: Patent without evidence of aneurysm, dissection, vasculitis or significant stenosis. Inflow: Patent without evidence of aneurysm, dissection, vasculitis or significant stenosis. Proximal Outflow: Bilateral common femoral and visualized portions of the superficial and profunda femoral arteries are patent without evidence of aneurysm, dissection, vasculitis or significant stenosis. Veins: No obvious venous abnormality within the limitations of this arterial phase study. Review of the MIP images confirms the above findings. NON-VASCULAR Lower chest: No acute abnormality. Hepatobiliary: Normal hepatic contour and morphology. Small calcifications present in hepatic segment 4 a likely reflect sequelae of old granulomatous disease. Gallbladder is unremarkable. No intra or extrahepatic biliary ductal dilatation. Pancreas: Unremarkable. No pancreatic ductal dilatation or surrounding inflammatory changes. Spleen: Normal in size. Punctate calcifications consistent with old granulomatous disease. Adrenals/Urinary Tract: Normal adrenal glands. No hydronephrosis, or enhancing renal mass. 3 mm nonobstructing stone in the lower pole collecting system of the left kidney. The ureters and bladder are unremarkable. Stomach/Bowel: Colonic diverticular disease without CT evidence of active inflammation. No focal bowel wall thickening or evidence of obstruction. Lymphatic: No suspicious lymphadenopathy. Reproductive: Prostatomegaly. Other: No abdominal wall hernia or abnormality. No abdominopelvic ascites. Musculoskeletal: T12 compression fracture status post kyphoplasty.  Lower lumbar degenerative disc disease and facet arthropathy. IMPRESSION: VASCULAR 1. No significant stenosis or occlusion  of any of the 3 visceral arteries to suggest a source for chronic mesenteric ischemia. 2.  Aortic Atherosclerosis (ICD10-I70.0). NON-VASCULAR 1. No acute or clinically significant abnormality within the abdomen or pelvis to explain the patient's clinical symptoms. 2. Colonic diverticular disease without CT evidence of active inflammation. 3. Moderate colonic stool burden suggests constipation. 4. Prior T12 compression fracture status post kyphoplasty. 5. Lower lumbar degenerative disc disease and facet arthropathy. 6. Additional ancillary findings as above. Electronically Signed   By: Malachy Moan M.D.   On: 07/13/2023 08:21   US Abdomen Limited RUQ (LIVER/GB)  Result Date: 06/28/2023 CLINICAL DATA:  Progressive abdominal pain. EXAM: ULTRASOUND ABDOMEN LIMITED RIGHT UPPER QUADRANT COMPARISON:  CT of the abdomen and pelvis on 05/30/2023 FINDINGS: Gallbladder: No gallstones or wall thickening visualized. No sonographic Murphy sign noted by sonographer. Common bile duct: Diameter: 2 mm Liver: The liver demonstrates coarse echotexture and increased echogenicity, likely reflecting diffuse steatosis. No overt cirrhotic contour abnormalities or focal lesions are identified. There is no evidence of intrahepatic biliary ductal dilatation. Portal vein is patent on color Doppler imaging with normal direction of blood flow towards the liver. Other: None. IMPRESSION: Increased echogenicity of the liver with coarse echotexture likely reflecting diffuse steatosis. No cholelithiasis or evidence of biliary obstruction. Electronically Signed   By: Irish Lack M.D.   On: 06/28/2023 10:03    Lab Results  Component Value Date   WBC 5.7 07/04/2023   HGB 14.6 07/04/2023   HCT 44.4 07/04/2023   MCV 91.5 07/04/2023   PLT 145.0 (L) 07/04/2023    Lab Results  Component Value Date   NA 140  07/04/2023   CL 102 07/04/2023   K 3.3 (L) 07/04/2023   CO2 30 07/04/2023   BUN 15 07/04/2023   CREATININE 0.96 07/04/2023   GFR 80.39 07/04/2023   CALCIUM 9.4 07/04/2023   PHOS 2.1 (L) 07/29/2013   ALBUMIN 4.2 07/04/2023   GLUCOSE 70 07/04/2023    Lab Results  Component Value Date   ALT 13 07/04/2023   AST 20 07/04/2023   ALKPHOS 55 07/04/2023   BILITOT 0.6 07/04/2023   Lab Results  Component Value Date   LIPASE 18.0 07/04/2023     Physical Exam: BP 110/62   Pulse 66   Wt 197 lb (89.4 kg)   BMI 26.72 kg/m  Constitutional: Pleasant,well-developed, male in no acute distress. Abdominal: Soft, nondistended, nontender.  There are no masses palpable. No hepatomegaly. Right lower back TTP without rash  Extremities: no edema Neurological: Alert and oriented to person place and time. Psychiatric: Normal mood and affect. Behavior is normal.   ASSESSMENT: 72 y.o. male here for assessment of the following  1. Abdominal pain, unspecified abdominal location   2. Chronic constipation   3. Subepithelial gastric lesion    History as above-significant postprandial mid abdominal pain that continues to bother him.  He is also developed some right lower back pain over the past month with this as well.  He has had an extensive evaluation to include 2 CT scans of the abdomen, 1 of which a CTA, EGD, right upper quadrant ultrasound, all without clear pathology.  His weight is stable.  Most concerning etiology would be mesenteric ischemia or malignancy, we see no evidence of that on his imaging.  We discussed DDx.  His symptoms seem lower than would expect for gastroparesis but he does have some regurgitation, clear prandial association and some distention, I offered him some empiric Reglan to see if that  will help with his meals and symptoms.  I discussed risks of Reglan, at low-dose, very safe, will try 5 mg 3 times daily to start.  Again I am not convinced he has gastroparesis causing  this.  His symptoms seem lower than would expect for biliary colic and distribution of pain would be atypical for that as well.  We can consider HIDA scan pending his course but again I think less likely.  I will check fecal pancreatic elastase although his bowel habits are not too consistent with that of pancreatic insufficiency.  Will also check lab to check baseline cortisol level to assess for adrenal insufficiency.  Again I think that is less likely.  Depending on how much this bothers him, which appears significant, if his symptoms persist despite the workup we have done in the options of medicines we have tried for him, may consider general surgery evaluation for possible exploratory laparotomy.  He will consider this if symptoms persist.  Otherwise he clearly has a new right lower back pain that is quite bothersome to him.  His abdominal symptoms do not seem musculoskeletal given postprandial nature of this but recommend he follow-up with a sports medicine physician for the right lower back pain.  He is already established with that clinic, Dr. Clayburn Pert.  Otherwise over time we may consider a follow-up EUS of suspected subepithelial gastric lesion although this is very small would not be expected be causing any symptoms right now.  Amitiza is working well to control his bowels, he should continue that.  He is on a very low-dose of narcotic and I doubt narcotic bowel based on description of symptoms otherwise   PLAN: - trial of empiric Reglan 5mg  TID prior to meals - few weeks trial, although not convinced he has gastroparesis - stool for pancreatic fecal elastase - lab for fasting AM cortisol  - consider referral to general surgery if this persists, consideration for ex-lap? - see sports med physician for low back pain although abdominal pain does not seem to be musculoskeletal - consideration for EUS of gastric lesion but that is not pressing right now in light of his other symptoms - continue  Amitiza  Harlin Rain, MD [facility]

## 2023-07-25 ENCOUNTER — Encounter: Payer: Self-pay | Admitting: Family Medicine

## 2023-07-25 ENCOUNTER — Ambulatory Visit: Payer: PPO | Admitting: Family Medicine

## 2023-07-25 ENCOUNTER — Ambulatory Visit (INDEPENDENT_AMBULATORY_CARE_PROVIDER_SITE_OTHER): Payer: PPO

## 2023-07-25 ENCOUNTER — Other Ambulatory Visit: Payer: Self-pay

## 2023-07-25 VITALS — BP 120/72 | HR 83 | Ht 72.0 in | Wt 195.0 lb

## 2023-07-25 DIAGNOSIS — M25511 Pain in right shoulder: Secondary | ICD-10-CM

## 2023-07-25 DIAGNOSIS — R0781 Pleurodynia: Secondary | ICD-10-CM

## 2023-07-25 DIAGNOSIS — M542 Cervicalgia: Secondary | ICD-10-CM

## 2023-07-25 DIAGNOSIS — G8929 Other chronic pain: Secondary | ICD-10-CM | POA: Diagnosis not present

## 2023-07-25 DIAGNOSIS — R918 Other nonspecific abnormal finding of lung field: Secondary | ICD-10-CM | POA: Diagnosis not present

## 2023-07-25 NOTE — Progress Notes (Signed)
I, Nicholas Graham, CMA acting as a scribe for Nicholas Graham, MD.  Nicholas Graham is a 72 y.o. male who presents to [facility] at [facility] today for exacerbation of his R hip pain. Pt was last seen by Dr. [name] on 06/12/23 and was given a R lateral hip steroid injection and was taught HEP.  Today, pt reports right-sided thoracic back/rib pain. Sx flare after walking > 2 miles, arm ROM. Pt is RHD. Mechanical sx present. No known injury. Sx start in the morning and lingers throughout the day. Has tried topical analgesic with minimal relief. Mild pain with deep breathing. Notes difficulty sleeping on the right side. Some pain in the right shoulder. Denies bowel/bladder dysfunction. Also notes some right-sided neck pain.   Pertinent review of systems: No fevers or chills  Relevant historical information: Hypertension.   Exam:  BP 120/72   Pulse 83   Ht 6' (1.829 m)   Wt 195 lb (88.5 kg)   SpO2 97%   BMI 26.45 kg/m  General: Well Developed, well nourished, and in no acute distress.   MSK: C-spine: Normal appearing Nontender palpation cervical midline.  Tender palpation right trapezius. T-spine nontender palpation midline.  Tender palpation right lateral inferior rib area. Right shoulder: Normal-appearing Decreased range of motion Limited abduction external and internal rotation.  Pain with abduction.  Positive Hawkins and Neer's test.  Positive empty can test. Shoulder motion does tend to reproduce the lateral rib pain on the right.    Lab and Radiology Results  Procedure: Real-time Ultrasound Guided Injection of right shoulder subacromial bursa Device: Philips Affiniti 50G/GE Logiq Images permanently stored and available for review in PACS Verbal informed consent obtained.  Discussed risks and benefits of procedure. Warned about infection, bleeding, hyperglycemia damage to structures among others. Patient expresses understanding and agreement Time-out  conducted.   Noted no overlying erythema, induration, or other signs of local infection.   Skin prepped in a sterile fashion.   Local anesthesia: Topical Ethyl chloride.   With sterile technique and under real time ultrasound guidance: 40 mg of Kenalog and 2 mL's of Marcaine injected into subacromial bursa. Fluid seen entering the bursa.   Completed without difficulty   Pain moderately immediately resolved suggesting accurate placement of the medication.   Advised to call if fevers/chills, erythema, induration, drainage, or persistent bleeding.   Images permanently stored and available for review in the ultrasound unit.  Impression: Technically successful ultrasound guided injection.   X-ray images right shoulder and right ribs obtained today personally and independently interpreted.  Right shoulder: Normal glenohumeral DJD.  No acute fractures are visible.  Right ribs: Acute rib fractures.  No infiltrate present in the chest film the right.  Await formal radiology review    Assessment and Plan: 72 y.o. male with right-sided thoracic cervical and shoulder pain.  Fundamentally I think he has shoulder impingement and bursitis.  That was treated with a steroid injection today.  He has pain located in the trapezius and along the right lateral inferior ribs.  I believe this is due to scapular stabilizer muscle dysfunction.  Ideally we would use physical therapy here but he is expressed reluctance to attend physical therapy in the past.  That is potentially an option for the future if needed.  Recommend now lidocaine patches and heat.  He does have a vertebral compression fracture history.  Could evaluate the thoracic spine more accurately in the future if needed. His pain today possibly could be  thoracic radiculopathy.  This is less likely but could consider T-spine MRI in the future.  Most recent 1 was June of this year.  PDMP not reviewed this encounter. Orders Placed This Encounter   Procedures   [place] LIMITED JOINT SPACE STRUCTURES UP RIGHT(NO LINKED CHARGES)    Order Specific Question:   Reason for Exam (SYMPTOM  OR DIAGNOSIS REQUIRED)    Answer:   right shoulder pain    Order Specific Question:   Preferred imaging location?    Answer:   [facility] Ribs Unilateral W/Chest Right    Standing Status:   Future    Number of Occurrences:   1    Standing Expiration Date:   08/24/2023    Order Specific Question:   Reason for Exam (SYMPTOM  OR DIAGNOSIS REQUIRED)    Answer:   rib pain    Order Specific Question:   Preferred imaging location?    Answer:   [facility]   DG Shoulder Right    Standing Status:   Future    Number of Occurrences:   1    Standing Expiration Date:   07/24/2024    Order Specific Question:   Reason for Exam (SYMPTOM  OR DIAGNOSIS REQUIRED)    Answer:   right shoulder pain    Order Specific Question:   Preferred imaging location?    Answer:   [facility]   No orders of the defined types were placed in this encounter.    Discussed warning signs or symptoms. Please see discharge instructions. Patient expresses understanding.   The above documentation has been reviewed and is accurate and complete Nicholas Graham, M.D.

## 2023-07-25 NOTE — Patient Instructions (Addendum)
Thank you for coming in today.   You received an injection today. Seek immediate medical attention if the joint becomes red, extremely painful, or is oozing fluid.   Please get an Xray today before you leave   Check back as needed 

## 2023-07-30 ENCOUNTER — Other Ambulatory Visit (INDEPENDENT_AMBULATORY_CARE_PROVIDER_SITE_OTHER): Payer: PPO

## 2023-07-30 DIAGNOSIS — K319 Disease of stomach and duodenum, unspecified: Secondary | ICD-10-CM | POA: Diagnosis not present

## 2023-07-30 DIAGNOSIS — K5909 Other constipation: Secondary | ICD-10-CM

## 2023-07-30 DIAGNOSIS — R109 Unspecified abdominal pain: Secondary | ICD-10-CM

## 2023-07-30 LAB — CORTISOL: Cortisol, Plasma: 9.2 ug/dL

## 2023-07-31 ENCOUNTER — Other Ambulatory Visit: Payer: Self-pay | Admitting: Gastroenterology

## 2023-07-31 DIAGNOSIS — R109 Unspecified abdominal pain: Secondary | ICD-10-CM

## 2023-07-31 DIAGNOSIS — K5909 Other constipation: Secondary | ICD-10-CM

## 2023-08-14 NOTE — Progress Notes (Signed)
Right shoulder x-ray looks normal to radiology

## 2023-08-14 NOTE — Progress Notes (Signed)
Right rib x-ray shows atelectasis which is typical after a bruise or broken rib.  How are you feeling now?  No broken ribs are visible

## 2023-08-17 ENCOUNTER — Other Ambulatory Visit: Payer: Self-pay | Admitting: Family Medicine

## 2023-08-20 ENCOUNTER — Encounter: Payer: Self-pay | Admitting: Family Medicine

## 2023-08-20 ENCOUNTER — Ambulatory Visit: Payer: PPO | Admitting: Family Medicine

## 2023-08-20 ENCOUNTER — Other Ambulatory Visit: Payer: Self-pay

## 2023-08-20 VITALS — BP 110/82 | HR 86 | Ht 72.0 in | Wt 192.0 lb

## 2023-08-20 DIAGNOSIS — M542 Cervicalgia: Secondary | ICD-10-CM

## 2023-08-20 DIAGNOSIS — R0781 Pleurodynia: Secondary | ICD-10-CM | POA: Diagnosis not present

## 2023-08-20 DIAGNOSIS — D692 Other nonthrombocytopenic purpura: Secondary | ICD-10-CM | POA: Diagnosis not present

## 2023-08-20 DIAGNOSIS — M25511 Pain in right shoulder: Secondary | ICD-10-CM | POA: Diagnosis not present

## 2023-08-20 DIAGNOSIS — G8929 Other chronic pain: Secondary | ICD-10-CM

## 2023-08-20 DIAGNOSIS — Z85828 Personal history of other malignant neoplasm of skin: Secondary | ICD-10-CM | POA: Diagnosis not present

## 2023-08-20 DIAGNOSIS — Z129 Encounter for screening for malignant neoplasm, site unspecified: Secondary | ICD-10-CM | POA: Diagnosis not present

## 2023-08-20 NOTE — Patient Instructions (Addendum)
Thank you for coming in today. Thank you for coming in today.   I've ordered a Chest CT.  Let us know if you don't hear from them in one week about scheduling.  Please call [facility] ([facility]) at [phone] to schedule your spine injection.

## 2023-08-20 NOTE — Progress Notes (Signed)
I, [name], CMA acting as a scribe for Nicholas Graham, MD.  Nicholas Graham is a 72 y.o. male who presents to [facility] at [facility] today for f/u neck, R shoulder, and rib pain. Pt was last seen by Dr. [name] on 07/25/23 and was given a R subacromial steroid injection and was prescribed lidocaine patches and advised to use heat.   Today, pt reports continued pain in the right shoulder and right side ribs. Locates pain distally to right pectoralis. Review XR and treatment options today.   Dx imaging: 07/25/23 R shoulder & R rib/chest XR  05/01/22 R shoulder MRI  03/13/22 C-spine MRI  10/26/21 R shoulder XR  Pertinent review of systems: No fevers or chills  Relevant historical information: History of right shoulder pain following surgery and right cervical radiculopathy.   Exam:  BP 110/82   Pulse 86   Ht 6' (1.829 m)   Wt 192 lb (87.1 kg)   SpO2 99%   BMI 26.04 kg/m  General: Well Developed, well nourished, and in no acute distress.   MSK: Right shoulder: Mature scar superior shoulder.  Tender palpation anterior shoulder.  Intact motion.  Right ribs tender palpation anterior inferior ribs right side.    Lab and Radiology Results  EXAM: RIGHT SHOULDER - 3 VIEW   COMPARISON:  None Available.   FINDINGS: There is no evidence of fracture or dislocation. There is no evidence of arthropathy or other focal bone abnormality. Soft tissues are unremarkable.   IMPRESSION: Negative.     Electronically Signed   By: Layla Maw M.D.   On: 08/13/2023 19:36  EXAM: RIGHT RIBS AND CHEST - 3 VIEW   COMPARISON:  None Available.   FINDINGS: No fracture or other bone lesions are seen involving the ribs. There is no evidence of pneumothorax or pleural effusion. Calcification consistent with granuloma left upper lobe. Linear subsegmental atelectasis, scarring or minimal consolidation left upper hemithorax. Heart size and mediastinal contours are  within normal limits.   IMPRESSION: 1. No rib fractures identified. 2. Linear subsegmental atelectasis, scarring or minimal consolidation left upper hemithorax.     Electronically Signed   By: Layla Maw M.D.   On: 08/13/2023 19:34 I, Nicholas Graham, personally (independently) visualized and performed the interpretation of the images attached in this note.    Assessment and Plan: 72 y.o. male with right shoulder and right inferior rib pain.  Etiology is unclear for both.  He does have a MRI of the shoulder and cervical spine from June and August 2023.  Plan for trial of a cervical epidural steroid injection.  I believe a lot of his pain could be related to cervical nerve root impingement.  Additionally proceed with a CT scan of the chest to evaluate the rib pain more accurately.  Next step could be repeat MRI cervical spine and shoulder if needed.  Additionally discussing with his gastroenterologist further testing that could be done to evaluate for abdominal pain such as a HIDA scan with ejection fraction.  However he has had a fair amount of testing and this test may not be necessary.   PDMP not reviewed this encounter. Orders Placed This Encounter  Procedures   [place] LIMITED JOINT SPACE STRUCTURES UP RIGHT(NO LINKED CHARGES)    Order Specific Question:   Reason for Exam (SYMPTOM  OR DIAGNOSIS REQUIRED)    Answer:   right shoulder pain    Order Specific Question:  Preferred imaging location?    Answer:   [facility]   CT CHEST WO CONTRAST    Standing Status:   Future    Standing Expiration Date:   08/19/2024    Order Specific Question:   Preferred imaging location?    Answer:   [facility]INC NEEDLE/CATH/PLC EPI/CERV/THOR W/IMG    Standing Status:   Future    Standing Expiration Date:   09/20/2023    Order Specific Question:   Reason for Exam (SYMPTOM  OR DIAGNOSIS REQUIRED)    Answer:   cervical radiculopathy    Order  Specific Question:   Preferred Imaging Location?    Answer:   [facility]   No orders of the defined types were placed in this encounter.    Discussed warning signs or symptoms. Please see discharge instructions. Patient expresses understanding.   The above documentation has been reviewed and is accurate and complete Nicholas Graham, M.D.

## 2023-08-22 ENCOUNTER — Ambulatory Visit: Payer: PPO | Admitting: Family Medicine

## 2023-08-26 ENCOUNTER — Ambulatory Visit: Payer: PPO | Admitting: Internal Medicine

## 2023-08-28 ENCOUNTER — Telehealth: Payer: Self-pay

## 2023-08-28 DIAGNOSIS — R109 Unspecified abdominal pain: Secondary | ICD-10-CM

## 2023-08-28 NOTE — Telephone Encounter (Signed)
-----   Message from Benancio Deeds sent at 08/28/2023  8:12 AM EST ----- Regarding: RE: HIDA scan with ejection fraction? Either way okay, we can order it for him.  Jan can you help order this patient a HIDA scan for abdominal pain? Thanks ----- Message ----- From: Rodolph Bong, MD Sent: 08/28/2023   7:09 AM EST To: Benancio Deeds, MD Subject: RE: HIDA scan with ejection fraction?          Thank you for your reply. Are you ok if I order it or will you? Clayburn Pert ----- Message ----- From: Benancio Deeds, MD Sent: 08/20/2023   7:37 PM EST To: Rodolph Bong, MD Subject: RE: HIDA scan with ejection fraction?          Hey. Thanks for the note. Really tough case. Sure we can do the HIDA, I thought his symptoms less likely biliary colic but if it is abnormal I'd take out his gallbladder given this has persisted. I also offered him a referral to surgery to discuss this and even an ex-lap if his symptoms persist to this extent without findings otherwise. I think HIDA is certainly reasonable at this point. Thanks  Brett Canales ----- Message ----- From: Rodolph Bong, MD Sent: 08/20/2023  11:10 AM EST To: Benancio Deeds, MD Subject: HIDA scan with ejection fraction?              I am seeing Nicholas Graham for his shoulder and rib pain.  He asked if there is any other test that could be done for his lower chest or upper abdominal pain.  I am going to do a CT scan of his chest.  Do you think he would benefit from a HIDA scan with ejection fraction?  Thanks, Clayburn Pert

## 2023-08-28 NOTE — Discharge Instructions (Signed)

## 2023-08-28 NOTE — Telephone Encounter (Signed)
Order entered for HIDA scan.  Msg to schedulers to call patient to schedule him for the procedure

## 2023-08-29 ENCOUNTER — Other Ambulatory Visit: Payer: PPO

## 2023-08-29 ENCOUNTER — Ambulatory Visit
Admission: RE | Admit: 2023-08-29 | Discharge: 2023-08-29 | Disposition: A | Discharge status: Home / Self Care | Payer: PPO | Source: Ambulatory Visit | Attending: Family Medicine | Admitting: Family Medicine

## 2023-08-29 DIAGNOSIS — R918 Other nonspecific abnormal finding of lung field: Secondary | ICD-10-CM | POA: Diagnosis not present

## 2023-08-29 DIAGNOSIS — M542 Cervicalgia: Secondary | ICD-10-CM

## 2023-08-29 DIAGNOSIS — M4722 Other spondylosis with radiculopathy, cervical region: Secondary | ICD-10-CM | POA: Diagnosis not present

## 2023-08-29 DIAGNOSIS — R0781 Pleurodynia: Secondary | ICD-10-CM

## 2023-08-29 MED ORDER — TRIAMCINOLONE ACETONIDE 40 MG/ML IJ SUSP (RADIOLOGY)
60.0000 mg | Freq: Once | INTRAMUSCULAR | Status: AC
  Administered 2023-08-29: 60 mg via EPIDURAL

## 2023-08-29 MED ORDER — IOPAMIDOL (ISOVUE-M 300) INJECTION 61%
1.0000 mL | Freq: Once | INTRAMUSCULAR | Status: AC | PRN
  Administered 2023-08-29: 1 mL via EPIDURAL

## 2023-09-05 ENCOUNTER — Ambulatory Visit (INDEPENDENT_AMBULATORY_CARE_PROVIDER_SITE_OTHER): Payer: PPO | Admitting: Family Medicine

## 2023-09-05 ENCOUNTER — Encounter: Payer: Self-pay | Admitting: Family Medicine

## 2023-09-05 VITALS — BP 130/78 | HR 92 | Temp 98.5°F | Wt 190.2 lb

## 2023-09-05 DIAGNOSIS — G4733 Obstructive sleep apnea (adult) (pediatric): Secondary | ICD-10-CM

## 2023-09-05 DIAGNOSIS — M546 Pain in thoracic spine: Secondary | ICD-10-CM | POA: Diagnosis not present

## 2023-09-05 DIAGNOSIS — M51362 Other intervertebral disc degeneration, lumbar region with discogenic back pain and lower extremity pain: Secondary | ICD-10-CM | POA: Diagnosis not present

## 2023-09-05 DIAGNOSIS — G8929 Other chronic pain: Secondary | ICD-10-CM

## 2023-09-05 DIAGNOSIS — E785 Hyperlipidemia, unspecified: Secondary | ICD-10-CM | POA: Diagnosis not present

## 2023-09-05 DIAGNOSIS — F419 Anxiety disorder, unspecified: Secondary | ICD-10-CM

## 2023-09-05 DIAGNOSIS — I10 Essential (primary) hypertension: Secondary | ICD-10-CM | POA: Diagnosis not present

## 2023-09-05 DIAGNOSIS — M5134 Other intervertebral disc degeneration, thoracic region: Secondary | ICD-10-CM | POA: Diagnosis not present

## 2023-09-05 DIAGNOSIS — K2100 Gastro-esophageal reflux disease with esophagitis, without bleeding: Secondary | ICD-10-CM

## 2023-09-05 DIAGNOSIS — Z0289 Encounter for other administrative examinations: Secondary | ICD-10-CM

## 2023-09-05 DIAGNOSIS — M5090 Cervical disc disorder, unspecified, unspecified cervical region: Secondary | ICD-10-CM | POA: Diagnosis not present

## 2023-09-05 DIAGNOSIS — I700 Atherosclerosis of aorta: Secondary | ICD-10-CM | POA: Diagnosis not present

## 2023-09-05 DIAGNOSIS — Z9889 Other specified postprocedural states: Secondary | ICD-10-CM | POA: Diagnosis not present

## 2023-09-05 DIAGNOSIS — M4802 Spinal stenosis, cervical region: Secondary | ICD-10-CM

## 2023-09-05 MED ORDER — LUBIPROSTONE 24 MCG PO CAPS: 24.0000 ug | ORAL_CAPSULE | Freq: Two times a day (BID) | ORAL | 5 refills | Status: DC

## 2023-09-05 MED ORDER — HYDROCHLOROTHIAZIDE 25 MG PO TABS
25.0000 mg | ORAL_TABLET | Freq: Every day | ORAL | 1 refills | Status: DC
Start: 2023-09-05 — End: 2023-11-21

## 2023-09-05 MED ORDER — LISINOPRIL 10 MG PO TABS: 10.0000 mg | ORAL_TABLET | Freq: Every day | ORAL | 1 refills | Status: DC

## 2023-09-05 MED ORDER — BACLOFEN 20 MG PO TABS: 10.0000 mg | ORAL_TABLET | Freq: Three times a day (TID) | ORAL | 1 refills | Status: DC

## 2023-09-05 MED ORDER — OXYCODONE-ACETAMINOPHEN 10-325 MG PO TABS: 1.0000 | ORAL_TABLET | Freq: Three times a day (TID) | ORAL | 0 refills | Status: DC | PRN

## 2023-09-05 MED ORDER — SIMVASTATIN 10 MG PO TABS
10.0000 mg | ORAL_TABLET | ORAL | 1 refills | Status: DC
Start: 2023-09-05 — End: 2024-02-03

## 2023-09-05 MED ORDER — GABAPENTIN 600 MG PO TABS
600.0000 mg | ORAL_TABLET | Freq: Three times a day (TID) | ORAL | 1 refills | Status: DC
Start: 2023-09-05 — End: 2023-10-28

## 2023-09-05 MED ORDER — FAMOTIDINE 20 MG PO TABS: 20.0000 mg | ORAL_TABLET | Freq: Two times a day (BID) | ORAL | 1 refills | Status: DC

## 2023-09-05 MED ORDER — AMLODIPINE BESYLATE 10 MG PO TABS
10.0000 mg | ORAL_TABLET | Freq: Every day | ORAL | 1 refills | Status: DC
Start: 2023-09-05 — End: 2023-11-21

## 2023-09-05 MED ORDER — ESCITALOPRAM OXALATE 20 MG PO TABS: 20.0000 mg | ORAL_TABLET | Freq: Every day | ORAL | 1 refills | Status: DC

## 2023-09-05 NOTE — Patient Instructions (Addendum)

## 2023-09-05 NOTE — Progress Notes (Signed)
Patient ID: Nicholas Graham, male  DOB: 05-Nov-1950, 72 y.o.   MRN: [id number] Patient Care Team    Relationship Specialty Notifications Start End  Natalia Leatherwood, DO PCP - General Family Medicine  10/31/15   Eldred Manges, MD Consulting Physician Orthopedic Surgery  10/26/21   Armbruster, Willaim Rayas, MD Consulting Physician Gastroenterology  05/29/23   Noel Christmas, MD Consulting Physician Urology  05/29/23     Chief Complaint  Patient presents with   Follow-up    11 week follow up. No questions or concerns    Subjective:  Nicholas Graham is a 72 y.o. male present for Chronic Conditions/illness Management  All past medical history, surgical history, allergies, family history, immunizations, medications and social history were updated in the electronic medical record today. All recent labs, ED visits and hospitalizations within the last year were reviewed.   Cervical neck pain with evidence of disc disease/Chronic midline thoracic back pain/Thoracic degenerative disc disease/DDD (degenerative disc disease), lumbar/History of lumbar discectomy Pt presents for his chronic pain management treatment for his cervical and lumbar conditions. His discomfort has progressed since 2020-he feels he has some relief with his current regimen.   He has been evaluated  by neuro and pain rehab. He has received injections- that have only provided a few days of temporary relief in the past . He is frustrated, but has appropriate expectations that he will be living with some pain.  He reports he is maintaining a quality of life with the use of opiates, that allows him to remain active with Percocet 10-325 (1 tab every 8-12 hours), baclofen and gabapentin. 600/600/600 (had headaches at increased doses).  Indication for chronic opioid: Cervical spine degeneration, chronic neck and back pain. Medication and dose: Oxycodone-acetaminophen 10-325 1 tab TID prn daily # pills per month: 60 Last UDS date:UYD  08/2023 Pain contract signed (Y/N): Yes- UTD-08/2023 Date narcotic database last reviewed (/include red flags): 09/05/23  03/08/2021  had epidural injection of cervical spine and he feels it is helpful.  11/2021-has had repeat injections that have not been as helpful. 08/2023: Dr, Wallace Cullens- performed injections.  Original note: Lower cervical to mid thoracic pain still present. Using his right arm exacerbates his pain midline of thoracic.  He has a history thoracic spine degeneration and discectomy in his lumbar spine.  Has degenerative changes, bone spurring and facet arthropathy in his lumbar spine by x-rays obtained 11/2018. Cervical spine x-ray with mild bilateral foraminal narrowing at C3-C4 11/2018. Unfortunately he was reported as allergic or intolerant to many of the medications that would be helpful for him such as gabapentin, Lyrica, Celebrex and Cymbalta --> we discussed this in more detail last visit and he believes that the "allergy-hallucinations " was secondary to having multiple of these medicines on board at the same time back in 2014. We tried to start gabapentin and it did not have any side effects from medication. It has not been extremely helpful  Yet at lower doses. He admits to rather sigmnificant stomach upset starting from the naproxen BID use ober the last 8 week. Advanced imaging has not bee able to be completed 2/2 to covid outbreak. He desired to wait on referral to specialist last visit in hope that he would receive benefit from the gabapentin.   Hypertension/HLD/overweight: Pt reports compliance with amlodipine 10 mg daily, lisinopril 10 mg QD and HCTZ.  Patient denies chest pain, shortness of breath, dizziness or lower extremity edema.  Pt takes  a daily baby ASA. Pt is  prescribed statin.  Labs UTD Diet: Low-sodium Exercise: Not exercising as routinely. RF: Hypertension, hyperlipidemia, family history of heart disease. Overweight.   ANXIETY/sleep disorder:  Patient  reports he is feeling well on Lexapro 20 mg daily      06/19/2023    9:02 AM 01/18/2023   10:26 AM 11/13/2022    8:16 AM 06/15/2022    1:06 PM 11/30/2021    8:30 AM  Depression screen PHQ 2/9  Decreased Interest 0 0 0 0 0  Down, Depressed, Hopeless 0 0 0 0 0  PHQ - 2 Score 0 0 0 0 0  Altered sleeping     1  Tired, decreased energy     0  Change in appetite     1  Feeling bad or failure about yourself      0  Trouble concentrating     0  Moving slowly or fidgety/restless     0  Suicidal thoughts     0  PHQ-9 Score     2      11/30/2021    8:32 AM 01/14/2020    8:20 AM 12/16/2019    8:06 AM 06/23/2019    8:30 AM  GAD 7 : Generalized Anxiety Score  Nervous, Anxious, on Edge 0 0 0 0  Control/stop worrying 0 0 0 0  Worry too much - different things 0 0 0 0  Trouble relaxing 0 3 0 0  Restless 0 3 3 1   Easily annoyed or irritable 0 1 0 0  Afraid - awful might happen 0 0 0 0  Total GAD 7 Score 0 7 3 1   Anxiety Difficulty  Not difficult at all Somewhat difficult Not difficult at all            06/19/2023    9:02 AM 01/18/2023   10:26 AM 11/09/2022   10:50 AM 06/15/2022    1:06 PM 09/09/2021    9:24 AM  Fall Risk   Falls in the past year? 0 0 0 0 0  Number falls in past yr: 0 0  0   Injury with Fall? 0 0  0   Risk for fall due to : No Fall Risks   No Fall Risks   Follow up Falls evaluation completed Falls evaluation completed  Falls evaluation completed        There is no immunization history on file for this patient.   Past Medical History:  Diagnosis Date   Arm fracture    left. Dx'd w/RSD post fracture   Arthritis    Carpal tunnel syndrome    Chest pain 2016   [facility] (Dr. Marcelle Overlie) doing stress echo, but feels like musculoskeletal chest wall pain is cause of his discomfort   Chronic back pain    COVID-19 06/2020   Deviated septum    Diverticulosis    GERD (gastroesophageal reflux disease)    Hyperlipidemia    Hypertension    Joint pain    Muscle  pain    OSA on CPAP 07/04/2018   - Trial of CPAP therapy on 17 cm H2O with a Large size [employer] Full Face Mask Simplus mask and heated humidification.   Plantar fasciitis    Pulmonary nodule 04/2016   [place].  Pt chose to repeat CT chest w/out contrast in 3 mo---as per radiologist's recommendations.   Right inguinal hernia    fat-containing.  Small hydrocele in left hemiscrotum.   Sleep apnea  Snoring    Swelling    of legs/feet/hands   Tobacco dependence in remission    Allergies  Allergen Reactions   Penicillins Hives   Celebrex [Celecoxib]    Cymbalta [Duloxetine Hcl]    Lyrica [Pregabalin]    Past Surgical History:  Procedure Laterality Date   COLONOSCOPY     fracture repair left arm     had a fixator   INGUINAL HERNIA REPAIR Left 2013   repeat hernia repair. x2   IR RADIOLOGIST EVAL & MGMT  07/22/2023   KNEE ARTHROSCOPY Right    LUMBAR DISC SURGERY  1993   NEURECTOMY inguinal after hernia repair Left    PLANTAR FASCIA SURGERY Right 2000s   SHOULDER ARTHROSCOPY Right    UPPER GI ENDOSCOPY     Family History  Problem Relation Age of Onset   Lung cancer Mother    Lung cancer Father    Bone cancer Brother        bone   Heart disease Maternal Uncle    Colon cancer Neg Hx    Esophageal cancer Neg Hx    Stomach cancer Neg Hx    Social History   Social History Narrative   Married, Designer, television/film set. Children (2) Adult, 4 grandchildren.    9 th grade, Retired.   Wears seatbelt   Smoke detector in the home.    Firearms locked in the home.    Feels safe in his relationships.     Allergies as of 09/05/2023       Reactions   Penicillins Hives   Celebrex [celecoxib]    Cymbalta [duloxetine Hcl]    Lyrica [pregabalin]         Medication List        Accurate as of September 05, 2023  8:48 AM. If you have any questions, ask your nurse or doctor.          STOP taking these medications    metoCLOPramide 5 MG tablet Commonly known as: Reglan Stopped  by: Felix Pacini   sennosides-docusate sodium 8.6-50 MG tablet Commonly known as: SENOKOT-S Stopped by: Felix Pacini   sucralfate 1 g tablet Commonly known as: Carafate Stopped by: Felix Pacini       TAKE these medications    amLODipine 10 MG tablet Commonly known as: NORVASC Take 1 tablet (10 mg total) by mouth daily.   aspirin 81 MG tablet Take 81 mg by mouth daily.   baclofen 20 MG tablet Commonly known as: LIORESAL Take 0.5-1 tablets (10-20 mg total) by mouth 3 (three) times daily.   escitalopram 20 MG tablet Commonly known as: LEXAPRO Take 1 tablet (20 mg total) by mouth daily.   famotidine 20 MG tablet Commonly known as: Pepcid Take 1 tablet (20 mg total) by mouth 2 (two) times daily.   gabapentin 600 MG tablet Commonly known as: NEURONTIN Take 1 tablet (600 mg total) by mouth 3 (three) times daily. 1 tab morning, 1.5 tabs afternoon, 1 tab evening,  doses should be every 8 hours.   hydrochlorothiazide 25 MG tablet Commonly known as: HYDRODIURIL Take 1 tablet (25 mg total) by mouth daily.   lisinopril 10 MG tablet Commonly known as: ZESTRIL Take 1 tablet (10 mg total) by mouth daily.   lubiprostone 24 MCG capsule Commonly known as: AMITIZA Take 1 capsule (24 mcg total) by mouth 2 (two) times daily with a meal.   MegaRed Omega-3 Krill Oil 500 MG Caps Take 2 capsules by mouth daily.   Myrbetriq  50 MG Tb24 tablet Generic drug: mirabegron ER Take 50 mg by mouth daily.   oxyCODONE-acetaminophen 10-325 MG tablet Commonly known as: PERCOCET Take 1 tablet by mouth every 8 (eight) hours as needed for pain.   simvastatin 10 MG tablet Commonly known as: ZOCOR Take 1 tablet (10 mg total) by mouth every other day.       All past medical history, surgical history, allergies, family history, immunizations andmedications were updated in the EMR today and reviewed under the history and medication portions of their EMR.      MR SHOULDER RIGHT WO  CONTRAST Result Date: 05/03/2022 IMPRESSION: 1. Postsurgical changes consistent with interval rotator cuff repair and probable bicipital tenodesis with superior labral debridement. Mild supraspinatus and infraspinatus tendinosis without recurrent rotator cuff tear or muscular atrophy. 2. Interval development of prominent subcortical cyst formation and surrounding edema inferiorly in the acromion with associated subacromial-subdeltoid bursitis. Electronically Signed   By: Carey Bullocks M.D.   On: 05/03/2022 13:24    ROS 14 pt review of systems performed and negative (unless mentioned in an HPI)  Objective: BP 130/78   Pulse 92   Temp 98.5 F (36.9 C) (Oral)   Wt 190 lb 3.2 oz (86.3 kg)   SpO2 96%   BMI 25.80 kg/m  Physical Exam Vitals and nursing note reviewed.  Constitutional:      General: He is not in acute distress.    Appearance: Normal appearance. He is not ill-appearing, toxic-appearing or diaphoretic.  HENT:     Head: Normocephalic and atraumatic.  Eyes:     General: No scleral icterus.       Right eye: No discharge.        Left eye: No discharge.     Extraocular Movements: Extraocular movements intact.     Pupils: Pupils are equal, round, and reactive to light.  Cardiovascular:     Rate and Rhythm: Normal rate and regular rhythm.  Pulmonary:     Effort: Pulmonary effort is normal. No respiratory distress.     Breath sounds: Normal breath sounds. No wheezing, rhonchi or rales.  Musculoskeletal:     Right lower leg: No edema.     Left lower leg: No edema.  Skin:    General: Skin is warm.     Findings: No rash.  Neurological:     Mental Status: He is alert and oriented to person, place, and time. Mental status is at baseline.  Psychiatric:        Mood and Affect: Mood normal.        Behavior: Behavior normal.        Thought Content: Thought content normal.        Judgment: Judgment normal.     No results found.  Assessment/plan: Nicholas Graham is a 72 y.o.  male present for chronic condition management Cervical neck pain with evidence of disc disease/Chronic midline thoracic back pain/Thoracic degenerative disc disease/DDD (degenerative disc disease), lumbar/History of lumbar discectomy He is following with sports medicine, physical therapy and orthopedics.  - current pain management is helping him maintain activity and improve his quality of life. -[place] controlled substance database reviewed and appropriate 09/05/23 - Pain contract signed > UTD - UDS > collected today NSAIDs contraindicated with gastritis -Continue gabapentin  600/600/600> did not tolerate higher dose (headache) -Continue baclofen 10-20 mg 3 times daily -Continue percocet 10-325 mg tID #90 prescribed with 2 refills (held) at pharmacy.  - f/u 3 mos   Essential hypertension,  benign/dyslipidemia/overweight Stable Continue amlodipine 10 mg daily  Continue HCTZ. Continue Zocor 10 mg q. OD - continue fish oil. - continue ASA 81 mg - routine diet and exercise. Labs up-to-date 06-2023   Gastroesophageal reflux disease with esophagitis Protonix twice daily, Pepcid twice daily Avoid all NSAIDs.  Caution on spicy foods and alcohol use.   Anxiety/sleep disturbance:  Stable Continue lexapro 20 mg qd.   OSA on CPAP Compliant  Return in about 11 weeks (around 11/21/2023) for Routine chronic condition follow-up.   Orders Placed This Encounter  Procedures   Drug Monitoring Panel [id number] , Urine   Meds ordered this encounter  Medications   simvastatin (ZOCOR) 10 MG tablet    Sig: Take 1 tablet (10 mg total) by mouth every other day.    Dispense:  45 tablet    Refill:  1   hydrochlorothiazide (HYDRODIURIL) 25 MG tablet    Sig: Take 1 tablet (25 mg total) by mouth daily.    Dispense:  90 tablet    Refill:  1   gabapentin (NEURONTIN) 600 MG tablet    Sig: Take 1 tablet (600 mg total) by mouth 3 (three) times daily. 1 tab morning, 1.5 tabs afternoon, 1 tab evening,   doses should be every 8 hours.    Dispense:  270 tablet    Refill:  1    Pt reports not true allergy- was a combination of meds that caused symptoms.   escitalopram (LEXAPRO) 20 MG tablet    Sig: Take 1 tablet (20 mg total) by mouth daily.    Dispense:  90 tablet    Refill:  1   baclofen (LIORESAL) 20 MG tablet    Sig: Take 0.5-1 tablets (10-20 mg total) by mouth 3 (three) times daily.    Dispense:  270 tablet    Refill:  1   amLODipine (NORVASC) 10 MG tablet    Sig: Take 1 tablet (10 mg total) by mouth daily.    Dispense:  90 tablet    Refill:  1   lisinopril (ZESTRIL) 10 MG tablet    Sig: Take 1 tablet (10 mg total) by mouth daily.    Dispense:  90 tablet    Refill:  1   lubiprostone (AMITIZA) 24 MCG capsule    Sig: Take 1 capsule (24 mcg total) by mouth 2 (two) times daily with a meal.    Dispense:  60 capsule    Refill:  5   famotidine (PEPCID) 20 MG tablet    Sig: Take 1 tablet (20 mg total) by mouth 2 (two) times daily.    Dispense:  180 tablet    Refill:  1   oxyCODONE-acetaminophen (PERCOCET) 10-325 MG tablet    Sig: Take 1 tablet by mouth every 8 (eight) hours as needed for pain.    Dispense:  90 tablet    Refill:  0   Referral Orders  No referral(s) requested today     Note is dictated utilizing voice recognition software. Although note has been proof read prior to signing, occasional typographical errors still can be missed. If any questions arise, please do not hesitate to call for verification.  Electronically signed by: Felix Pacini, DO [facility] [place]

## 2023-09-06 ENCOUNTER — Ambulatory Visit (HOSPITAL_COMMUNITY)
Admission: RE | Admit: 2023-09-06 | Discharge: 2023-09-06 | Disposition: A | Discharge status: Home / Self Care | Payer: PPO | Source: Ambulatory Visit | Attending: Gastroenterology | Admitting: Gastroenterology

## 2023-09-06 DIAGNOSIS — R109 Unspecified abdominal pain: Secondary | ICD-10-CM | POA: Diagnosis not present

## 2023-09-06 MED ORDER — TECHNETIUM TC 99M MEBROFENIN IV KIT
5.3000 | PACK | Freq: Once | INTRAVENOUS | Status: AC | PRN
  Administered 2023-09-06: 5.3 via INTRAVENOUS

## 2023-09-07 LAB — DRUG MONITORING PANEL 376104, URINE
Amphetamines: NEGATIVE ng/mL (ref <500)
Barbiturates: NEGATIVE ng/mL (ref <300)
Benzodiazepines: NEGATIVE ng/mL (ref <100)
Cocaine Metabolite: NEGATIVE ng/mL (ref <150)
Codeine: NEGATIVE ng/mL (ref <50)
Desmethyltramadol: NEGATIVE ng/mL (ref <100)
Hydrocodone: NEGATIVE ng/mL (ref <50)
Hydromorphone: NEGATIVE ng/mL (ref <50)
Morphine: NEGATIVE ng/mL (ref <50)
Norhydrocodone: NEGATIVE ng/mL (ref <50)
Noroxycodone: 2380 ng/mL — ABNORMAL HIGH (ref <50)
Opiates: NEGATIVE ng/mL (ref <100)
Oxycodone: 628 ng/mL — ABNORMAL HIGH (ref <50)
Oxycodone: POSITIVE ng/mL — AB (ref <100)
Oxymorphone: 1029 ng/mL — ABNORMAL HIGH (ref <50)
Tramadol: NEGATIVE ng/mL (ref <100)

## 2023-09-07 LAB — DM TEMPLATE

## 2023-09-12 NOTE — Telephone Encounter (Signed)
Patient calling in regards to previous note. Please advise.

## 2023-09-12 NOTE — Telephone Encounter (Signed)
Patient had scan on 12-20.  It resulted and Dr. Adela Lank sent patient MyChart message on 12-20 at 5:18pm.  Called patient to discuss but phone rings and rings and then goes busy. Unable to leave a message.

## 2023-09-15 NOTE — Progress Notes (Addendum)
 @Patient  ID: Nicholas Graham, male    DOB: [birth year], 72 y.o.   MRN: [id number]  Chief Complaint  Patient presents with   Consult    Sleep OSA CPAP [employer]. Epworth : 1    Referring provider: Catherine Charlies LABOR, DO  HPI: 72 year old male, former smoker quit in 2003 (90-pack-year history).  Past medical history significant for hypertension, OSA on CPAP.  Patient of Dr. Jude, last seen in office on 11/26/2018.  08/24/2022 Patient presents today for overdue follow-up for OSA.   He has severe OSA HST 05/2018 severe OSA, AHI 57/hour, lowest desaturation 67%  On auto CPAP 10-16cm h20 with large full face mask  He reports compliance with CPAP.   He is having trouble with nasal pillow mask. Continues to have snoring symptoms.  He would like to change masks to resmed airfit n20 nasal cpap mask  Due for new CPAP machine in October 2024 Recommend trying wedge pillow   Airview download 07/25/22-08/23/22 Usage 29/30 days (97%); 18 days (60%) > 4 hours Average usage days used 4 hours 23 mins Pressure 10-15cm h20 (12.1cm h20-95%) Airleaks 33L/min (95%) AHI 1.0       Sleep questionnaire Symptoms-   Wakes up often; Snoring  Prior sleep study- 2019 Bedtime- 8pm Time to fall asleep- 10 mins  Nocturnal awakenings- 2-3 times Out of bed/start of day- 5am Weight changes- down 30 lbs since 2020 Do you operate heavy machinery- no Do you currently wear CPAP- yes Do you current wear oxygen- no Epworth- 2   Imaging: CT chest without contrast 04/2016 showed calcified granulomas and calcified mediastinal lymphadenopathy with a 7 x 8 mm nodule in the left mid lung which had a speck of calcification eccentric.     Assessment & Plan:   OSA on CPAP - Hx severe OSA, AHI 57/hour. Well controlled on auto CPAP. He is 97% compliant with CPAP use over the last 90 days, average usage 4 hours 23 mins. He continues to have snoring symptoms. Current pressure 10-15cm h20 without residual apneas. He would like  to change masks, DME order placed for mask fitting and renew CPAP supplies. Recommend patient try wedge pillow to help with snoring. He has lost 30 lbs since 2020. FU with Dr. Neysa (new patient) in 1 year or sooner if needed.  Almarie LELON Ferrari, NP 08/24/2022 ============================================   09/17/23- 72 yoM former smoker (90 pkyrs, quit 2003) followed for OSA, complicated by HTN, Thrombocytopenia,  New to me today, Seen with wife. HST 05/2018 severe OSA, AHI 57/hour, lowest desaturation 67% CPAP auto 10-15       was due for replacement October, 2024 Download compliance- 5%, AHI 4.4/hr Body weight today-     192 lbs                                          // Note lung mass//  New issue OSA can be dealt with later. Focus today on LUL mass. I reviewed images with him and wife. He admits only occasional cough bringing up scant white mucus. Not aware of any adenopathy. An older discrete LUL nodule is stable, with calcium, likely old granuloma. He has been evaluated for RUQ/lateral chest pain without trauma.  Describes 1-2 days of black stools w/o recurrence. This was noted after taking a prescribed short term medicine he can't identify.   CT chest 08/29/23-  ordered  by Dr Corey/ [facility]   IMPRESSION: No fracture is seen. Specifically, the right ribs are intact. 3.6 cm left upper lobe mass, new from the prior, raising concern for primary bronchogenic carcinoma, specifically invasive adenocarcinoma. Aortic Atherosclerosis (ICD10-I70.0).  Prior to Admission medications   Medication Sig Start Date End Date Taking? Authorizing Provider  amLODipine  (NORVASC ) 10 MG tablet Take 1 tablet (10 mg total) by mouth daily. 09/05/23  Yes Kuneff, Renee A, DO  aspirin  81 MG tablet Take 81 mg by mouth daily.   Yes [provider]  baclofen  (LIORESAL ) 20 MG tablet Take 0.5-1 tablets (10-20 mg total) by mouth 3 (three) times daily. 09/05/23  Yes Kuneff, Renee A, DO  escitalopram   (LEXAPRO ) 20 MG tablet Take 1 tablet (20 mg total) by mouth daily. 09/05/23  Yes Kuneff, Renee A, DO  famotidine  (PEPCID ) 20 MG tablet Take 1 tablet (20 mg total) by mouth 2 (two) times daily. 09/05/23  Yes Kuneff, Renee A, DO  gabapentin  (NEURONTIN ) 600 MG tablet Take 1 tablet (600 mg total) by mouth 3 (three) times daily. 1 tab morning, 1.5 tabs afternoon, 1 tab evening,  doses should be every 8 hours. 09/05/23  Yes Kuneff, Renee A, DO  hydrochlorothiazide  (HYDRODIURIL ) 25 MG tablet Take 1 tablet (25 mg total) by mouth daily. 09/05/23  Yes Kuneff, Renee A, DO  lisinopril  (ZESTRIL ) 10 MG tablet Take 1 tablet (10 mg total) by mouth daily. 09/05/23  Yes Kuneff, Renee A, DO  lubiprostone  (AMITIZA ) 24 MCG capsule Take 1 capsule (24 mcg total) by mouth 2 (two) times daily with a meal. 09/05/23  Yes Kuneff, Renee A, DO  MegaRed Omega-3 Krill Oil 500 MG CAPS Take 2 capsules by mouth daily.    Yes [provider]  MYRBETRIQ  50 MG TB24 tablet Take 50 mg by mouth daily. 05/17/23  Yes [provider]  oxyCODONE -acetaminophen  (PERCOCET) 10-325 MG tablet Take 1 tablet by mouth every 8 (eight) hours as needed for pain. 09/05/23  Yes Kuneff, Renee A, DO  simvastatin  (ZOCOR ) 10 MG tablet Take 1 tablet (10 mg total) by mouth every other day. 09/05/23  Yes Kuneff, Renee A, DO   Past Medical History:  Diagnosis Date   Arm fracture    left. Dx'd w/RSD post fracture   Arthritis    Carpal tunnel syndrome    Chest pain 2016   [facility] (Dr. Johnnye) doing stress echo, but feels like musculoskeletal chest wall pain is cause of his discomfort   Chronic back pain    COVID-19 06/2020   Deviated septum    Diverticulosis    GERD (gastroesophageal reflux disease)    Hyperlipidemia    Hypertension    Joint pain    Muscle pain    OSA on CPAP 07/04/2018   - Trial of CPAP therapy on 17 cm H2O with a Large size Fisher&Paykel Full Face Mask Simplus mask and heated humidification.   Plantar  fasciitis    Pulmonary nodule 04/2016   Lingula.  Pt chose to repeat CT chest w/out contrast in 3 mo---as per radiologist's recommendations.   Right inguinal hernia    fat-containing.  Small hydrocele in left hemiscrotum.   Sleep apnea    Snoring    Swelling    of legs/feet/hands   Tobacco dependence in remission    Past Surgical History:  Procedure Laterality Date   COLONOSCOPY     fracture repair left arm     had a fixator   INGUINAL HERNIA REPAIR  Left 2013   repeat hernia repair. x2   IR RADIOLOGIST EVAL & MGMT  07/22/2023   KNEE ARTHROSCOPY Right    LUMBAR DISC SURGERY  1993   NEURECTOMY inguinal after hernia repair Left    PLANTAR FASCIA SURGERY Right 2000s   SHOULDER ARTHROSCOPY Right    UPPER GI ENDOSCOPY     Family History  Problem Relation Age of Onset   Lung cancer Mother    Lung cancer Father    Bone cancer Brother        bone   Heart disease Maternal Uncle    Colon cancer Neg Hx    Esophageal cancer Neg Hx    Stomach cancer Neg Hx    Social History   Socioeconomic History   Marital status: Married    Spouse name: Not on file   Number of children: 2   Years of education: Not on file   Highest education level: 8th grade  Occupational History   Not on file  Tobacco Use   Smoking status: Former    Current packs/day: 0.00    Average packs/day: 3.0 packs/day for 30.0 years (90.0 ttl pk-yrs)    Types: Cigarettes    Start date: 07/16/1972    Quit date: 07/16/2002    Years since quitting: 21.1   Smokeless tobacco: Never  Vaping Use   Vaping status: Never Used  Substance and Sexual Activity   Alcohol use: Yes    Alcohol/week: 1.0 - 2.0 standard drink of alcohol    Types: 1 - 2 Cans of beer per week    Comment: socially   Drug use: No   Sexual activity: Yes  Other Topics Concern   Not on file  Social History Narrative   Married, [place]. Children (2) Adult, 4 grandchildren.    9 th grade, Retired.   Wears seatbelt   Smoke detector in the home.     Firearms locked in the home.    Feels safe in his relationships.    Social Drivers of Corporate Investment Banker Strain: Low Risk  (06/19/2023)   Overall Financial Resource Strain (CARDIA)    Difficulty of Paying Living Expenses: Not hard at all  Food Insecurity: No Food Insecurity (06/19/2023)   Hunger Vital Sign    Worried About Running Out of Food in the Last Year: Never true    Ran Out of Food in the Last Year: Never true  Transportation Needs: No Transportation Needs (06/19/2023)   PRAPARE - Administrator, Civil Service (Medical): No    Lack of Transportation (Non-Medical): No  Physical Activity: Sufficiently Active (06/19/2023)   Exercise Vital Sign    Days of Exercise per Week: 7 days    Minutes of Exercise per Session: 70 min  Stress: No Stress Concern Present (06/19/2023)   [employer] of Occupational Health - Occupational Stress Questionnaire    Feeling of Stress : Not at all  Social Connections: Moderately Isolated (06/19/2023)   Social Connection and Isolation Panel [NHANES]    Frequency of Communication with Friends and Family: Three times a week    Frequency of Social Gatherings with Friends and Family: Once a week    Attends Religious Services: Never    Database Administrator or Organizations: No    Attends Banker Meetings: Never    Marital Status: Married  Catering Manager Violence: Not At Risk (06/19/2023)   Humiliation, Afraid, Rape, and Kick questionnaire    Fear of Current  or Ex-Partner: No    Emotionally Abused: No    Physically Abused: No    Sexually Abused: No   ROS-see HPI   + = positive Constitutional:    weight loss, night sweats, fevers, chills, fatigue, lassitude. HEENT:    headaches, difficulty swallowing, tooth/dental problems, sore throat,       sneezing, itching, ear ache, nasal congestion, post nasal drip, snoring CV:    chest pain, orthopnea, PND, swelling in lower extremities, anasarca, dizziness,  palpitations Resp:   shortness of breath with exertion or at rest.               + productive cough,   non-productive cough, coughing up of blood.              change in color of mucus.  wheezing.   Skin:    rash or lesions. GI:  No-   heartburn, indigestion, + abdominal pain, nausea, vomiting, diarrhea,                + change in bowel habits, loss of appetite GU: dysuria, change in color of urine, no urgency or frequency.   flank pain. MS:   joint pain, stiffness, decreased range of motion, back pain. Neuro-     nothing unusual Psych:  change in mood or affect.  depression or anxiety.   memory loss.  OBJ- Physical Exam General- Alert, Oriented, Affect-appropriate, Distress- none acute Skin- rash-none, lesions- none, excoriation- none Lymphadenopathy- none Head- atraumatic            Eyes- Gross vision intact, PERRLA, conjunctivae and secretions clear            Ears- Hearing, canals-normal            Nose- Clear, no-Septal dev, mucus, polyps, erosion, perforation             Throat- Mallampati III , mucosa clear , drainage- none, tonsils- atrophic Neck- flexible , trachea midline, no stridor , thyroid  nl, carotid no bruit Chest - symmetrical excursion , unlabored           Heart/CV- RRR , no murmur , no gallop  , no rub, nl s1 s2                           - JVD- none , edema- none, stasis changes- none, varices- none           Lung- clear to P&A, wheeze- none, cough- none , dullness-none, rub- none           Chest wall-  Abd-  Br/ Gen/ Rectal- Not done, not indicated Extrem- cyanosis- none, clubbing, none, atrophy- none, strength- nl Neuro- grossly intact to observation

## 2023-09-16 NOTE — Progress Notes (Signed)
Chest CT scan is concerning showing a lung mass.  You are seeing pulmonology tomorrow.  I will make sure your primary care provider is aware of this result and we will either myself refer directly to oncology or Dr. Maple Hudson your pulmonologist well.  We do not know for sure what is wrong but will work to figure it out as soon as possible.

## 2023-09-17 ENCOUNTER — Encounter: Payer: Self-pay | Admitting: Internal Medicine

## 2023-09-17 ENCOUNTER — Ambulatory Visit: Payer: PPO | Admitting: Internal Medicine

## 2023-09-17 VITALS — BP 124/74 | HR 82 | Ht 72.0 in | Wt 192.4 lb

## 2023-09-17 DIAGNOSIS — R918 Other nonspecific abnormal finding of lung field: Secondary | ICD-10-CM | POA: Diagnosis not present

## 2023-09-17 LAB — CBC WITH DIFFERENTIAL/PLATELET
Basophils Absolute: 0.2 10*3/uL — ABNORMAL HIGH (ref 0.0–0.1)
Basophils Relative: 2.2 % (ref 0.0–3.0)
Eosinophils Absolute: 0 10*3/uL (ref 0.0–0.7)
Eosinophils Relative: 0.7 % (ref 0.0–5.0)
HCT: 45.1 % (ref 39.0–52.0)
Hemoglobin: 15.1 g/dL (ref 13.0–17.0)
Lymphocytes Relative: 16.8 % (ref 12.0–46.0)
Lymphs Abs: 1.2 10*3/uL (ref 0.7–4.0)
MCHC: 33.5 g/dL (ref 30.0–36.0)
MCV: 92.4 fL (ref 78.0–100.0)
Monocytes Absolute: 0.5 10*3/uL (ref 0.1–1.0)
Monocytes Relative: 7.3 % (ref 3.0–12.0)
Neutro Abs: 5.1 10*3/uL (ref 1.4–7.7)
Neutrophils Relative %: 73 % (ref 43.0–77.0)
Platelets: 171 10*3/uL (ref 150.0–400.0)
RBC: 4.88 Mil/uL (ref 4.22–5.81)
RDW: 13.9 % (ref 11.5–15.5)
WBC: 6.9 10*3/uL (ref 4.0–10.5)

## 2023-09-17 LAB — COMPREHENSIVE METABOLIC PANEL
ALT: 15 U/L (ref 0–53)
AST: 20 U/L (ref 0–37)
Albumin: 4.7 g/dL (ref 3.5–5.2)
Alkaline Phosphatase: 62 U/L (ref 39–117)
BUN: 20 mg/dL (ref 6–23)
CO2: 29 meq/L (ref 19–32)
Calcium: 9.5 mg/dL (ref 8.4–10.5)
Chloride: 98 meq/L (ref 96–112)
Creatinine, Ser: 0.91 mg/dL (ref 0.40–1.50)
GFR: 85.59 mL/min (ref >60.00)
Glucose, Bld: 102 mg/dL — ABNORMAL HIGH (ref 70–99)
Potassium: 4.4 meq/L (ref 3.5–5.1)
Sodium: 135 meq/L (ref 135–145)
Total Bilirubin: 0.9 mg/dL (ref 0.2–1.2)
Total Protein: 7 g/dL (ref 6.0–8.3)

## 2023-09-17 NOTE — Patient Instructions (Signed)
Order- Schedule PFT  dx lung mass  Order- lab- CBC w diff, CMET, PT,PTT,   dx lung mass  Order- schedule PET scan neck to thigh  dx lung mass  Order- referral to either Dr Delton Coombes or Dr Tonia Brooms- first available  new lung mass

## 2023-09-18 LAB — PROTIME-INR
INR: 0.9
Prothrombin Time: 10.3 s (ref 9.0–11.5)

## 2023-09-18 LAB — APTT: aPTT: 29 s (ref 23–32)

## 2023-09-19 NOTE — Telephone Encounter (Signed)
 Results seen 12-31

## 2023-09-25 ENCOUNTER — Ambulatory Visit (HOSPITAL_COMMUNITY)
Admission: RE | Admit: 2023-09-25 | Discharge: 2023-09-25 | Disposition: A | Discharge status: Home / Self Care | Payer: PPO | Source: Ambulatory Visit | Attending: Internal Medicine | Admitting: Internal Medicine

## 2023-09-25 DIAGNOSIS — R918 Other nonspecific abnormal finding of lung field: Secondary | ICD-10-CM | POA: Insufficient documentation

## 2023-09-25 DIAGNOSIS — R911 Solitary pulmonary nodule: Secondary | ICD-10-CM | POA: Diagnosis not present

## 2023-09-25 LAB — GLUCOSE, CAPILLARY: Glucose-Capillary: 121 mg/dL — ABNORMAL HIGH (ref 70–99)

## 2023-09-25 MED ORDER — FLUDEOXYGLUCOSE F - 18 (FDG) INJECTION
9.6000 | Freq: Once | INTRAVENOUS | Status: AC
  Administered 2023-09-25: 9.3 via INTRAVENOUS

## 2023-10-02 ENCOUNTER — Telehealth: Payer: Self-pay | Admitting: Internal Medicine

## 2023-10-02 NOTE — Telephone Encounter (Signed)
 Patient would like to know the results of his PET scan taken on January 8th.

## 2023-10-04 ENCOUNTER — Telehealth: Payer: Self-pay | Admitting: Acute Care

## 2023-10-04 NOTE — Telephone Encounter (Signed)
Ladies, this is a patient of Dr. Maple Hudson. Dr. Katrinka Blazing did a CT Chest for right shoulder rib pain x 2-3 months on 08/29/2023. He was seen by Dr. Maple Hudson, who has ordered a PET scan. It was done 09/25/2023, but has not been read. I have called the reading room to see if they can get this PET read by Monday so we can determine if we need to get him seen as a nodule consult before the March date he is currently scheduled to see Dr. Delton Coombes.

## 2023-10-08 ENCOUNTER — Ambulatory Visit (INDEPENDENT_AMBULATORY_CARE_PROVIDER_SITE_OTHER): Payer: PPO | Admitting: Family Medicine

## 2023-10-08 VITALS — BP 112/68 | HR 75 | Temp 97.5°F | Wt 194.6 lb

## 2023-10-08 DIAGNOSIS — R590 Localized enlarged lymph nodes: Secondary | ICD-10-CM | POA: Diagnosis not present

## 2023-10-08 NOTE — Progress Notes (Signed)
Nicholas Graham , 12/16/1950, 73 y.o., male MRN: [id number] Patient Care Team    Relationship Specialty Notifications Start End  Nicholas Leatherwood, DO PCP - General Family Medicine  10/31/15   Nicholas Manges, MD Consulting Physician Orthopedic Surgery  10/26/21   Armbruster, Nicholas Rayas, MD Consulting Physician Gastroenterology  05/29/23   Nicholas Christmas, MD Consulting Physician Urology  05/29/23   Nicholas Budge, MD Consulting Physician Pulmonary Disease  10/08/23     Chief Complaint  Patient presents with   Adenopathy    Knots on both side of neck     Subjective: DECLIN Nicholas Graham is a 73 y.o. Pt presents for an OV with complaints of knots of bilateral neck of a few days duration.  Associated symptoms include mild cough 2 weeks. Patient recently diagnosed with pulmonary nodule concerning for lung cancer left lobe.  He has recently established with pulmonology.  Recent PET scan completed 2 weeks ago was negative for hypermetabolic lymph nodes of the neck.  PET scan 09/25/2023: IMPRESSION: 1. Persistent platelike mass density in the LEFT upper lobe with central metabolic activity. Indeterminate lesion. Persistence of lesion is concerning for bronchogenic carcinoma. Recommend bronchoscopy for further evaluation. 2. No evidence of metastatic adenopathy or distant metastatic disease. 3. Benign nodule in the lingula.    10/08/2023    8:19 AM 06/19/2023    9:02 AM 01/18/2023   10:26 AM 11/13/2022    8:16 AM 06/15/2022    1:06 PM  Depression screen PHQ 2/9  Decreased Interest 0 0 0 0 0  Down, Depressed, Hopeless 0 0 0 0 0  PHQ - 2 Score 0 0 0 0 0    Allergies  Allergen Reactions   Nicholas Graham Hives   Nicholas Graham [Celecoxib]    Nicholas Graham [Duloxetine Hcl]    Nicholas Graham [Pregabalin]    Social History   Social History Narrative   Married, [place]. Children (2) Adult, 4 grandchildren.    9 th grade, Retired.   Wears seatbelt   Smoke detector in the home.    Firearms locked in the  home.    Feels safe in his relationships.    Past Medical History:  Diagnosis Date   Arm fracture    left. Dx'd w/RSD post fracture   Arthritis    Carpal tunnel syndrome    Chest pain 2016   [facility] (Dr. Marcelle Graham) doing stress echo, but feels like musculoskeletal chest wall pain is cause of his discomfort   Chronic back pain    COVID-19 06/2020   Deviated septum    Diverticulosis    GERD (gastroesophageal reflux disease)    Hyperlipidemia    Hypertension    Joint pain    Muscle pain    OSA on CPAP 07/04/2018   - Trial of CPAP therapy on 17 cm H2O with a Large size Fisher&Paykel Full Face Mask Simplus mask and heated humidification.   Plantar fasciitis    Pulmonary nodule 04/2016   Lingula.  Pt chose to repeat CT chest w/out contrast in 3 mo---as per radiologist's recommendations.   Right inguinal hernia    fat-containing.  Small hydrocele in left hemiscrotum.   Sleep apnea    Snoring    Swelling    of legs/feet/hands   Tobacco dependence in remission    Past Surgical History:  Procedure Laterality Date   COLONOSCOPY     fracture repair left arm     had a fixator  INGUINAL HERNIA REPAIR Left 2013   repeat hernia repair. x2   IR RADIOLOGIST EVAL & MGMT  07/22/2023   KNEE ARTHROSCOPY Right    LUMBAR DISC SURGERY  1993   NEURECTOMY inguinal after hernia repair Left    PLANTAR FASCIA SURGERY Right 2000s   SHOULDER ARTHROSCOPY Right    UPPER GI ENDOSCOPY     Family History  Problem Relation Age of Onset   Lung cancer Mother    Lung cancer Father    Bone cancer Brother        bone   Heart disease Maternal Uncle    Colon cancer Neg Hx    Esophageal cancer Neg Hx    Stomach cancer Neg Hx    Allergies as of 10/08/2023       Reactions   Nicholas Graham Hives   Nicholas Graham [celecoxib]    Nicholas Graham [duloxetine Hcl]    Nicholas Graham [pregabalin]         Medication List        Accurate as of October 08, 2023  8:38 AM. If you have any questions, ask your nurse or  doctor.          amLODipine 10 MG tablet Commonly known as: NORVASC Take 1 tablet (10 mg total) by mouth daily.   aspirin 81 MG tablet Take 81 mg by mouth daily.   baclofen 20 MG tablet Commonly known as: LIORESAL Take 0.5-1 tablets (10-20 mg total) by mouth 3 (three) times daily.   escitalopram 20 MG tablet Commonly known as: LEXAPRO Take 1 tablet (20 mg total) by mouth daily.   famotidine 20 MG tablet Commonly known as: Pepcid Take 1 tablet (20 mg total) by mouth 2 (two) times daily.   gabapentin 600 MG tablet Commonly known as: NEURONTIN Take 1 tablet (600 mg total) by mouth 3 (three) times daily. 1 tab morning, 1.5 tabs afternoon, 1 tab evening,  doses should be every 8 hours.   hydrochlorothiazide 25 MG tablet Commonly known as: HYDRODIURIL Take 1 tablet (25 mg total) by mouth daily.   lisinopril 10 MG tablet Commonly known as: ZESTRIL Take 1 tablet (10 mg total) by mouth daily.   lubiprostone 24 MCG capsule Commonly known as: AMITIZA Take 1 capsule (24 mcg total) by mouth 2 (two) times daily with a meal.   MegaRed Omega-3 Krill Oil 500 MG Caps Take 2 capsules by mouth daily.   Myrbetriq 50 MG Tb24 tablet Generic drug: mirabegron ER Take 50 mg by mouth daily.   oxyCODONE-acetaminophen 10-325 MG tablet Commonly known as: PERCOCET Take 1 tablet by mouth every 8 (eight) hours as needed for pain.   simvastatin 10 MG tablet Commonly known as: ZOCOR Take 1 tablet (10 mg total) by mouth every other day.        All past medical history, surgical history, allergies, family history, immunizations andmedications were updated in the EMR today and reviewed under the history and medication portions of their EMR.     ROS Negative, with the exception of above mentioned in HPI   Objective:  BP 112/68   Pulse 75   Temp (!) 97.5 F (36.4 C)   Wt 194 lb 9.6 oz (88.3 kg)   SpO2 98%   BMI 26.39 kg/m  Body mass index is 26.39 kg/m. Physical Exam Vitals and  nursing note reviewed.  Constitutional:      General: He is not in acute distress.    Appearance: Normal appearance. He is not ill-appearing, toxic-appearing or diaphoretic.  HENT:     Head: Normocephalic and atraumatic.     Nose: No congestion or rhinorrhea.     Mouth/Throat:     Pharynx: No oropharyngeal exudate or posterior oropharyngeal erythema.  Eyes:     General: No scleral icterus.       Right eye: No discharge.        Left eye: No discharge.     Extraocular Movements: Extraocular movements intact.     Pupils: Pupils are equal, round, and reactive to light.  Cardiovascular:     Rate and Rhythm: Normal rate and regular rhythm.  Pulmonary:     Effort: Pulmonary effort is normal. No respiratory distress.     Breath sounds: Normal breath sounds. No wheezing, rhonchi or rales.  Musculoskeletal:     Cervical back: Neck supple. No tenderness.  Lymphadenopathy:     Cervical: No cervical adenopathy.  Skin:    General: Skin is warm and dry.     Coloration: Skin is not jaundiced or pale.     Findings: No rash.  Neurological:     Mental Status: He is alert and oriented to person, place, and time. Mental status is at baseline.  Psychiatric:        Mood and Affect: Mood normal.        Behavior: Behavior normal.        Thought Content: Thought content normal.        Judgment: Judgment normal.     No results found. No results found. No results found for this or any previous visit (from the past 24 hours).  Assessment/Plan: KRAIG BONDI is a 73 y.o. male present for OV for  LAD (lymphadenopathy), submandibular (Primary) Pts area of concern is his submandibular glands, which are non-tender, mobile and feel to be WNL of size today. He has recently noticed them, so they may be mildly enlarging for him to notice. He is under the care of pulm for possible/intermediate LUL nodule concern for bronchogenic cancer.  PET scan 2 weeks ago without enlarged neck lymph nodes.  He will  continue to monitor submandibular nodules and if enlarging, will follow with pulm.  Will see Dr. Delton Coombes this week.   Reviewed expectations re: course of current medical issues. Discussed self-management of symptoms. Outlined signs and symptoms indicating need for more acute intervention. Patient verbalized understanding and all questions were answered. Patient received an After-Visit Summary.    No orders of the defined types were placed in this encounter.  No orders of the defined types were placed in this encounter.  Referral Orders  No referral(s) requested today     Note is dictated utilizing voice recognition software. Although note has been proof read prior to signing, occasional typographical errors still can be missed. If any questions arise, please do not hesitate to call for verification.   electronically signed by:  Felix Pacini, DO  [facility] - [place]

## 2023-10-08 NOTE — Telephone Encounter (Signed)
Spoke with pt and given appt with Dr Delton Coombes 10/10/23 4:00. Pt verbalized understanding.

## 2023-10-10 ENCOUNTER — Ambulatory Visit: Payer: PPO | Admitting: Emergency Medicine

## 2023-10-10 ENCOUNTER — Encounter: Payer: Self-pay | Admitting: Emergency Medicine

## 2023-10-10 VITALS — BP 151/88 | HR 81 | Ht 72.0 in | Wt 196.6 lb

## 2023-10-10 DIAGNOSIS — Z85118 Personal history of other malignant neoplasm of bronchus and lung: Secondary | ICD-10-CM | POA: Insufficient documentation

## 2023-10-10 DIAGNOSIS — R1013 Epigastric pain: Secondary | ICD-10-CM

## 2023-10-10 DIAGNOSIS — G4733 Obstructive sleep apnea (adult) (pediatric): Secondary | ICD-10-CM

## 2023-10-10 DIAGNOSIS — C3412 Malignant neoplasm of upper lobe, left bronchus or lung: Secondary | ICD-10-CM | POA: Insufficient documentation

## 2023-10-10 DIAGNOSIS — R109 Unspecified abdominal pain: Secondary | ICD-10-CM | POA: Insufficient documentation

## 2023-10-10 DIAGNOSIS — R918 Other nonspecific abnormal finding of lung field: Secondary | ICD-10-CM

## 2023-10-10 DIAGNOSIS — J449 Chronic obstructive pulmonary disease, unspecified: Secondary | ICD-10-CM

## 2023-10-10 NOTE — Telephone Encounter (Signed)
R/T call LM radiologist has not read scan as of today. They will get a call once CY has reviewed. NFN

## 2023-10-10 NOTE — Assessment & Plan Note (Addendum)
Irregular mixed density left upper lobe mass new compared with 2017.  Very suspicious for primary malignancy, probably adenocarcinoma.  No evidence of distant disease on this PET scan.  Reviewed all of this with him.  I recommended a navigational bronchoscopy to get a tissue diagnosis and he agrees.  We will try to get this set up for 10/28/2023.  Unclear whether he would be a surgical candidate but pulmonary function testing has been ordered.  I will try to get it moved up to be done sooner.  Left Upper Lobe Mass New 3.6 x 2.2 cm predominantly solid mass in the left upper lobe with associated mild pleural retraction. Mild activity on PET scan. Differential includes adenocarcinoma vs. inflammatory focus. No evidence of local spread on imaging. -Plan bronchoscopy to obtain biopsy and culture. -Stop aspirin 2 days prior to bronchoscopy.

## 2023-10-10 NOTE — Assessment & Plan Note (Signed)
Obstructive Sleep Apnea Not using CPAP machine for the past 2 months. -Encourage resumption of CPAP use.

## 2023-10-10 NOTE — Progress Notes (Signed)
Subjective:    Patient ID: Nicholas Graham, male    DOB: Mar 03, 1951, 73 y.o.   MRN: [id number]  HPI The patient is a 72 year old former smoker with a 90 pack-year history, who also has a history of hypertension, obstructive sleep apnea, diverticulosis, gastroesophageal reflux disease, and chronic back pain. The patient has been under observation for a lingular nodule since 2017. The patient presents today for discussion of an abnormal CT scan of the chest and an abnormal PET scan.  The CT scan, conducted to evaluate abdominal pain, right shoulder and rib pain, revealed a new 3.6 by 2.2 cm predominantly solid mass in the left upper lobe with associated mild pleural retraction. This mass was not present in the previous CT scan from 2017. The PET scan confirmed the presence of the mass, with a focus of central mild metabolic activity and an SUV max of 3.0.  The patient reports experiencing abdominal pain for the past two months, which has not been explained by any of the conducted tests. Accompanying the abdominal pain, the patient has had a cough for about two weeks and experiences nausea, particularly at night. The patient has not vomited but often feels the urge to do so. The patient denies any rib pain, despite the CT scan suggesting possible past rib fractures.  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 The patient has not been in the [employer] and has lived in [place] and [place]. The patient has a pet cat and denies any exposure to birds or chickens. There is no known exposure to water damage or black mold in the patient's living environment.  The patient has been experiencing decreased exercise tolerance, with a reduction in daily walking distance from three miles to about one mile. The patient has not been using his CPAP machine for the past two months due to discomfort with the mask  fit. The patient is not currently on any inhaler medications.   RADIOLOGY Chest CT (08/29/2023): 3.6 x 2.2 cm predominantly solid mass in the left upper lobe with flat and plate-like appearance on coronal imaging and mild pleural retraction. Calcified granulomata including a 14 mm lingular nodule with central calcification. Chest CT (05/01/2016): Calcified granulomata including a 14 mm lingular nodule with central calcification. PET scan (09/25/2023): Left upper lobe flattened mass-like consolidation without interval change from CT. Central mild metabolic activity with SUV max of 3.0, majority of lesion without significant metabolic activity.   Review of Systems As per HPI  Past Medical History:  Diagnosis Date   Arm fracture    left. Dx'd w/RSD post fracture   Arthritis    Carpal tunnel syndrome    Chest pain 2016   [facility] (Dr. Marcelle Overlie) doing stress echo, but feels like musculoskeletal chest wall pain is cause of his discomfort   Chronic back pain    COVID-19 06/2020   Deviated septum    Diverticulosis    GERD (gastroesophageal reflux disease)    Hyperlipidemia    Hypertension    Joint pain    Muscle pain    OSA on CPAP 07/04/2018   - Trial of CPAP therapy on 17 cm H2O with a Large size Fisher[employer]Paykel Full Face Mask Simplus mask and heated humidification.   Plantar fasciitis    Pulmonary nodule 04/2016   Lingula.  Pt chose to repeat CT chest w/out contrast in 3 mo---as per  radiologist's recommendations.   Right inguinal hernia    fat-containing.  Small hydrocele in left hemiscrotum.   Sleep apnea    Snoring    Swelling    of legs/feet/hands   Tobacco dependence in remission      Family History  Problem Relation Age of Onset   Lung cancer Mother    Lung cancer Father    Bone cancer Brother        bone   Heart disease Maternal Uncle    Colon cancer Neg Hx    Esophageal cancer Neg Hx    Stomach cancer Neg Hx      Social History   Socioeconomic  History   Marital status: Married    Spouse name: Not on file   Number of children: 2   Years of education: Not on file   Highest education level: 8th grade  Occupational History   Not on file  Tobacco Use   Smoking status: Former    Current packs/day: 0.00    Average packs/day: 3.0 packs/day for 30.0 years (90.0 ttl pk-yrs)    Types: Cigarettes    Start date: 07/16/1972    Quit date: 07/16/2002    Years since quitting: 21.2   Smokeless tobacco: Never  Vaping Use   Vaping status: Never Used  Substance and Sexual Activity   Alcohol use: Yes    Alcohol/week: 1.0 - 2.0 standard drink of alcohol    Types: 1 - 2 Cans of beer per week    Comment: socially   Drug use: No   Sexual activity: Yes  Other Topics Concern   Not on file  Social History Narrative   Married, Natalia Leatherwood. Children (2) Adult, 4 grandchildren.    9 th grade, Retired.   Wears seatbelt   Smoke detector in the home.    Firearms locked in the home.    Feels safe in his relationships.    Social Drivers of Corporate investment banker Strain: Low Risk  (06/19/2023)   Overall Financial Resource Strain (CARDIA)    Difficulty of Paying Living Expenses: Not hard at all  Food Insecurity: No Food Insecurity (06/19/2023)   Hunger Vital Sign    Worried About Running Out of Food in the Last Year: Never true    Ran Out of Food in the Last Year: Never true  Transportation Needs: No Transportation Needs (06/19/2023)   PRAPARE - Administrator, Civil Service (Medical): No    Lack of Transportation (Non-Medical): No  Physical Activity: Sufficiently Active (06/19/2023)   Exercise Vital Sign    Days of Exercise per Week: 7 days    Minutes of Exercise per Session: 70 min  Stress: No Stress Concern Present (06/19/2023)   [employer] of Occupational Health - Occupational Stress Questionnaire    Feeling of Stress : Not at all  Social Connections: Moderately Isolated (06/19/2023)   Social Connection and Isolation  Panel [NHANES]    Frequency of Communication with Friends and Family: Three times a week    Frequency of Social Gatherings with Friends and Family: Once a week    Attends Religious Services: Never    Database administrator or Organizations: No    Attends Banker Meetings: Never    Marital Status: Married  Catering manager Violence: Not At Risk (06/19/2023)   Humiliation, Afraid, Rape, and Kick questionnaire    Fear of Current or Ex-Partner: No    Emotionally Abused: No    Physically  Abused: No    Sexually Abused: No    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 The patient has not been in the [employer] and has lived in [place] and [place]. The patient has a pet cat and denies any exposure to birds or chickens. There is no known exposure to water damage or black mold in the patient's living environment.  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 90 tablet 1   aspirin 81 MG tablet Take 81 mg by mouth daily.     baclofen (LIORESAL) 20 MG tablet Take 0.5-1 tablets (10-20 mg total) by mouth 3 (three) times daily. 270 tablet 1   escitalopram (LEXAPRO) 20 MG tablet Take 1 tablet (20 mg total) by mouth daily. 90 tablet 1   famotidine (PEPCID) 20 MG tablet Take 1 tablet (20 mg total) by mouth 2 (two) times daily. 180 tablet 1   gabapentin (NEURONTIN) 600 MG tablet Take 1 tablet (600 mg total) by mouth 3 (three) times daily. 1 tab morning, 1.5 tabs afternoon, 1 tab evening,  doses should be every 8 hours. 270 tablet 1   hydrochlorothiazide  (HYDRODIURIL) 25 MG tablet Take 1 tablet (25 mg total) by mouth daily. 90 tablet 1   lisinopril (ZESTRIL) 10 MG tablet Take 1 tablet (10 mg total) by mouth daily. 90 tablet 1   lubiprostone (AMITIZA) 24 MCG capsule Take 1 capsule (24 mcg total) by mouth 2 (two) times daily with a meal. 60 capsule 5   MegaRed Omega-3 Krill Oil 500 MG CAPS Take 2 capsules by mouth daily.      MYRBETRIQ 50 MG TB24 tablet Take 50 mg by mouth daily.     oxyCODONE-acetaminophen (PERCOCET) 10-325 MG tablet Take 1 tablet by mouth every 8 (eight) hours as needed for pain. 90 tablet 0   simvastatin (ZOCOR) 10 MG tablet Take 1 tablet (10 mg total) by mouth every other day. 45 tablet 1   No facility-administered medications prior to visit.        Objective:   Physical Exam  Vitals:   10/10/23 1608  BP: (!) 151/88  Pulse: 81  SpO2: 98%  Weight: 196 lb 9.6 oz (89.2 kg)  Height: 6' (1.829 m)   Gen: Pleasant, well-nourished, in no distress,  normal affect  ENT: No lesions,  mouth clear,  oropharynx clear, no postnasal drip  Neck: No JVD, no stridor  Lungs: No use of accessory muscles, distant, no crackles or wheezing on normal respiration, no wheeze on forced expiration  Cardiovascular: RRR, heart sounds normal, no murmur or gallops, no peripheral edema  Musculoskeletal: No deformities, no cyanosis or clubbing  Neuro: alert, awake, non focal  Skin: Warm, no lesions or rash     Assessment & Plan:  Mass of upper lobe of left lung Irregular mixed density left upper lobe mass new compared with 2017.  Very suspicious for primary malignancy, probably adenocarcinoma.  No evidence of distant disease on this PET scan.  Reviewed all of this with him.  I recommended a navigational bronchoscopy to get a tissue diagnosis and he agrees.  We will try to get this set up for 10/28/2023.  Unclear whether he would be a surgical candidate but pulmonary function testing has been ordered.  I will try to get it moved up to be  done sooner.  Left Upper Lobe Mass New 3.6 x 2.2 cm predominantly solid mass in the left upper lobe with associated mild pleural retraction. Mild activity on PET scan. Differential includes adenocarcinoma vs. inflammatory focus. No evidence of local spread on imaging. -Plan bronchoscopy to obtain biopsy and culture. -Stop aspirin 2 days prior to bronchoscopy.   OSA on CPAP Obstructive Sleep Apnea Not using CPAP machine for the past 2 months. -Encourage resumption of CPAP use.  COPD (chronic obstructive pulmonary disease) (HCC) Suspected COPD, heavy tobacco history.  Minimizes his symptoms but he does state that his functional capacity is decreased.  He also has new cough New onset cough for 1 month with white sputum. No hemoptysis. Possible COPD given history of smoking. -Start trial of Stiolto for suspected COPD.  Abdominal pain Abdominal Pain Ongoing for 2 months. Associated with nausea but no vomiting. No clear etiology on imaging. -Continue workup with appropriate specialist.   Levy Pupa, MD, PhD 10/10/2023, 5:09 PM [facility] [phone] or if no answer before 7:00PM call [phone] For any issues after 7:00PM please call eLink [phone]

## 2023-10-10 NOTE — Patient Instructions (Signed)
We reviewed your CT scan of the chest and your PET scan today. We will arrange robotic assisted navigational bronchoscopy to evaluate a left upper lobe opacity that was seen on your imaging.  This will be done under general anesthesia as an outpatient at [facility] endoscopy.  You will need to stop your aspirin 2 days prior.  You will need a designated driver and someone to watch you at home that day after the procedure.  We will try to get this scheduled for 10/28/2023. We will do a trial of Stiolto.  Please try starting 2 puffs once daily.  Keep track of how this medication helps you so we can decide whether to continue it going forward. We will perform pulmonary function testing

## 2023-10-10 NOTE — Assessment & Plan Note (Signed)
Abdominal Pain Ongoing for 2 months. Associated with nausea but no vomiting. No clear etiology on imaging. -Continue workup with appropriate specialist.

## 2023-10-10 NOTE — Assessment & Plan Note (Signed)
Suspected COPD, heavy tobacco history.  Minimizes his symptoms but he does state that his functional capacity is decreased.  He also has new cough New onset cough for 1 month with white sputum. No hemoptysis. Possible COPD given history of smoking. -Start trial of Stiolto for suspected COPD.

## 2023-10-11 ENCOUNTER — Encounter: Payer: Self-pay | Admitting: Emergency Medicine

## 2023-10-14 ENCOUNTER — Ambulatory Visit: Payer: Self-pay | Admitting: Family Medicine

## 2023-10-14 NOTE — Telephone Encounter (Signed)
Copied from CRM [id number]. Topic: Clinical - Prescription Issue >> Oct 14, 2023 12:35 PM Irine Seal wrote: Reason for CRM:  amitza 60 tablets- Rx is backorder and pharmacy does not know when it will be in, patient is wanting a callback to discuss an alternative medication or what he needs to do   callback [phone]

## 2023-10-16 NOTE — Telephone Encounter (Signed)
Spoke with patient regarding results/recommendations.

## 2023-10-16 NOTE — Telephone Encounter (Signed)
Please call patient: I recommend patient call different pharmacies to see if the medication is in stock, then have his prescription transferred to that pharmacy.  There are really only 2 medications of this class, and the other is not on his formulary and would be very expensive.

## 2023-10-25 ENCOUNTER — Other Ambulatory Visit: Payer: Self-pay

## 2023-10-25 ENCOUNTER — Encounter (HOSPITAL_COMMUNITY): Payer: Self-pay | Admitting: Emergency Medicine

## 2023-10-25 NOTE — Progress Notes (Signed)
 PCP - Charlies Bellini, MD  EKG - DOS  CPAP - Unable to wear d/t mask not fitting well  Aspirin  Instructions: Stop two days prior to procedure. Last dose 10/24/23  Anesthesia review: N  Patient verbally denies any shortness of breath, fever, cough and chest pain during phone call   -------------  SDW INSTRUCTIONS given:  Your procedure is scheduled on Monday, February 10th.  Report to [facility] Main Entrance A at 1000 A.M., and check in at the Admitting office.  Call this number if you have problems the morning of surgery:  [phone]   Remember:  Do not eat or drink after midnight the night before your surgery    Take these medicines the morning of surgery with A SIP OF WATER  amLODipine  (NORVASC )  baclofen  (LIORESAL )  escitalopram  (LEXAPRO )  oxyCODONE -acetaminophen  (PERCOCET) famotidine  (PEPCID )-if needed gabapentin  (NEURONTIN )-if needed   As of today, STOP taking any Aspirin  (unless otherwise instructed by your surgeon) Aleve , Naproxen , Ibuprofen , Motrin , Advil , Goody's, BC's, all herbal medications, fish oil, and all vitamins.                      Do not wear jewelry, make up, or nail polish            Do not wear lotions, powders, perfumes/colognes, or deodorant.            Do not shave 48 hours prior to surgery.  Men may shave face and neck.            Do not bring valuables to the hospital.            [facility] is not responsible for any belongings or valuables.  Do NOT Smoke (Tobacco/Vaping) 24 hours prior to your procedure If you use a CPAP at night, you may bring all equipment for your overnight stay.   Contacts, glasses, dentures or bridgework may not be worn into surgery.      For patients admitted to the hospital, discharge time will be determined by your treatment team.   Patients discharged the day of surgery will not be allowed to drive home, and someone needs to stay with them for 24 hours.    Special instructions:   [place]- Preparing For  Surgery  Before surgery, you can play an important role. Because skin is not sterile, your skin needs to be as free of germs as possible. You can reduce the number of germs on your skin by washing with CHG (chlorahexidine gluconate) Soap before surgery.  CHG is an antiseptic cleaner which kills germs and bonds with the skin to continue killing germs even after washing.    Oral Hygiene is also important to reduce your risk of infection.  Remember - BRUSH YOUR TEETH THE MORNING OF SURGERY WITH YOUR REGULAR TOOTHPASTE  Please do not use if you have an allergy to CHG or antibacterial soaps. If your skin becomes reddened/irritated stop using the CHG.  Do not shave (including legs and underarms) for at least 48 hours prior to first CHG shower. It is OK to shave your face.  Please follow these instructions carefully.   Shower the NIGHT BEFORE SURGERY and the MORNING OF SURGERY with DIAL Soap.   Pat yourself dry with a CLEAN TOWEL.  Wear CLEAN PAJAMAS to bed the night before surgery  Place CLEAN SHEETS on your bed the night of your first shower and DO NOT SLEEP WITH PETS.   Day of Surgery: Please shower morning  of surgery  Wear Clean/Comfortable clothing the morning of surgery Do not apply any deodorants/lotions.   Remember to brush your teeth WITH YOUR REGULAR TOOTHPASTE.   Questions were answered. Patient verbalized understanding of instructions.

## 2023-10-28 ENCOUNTER — Encounter (HOSPITAL_COMMUNITY)
Admission: RE | Disposition: A | Discharge status: Home / Self Care | Payer: Self-pay | Source: Home / Self Care | Attending: Emergency Medicine

## 2023-10-28 ENCOUNTER — Ambulatory Visit (HOSPITAL_COMMUNITY): Payer: PPO

## 2023-10-28 ENCOUNTER — Ambulatory Visit (HOSPITAL_COMMUNITY)
Admission: RE | Admit: 2023-10-28 | Discharge: 2023-10-28 | Disposition: A | Discharge status: Home / Self Care | Payer: PPO | Attending: Emergency Medicine | Admitting: Emergency Medicine

## 2023-10-28 ENCOUNTER — Encounter (HOSPITAL_COMMUNITY): Payer: Self-pay | Admitting: Emergency Medicine

## 2023-10-28 ENCOUNTER — Ambulatory Visit (HOSPITAL_BASED_OUTPATIENT_CLINIC_OR_DEPARTMENT_OTHER): Payer: PPO | Admitting: Anesthesiology

## 2023-10-28 ENCOUNTER — Ambulatory Visit (HOSPITAL_COMMUNITY): Payer: PPO | Admitting: Anesthesiology

## 2023-10-28 DIAGNOSIS — Z808 Family history of malignant neoplasm of other organs or systems: Secondary | ICD-10-CM | POA: Insufficient documentation

## 2023-10-28 DIAGNOSIS — C3412 Malignant neoplasm of upper lobe, left bronchus or lung: Secondary | ICD-10-CM | POA: Diagnosis not present

## 2023-10-28 DIAGNOSIS — J449 Chronic obstructive pulmonary disease, unspecified: Secondary | ICD-10-CM | POA: Insufficient documentation

## 2023-10-28 DIAGNOSIS — Z9889 Other specified postprocedural states: Secondary | ICD-10-CM

## 2023-10-28 DIAGNOSIS — G4733 Obstructive sleep apnea (adult) (pediatric): Secondary | ICD-10-CM

## 2023-10-28 DIAGNOSIS — I700 Atherosclerosis of aorta: Secondary | ICD-10-CM | POA: Diagnosis not present

## 2023-10-28 DIAGNOSIS — Z48813 Encounter for surgical aftercare following surgery on the respiratory system: Secondary | ICD-10-CM | POA: Diagnosis not present

## 2023-10-28 DIAGNOSIS — I10 Essential (primary) hypertension: Secondary | ICD-10-CM | POA: Diagnosis not present

## 2023-10-28 DIAGNOSIS — K219 Gastro-esophageal reflux disease without esophagitis: Secondary | ICD-10-CM | POA: Insufficient documentation

## 2023-10-28 DIAGNOSIS — Z87891 Personal history of nicotine dependence: Secondary | ICD-10-CM | POA: Diagnosis not present

## 2023-10-28 DIAGNOSIS — R918 Other nonspecific abnormal finding of lung field: Secondary | ICD-10-CM | POA: Diagnosis not present

## 2023-10-28 DIAGNOSIS — Z801 Family history of malignant neoplasm of trachea, bronchus and lung: Secondary | ICD-10-CM | POA: Diagnosis not present

## 2023-10-28 DIAGNOSIS — J984 Other disorders of lung: Secondary | ICD-10-CM | POA: Diagnosis not present

## 2023-10-28 HISTORY — PX: BRONCHIAL BIOPSY: SHX5109

## 2023-10-28 HISTORY — PX: BRONCHIAL WASHINGS: SHX5105

## 2023-10-28 HISTORY — PX: BRONCHIAL BRUSHINGS: SHX5108

## 2023-10-28 HISTORY — PX: BRONCHIAL NEEDLE ASPIRATION BIOPSY: SHX5106

## 2023-10-28 LAB — BASIC METABOLIC PANEL
Anion gap: 9 (ref 5–15)
BUN: 10 mg/dL (ref 8–23)
CO2: 24 mmol/L (ref 22–32)
Calcium: 9.2 mg/dL (ref 8.9–10.3)
Chloride: 107 mmol/L (ref 98–111)
Creatinine, Ser: 0.82 mg/dL (ref 0.61–1.24)
GFR, Estimated: >60 mL/min (ref >60)
Glucose, Bld: 101 mg/dL — ABNORMAL HIGH (ref 70–99)
Potassium: 4 mmol/L (ref 3.5–5.1)
Sodium: 140 mmol/L (ref 135–145)

## 2023-10-28 LAB — CBC
HCT: 44 % (ref 39.0–52.0)
Hemoglobin: 14.5 g/dL (ref 13.0–17.0)
MCH: 30.6 pg (ref 26.0–34.0)
MCHC: 33 g/dL (ref 30.0–36.0)
MCV: 92.8 fL (ref 80.0–100.0)
Platelets: 142 10*3/uL — ABNORMAL LOW (ref 150–400)
RBC: 4.74 MIL/uL (ref 4.22–5.81)
RDW: 13.4 % (ref 11.5–15.5)
WBC: 4.2 10*3/uL (ref 4.0–10.5)
nRBC: 0 % (ref 0.0–0.2)

## 2023-10-28 SURGERY — BRONCHOSCOPY, WITH BIOPSY USING ELECTROMAGNETIC NAVIGATION
Anesthesia: General

## 2023-10-28 MED ORDER — LACTATED RINGERS IV SOLN: Freq: Once | INTRAVENOUS | Status: AC

## 2023-10-28 MED ORDER — FENTANYL CITRATE (PF) 250 MCG/5ML IJ SOLN
INTRAMUSCULAR | Status: DC | PRN
  Administered 2023-10-28: 100 ug via INTRAVENOUS

## 2023-10-28 MED ORDER — ACETAMINOPHEN 500 MG PO TABS
ORAL_TABLET | ORAL | Status: AC
  Administered 2023-10-28: 1000 mg via ORAL
  Filled 2023-10-28: qty 2

## 2023-10-28 MED ORDER — GABAPENTIN 600 MG PO TABS
600.0000 mg | ORAL_TABLET | Freq: Three times a day (TID) | ORAL | Status: DC
Start: 2023-10-28 — End: 2023-11-21

## 2023-10-28 MED ORDER — EPHEDRINE SULFATE-NACL 50-0.9 MG/10ML-% IV SOSY
PREFILLED_SYRINGE | INTRAVENOUS | Status: DC | PRN
Start: 2023-10-28 — End: 2023-10-28
  Administered 2023-10-28 (×2): 5 mg via INTRAVENOUS

## 2023-10-28 MED ORDER — LIDOCAINE 2% (20 MG/ML) 5 ML SYRINGE
INTRAMUSCULAR | Status: DC | PRN
  Administered 2023-10-28: 60 mg via INTRAVENOUS

## 2023-10-28 MED ORDER — ROCURONIUM BROMIDE 10 MG/ML (PF) SYRINGE
PREFILLED_SYRINGE | INTRAVENOUS | Status: DC | PRN
  Administered 2023-10-28 (×2): 50 mg via INTRAVENOUS

## 2023-10-28 MED ORDER — ACETAMINOPHEN 500 MG PO TABS: 1000.0000 mg | ORAL_TABLET | ORAL | Status: AC

## 2023-10-28 MED ORDER — PHENYLEPHRINE 80 MCG/ML (10ML) SYRINGE FOR IV PUSH (FOR BLOOD PRESSURE SUPPORT)
PREFILLED_SYRINGE | INTRAVENOUS | Status: DC | PRN
Start: 2023-10-28 — End: 2023-10-28
  Administered 2023-10-28 (×3): 160 ug via INTRAVENOUS

## 2023-10-28 MED ORDER — CHLORHEXIDINE GLUCONATE 0.12 % MT SOLN
15.0000 mL | OROMUCOSAL | Status: AC
  Administered 2023-10-28: 15 mL via OROMUCOSAL
  Filled 2023-10-28: qty 15

## 2023-10-28 MED ORDER — PROPOFOL 10 MG/ML IV BOLUS
INTRAVENOUS | Status: DC | PRN
  Administered 2023-10-28: 120 mg via INTRAVENOUS

## 2023-10-28 MED ORDER — FAMOTIDINE 20 MG PO TABS: 20.0000 mg | ORAL_TABLET | Freq: Two times a day (BID) | ORAL | Status: DC

## 2023-10-28 MED ORDER — SUGAMMADEX SODIUM 200 MG/2ML IV SOLN
INTRAVENOUS | Status: DC | PRN
  Administered 2023-10-28: 200 mg via INTRAVENOUS

## 2023-10-28 MED ORDER — PROPOFOL 500 MG/50ML IV EMUL
INTRAVENOUS | Status: DC | PRN
  Administered 2023-10-28: 125 ug/kg/min via INTRAVENOUS

## 2023-10-28 MED ORDER — ONDANSETRON HCL 4 MG/2ML IJ SOLN
INTRAMUSCULAR | Status: DC | PRN
  Administered 2023-10-28: 4 mg via INTRAVENOUS

## 2023-10-28 MED ORDER — SODIUM CHLORIDE 0.9 % IV SOLN: INTRAVENOUS | Status: DC | PRN

## 2023-10-28 MED ORDER — SUCCINYLCHOLINE CHLORIDE 200 MG/10ML IV SOSY
PREFILLED_SYRINGE | INTRAVENOUS | Status: DC | PRN
  Administered 2023-10-28: 120 mg via INTRAVENOUS

## 2023-10-28 NOTE — Interval H&P Note (Signed)
 History and Physical Interval Note:  10/28/2023 11:05 AM  Nicholas Graham  has presented today for surgery, with the diagnosis of LEFT UPPER LOPE MASS.  The various methods of treatment have been discussed with the patient and family. After consideration of risks, benefits and other options for treatment, the patient has consented to  Procedure(s): ROBOTIC ASSISTED NAVIGATIONAL BRONCHOSCOPY (N/A) as a surgical intervention.  The patient's history has been reviewed, patient examined, no change in status, stable for surgery.  I have reviewed the patient's chart and labs.  Questions were answered to the patient's satisfaction.     Denson Flake

## 2023-10-28 NOTE — Op Note (Signed)
 Video Bronchoscopy with Robotic Assisted Bronchoscopic Navigation   Date of Operation: 10/28/2023   Pre-op Diagnosis: Left upper lobe mass  Post-op Diagnosis: Same  Surgeon: Racheal Buddle  Assistants: None  Anesthesia: General endotracheal anesthesia  Operation: Flexible video fiberoptic bronchoscopy with robotic assistance and biopsies.  Estimated Blood Loss: Minimal  Complications: None  Indications and History: Nicholas Graham is a 73 y.o. male with history of tobacco use found to have a mixed density irregular left upper lobe mass on CT chest.  Recommendation made to achieve a tissue diagnosis via robotic assisted navigational bronchoscopy. The risks, benefits, complications, treatment options and expected outcomes were discussed with the patient.  The possibilities of pneumothorax, pneumonia, reaction to medication, pulmonary aspiration, perforation of a viscus, bleeding, failure to diagnose a condition and creating a complication requiring transfusion or operation were discussed with the patient who freely signed the consent.    Description of Procedure: The patient was seen in the Preoperative Area, was examined and was deemed appropriate to proceed.  The patient was taken to [facility] endoscopy room 3, identified as Nicholas Graham and the procedure verified as Flexible Video Fiberoptic Bronchoscopy.  A Time Out was held and the above information confirmed.   Prior to the date of the procedure a high-resolution CT scan of the chest was performed. Utilizing ION software program a virtual tracheobronchial tree was generated to allow the creation of distinct navigation pathways to the patient's parenchymal abnormalities. After being taken to the operating room general anesthesia was initiated and the patient  was orally intubated. The video fiberoptic bronchoscope was introduced via the endotracheal tube and a general inspection was performed which showed normal right and left lung anatomy.  Aspiration of the bilateral mainstems was completed to remove any remaining secretions. Robotic catheter inserted into patient's endotracheal tube.   Target #1 left upper lobe mass: The distinct navigation pathways prepared prior to this procedure were then utilized to navigate to patient's lesion identified on CT scan. The robotic catheter was secured into place and the vision probe was withdrawn.  Lesion location was approximated using fluoroscopy.  Local registration and targeting was performed using Cios three-dimensional imaging. Under fluoroscopic guidance transbronchial needle brushings, transbronchial needle biopsies, and transbronchial forceps biopsies were performed to be sent for cytology and pathology. A bronchioalveolar lavage was performed in the left upper lobe and sent for microbiology.  At the end of the procedure a general airway inspection was performed and there was no evidence of active bleeding. The bronchoscope was removed.  The patient tolerated the procedure well. There was no significant blood loss and there were no obvious complications. A post-procedural chest x-ray is pending.  Samples Target #1: 1. Transbronchial needle brushings from left upper lobe mass 2. Transbronchial Wang needle biopsies from left upper lobe mass 3. Transbronchial forceps biopsies from left upper lobe mass 4. Bronchoalveolar lavage from left upper lobe   Plans:  The patient will be discharged from the PACU to home when recovered from anesthesia and after chest x-ray is reviewed. We will review the cytology, pathology and microbiology results with the patient when they become available. Outpatient followup will be with Dr. Baldwin Levee or S. Margit Shelling, NP.   Racheal Buddle, MD, PhD 10/28/2023, 1:59 PM [facility] [phone] or if no answer before 7:00PM call [phone] For any issues after 7:00PM please call eLink [phone]

## 2023-10-28 NOTE — Anesthesia Procedure Notes (Signed)
 Procedure Name: Intubation Date/Time: 10/28/2023 12:39 PM  Performed by: Katrinka Parr, CRNAPre-anesthesia Checklist: Patient identified, Emergency Drugs available, Suction available and Patient being monitored Patient Re-evaluated:Patient Re-evaluated prior to induction Oxygen Delivery Method: Circle System Utilized Preoxygenation: Pre-oxygenation with 100% oxygen Induction Type: IV induction Ventilation: Mask ventilation without difficulty Laryngoscope Size: Mac and 4 Grade View: Grade II Tube type: Oral Tube size: 8.5 mm Number of attempts: 1 Airway Equipment and Method: Stylet and Oral airway Placement Confirmation: ETT inserted through vocal cords under direct vision, positive ETCO2 and breath sounds checked- equal and bilateral Secured at: 23 cm Tube secured with: Tape Dental Injury: Teeth and Oropharynx as per pre-operative assessment  Comments: Difficult mask anticipated due to large beard -- Succinylcholine  used for induction

## 2023-10-28 NOTE — Discharge Instructions (Signed)
 Flexible Bronchoscopy, Care After This sheet gives you information about how to care for yourself after your test. Your doctor may also give you more specific instructions. If you have problems or questions, contact your doctor. Follow these instructions at home: Eating and drinking When your numbness is gone and your cough and gag reflexes have come back, you may: Eat only soft foods. Slowly drink liquids. When you get home after the test, go back to your normal diet. Driving Do not drive for 24 hours if you were given a medicine to help you relax (sedative). Do not drive or use heavy machinery while taking prescription pain medicine. General instructions  Take over-the-counter and prescription medicines only as told by your doctor. Return to your normal activities as told. Ask what activities are safe for you. Do not use any products that have nicotine or tobacco in them. This includes cigarettes and e-cigarettes. If you need help quitting, ask your doctor. Keep all follow-up visits as told by your doctor. This is important. It is very important if you had a tissue sample (biopsy) taken. Get help right away if: You have shortness of breath that gets worse. You get light-headed. You feel like you are going to pass out (faint). You have chest pain. You cough up: More than a little blood. More blood than before. Summary Do not eat or drink anything (not even water) for 2 hours after your test, or until your numbing medicine wears off. Do not use cigarettes. Do not use e-cigarettes. Get help right away if you have chest pain.  Please call our office for any questions or concerns.  [phone].  This information is not intended to replace advice given to you by your health care provider. Make sure you discuss any questions you have with your health care provider. Document Released: 07/01/2009 Document Revised: 08/16/2017 Document Reviewed: 09/21/2016 Elsevier Patient Education  2020  [employer].

## 2023-10-28 NOTE — Anesthesia Postprocedure Evaluation (Signed)
 Anesthesia Post Note  Patient: Nicholas Graham  Procedure(s) Performed: ROBOTIC ASSISTED NAVIGATIONAL BRONCHOSCOPY BRONCHIAL BRUSHINGS BRONCHIAL NEEDLE ASPIRATION BIOPSIES BRONCHIAL BIOPSIES BRONCHIAL WASHINGS     Patient location during evaluation: PACU Anesthesia Type: General Level of consciousness: awake and alert Pain management: pain level controlled Vital Signs Assessment: post-procedure vital signs reviewed and stable Respiratory status: spontaneous breathing, nonlabored ventilation and respiratory function stable Cardiovascular status: blood pressure returned to baseline and stable Postop Assessment: no apparent nausea or vomiting Anesthetic complications: no  No notable events documented.  Last Vitals:  Vitals:   10/28/23 1430 10/28/23 1440  BP: 100/73 103/62  Pulse: 80 80  Resp: (!) 23 13  Temp:    SpO2: 93% 96%    Last Pain:  Vitals:   10/28/23 1440  TempSrc:   PainSc: 0-No pain                 Neill Jurewicz,W. EDMOND

## 2023-10-28 NOTE — Anesthesia Preprocedure Evaluation (Addendum)
 Anesthesia Evaluation  Patient identified by MRN, date of birth, ID band Patient awake    Reviewed: Allergy & Precautions, H&P , NPO status , Patient's Chart, lab work & pertinent test results  Airway Mallampati: III  TM Distance: >3 FB Neck ROM: Full    Dental no notable dental hx. (+) Teeth Intact, Dental Advisory Given   Pulmonary sleep apnea and Continuous Positive Airway Pressure Ventilation , COPD, former smoker   Pulmonary exam normal breath sounds clear to auscultation       Cardiovascular hypertension, Pt. on medications  Rhythm:Regular Rate:Normal     Neuro/Psych   Anxiety     negative neurological ROS     GI/Hepatic Neg liver ROS,GERD  Medicated,,  Endo/Other  negative endocrine ROS    Renal/GU negative Renal ROS  negative genitourinary   Musculoskeletal  (+) Arthritis , Osteoarthritis,    Abdominal   Peds  Hematology negative hematology ROS (+)   Anesthesia Other Findings   Reproductive/Obstetrics negative OB ROS                             Anesthesia Physical Anesthesia Plan  ASA: 3  Anesthesia Plan: General   Post-op Pain Management: Tylenol  PO (pre-op)*   Induction: Intravenous  PONV Risk Score and Plan: 3 and Ondansetron , Dexamethasone  and Treatment may vary due to age or medical condition  Airway Management Planned: Oral ETT  Additional Equipment:   Intra-op Plan:   Post-operative Plan: Extubation in OR  Informed Consent: I have reviewed the patients History and Physical, chart, labs and discussed the procedure including the risks, benefits and alternatives for the proposed anesthesia with the patient or authorized representative who has indicated his/her understanding and acceptance.     Dental advisory given  Plan Discussed with: CRNA  Anesthesia Plan Comments:        Anesthesia Quick Evaluation

## 2023-10-28 NOTE — Transfer of Care (Signed)
 Immediate Anesthesia Transfer of Care Note  Patient: Nicholas Graham  Procedure(s) Performed: ROBOTIC ASSISTED NAVIGATIONAL BRONCHOSCOPY BRONCHIAL BRUSHINGS BRONCHIAL NEEDLE ASPIRATION BIOPSIES BRONCHIAL BIOPSIES BRONCHIAL WASHINGS  Patient Location: Endoscopy Unit  Anesthesia Type:General  Level of Consciousness: awake, drowsy, and patient cooperative  Airway & Oxygen Therapy: Patient Spontanous Breathing and Patient connected to nasal cannula oxygen  Post-op Assessment: Report given to RN and Post -op Vital signs reviewed and stable  Post vital signs: Reviewed and stable  Last Vitals:  Vitals Value Taken Time  BP 99/62 10/28/23 1400  Temp    Pulse 86 10/28/23 1405  Resp 16 10/28/23 1405  SpO2 98 % 10/28/23 1405  Vitals shown include unfiled device data.  Last Pain:  Vitals:   10/28/23 1016  TempSrc:   PainSc: 0-No pain      Patients Stated Pain Goal: 0 (10/28/23 1016)  Complications: No notable events documented.

## 2023-10-29 ENCOUNTER — Encounter (HOSPITAL_COMMUNITY): Payer: Self-pay | Admitting: Emergency Medicine

## 2023-10-29 LAB — CYTOLOGY - NON PAP

## 2023-10-30 LAB — ACID FAST SMEAR (AFB, MYCOBACTERIA): Acid Fast Smear: NEGATIVE

## 2023-10-30 LAB — CULTURE, BAL-QUANTITATIVE W GRAM STAIN

## 2023-11-02 LAB — ANAEROBIC CULTURE W GRAM STAIN

## 2023-11-04 ENCOUNTER — Encounter: Payer: Self-pay | Admitting: Acute Care

## 2023-11-04 ENCOUNTER — Ambulatory Visit: Payer: PPO | Admitting: Acute Care

## 2023-11-04 VITALS — BP 115/69 | HR 67 | Ht 74.0 in | Wt 195.8 lb

## 2023-11-04 DIAGNOSIS — C3412 Malignant neoplasm of upper lobe, left bronchus or lung: Secondary | ICD-10-CM | POA: Diagnosis not present

## 2023-11-04 DIAGNOSIS — Z9889 Other specified postprocedural states: Secondary | ICD-10-CM | POA: Diagnosis not present

## 2023-11-04 DIAGNOSIS — R911 Solitary pulmonary nodule: Secondary | ICD-10-CM | POA: Diagnosis not present

## 2023-11-04 DIAGNOSIS — J449 Chronic obstructive pulmonary disease, unspecified: Secondary | ICD-10-CM

## 2023-11-04 DIAGNOSIS — Z87891 Personal history of nicotine dependence: Secondary | ICD-10-CM

## 2023-11-04 NOTE — Progress Notes (Signed)
Radiation Oncology         [phone] ________________________________  Name: Nicholas Graham        MRN: [id number]  Date of Service: 11/06/2023 DOB: 1951/06/20  WU:[id number], Ezequiel Essex, DO  Byrum, Les Pou, MD     REFERRING PHYSICIAN: Leslye Peer, MD   DIAGNOSIS: The encounter diagnosis was Malignant neoplasm of upper lobe of left lung (HCC).   HISTORY OF PRESENT ILLNESS: Nicholas Graham is a 73 y.o. male seen at the request of Dr. Delton Coombes for a newly diagnosed lung cancer. the patient was noted to have a pulmonary nodule in 2017 in the lingula of the left upper lobe measuring 1.3 cm and the indication at that time reads that there had been a suspected nodule near the heart.  It is not clear what happened between then and now but recent CT of the chest without contrast was performed on 08/29/2023 due to right shoulder and rib pain.  This identified a mass in the left upper lobe measuring 3.6 cm.  There was associated mild pleural retraction and calcified lingular nodule measuring 14 mm consistent with the previously noted lung nodule overall felt to be benign.  He was referred to pulmonary medicine and a PET scan on 09/25/2023 was performed showing a platelike mass density in the left upper lobe with metabolic activity measuring 3.9 cm with an SUV centrally of 3, though it is of the lesion did not have significant diffuse metabolic activity.  He underwent bronchoscopy on 10/28/2023, and final cytology fine-needle aspirate showed adenocarcinoma and brushings were negative for malignancy.  Given that there is no evidence of metastatic disease, he has pulmonary function testing ordered for 11/15/2023, and to meet with cardiothoracic surgery.  He is seen to discuss radiation as an alternative if he is not a surgical candidate.    PREVIOUS RADIATION THERAPY: {EXAM; YES/NO:19492::"No"}   PAST MEDICAL HISTORY:  Past Medical History:  Diagnosis Date   Arm fracture    left. Dx'd w/RSD post fracture    Arthritis    Carpal tunnel syndrome    Chest pain 2016   [facility] (Dr. Marcelle Overlie) doing stress echo, but feels like musculoskeletal chest wall pain is cause of his discomfort   Chronic back pain    COVID-19 06/2020   Deviated septum    Diverticulosis    GERD (gastroesophageal reflux disease)    Hyperlipidemia    Hypertension    Joint pain    Muscle pain    OSA on CPAP 07/04/2018   - Trial of CPAP therapy on 17 cm H2O with a Large size [employer]Paykel Full Face Mask Simplus mask and heated humidification.   Plantar fasciitis    Pulmonary nodule 04/2016   Lingula.  Pt chose to repeat CT chest w/out contrast in 3 mo---as per radiologist's recommendations.   Right inguinal hernia    fat-containing.  Small hydrocele in left hemiscrotum.   Sleep apnea    Snoring    Swelling    of legs/feet/hands   Tobacco dependence in remission        PAST SURGICAL HISTORY: Past Surgical History:  Procedure Laterality Date   BRONCHIAL BIOPSY  10/28/2023   Procedure: BRONCHIAL BIOPSIES;  Surgeon: Leslye Peer, MD;  Location: [facility] ENDOSCOPY;  Service: Pulmonary;;   BRONCHIAL BRUSHINGS  10/28/2023   Procedure: BRONCHIAL BRUSHINGS;  Surgeon: Leslye Peer, MD;  Location: [facility] ENDOSCOPY;  Service: Pulmonary;;   BRONCHIAL NEEDLE ASPIRATION BIOPSY  10/28/2023   Procedure:  BRONCHIAL NEEDLE ASPIRATION BIOPSIES;  Surgeon: Leslye Peer, MD;  Location: [facility] ENDOSCOPY;  Service: Pulmonary;;   BRONCHIAL WASHINGS  10/28/2023   Procedure: BRONCHIAL WASHINGS;  Surgeon: Leslye Peer, MD;  Location: [facility] ENDOSCOPY;  Service: Pulmonary;;   COLONOSCOPY     fracture repair left arm     had a fixator   INGUINAL HERNIA REPAIR Left 2013   repeat hernia repair. x2   IR RADIOLOGIST EVAL & MGMT  07/22/2023   KNEE ARTHROSCOPY Right    LUMBAR DISC SURGERY  1993   NEURECTOMY inguinal after hernia repair Left    PLANTAR FASCIA SURGERY Right 2000s   SHOULDER ARTHROSCOPY Right    UPPER GI ENDOSCOPY       FAMILY  HISTORY:  Family History  Problem Relation Age of Onset   Lung cancer Mother    Lung cancer Father    Bone cancer Brother        bone   Heart disease Maternal Uncle    Colon cancer Neg Hx    Esophageal cancer Neg Hx    Stomach cancer Neg Hx      SOCIAL HISTORY:  reports that he quit smoking about 21 years ago. His smoking use included cigarettes. He started smoking about 51 years ago. He has a 90 pack-year smoking history. He has never used smokeless tobacco. He reports current alcohol use of about 1.0 - 2.0 standard drink of alcohol per week. He reports that he does not use drugs.  The patient is married and lives in [place].   ALLERGIES: Penicillins, Celebrex [celecoxib], and Cymbalta [duloxetine hcl]   MEDICATIONS:  Current Outpatient Medications  Medication Sig Dispense Refill   amLODipine (NORVASC) 10 MG tablet Take 1 tablet (10 mg total) by mouth daily. 90 tablet 1   aspirin 81 MG tablet Take 81 mg by mouth daily.     baclofen (LIORESAL) 20 MG tablet Take 0.5-1 tablets (10-20 mg total) by mouth 3 (three) times daily. 270 tablet 1   escitalopram (LEXAPRO) 20 MG tablet Take 1 tablet (20 mg total) by mouth daily. 90 tablet 1   famotidine (PEPCID) 20 MG tablet Take 1 tablet (20 mg total) by mouth 2 (two) times daily. As needed per Pt     gabapentin (NEURONTIN) 600 MG tablet Take 1 tablet (600 mg total) by mouth 3 (three) times daily. 1 tab morning, 1.5 tabs afternoon, 1 tab evening,  doses should be every 8 hours.  Per Pt as needed     hydrochlorothiazide (HYDRODIURIL) 25 MG tablet Take 1 tablet (25 mg total) by mouth daily. 90 tablet 1   lisinopril (ZESTRIL) 10 MG tablet Take 1 tablet (10 mg total) by mouth daily. 90 tablet 1   MegaRed Omega-3 Krill Oil 500 MG CAPS Take 2 capsules by mouth daily.      MYRBETRIQ 50 MG TB24 tablet Take 50 mg by mouth daily.     oxyCODONE-acetaminophen (PERCOCET) 10-325 MG tablet Take 1 tablet by mouth every 8 (eight) hours as needed for pain. 90  tablet 0   simvastatin (ZOCOR) 10 MG tablet Take 1 tablet (10 mg total) by mouth every other day. 45 tablet 1   No current facility-administered medications for this visit.     REVIEW OF SYSTEMS: On review of systems, the patient reports that ***      PHYSICAL EXAM:  Wt Readings from Last 3 Encounters:  11/04/23 195 lb 12.8 oz (88.8 kg)  10/28/23 190 lb (86.2 kg)  10/10/23 196 lb 9.6 oz (89.2 kg)   Temp Readings from Last 3 Encounters:  10/28/23 97.8 F (36.6 C) (Temporal)  10/08/23 (!) 97.5 F (36.4 C)  09/05/23 98.5 F (36.9 C) (Oral)   BP Readings from Last 3 Encounters:  11/04/23 115/69  10/28/23 103/62  10/10/23 (!) 151/88   Pulse Readings from Last 3 Encounters:  11/04/23 67  10/28/23 80  10/10/23 81    /10  In general this is a well appearing Caucasian male in no acute distress.  He's alert and oriented x4 and appropriate throughout the examination. Cardiopulmonary assessment is negative for acute distress and he exhibits normal effort.     ECOG = ***  0 - Asymptomatic (Fully active, able to carry on all predisease activities without restriction)  1 - Symptomatic but completely ambulatory (Restricted in physically strenuous activity but ambulatory and able to carry out work of a light or sedentary nature. For example, light housework, office work)  2 - Symptomatic, <50% in bed during the day (Ambulatory and capable of all self care but unable to carry out any work activities. Up and about more than 50% of waking hours)  3 - Symptomatic, >50% in bed, but not bedbound (Capable of only limited self-care, confined to bed or chair 50% or more of waking hours)  4 - Bedbound (Completely disabled. Cannot carry on any self-care. Totally confined to bed or chair)  5 - Death   Santiago Glad MM, Creech RH, Tormey DC, et al. [phone]). "Toxicity and response criteria of the [facility]". Am. Evlyn Clines. Oncol. 5 (6): 649-55    LABORATORY DATA:  Lab  Results  Component Value Date   WBC 4.2 10/28/2023   HGB 14.5 10/28/2023   HCT 44.0 10/28/2023   MCV 92.8 10/28/2023   PLT 142 (L) 10/28/2023   Lab Results  Component Value Date   NA 140 10/28/2023   K 4.0 10/28/2023   CL 107 10/28/2023   CO2 24 10/28/2023   Lab Results  Component Value Date   ALT 15 09/17/2023   AST 20 09/17/2023   ALKPHOS 62 09/17/2023   BILITOT 0.9 09/17/2023      RADIOGRAPHY: DG Chest Port 1 View Result Date: 10/28/2023 CLINICAL DATA:  Status post bronchoscopy with biopsy. EXAM: PORTABLE CHEST 1 VIEW COMPARISON:  PET-CT 09/25/2023 and chest CT 08/29/2023 FINDINGS: Focal density in left upper lung compatible with the known lesion. Linear patchy densities in lower lungs probably represent areas of atelectasis. Negative for a pneumothorax. Heart size is normal. Atherosclerotic calcifications at the aortic arch. IMPRESSION: 1. No pneumothorax. 2. Focal density in the left upper lung compatible with the known lesion. Electronically Signed   By: Richarda Overlie M.D.   On: 10/28/2023 16:05   DG C-ARM BRONCHOSCOPY Result Date: 10/28/2023 C-ARM BRONCHOSCOPY: Fluoroscopy was utilized by the requesting physician.  No radiographic interpretation.   DG C-Arm 1-60 Min-No Report Result Date: 10/28/2023 Fluoroscopy was utilized by the requesting physician.  No radiographic interpretation.       IMPRESSION/PLAN: 1. Stage IB, [id number], NSCLC, adenocarcinoma of the LUL. Dr. Mitzi Hansen discusses the pathology findings and reviews the nature of early stage lung cancer. We reviewed the guidelines recommend brain imaging with MRI. He **  Dr. Mitzi Hansen reviews that the standard of care is for early lung caner is for surgical resection. However for patients who are not medical candidates to undergo surgery, or who choose to forgo surgery, stereotactic body radiotherapy (SBRT) is an appropriate alternative.  We discussed the risks, benefits, short, and long term effects of radiotherapy, as well as  the curative intent, and the patient is interested in proceeding***. Dr. Mitzi Hansen discusses the delivery and logistics of radiotherapy and anticipates a course of ***     In a visit lasting *** minutes, greater than 50% of the time was spent face to face discussing the patient's condition, in preparation for the discussion, and coordinating the patient's care.   The above documentation reflects my direct findings during this shared patient visit. Please see the separate note by Dr. Mitzi Hansen on this date for the remainder of the patient's plan of care.    Osker Mason, [facility]   **Disclaimer: This note was dictated with voice recognition software. Similar sounding words can inadvertently be transcribed and this note may contain transcription errors which may not have been corrected upon publication of note.**

## 2023-11-04 NOTE — Patient Instructions (Signed)
It is god to see you today. Your biopsy was positive for adenocarcinoma of the lung. This is a non small cell lung cancer.  I have referred you to both radiation oncology and thoracic surgery to be evaluated. You will get calls to get these appointments scheduled.  I will also get you scheduled today for PFT's, as surgery will need these to determine if you are a surgical candidate.  Follow up as needed.  Daisey Must with your treatment. The cancer center will take great care of you.  Please contact office for sooner follow up if symptoms do not improve or worsen or seek emergency care

## 2023-11-04 NOTE — Progress Notes (Signed)
History of Present Illness Nicholas Graham is a 73 y.o. male former smoker with a 90 pack-year history, who also has a history of  COPD,hypertension, obstructive sleep apnea, diverticulosis, gastroesophageal reflux disease, and chronic back pain. The patient has been under observation for a lingular nodule since 2017.  He is followed by Dr. Delton Graham.   Synopsis 73 year old male former smoker who has been followed for a lingular pulmonary nodule since 2017. Patient had a CT scan done to evaluate abdominal pain and right shoulder and rib pain.  This showed a 3.6 x 2.2 cm predominantly solid mass in the left upper lobe with some associated mild pleural retraction.  This mass was not present on the CT scan done in 2017.  PET scan was done which confirmed the presence of a mass with a focus of central mild hypermetabolic activity with an SUV max of 3.0. 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  Patient was seen by Dr. Delton Graham on 10/10/2023 and plan was to do bronchoscopy with biopsies and cultures to better evaluate the new solid left upper lobe mass.  He is here today to review the results of his cytology and to ensure he is done well postprocedure.     11/04/2023 Pt. Presents for follow up after bronchoscopy.Did ok after bronch with biopsies. He had bleeding for 3-4 days after his procedure, then restarted Thursday and Friday and Then stopped. He denies fever, no change in secretions, and no issues with anesthesia.   We have reviewed the results of the patient's biopsy. Cytology shows that the left upper lobe nodule of concern is an adenocarcinoma of the lung.  I explained to the patient and his wife that this is a non-small cell lung cancer.  Based on his PET scan results this appears to be a single solitary nodule and therefore patient will be referred to both radiation oncology and cardiovascular  thoracic surgery to be evaluated for treatment. Patient has not had PFTs therefore we have scheduled him November 15, 2023 for full set of PFTs.  Patient is also in agreement with referral to radiation oncology to be evaluated for SBRT.  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  He does wear a mask while he is working.  He is very interested in remaining active, this may impact which option he chooses for treatment.  Referrals have been made, either the patient or his wife had additional questions prior to the conclusion of the appointment.  Test Results: Cytology 10/28/2023 FINAL MICROSCOPIC DIAGNOSIS:  A.  LEFT LUNG, UPPER LOBE, MASS, FINE NEEDLE ASPIRATION:  - Malignant  - Adenocarcinoma   B.  LEFT LUNG, UPPER LOBE, MASS, BRUSHING:  - No malignant cells identified  - Benign bronchial cells    Micro 10/28/2023 BAL No Growth Cx No growth AFB negative     Latest Ref Rng & Units 10/28/2023    9:48 AM 09/17/2023   10:48 AM 07/04/2023    3:37 PM  CBC  WBC 4.0 - 10.5 K/uL 4.2  6.9  5.7   Hemoglobin 13.0 - 17.0 g/dL 40.9  81.1  91.4   Hematocrit 39.0 - 52.0 % 44.0  45.1  44.4   Platelets 150 - 400 K/uL 142  171.0  145.0        Latest Ref Rng & Units 10/28/2023    9:48  AM 09/17/2023   10:48 AM 07/04/2023    3:37 PM  BMP  Glucose 70 - 99 mg/dL 098  119  70   BUN 8 - 23 mg/dL 10  20  15    Creatinine 0.61 - 1.24 mg/dL 1.47  8.29  5.62   Sodium 135 - 145 mmol/L 140  135  140   Potassium 3.5 - 5.1 mmol/L 4.0  4.4  3.3   Chloride 98 - 111 mmol/L 107  98  102   CO2 22 - 32 mmol/L 24  29  30    Calcium 8.9 - 10.3 mg/dL 9.2  9.5  9.4     BNP No results found for: "BNP"  ProBNP No results found for: "PROBNP"  PFT No results found for: "FEV1PRE", "FEV1POST", "FVCPRE", "FVCPOST", "TLC", "DLCOUNC", "PREFEV1FVCRT", "PSTFEV1FVCRT"  DG Chest Port 1 View Result Date: 10/28/2023 CLINICAL DATA:  Status post bronchoscopy with biopsy. EXAM: PORTABLE  CHEST 1 VIEW COMPARISON:  PET-CT 09/25/2023 and chest CT 08/29/2023 FINDINGS: Focal density in left upper lung compatible with the known lesion. Linear patchy densities in lower lungs probably represent areas of atelectasis. Negative for a pneumothorax. Heart size is normal. Atherosclerotic calcifications at the aortic arch. IMPRESSION: 1. No pneumothorax. 2. Focal density in the left upper lung compatible with the known lesion. Electronically Signed   By: Richarda Overlie M.D.   On: 10/28/2023 16:05   DG C-ARM BRONCHOSCOPY Result Date: 10/28/2023 C-ARM BRONCHOSCOPY: Fluoroscopy was utilized by the requesting physician.  No radiographic interpretation.   DG C-Arm 1-60 Min-No Report Result Date: 10/28/2023 Fluoroscopy was utilized by the requesting physician.  No radiographic interpretation.     Past medical hx Past Medical History:  Diagnosis Date   Arm fracture    left. Dx'd w/RSD post fracture   Arthritis    Carpal tunnel syndrome    Chest pain 2016   [facility] (Dr. Marcelle Overlie) doing stress echo, but feels like musculoskeletal chest wall pain is cause of his discomfort   Chronic back pain    COVID-19 06/2020   Deviated septum    Diverticulosis    GERD (gastroesophageal reflux disease)    Hyperlipidemia    Hypertension    Joint pain    Muscle pain    OSA on CPAP 07/04/2018   - Trial of CPAP therapy on 17 cm H2O with a Large size Fisher&Paykel Full Face Mask Simplus mask and heated humidification.   Plantar fasciitis    Pulmonary nodule 04/2016   Lingula.  Pt chose to repeat CT chest w/out contrast in 3 mo---as per radiologist's recommendations.   Right inguinal hernia    fat-containing.  Small hydrocele in left hemiscrotum.   Sleep apnea    Snoring    Swelling    of legs/feet/hands   Tobacco dependence in remission      Social History   Tobacco Use   Smoking status: Former    Current packs/day: 0.00    Average packs/day: 3.0 packs/day for 30.0 years (90.0 ttl pk-yrs)     Types: Cigarettes    Start date: 07/16/1972    Quit date: 07/16/2002    Years since quitting: 21.3   Smokeless tobacco: Never  Vaping Use   Vaping status: Never Used  Substance Use Topics   Alcohol use: Yes    Alcohol/week: 1.0 - 2.0 standard drink of alcohol    Types: 1 - 2 Cans of beer per week    Comment: socially   Drug use: No  Mr.Plato reports that he quit smoking about 21 years ago. His smoking use included cigarettes. He started smoking about 51 years ago. He has a 90 pack-year smoking history. He has never used smokeless tobacco. He reports current alcohol use of about 1.0 - 2.0 standard drink of alcohol per week. He reports that he does not use drugs.  Tobacco Cessation: Former smoker, quit in 2003 with a 90-pack-year smoking history   Past surgical hx, Family hx, Social hx all reviewed.  Current Outpatient Medications on File Prior to Visit  Medication Sig   amLODipine (NORVASC) 10 MG tablet Take 1 tablet (10 mg total) by mouth daily.   aspirin 81 MG tablet Take 81 mg by mouth daily.   baclofen (LIORESAL) 20 MG tablet Take 0.5-1 tablets (10-20 mg total) by mouth 3 (three) times daily.   escitalopram (LEXAPRO) 20 MG tablet Take 1 tablet (20 mg total) by mouth daily.   famotidine (PEPCID) 20 MG tablet Take 1 tablet (20 mg total) by mouth 2 (two) times daily. As needed per Pt   gabapentin (NEURONTIN) 600 MG tablet Take 1 tablet (600 mg total) by mouth 3 (three) times daily. 1 tab morning, 1.5 tabs afternoon, 1 tab evening,  doses should be every 8 hours.  Per Pt as needed   hydrochlorothiazide (HYDRODIURIL) 25 MG tablet Take 1 tablet (25 mg total) by mouth daily.   lisinopril (ZESTRIL) 10 MG tablet Take 1 tablet (10 mg total) by mouth daily.   MegaRed Omega-3 Krill Oil 500 MG CAPS Take 2 capsules by mouth daily.    MYRBETRIQ 50 MG TB24 tablet Take 50 mg by mouth daily.   oxyCODONE-acetaminophen (PERCOCET) 10-325 MG tablet Take 1 tablet by mouth every 8 (eight)  hours as needed for pain.   simvastatin (ZOCOR) 10 MG tablet Take 1 tablet (10 mg total) by mouth every other day.   No current facility-administered medications on file prior to visit.     Allergies  Allergen Reactions   Penicillins Hives   Celebrex [Celecoxib]    Cymbalta [Duloxetine Hcl]     Review Of Systems:  Constitutional:   No  weight loss, night sweats,  Fevers, chills, fatigue, or  lassitude.  HEENT:   No headaches,  Difficulty swallowing,  Tooth/dental problems, or  Sore throat,                No sneezing, itching, ear ache, nasal congestion, post nasal drip,   CV:  No chest pain,  Orthopnea, PND, swelling in lower extremities, anasarca, dizziness, palpitations, syncope.   GI  No heartburn, indigestion, abdominal pain, nausea, vomiting, diarrhea, change in bowel habits, loss of appetite, bloody stools.   Resp: No shortness of breath with exertion or at rest.  No excess mucus, no productive cough,  No non-productive cough,  No coughing up of blood.  No change in color of mucus.  No wheezing.  No chest wall deformity  Skin: no rash or lesions.  GU: no dysuria, change in color of urine, no urgency or frequency.  No flank pain, no hematuria   MS:  No joint pain or swelling.  No decreased range of motion.  No back pain.  Psych:  No change in mood or affect. No depression or anxiety.  No memory loss.   Vital Signs BP 115/69 (BP Location: Left Arm, Patient Position: Sitting, Cuff Size: Large)   Pulse 67   Ht 6\' 2"  (1.88 m)   Wt 195 lb 12.8 oz (88.8 kg)  SpO2 96%   BMI 25.14 kg/m    Physical Exam:  General- No distress,  A&Ox3 ENT: No sinus tenderness, TM clear, pale nasal mucosa, no oral exudate,no post nasal drip, no LAN Cardiac: S1, S2, regular rate and rhythm, no murmur Chest: No wheeze/ rales/ dullness; no accessory muscle use, no nasal flaring, no sternal retractions Abd.: Soft Non-tender, nondistended, bowel sounds positive,Body mass index is 25.14 kg/m.   Ext: No clubbing cyanosis, edema, no obvious deformities Neuro:  normal strength, moving all extremities x 4, alert and oriented x 3, appropriate Skin: No rashes, warm and dry, no obvious lesions Psych: normal mood and behavior, appropriate for recent diagnosis of non-small cell lung cancer   Assessment/Plan Follow-up bronchoscopy with biopsies New diagnosis adenocarcinoma of the lung Former smoker quit 2003 with a 90-pack-year smoking history Plan Your biopsy was positive for adenocarcinoma of the lung. This is a non small cell lung cancer.  I have referred you to both radiation oncology and thoracic surgery to be evaluated. You will get calls to get these appointments scheduled.  I will also get you scheduled today for PFT's, as surgery will need these to determine if you are a surgical candidate.  Follow up as needed.  Daisey Must with your treatment. The cancer center will take great care of you.  Please contact office for sooner follow up if symptoms do not improve or worsen or seek emergency care    I spent 35 minutes dedicated to the care of this patient on the date of this encounter to include pre-visit review of records, face-to-face time with the patient discussing conditions above, post visit ordering of testing, clinical documentation with the electronic health record, making appropriate referrals as documented, and communicating necessary information to the patient's healthcare team.      Bevelyn Ngo, NP 11/04/2023  10:50 AM

## 2023-11-05 NOTE — Progress Notes (Signed)
Thoracic Location of Tumor / Histology: Left Upper Lobe Lung  Patient presented with complaints of right shoulder and rib pain.  Imaging was obtained and noted a mass in the left upper lobe measuring 3.6 cm.   Bronchoscopy 10/28/2023:     Biopsies of Left Lung Mass 10/28/2023   Past/Anticipated interventions by cardiothoracic surgery, if any:  Dr. Cliffton Asters 11/15/2023  Past/Anticipated interventions by medical oncology, if any:    Tobacco/Marijuana/Snuff/ETOH use: Former smoker, quit 2003  Signs/Symptoms Weight changes, if any: Stable. Respiratory complaints, if any: Denies SOB, walking about 2.5 miles daily. Hemoptysis, if any: He reports non-productive cough, occasional white sputum. Pain issues, if any:  Denies chest pain, pressure, or tightness.  SAFETY ISSUES: Prior radiation? No Pacemaker/ICD? No  Possible current pregnancy? N/a Is the patient on methotrexate? No  Current Complaints / other details:

## 2023-11-06 ENCOUNTER — Ambulatory Visit
Admission: RE | Admit: 2023-11-06 | Discharge: 2023-11-06 | Discharge status: Home / Self Care | Payer: PPO | Source: Ambulatory Visit | Attending: Radiation Oncology | Admitting: Radiation Oncology

## 2023-11-06 ENCOUNTER — Ambulatory Visit
Admission: RE | Admit: 2023-11-06 | Discharge: 2023-11-06 | Disposition: A | Discharge status: Home / Self Care | Payer: PPO | Source: Ambulatory Visit | Attending: Radiation Oncology | Admitting: Radiation Oncology

## 2023-11-06 ENCOUNTER — Encounter: Payer: Self-pay | Admitting: Radiation Oncology

## 2023-11-06 VITALS — BP 108/58 | HR 69 | Temp 97.3°F | Resp 18 | Ht 74.0 in | Wt 199.0 lb

## 2023-11-06 DIAGNOSIS — M545 Low back pain, unspecified: Secondary | ICD-10-CM | POA: Diagnosis not present

## 2023-11-06 DIAGNOSIS — K219 Gastro-esophageal reflux disease without esophagitis: Secondary | ICD-10-CM | POA: Insufficient documentation

## 2023-11-06 DIAGNOSIS — I700 Atherosclerosis of aorta: Secondary | ICD-10-CM | POA: Insufficient documentation

## 2023-11-06 DIAGNOSIS — E785 Hyperlipidemia, unspecified: Secondary | ICD-10-CM | POA: Insufficient documentation

## 2023-11-06 DIAGNOSIS — Z79899 Other long term (current) drug therapy: Secondary | ICD-10-CM | POA: Diagnosis not present

## 2023-11-06 DIAGNOSIS — G4730 Sleep apnea, unspecified: Secondary | ICD-10-CM | POA: Insufficient documentation

## 2023-11-06 DIAGNOSIS — K4090 Unilateral inguinal hernia, without obstruction or gangrene, not specified as recurrent: Secondary | ICD-10-CM | POA: Insufficient documentation

## 2023-11-06 DIAGNOSIS — R109 Unspecified abdominal pain: Secondary | ICD-10-CM | POA: Insufficient documentation

## 2023-11-06 DIAGNOSIS — C3412 Malignant neoplasm of upper lobe, left bronchus or lung: Secondary | ICD-10-CM

## 2023-11-06 DIAGNOSIS — M722 Plantar fascial fibromatosis: Secondary | ICD-10-CM | POA: Insufficient documentation

## 2023-11-06 DIAGNOSIS — Z87891 Personal history of nicotine dependence: Secondary | ICD-10-CM | POA: Insufficient documentation

## 2023-11-06 DIAGNOSIS — R079 Chest pain, unspecified: Secondary | ICD-10-CM | POA: Diagnosis not present

## 2023-11-06 DIAGNOSIS — Z801 Family history of malignant neoplasm of trachea, bronchus and lung: Secondary | ICD-10-CM | POA: Diagnosis not present

## 2023-11-06 DIAGNOSIS — I10 Essential (primary) hypertension: Secondary | ICD-10-CM | POA: Diagnosis not present

## 2023-11-06 DIAGNOSIS — Z8616 Personal history of COVID-19: Secondary | ICD-10-CM | POA: Diagnosis not present

## 2023-11-06 DIAGNOSIS — Z7982 Long term (current) use of aspirin: Secondary | ICD-10-CM | POA: Diagnosis not present

## 2023-11-06 DIAGNOSIS — M129 Arthropathy, unspecified: Secondary | ICD-10-CM | POA: Insufficient documentation

## 2023-11-07 ENCOUNTER — Other Ambulatory Visit: Payer: Self-pay

## 2023-11-08 ENCOUNTER — Other Ambulatory Visit (HOSPITAL_BASED_OUTPATIENT_CLINIC_OR_DEPARTMENT_OTHER): Payer: Self-pay

## 2023-11-09 NOTE — Progress Notes (Signed)
 The proposed treatment discussed in conference is for discussion purpose only and is not a binding recommendation.  The patients have not been physically examined, or presented with their treatment options.  Therefore, final treatment plans cannot be decided.

## 2023-11-12 ENCOUNTER — Ambulatory Visit
Admission: RE | Admit: 2023-11-12 | Discharge: 2023-11-12 | Disposition: A | Discharge status: Home / Self Care | Payer: PPO | Source: Ambulatory Visit | Attending: Radiation Oncology | Admitting: Radiation Oncology

## 2023-11-12 DIAGNOSIS — C3412 Malignant neoplasm of upper lobe, left bronchus or lung: Secondary | ICD-10-CM | POA: Diagnosis not present

## 2023-11-12 MED ORDER — GADOPICLENOL 0.5 MMOL/ML IV SOLN
9.0000 mL | Freq: Once | INTRAVENOUS | Status: AC | PRN
  Administered 2023-11-12: 9 mL via INTRAVENOUS

## 2023-11-15 ENCOUNTER — Ambulatory Visit (HOSPITAL_BASED_OUTPATIENT_CLINIC_OR_DEPARTMENT_OTHER): Payer: PPO | Admitting: Emergency Medicine

## 2023-11-15 ENCOUNTER — Encounter: Payer: Self-pay | Admitting: *Deleted

## 2023-11-15 ENCOUNTER — Institutional Professional Consult (permissible substitution): Payer: PPO | Admitting: Thoracic Surgery (Cardiothoracic Vascular Surgery)

## 2023-11-15 ENCOUNTER — Other Ambulatory Visit: Payer: Self-pay | Admitting: *Deleted

## 2023-11-15 ENCOUNTER — Other Ambulatory Visit: Payer: Self-pay | Admitting: Thoracic Surgery (Cardiothoracic Vascular Surgery)

## 2023-11-15 VITALS — BP 116/70 | HR 96 | Resp 18 | Ht 73.0 in | Wt 195.0 lb

## 2023-11-15 DIAGNOSIS — C3412 Malignant neoplasm of upper lobe, left bronchus or lung: Secondary | ICD-10-CM

## 2023-11-15 DIAGNOSIS — R918 Other nonspecific abnormal finding of lung field: Secondary | ICD-10-CM

## 2023-11-15 LAB — PULMONARY FUNCTION TEST
DL/VA % pred: 105 %
DL/VA: 4.23 ml/min/mmHg/L
DLCO unc % pred: 91 %
DLCO unc: 26.03 ml/min/mmHg
FEF 25-75 Post: 4.4 L/s
FEF 25-75 Pre: 3.76 L/s
FEF2575-%Change-Post: 17 %
FEF2575-%Pred-Post: 159 %
FEF2575-%Pred-Pre: 136 %
FEV1-%Change-Post: 0 %
FEV1-%Pred-Post: 96 %
FEV1-%Pred-Pre: 95 %
FEV1-Post: 3.5 L
FEV1-Pre: 3.47 L
FEV1FVC-%Change-Post: 5 %
FEV1FVC-%Pred-Pre: 110 %
FEV6-%Change-Post: -4 %
FEV6-%Pred-Post: 87 %
FEV6-%Pred-Pre: 91 %
FEV6-Post: 4.09 L
FEV6-Pre: 4.27 L
FEV6FVC-%Change-Post: 0 %
FEV6FVC-%Pred-Post: 105 %
FEV6FVC-%Pred-Pre: 105 %
FVC-%Change-Post: -4 %
FVC-%Pred-Post: 82 %
FVC-%Pred-Pre: 86 %
FVC-Post: 4.09 L
FVC-Pre: 4.27 L
Post FEV1/FVC ratio: 86 %
Post FEV6/FVC ratio: 100 %
Pre FEV1/FVC ratio: 81 %
Pre FEV6/FVC Ratio: 100 %
RV % pred: 153 %
RV: 4.03 L
TLC % pred: 103 %
TLC: 7.91 L

## 2023-11-15 NOTE — Progress Notes (Signed)
 [address]       [place] [address]             [phone]                    Nicholas Graham [facility] Medical Record #[id number] Date of Birth: [birth year]  Referring: Bevelyn Ngo, NP Primary Care: Natalia Leatherwood, DO Primary Cardiologist: None  Chief Complaint:    Chief Complaint  Patient presents with   Lung Lesion    Review workup     History of Present Illness:    Nicholas Graham 73 y.o. male presents for surgical evaluation of a biopsy-proven 3.6 cm left upper lobe non-small cell lung cancer.  This was found incidentally.  The patient does have a remote history of smoking, and quit over 20 years ago.  Recently he has been having some headaches on a daily basis, and an MRI brain was ordered.        Past Medical History:  Diagnosis Date   Arm fracture    left. Dx'd w/RSD post fracture   Arthritis    Carpal tunnel syndrome    Chest pain 2016   [facility] (Dr. Marcelle Overlie) doing stress echo, but feels like musculoskeletal chest wall pain is cause of his discomfort   Chronic back pain    COVID-19 06/2020   Deviated septum    Diverticulosis    GERD (gastroesophageal reflux disease)    Hyperlipidemia    Hypertension    Joint pain    Muscle pain    OSA on CPAP 07/04/2018   - Trial of CPAP therapy on 17 cm H2O with a Large size Fisher&Paykel Full Face Mask Simplus mask and heated humidification.   Plantar fasciitis    Pulmonary nodule 04/2016   Lingula.  Pt chose to repeat CT chest w/out contrast in 3 mo---as per radiologist's recommendations.   Right inguinal hernia    fat-containing.  Small hydrocele in left hemiscrotum.   Sleep apnea    Snoring    Swelling    of legs/feet/hands   Tobacco dependence in remission     Past Surgical History:  Procedure Laterality Date   BRONCHIAL BIOPSY  10/28/2023   Procedure: BRONCHIAL BIOPSIES;  Surgeon: Leslye Peer, MD;  Location: [facility] ENDOSCOPY;  Service: Pulmonary;;   BRONCHIAL  BRUSHINGS  10/28/2023   Procedure: BRONCHIAL BRUSHINGS;  Surgeon: Leslye Peer, MD;  Location: [facility] ENDOSCOPY;  Service: Pulmonary;;   BRONCHIAL NEEDLE ASPIRATION BIOPSY  10/28/2023   Procedure: BRONCHIAL NEEDLE ASPIRATION BIOPSIES;  Surgeon: Leslye Peer, MD;  Location: [facility] ENDOSCOPY;  Service: Pulmonary;;   BRONCHIAL WASHINGS  10/28/2023   Procedure: BRONCHIAL WASHINGS;  Surgeon: Leslye Peer, MD;  Location: [facility] ENDOSCOPY;  Service: Pulmonary;;   COLONOSCOPY     fracture repair left arm     had a fixator   INGUINAL HERNIA REPAIR Left 2013   repeat hernia repair. x2   IR RADIOLOGIST EVAL & MGMT  07/22/2023   KNEE ARTHROSCOPY Right    LUMBAR DISC SURGERY  1993   NEURECTOMY inguinal after hernia repair Left    PLANTAR FASCIA SURGERY Right 2000s   SHOULDER ARTHROSCOPY Right    UPPER GI ENDOSCOPY      Family History  Problem Relation Age of Onset   Lung cancer Mother    Lung cancer Father    Bone cancer Brother        bone  Heart disease Maternal Uncle    Colon cancer Neg Hx    Esophageal cancer Neg Hx    Stomach cancer Neg Hx      Social History   Tobacco Use  Smoking Status Former   Current packs/day: 0.00   Average packs/day: 3.0 packs/day for 30.0 years (90.0 ttl pk-yrs)   Types: Cigarettes   Start date: 07/16/1972   Quit date: 07/16/2002   Years since quitting: 21.3  Smokeless Tobacco Never    Social History   Substance and Sexual Activity  Alcohol Use Yes   Alcohol/week: 1.0 - 2.0 standard drink of alcohol   Types: 1 - 2 Cans of beer per week   Comment: socially     Allergies  Allergen Reactions   Penicillins Hives   Celebrex [Celecoxib]    Cymbalta [Duloxetine Hcl]     Current Outpatient Medications  Medication Sig Dispense Refill   amLODipine (NORVASC) 10 MG tablet Take 1 tablet (10 mg total) by mouth daily. 90 tablet 1   aspirin 81 MG tablet Take 81 mg by mouth daily.     baclofen (LIORESAL) 20 MG tablet Take 0.5-1 tablets (10-20 mg total) by  mouth 3 (three) times daily. 270 tablet 1   escitalopram (LEXAPRO) 20 MG tablet Take 1 tablet (20 mg total) by mouth daily. 90 tablet 1   famotidine (PEPCID) 20 MG tablet Take 1 tablet (20 mg total) by mouth 2 (two) times daily. As needed per Pt     gabapentin (NEURONTIN) 600 MG tablet Take 1 tablet (600 mg total) by mouth 3 (three) times daily. 1 tab morning, 1.5 tabs afternoon, 1 tab evening,  doses should be every 8 hours.  Per Pt as needed     hydrochlorothiazide (HYDRODIURIL) 25 MG tablet Take 1 tablet (25 mg total) by mouth daily. 90 tablet 1   lisinopril (ZESTRIL) 10 MG tablet Take 1 tablet (10 mg total) by mouth daily. 90 tablet 1   MegaRed Omega-3 Krill Oil 500 MG CAPS Take 2 capsules by mouth daily.      MYRBETRIQ 50 MG TB24 tablet Take 50 mg by mouth daily.     oxyCODONE-acetaminophen (PERCOCET) 10-325 MG tablet Take 1 tablet by mouth every 8 (eight) hours as needed for pain. 90 tablet 0   pantoprazole (PROTONIX) 40 MG tablet Take 40 mg by mouth daily.     simvastatin (ZOCOR) 10 MG tablet Take 1 tablet (10 mg total) by mouth every other day. 45 tablet 1   No current facility-administered medications for this visit.    Review of Systems  Respiratory:  Negative for cough and shortness of breath.   Cardiovascular:  Negative for chest pain.  Neurological:  Positive for headaches.  All other systems reviewed and are negative.    PHYSICAL EXAMINATION: BP 116/70 (BP Location: Left Arm)   Pulse 96   Resp 18   Ht 6\' 1"  (1.854 m)   Wt 195 lb (88.5 kg)   SpO2 96% Comment: RA  BMI 25.73 kg/m  Physical Exam Constitutional:      General: He is not in acute distress.    Appearance: He is not ill-appearing.  HENT:     Head: Normocephalic and atraumatic.  Eyes:     Extraocular Movements: Extraocular movements intact.  Cardiovascular:     Rate and Rhythm: Normal rate.  Pulmonary:     Effort: Pulmonary effort is normal. No respiratory distress.  Abdominal:     General: Abdomen  is flat. There is  no distension.  Musculoskeletal:        General: Normal range of motion.  Skin:    General: Skin is warm and dry.  Neurological:     General: No focal deficit present.     Mental Status: He is alert and oriented to person, place, and time.          I have independently reviewed the above radiology studies  and reviewed the findings with the patient.   Recent Lab Findings: Lab Results  Component Value Date   WBC 4.2 10/28/2023   HGB 14.5 10/28/2023   HCT 44.0 10/28/2023   PLT 142 (L) 10/28/2023   GLUCOSE 101 (H) 10/28/2023   CHOL 182 06/20/2023   TRIG 78.0 06/20/2023   HDL 53.20 06/20/2023   LDLCALC 113 (H) 06/20/2023   ALT 15 09/17/2023   AST 20 09/17/2023   NA 140 10/28/2023   K 4.0 10/28/2023   CL 107 10/28/2023   CREATININE 0.82 10/28/2023   BUN 10 10/28/2023   CO2 24 10/28/2023   TSH 0.97 06/20/2023   INR 0.9 09/17/2023   HGBA1C 5.8 06/20/2023    Diagnostic Studies & Laboratory data:     Recent Radiology Findings:   [facility] Chest Port 1 View Result Date: 10/28/2023 CLINICAL DATA:  Status post bronchoscopy with biopsy. EXAM: PORTABLE CHEST 1 VIEW COMPARISON:  PET-CT 09/25/2023 and chest CT 08/29/2023 FINDINGS: Focal density in left upper lung compatible with the known lesion. Linear patchy densities in lower lungs probably represent areas of atelectasis. Negative for a pneumothorax. Heart size is normal. Atherosclerotic calcifications at the aortic arch. IMPRESSION: 1. No pneumothorax. 2. Focal density in the left upper lung compatible with the known lesion. Electronically Signed   By: Richarda Overlie M.D.   On: 10/28/2023 16:05   DG C-ARM BRONCHOSCOPY Result Date: 10/28/2023 C-ARM BRONCHOSCOPY: Fluoroscopy was utilized by the requesting physician.  No radiographic interpretation.   DG C-Arm 1-60 Min-No Report Result Date: 10/28/2023 Fluoroscopy was utilized by the requesting physician.  No radiographic interpretation.     PFTs:  - FVC: 86% -  FEV1: 95% -DLCO: 91%  IMPRESSION: 1. Persistent platelike mass density in the LEFT upper lobe with central metabolic activity. Indeterminate lesion. Persistence of lesion is concerning for bronchogenic carcinoma. Recommend bronchoscopy for further evaluation. 2. No evidence of metastatic adenopathy or distant metastatic disease. 3. Benign nodule in the lingula.    Assessment / Plan:   73 year old male with a 3.6 cm left upper lobe biopsy-proven non-small cell lung cancer.  He also has a history of recent headaches.  His MRI brain was reviewed by me, and on my read there is no evidence of any metastatic disease.  I will await formal read.  We discussed the risks and benefits of a left robotic assisted thoracoscopy with left upper lobectomy.  He is agreeable to proceed.     I  spent 40 minutes with  the patient face to face in counseling and coordination of care.    Corliss Skains 11/15/2023 12:05 PM

## 2023-11-15 NOTE — H&P (View-Only) (Signed)
 [address]       [place] [address]             [phone]                    [name] [facility] Medical Record #[id number] Date of Birth: [birth year]  Referring: Bevelyn Ngo, NP Primary Care: Natalia Leatherwood, DO Primary Cardiologist: None  Chief Complaint:    Chief Complaint  Patient presents with   Lung Lesion    Review workup     History of Present Illness:    Nicholas Graham 73 y.o. male presents for surgical evaluation of a biopsy-proven 3.6 cm left upper lobe non-small cell lung cancer.  This was found incidentally.  The patient does have a remote history of smoking, and quit over 20 years ago.  Recently he has been having some headaches on a daily basis, and an MRI brain was ordered.        Past Medical History:  Diagnosis Date   Arm fracture    left. Dx'd w/RSD post fracture   Arthritis    Carpal tunnel syndrome    Chest pain 2016   [facility] (Dr. Marcelle Overlie) doing stress echo, but feels like musculoskeletal chest wall pain is cause of his discomfort   Chronic back pain    COVID-19 06/2020   Deviated septum    Diverticulosis    GERD (gastroesophageal reflux disease)    Hyperlipidemia    Hypertension    Joint pain    Muscle pain    OSA on CPAP 07/04/2018   - Trial of CPAP therapy on 17 cm H2O with a Large size [employer] Full Face Mask Simplus mask and heated humidification.   Plantar fasciitis    Pulmonary nodule 04/2016   Lingula.  Pt chose to repeat CT chest w/out contrast in 3 mo---as per radiologist's recommendations.   Right inguinal hernia    fat-containing.  Small hydrocele in left hemiscrotum.   Sleep apnea    Snoring    Swelling    of legs/feet/hands   Tobacco dependence in remission     Past Surgical History:  Procedure Laterality Date   BRONCHIAL BIOPSY  10/28/2023   Procedure: BRONCHIAL BIOPSIES;  Surgeon: Leslye Peer, MD;  Location: [facility] ENDOSCOPY;  Service: Pulmonary;;   BRONCHIAL  BRUSHINGS  10/28/2023   Procedure: BRONCHIAL BRUSHINGS;  Surgeon: Leslye Peer, MD;  Location: [facility] ENDOSCOPY;  Service: Pulmonary;;   BRONCHIAL NEEDLE ASPIRATION BIOPSY  10/28/2023   Procedure: BRONCHIAL NEEDLE ASPIRATION BIOPSIES;  Surgeon: Leslye Peer, MD;  Location: [facility] ENDOSCOPY;  Service: Pulmonary;;   BRONCHIAL WASHINGS  10/28/2023   Procedure: BRONCHIAL WASHINGS;  Surgeon: Leslye Peer, MD;  Location: [facility] ENDOSCOPY;  Service: Pulmonary;;   COLONOSCOPY     fracture repair left arm     had a fixator   INGUINAL HERNIA REPAIR Left 2013   repeat hernia repair. x2   IR RADIOLOGIST EVAL & MGMT  07/22/2023   KNEE ARTHROSCOPY Right    LUMBAR DISC SURGERY  1993   NEURECTOMY inguinal after hernia repair Left    PLANTAR FASCIA SURGERY Right 2000s   SHOULDER ARTHROSCOPY Right    UPPER GI ENDOSCOPY      Family History  Problem Relation Age of Onset   Lung cancer Mother    Lung cancer Father    Bone cancer Brother        bone  Heart disease Maternal Uncle    Colon cancer Neg Hx    Esophageal cancer Neg Hx    Stomach cancer Neg Hx      Social History   Tobacco Use  Smoking Status Former   Current packs/day: 0.00   Average packs/day: 3.0 packs/day for 30.0 years (90.0 ttl pk-yrs)   Types: Cigarettes   Start date: 07/16/1972   Quit date: 07/16/2002   Years since quitting: 21.3  Smokeless Tobacco Never    Social History   Substance and Sexual Activity  Alcohol Use Yes   Alcohol/week: 1.0 - 2.0 standard drink of alcohol   Types: 1 - 2 Cans of beer per week   Comment: socially     Allergies  Allergen Reactions   Penicillins Hives   Celebrex [Celecoxib]    Cymbalta [Duloxetine Hcl]     Current Outpatient Medications  Medication Sig Dispense Refill   amLODipine (NORVASC) 10 MG tablet Take 1 tablet (10 mg total) by mouth daily. 90 tablet 1   aspirin 81 MG tablet Take 81 mg by mouth daily.     baclofen (LIORESAL) 20 MG tablet Take 0.5-1 tablets (10-20 mg total) by  mouth 3 (three) times daily. 270 tablet 1   escitalopram (LEXAPRO) 20 MG tablet Take 1 tablet (20 mg total) by mouth daily. 90 tablet 1   famotidine (PEPCID) 20 MG tablet Take 1 tablet (20 mg total) by mouth 2 (two) times daily. As needed per Pt     gabapentin (NEURONTIN) 600 MG tablet Take 1 tablet (600 mg total) by mouth 3 (three) times daily. 1 tab morning, 1.5 tabs afternoon, 1 tab evening,  doses should be every 8 hours.  Per Pt as needed     hydrochlorothiazide (HYDRODIURIL) 25 MG tablet Take 1 tablet (25 mg total) by mouth daily. 90 tablet 1   lisinopril (ZESTRIL) 10 MG tablet Take 1 tablet (10 mg total) by mouth daily. 90 tablet 1   MegaRed Omega-3 Krill Oil 500 MG CAPS Take 2 capsules by mouth daily.      MYRBETRIQ 50 MG TB24 tablet Take 50 mg by mouth daily.     oxyCODONE-acetaminophen (PERCOCET) 10-325 MG tablet Take 1 tablet by mouth every 8 (eight) hours as needed for pain. 90 tablet 0   pantoprazole (PROTONIX) 40 MG tablet Take 40 mg by mouth daily.     simvastatin (ZOCOR) 10 MG tablet Take 1 tablet (10 mg total) by mouth every other day. 45 tablet 1   No current facility-administered medications for this visit.    Review of Systems  Respiratory:  Negative for cough and shortness of breath.   Cardiovascular:  Negative for chest pain.  Neurological:  Positive for headaches.  All other systems reviewed and are negative.    PHYSICAL EXAMINATION: BP 116/70 (BP Location: Left Arm)   Pulse 96   Resp 18   Ht 6\' 1"  (1.854 m)   Wt 195 lb (88.5 kg)   SpO2 96% Comment: RA  BMI 25.73 kg/m  Physical Exam Constitutional:      General: He is not in acute distress.    Appearance: He is not ill-appearing.  HENT:     Head: Normocephalic and atraumatic.  Eyes:     Extraocular Movements: Extraocular movements intact.  Cardiovascular:     Rate and Rhythm: Normal rate.  Pulmonary:     Effort: Pulmonary effort is normal. No respiratory distress.  Abdominal:     General: Abdomen  is flat. There is  no distension.  Musculoskeletal:        General: Normal range of motion.  Skin:    General: Skin is warm and dry.  Neurological:     General: No focal deficit present.     Mental Status: He is alert and oriented to person, place, and time.          I have independently reviewed the above radiology studies  and reviewed the findings with the patient.   Recent Lab Findings: Lab Results  Component Value Date   WBC 4.2 10/28/2023   HGB 14.5 10/28/2023   HCT 44.0 10/28/2023   PLT 142 (L) 10/28/2023   GLUCOSE 101 (H) 10/28/2023   CHOL 182 06/20/2023   TRIG 78.0 06/20/2023   HDL 53.20 06/20/2023   LDLCALC 113 (H) 06/20/2023   ALT 15 09/17/2023   AST 20 09/17/2023   NA 140 10/28/2023   K 4.0 10/28/2023   CL 107 10/28/2023   CREATININE 0.82 10/28/2023   BUN 10 10/28/2023   CO2 24 10/28/2023   TSH 0.97 06/20/2023   INR 0.9 09/17/2023   HGBA1C 5.8 06/20/2023    Diagnostic Studies & Laboratory data:     Recent Radiology Findings:   [facility] Chest Port 1 View Result Date: 10/28/2023 CLINICAL DATA:  Status post bronchoscopy with biopsy. EXAM: PORTABLE CHEST 1 VIEW COMPARISON:  PET-CT 09/25/2023 and chest CT 08/29/2023 FINDINGS: Focal density in left upper lung compatible with the known lesion. Linear patchy densities in lower lungs probably represent areas of atelectasis. Negative for a pneumothorax. Heart size is normal. Atherosclerotic calcifications at the aortic arch. IMPRESSION: 1. No pneumothorax. 2. Focal density in the left upper lung compatible with the known lesion. Electronically Signed   By: Richarda Overlie M.D.   On: 10/28/2023 16:05   DG C-ARM BRONCHOSCOPY Result Date: 10/28/2023 C-ARM BRONCHOSCOPY: Fluoroscopy was utilized by the requesting physician.  No radiographic interpretation.   DG C-Arm 1-60 Min-No Report Result Date: 10/28/2023 Fluoroscopy was utilized by the requesting physician.  No radiographic interpretation.     PFTs:  - FVC: 86% -  FEV1: 95% -DLCO: 91%  IMPRESSION: 1. Persistent platelike mass density in the LEFT upper lobe with central metabolic activity. Indeterminate lesion. Persistence of lesion is concerning for bronchogenic carcinoma. Recommend bronchoscopy for further evaluation. 2. No evidence of metastatic adenopathy or distant metastatic disease. 3. Benign nodule in the lingula.    Assessment / Plan:   73 year old male with a 3.6 cm left upper lobe biopsy-proven non-small cell lung cancer.  He also has a history of recent headaches.  His MRI brain was reviewed by me, and on my read there is no evidence of any metastatic disease.  I will await formal read.  We discussed the risks and benefits of a left robotic assisted thoracoscopy with left upper lobectomy.  He is agreeable to proceed.     I  spent 40 minutes with  the patient face to face in counseling and coordination of care.    Nicholas Graham 11/15/2023 12:05 PM

## 2023-11-15 NOTE — Progress Notes (Signed)
 Full PFT Performed Today

## 2023-11-15 NOTE — Patient Instructions (Signed)
 Full PFT Performed Today

## 2023-11-18 ENCOUNTER — Encounter (HOSPITAL_COMMUNITY): Payer: Self-pay

## 2023-11-18 ENCOUNTER — Encounter (HOSPITAL_COMMUNITY): Payer: Self-pay | Admitting: Thoracic Surgery (Cardiothoracic Vascular Surgery)

## 2023-11-19 ENCOUNTER — Other Ambulatory Visit: Payer: Self-pay | Admitting: Family Medicine

## 2023-11-19 NOTE — Telephone Encounter (Signed)
 Pt has OV on 3/6

## 2023-11-20 ENCOUNTER — Ambulatory Visit (HOSPITAL_COMMUNITY): Attending: Thoracic Surgery (Cardiothoracic Vascular Surgery)

## 2023-11-20 DIAGNOSIS — Z01810 Encounter for preprocedural cardiovascular examination: Secondary | ICD-10-CM | POA: Insufficient documentation

## 2023-11-20 DIAGNOSIS — C3412 Malignant neoplasm of upper lobe, left bronchus or lung: Secondary | ICD-10-CM | POA: Diagnosis not present

## 2023-11-20 LAB — MYOCARDIAL PERFUSION IMAGING
LV dias vol: 83 mL (ref 62–150)
LV sys vol: 32 mL
Nuc Stress EF: 61 %
Peak HR: 104 {beats}/min
Rest HR: 83 {beats}/min
Rest Nuclear Isotope Dose: 10.1 mCi
SDS: 1
SRS: 0
SSS: 1
ST Depression (mm): 0 mm
Stress Nuclear Isotope Dose: 30.2 mCi
TID: 1.1

## 2023-11-20 MED ORDER — TECHNETIUM TC 99M TETROFOSMIN IV KIT
10.1000 | PACK | Freq: Once | INTRAVENOUS | Status: AC | PRN
  Administered 2023-11-20: 10.1 via INTRAVENOUS

## 2023-11-20 MED ORDER — TECHNETIUM TC 99M TETROFOSMIN IV KIT: 30.2000 | PACK | Freq: Once | INTRAVENOUS | Status: AC | PRN

## 2023-11-20 MED ORDER — REGADENOSON 0.4 MG/5ML IV SOLN: 0.4000 mg | Freq: Once | INTRAVENOUS | Status: AC

## 2023-11-21 ENCOUNTER — Ambulatory Visit: Payer: PPO | Admitting: Family Medicine

## 2023-11-21 ENCOUNTER — Encounter: Payer: Self-pay | Admitting: Family Medicine

## 2023-11-21 VITALS — BP 116/80 | HR 83 | Temp 98.2°F | Wt 191.4 lb

## 2023-11-21 DIAGNOSIS — Z9889 Other specified postprocedural states: Secondary | ICD-10-CM | POA: Diagnosis not present

## 2023-11-21 DIAGNOSIS — I10 Essential (primary) hypertension: Secondary | ICD-10-CM | POA: Diagnosis not present

## 2023-11-21 DIAGNOSIS — C3412 Malignant neoplasm of upper lobe, left bronchus or lung: Secondary | ICD-10-CM

## 2023-11-21 MED ORDER — ESCITALOPRAM OXALATE 20 MG PO TABS: 20.0000 mg | ORAL_TABLET | Freq: Every day | ORAL | 1 refills | Status: DC

## 2023-11-21 MED ORDER — AMLODIPINE BESYLATE 10 MG PO TABS: 10.0000 mg | ORAL_TABLET | Freq: Every day | ORAL | 1 refills | Status: DC

## 2023-11-21 MED ORDER — BACLOFEN 20 MG PO TABS
10.0000 mg | ORAL_TABLET | Freq: Three times a day (TID) | ORAL | 1 refills | Status: DC
Start: 2023-11-21 — End: 2024-04-27

## 2023-11-21 MED ORDER — PANTOPRAZOLE SODIUM 40 MG PO TBEC: 40.0000 mg | DELAYED_RELEASE_TABLET | Freq: Every day | ORAL | 1 refills | Status: DC

## 2023-11-21 MED ORDER — OXYCODONE-ACETAMINOPHEN 10-325 MG PO TABS: 1.0000 | ORAL_TABLET | Freq: Three times a day (TID) | ORAL | 0 refills | Status: DC | PRN

## 2023-11-21 MED ORDER — LISINOPRIL 10 MG PO TABS: 10.0000 mg | ORAL_TABLET | Freq: Every day | ORAL | 1 refills | Status: DC

## 2023-11-21 MED ORDER — HYDROCHLOROTHIAZIDE 25 MG PO TABS: 25.0000 mg | ORAL_TABLET | Freq: Every day | ORAL | 1 refills | Status: DC

## 2023-11-21 MED ORDER — FAMOTIDINE 20 MG PO TABS: 20.0000 mg | ORAL_TABLET | Freq: Two times a day (BID) | ORAL | 1 refills | Status: DC

## 2023-11-21 MED ORDER — GABAPENTIN 600 MG PO TABS: 600.0000 mg | ORAL_TABLET | Freq: Three times a day (TID) | ORAL | 1 refills | Status: DC

## 2023-11-21 NOTE — Progress Notes (Signed)
 Patient ID: Nicholas Graham, male  DOB: [birth year], 73 y.o.   MRN: [id number] Patient Care Team    Relationship Specialty Notifications Start End  Natalia Leatherwood, DO PCP - General Family Medicine  10/31/15   Eldred Manges, MD Consulting Physician Orthopedic Surgery  10/26/21   Armbruster, Willaim Rayas, MD Consulting Physician Gastroenterology  05/29/23   Noel Christmas, MD Consulting Physician Urology  05/29/23   Waymon Budge, MD Consulting Physician Pulmonary Disease  10/08/23   Corliss Skains, MD Consulting Physician Cardiothoracic Surgery  11/21/23   Rodolph Bong, MD Consulting Physician Sports Medicine  11/21/23     Chief Complaint  Patient presents with   Medical Management of Chronic Issues    Subjective:  Nicholas Graham is a 73 y.o. male present for Chronic Conditions/illness Management  All past medical history, surgical history, allergies, family history, immunizations, medications and social history were updated in the electronic medical record today. All recent labs, ED visits and hospitalizations within the last year were reviewed.   Cervical neck pain with evidence of disc disease/Chronic midline thoracic back pain/Thoracic degenerative disc disease/DDD (degenerative disc disease), lumbar/History of lumbar discectomy Pt presents for his chronic pain management treatment for his cervical and lumbar conditions. His discomfort has progressed since 2020-current regimen does provide some relief, allows him to be active  He has been evaluated  by neuro and pain rehab. He has received injections- that have only provided a few days of temporary relief in the past . He is frustrated, but has appropriate expectations that he will be living with some pain.  He reports he is maintaining a quality of life with the use of opiates, that allows him to remain active with Percocet 10-325 (1 tab every 8-12 hours), baclofen and gabapentin. 600/600/600 (had headaches at increased doses).   Indication for chronic opioid: Cervical spine degeneration, chronic neck and back pain. Medication and dose: Oxycodone-acetaminophen 10-325 1 tab TID prn daily # pills per month: 60 Last UDS date:UYD 08/2023 Pain contract signed (Y/N): Yes- UTD-08/2023 Date narcotic database last reviewed (/include red flags): 11/21/23  03/08/2021  had epidural injection of cervical spine and he feels it is helpful.  11/2021-has had repeat injections that have not been as helpful. 08/2023: Dr, Wallace Cullens- performed injections.  Original note: Lower cervical to mid thoracic pain still present. Using his right arm exacerbates his pain midline of thoracic.  He has a history thoracic spine degeneration and discectomy in his lumbar spine.  Has degenerative changes, bone spurring and facet arthropathy in his lumbar spine by x-rays obtained 11/2018. Cervical spine x-ray with mild bilateral foraminal narrowing at C3-C4 11/2018. Unfortunately he was reported as allergic or intolerant to many of the medications that would be helpful for him such as gabapentin, Lyrica, Celebrex and Cymbalta --> we discussed this in more detail last visit and he believes that the "allergy-hallucinations " was secondary to having multiple of these medicines on board at the same time back in 2014. We tried to start gabapentin and it did not have any side effects from medication. It has not been extremely helpful  Yet at lower doses. He admits to rather sigmnificant stomach upset starting from the naproxen BID use ober the last 8 week. Advanced imaging has not bee able to be completed 2/2 to covid outbreak. He desired to wait on referral to specialist last visit in hope that he would receive benefit from the gabapentin.   Hypertension/HLD/overweight: Pt  reports compliance with amlodipine 10 mg daily, lisinopril 10 mg QD and HCTZ.  Patient denies chest pain, shortness of breath, dizziness or lower extremity edema.   Pt takes a daily baby ASA. Pt is   prescribed statin.  Labs UTD Diet: Low-sodium Exercise: Not exercising as routinely. RF: Hypertension, hyperlipidemia, family history of heart disease. Overweight.   ANXIETY/sleep disorder:  Patient reports he is feeling well on Lexapro 20 mg daily      11/06/2023   11:23 AM 10/08/2023    8:19 AM 06/19/2023    9:02 AM 01/18/2023   10:26 AM 11/13/2022    8:16 AM  Depression screen PHQ 2/9  Decreased Interest 0 0 0 0 0  Down, Depressed, Hopeless 0 0 0 0 0  PHQ - 2 Score 0 0 0 0 0      11/30/2021    8:32 AM 01/14/2020    8:20 AM 12/16/2019    8:06 AM 06/23/2019    8:30 AM  GAD 7 : Generalized Anxiety Score  Nervous, Anxious, on Edge 0 0 0 0  Control/stop worrying 0 0 0 0  Worry too much - different things 0 0 0 0  Trouble relaxing 0 3 0 0  Restless 0 3 3 1   Easily annoyed or irritable 0 1 0 0  Afraid - awful might happen 0 0 0 0  Total GAD 7 Score 0 7 3 1   Anxiety Difficulty  Not difficult at all Somewhat difficult Not difficult at all            11/04/2023   10:37 AM 10/10/2023    4:08 PM 10/08/2023    8:19 AM 06/19/2023    9:02 AM 01/18/2023   10:26 AM  Fall Risk   Falls in the past year? 0 0 0 0 0  Number falls in past yr:   0 0 0  Injury with Fall?   0 0 0  Risk for fall due to :   No Fall Risks No Fall Risks   Follow up   Falls evaluation completed Falls evaluation completed Falls evaluation completed       There is no immunization history on file for this patient.   Past Medical History:  Diagnosis Date   Arm fracture    left. Dx'd w/RSD post fracture   Arthritis    Carpal tunnel syndrome    Chest pain 2016   [facility] (Dr. Marcelle Overlie) doing stress echo, but feels like musculoskeletal chest wall pain is cause of his discomfort   Chronic back pain    COVID-19 06/2020   Deviated septum    Diverticulosis    GERD (gastroesophageal reflux disease)    Hyperlipidemia    Hypertension    Joint pain    Muscle pain    OSA on CPAP 07/04/2018   - Trial of  CPAP therapy on 17 cm H2O with a Large size [employer]kel Full Face Mask Simplus mask and heated humidification.   Plantar fasciitis    Pulmonary nodule 04/2016   Lingula.  Pt chose to repeat CT chest w/out contrast in 3 mo---as per radiologist's recommendations.   Right inguinal hernia    fat-containing.  Small hydrocele in left hemiscrotum.   Sleep apnea    Snoring    Swelling    of legs/feet/hands   Tobacco dependence in remission    Allergies  Allergen Reactions   Penicillins Hives   Celebrex [Celecoxib] Other (See Comments)    unknown reaction  Cymbalta [Duloxetine Hcl] Other (See Comments)    unknown   Past Surgical History:  Procedure Laterality Date   BRONCHIAL BIOPSY  10/28/2023   Procedure: BRONCHIAL BIOPSIES;  Surgeon: Leslye Peer, MD;  Location: [facility] ENDOSCOPY;  Service: Pulmonary;;   BRONCHIAL BRUSHINGS  10/28/2023   Procedure: BRONCHIAL BRUSHINGS;  Surgeon: Leslye Peer, MD;  Location: [facility] ENDOSCOPY;  Service: Pulmonary;;   BRONCHIAL NEEDLE ASPIRATION BIOPSY  10/28/2023   Procedure: BRONCHIAL NEEDLE ASPIRATION BIOPSIES;  Surgeon: Leslye Peer, MD;  Location: [facility] ENDOSCOPY;  Service: Pulmonary;;   BRONCHIAL WASHINGS  10/28/2023   Procedure: BRONCHIAL WASHINGS;  Surgeon: Leslye Peer, MD;  Location: [facility] ENDOSCOPY;  Service: Pulmonary;;   COLONOSCOPY     fracture repair left arm     had a fixator   INGUINAL HERNIA REPAIR Left 2013   repeat hernia repair. x2   IR RADIOLOGIST EVAL & MGMT  07/22/2023   KNEE ARTHROSCOPY Right    LUMBAR DISC SURGERY  1993   NEURECTOMY inguinal after hernia repair Left    PLANTAR FASCIA SURGERY Right 2000s   SHOULDER ARTHROSCOPY Right    UPPER GI ENDOSCOPY     Family History  Problem Relation Age of Onset   Lung cancer Mother    Lung cancer Father    Bone cancer Brother        bone   Heart disease Maternal Uncle    Colon cancer Neg Hx    Esophageal cancer Neg Hx    Stomach cancer Neg Hx    Social History   Social  History Narrative   Married, Designer, television/film set. Children (2) Adult, 4 grandchildren.    9 th grade, Retired.   Wears seatbelt   Smoke detector in the home.    Firearms locked in the home.    Feels safe in his relationships.     Allergies as of 11/21/2023       Reactions   Penicillins Hives   Celebrex [celecoxib] Other (See Comments)   unknown reaction   Cymbalta [duloxetine Hcl] Other (See Comments)   unknown        Medication List        Accurate as of November 21, 2023 10:13 AM. If you have any questions, ask your nurse or doctor.          amLODipine 10 MG tablet Commonly known as: NORVASC Take 1 tablet (10 mg total) by mouth daily.   aspirin 81 MG tablet Take 81 mg by mouth daily.   baclofen 20 MG tablet Commonly known as: LIORESAL Take 0.5-1 tablets (10-20 mg total) by mouth 3 (three) times daily.   escitalopram 20 MG tablet Commonly known as: LEXAPRO Take 1 tablet (20 mg total) by mouth daily.   famotidine 20 MG tablet Commonly known as: Pepcid Take 1 tablet (20 mg total) by mouth 2 (two) times daily. What changed: additional instructions Changed by: Felix Pacini   gabapentin 600 MG tablet Commonly known as: NEURONTIN Take 1 tablet (600 mg total) by mouth 3 (three) times daily. What changed: additional instructions Changed by: Felix Pacini   hydrochlorothiazide 25 MG tablet Commonly known as: HYDRODIURIL Take 1 tablet (25 mg total) by mouth daily.   lisinopril 10 MG tablet Commonly known as: ZESTRIL Take 1 tablet (10 mg total) by mouth daily.   MegaRed Omega-3 Krill Oil 500 MG Caps Take 2 capsules by mouth daily.   Myrbetriq 50 MG Tb24 tablet Generic drug: mirabegron ER Take 50 mg by mouth  daily.   oxyCODONE-acetaminophen 10-325 MG tablet Commonly known as: PERCOCET Take 1 tablet by mouth every 8 (eight) hours as needed for pain. What changed: Another medication with the same name was added. Make sure you understand how and when to take  each. Changed by: Felix Pacini   oxyCODONE-acetaminophen 10-325 MG tablet Commonly known as: PERCOCET Take 1 tablet by mouth every 8 (eight) hours as needed for pain. Start taking on: December 13, 2023 What changed: You were already taking a medication with the same name, and this prescription was added. Make sure you understand how and when to take each. Changed by: Felix Pacini   oxyCODONE-acetaminophen 10-325 MG tablet Commonly known as: PERCOCET Take 1 tablet by mouth every 8 (eight) hours as needed for pain. Start taking on: December 30, 2023 What changed: You were already taking a medication with the same name, and this prescription was added. Make sure you understand how and when to take each. Changed by: Felix Pacini   pantoprazole 40 MG tablet Commonly known as: PROTONIX Take 1 tablet (40 mg total) by mouth daily.   simvastatin 10 MG tablet Commonly known as: ZOCOR Take 1 tablet (10 mg total) by mouth every other day.       All past medical history, surgical history, allergies, family history, immunizations andmedications were updated in the EMR today and reviewed under the history and medication portions of their EMR.      MR SHOULDER RIGHT WO CONTRAST Result Date: 05/03/2022 IMPRESSION: 1. Postsurgical changes consistent with interval rotator cuff repair and probable bicipital tenodesis with superior labral debridement. Mild supraspinatus and infraspinatus tendinosis without recurrent rotator cuff tear or muscular atrophy. 2. Interval development of prominent subcortical cyst formation and surrounding edema inferiorly in the acromion with associated subacromial-subdeltoid bursitis. Electronically Signed   By: Carey Bullocks M.D.   On: 05/03/2022 13:24    ROS 14 pt review of systems performed and negative (unless mentioned in an HPI)  Objective: BP 116/80   Pulse 83   Temp 98.2 F (36.8 C)   Wt 191 lb 6.4 oz (86.8 kg)   SpO2 96%   BMI 25.25 kg/m  Physical  Exam Vitals and nursing note reviewed.  Constitutional:      General: He is not in acute distress.    Appearance: Normal appearance. He is not ill-appearing, toxic-appearing or diaphoretic.  HENT:     Head: Normocephalic and atraumatic.  Eyes:     General: No scleral icterus.       Right eye: No discharge.        Left eye: No discharge.     Extraocular Movements: Extraocular movements intact.     Pupils: Pupils are equal, round, and reactive to light.  Cardiovascular:     Rate and Rhythm: Normal rate and regular rhythm.     Heart sounds: No murmur heard. Pulmonary:     Effort: Pulmonary effort is normal. No respiratory distress.     Breath sounds: Normal breath sounds. No wheezing, rhonchi or rales.  Musculoskeletal:     Right lower leg: No edema.     Left lower leg: No edema.  Skin:    General: Skin is warm.     Findings: No rash.  Neurological:     Mental Status: He is alert and oriented to person, place, and time. Mental status is at baseline.  Psychiatric:        Mood and Affect: Mood normal.        Behavior:  Behavior normal.        Thought Content: Thought content normal.        Judgment: Judgment normal.     No results found.  Assessment/plan: Nicholas Graham is a 73 y.o. male present for chronic condition management Cervical neck pain with evidence of disc disease/Chronic midline thoracic back pain/Thoracic degenerative disc disease/DDD (degenerative disc disease), lumbar/History of lumbar discectomy He is following with sports medicine, physical therapy and orthopedics.  - current pain management is helping him maintain activity and improve his quality of life. -[place] controlled substance database reviewed and appropriate 11/21/23 - Pain contract signed > UTD - UDS > collected today NSAIDs contraindicated with gastritis Continue gabapentin  600/600/600> did not tolerate higher dose (headache) Continue baclofen 10-20 mg 3 times daily Continue percocet  10-325 mg tID #90 prescribed with 2 refills (held) at pharmacy.  - f/u 3 mos   Essential hypertension, benign/dyslipidemia/overweight Stable Continue amlodipine 10 mg daily  Continue HCTZ. Continue Zocor 10 mg q. OD - continue fish oil. - continue ASA 81 mg - routine diet and exercise. Labs up-to-date 06-2023   Gastroesophageal reflux disease with esophagitis Continue Protonix twice daily, Pepcid twice daily Avoid all NSAIDs.  Caution on spicy foods and alcohol use.   Anxiety/sleep disturbance:  Stable Continue lexapro 20 mg qd.   OSA on CPAP Compliant  Malignant neoplasm of upper lobe of left lung: Patient has surgery scheduled this month  Return in about 11 weeks (around 02/06/2024) for Routine chronic condition follow-up.   No orders of the defined types were placed in this encounter.  Meds ordered this encounter  Medications   lisinopril (ZESTRIL) 10 MG tablet    Sig: Take 1 tablet (10 mg total) by mouth daily.    Dispense:  90 tablet    Refill:  1   hydrochlorothiazide (HYDRODIURIL) 25 MG tablet    Sig: Take 1 tablet (25 mg total) by mouth daily.    Dispense:  90 tablet    Refill:  1   escitalopram (LEXAPRO) 20 MG tablet    Sig: Take 1 tablet (20 mg total) by mouth daily.    Dispense:  90 tablet    Refill:  1   baclofen (LIORESAL) 20 MG tablet    Sig: Take 0.5-1 tablets (10-20 mg total) by mouth 3 (three) times daily.    Dispense:  270 tablet    Refill:  1   amLODipine (NORVASC) 10 MG tablet    Sig: Take 1 tablet (10 mg total) by mouth daily.    Dispense:  90 tablet    Refill:  1   gabapentin (NEURONTIN) 600 MG tablet    Sig: Take 1 tablet (600 mg total) by mouth 3 (three) times daily.    Dispense:  270 tablet    Refill:  1   famotidine (PEPCID) 20 MG tablet    Sig: Take 1 tablet (20 mg total) by mouth 2 (two) times daily.    Dispense:  180 tablet    Refill:  1   pantoprazole (PROTONIX) 40 MG tablet    Sig: Take 1 tablet (40 mg total) by mouth daily.     Dispense:  90 tablet    Refill:  1   oxyCODONE-acetaminophen (PERCOCET) 10-325 MG tablet    Sig: Take 1 tablet by mouth every 8 (eight) hours as needed for pain.    Dispense:  90 tablet    Refill:  0   oxyCODONE-acetaminophen (PERCOCET) 10-325 MG tablet  Sig: Take 1 tablet by mouth every 8 (eight) hours as needed for pain.    Dispense:  90 tablet    Refill:  0   oxyCODONE-acetaminophen (PERCOCET) 10-325 MG tablet    Sig: Take 1 tablet by mouth every 8 (eight) hours as needed for pain.    Dispense:  90 tablet    Refill:  0   Referral Orders  No referral(s) requested today     Note is dictated utilizing voice recognition software. Although note has been proof read prior to signing, occasional typographical errors still can be missed. If any questions arise, please do not hesitate to call for verification.  Electronically signed by: Felix Pacini, DO [facility] [place]

## 2023-11-21 NOTE — Patient Instructions (Addendum)

## 2023-11-22 NOTE — Pre-Procedure Instructions (Signed)
 Surgical Instructions   Your procedure is scheduled on Wednesday, March 12th. Report to [facility] Main Entrance "A" at 06:30 A.M., then check in with the Admitting office. Any questions or running late day of surgery: call [phone]  Questions prior to your surgery date: call [phone], Monday-Friday, 8am-4pm. If you experience any cold or flu symptoms such as cough, fever, chills, shortness of breath, etc. between now and your scheduled surgery, please notify us at the above number.     Remember:  Do not eat or drink after midnight the night before your surgery    Take these medicines the morning of surgery with A SIP OF WATER  amLODipine (NORVASC)  baclofen (LIORESAL)  escitalopram (LEXAPRO)  famotidine (PEPCID)  gabapentin (NEURONTIN)  MYRBETRIQ  pantoprazole (PROTONIX)  simvastatin (ZOCOR)   May take these medicines IF NEEDED: oxyCODONE-acetaminophen (PERCOCET)    Do not take Aspirin the morning of surgery.  One week prior to surgery, STOP taking any Aleve, Naproxen, Ibuprofen, Motrin, Advil, Goody's, BC's, all herbal medications, fish oil, and non-prescription vitamins.                     Do NOT Smoke (Tobacco/Vaping) for 24 hours prior to your procedure.  If you use a CPAP at night, you may bring your mask/headgear for your overnight stay.   You will be asked to remove any contacts, glasses, piercing's, hearing aid's, dentures/partials prior to surgery. Please bring cases for these items if needed.    Patients discharged the day of surgery will not be allowed to drive home, and someone needs to stay with them for 24 hours.  SURGICAL WAITING ROOM VISITATION Patients may have no more than 2 support people in the waiting area - these visitors may rotate.   Pre-op nurse will coordinate an appropriate time for 1 ADULT support person, who may not rotate, to accompany patient in pre-op.  Children under the age of 52 must have an adult with them who is not the  patient and must remain in the main waiting area with an adult.  If the patient needs to stay at the hospital during part of their recovery, the visitor guidelines for inpatient rooms apply.  Please refer to the [facility] website for the visitor guidelines for any additional information.   If you received a COVID test during your pre-op visit  it is requested that you wear a mask when out in public, stay away from anyone that may not be feeling well and notify your surgeon if you develop symptoms. If you have been in contact with anyone that has tested positive in the last 10 days please notify you surgeon.      Pre-operative CHG Bathing Instructions   You can play a key role in reducing the risk of infection after surgery. Your skin needs to be as free of germs as possible. You can reduce the number of germs on your skin by washing with CHG (chlorhexidine gluconate) soap before surgery. CHG is an antiseptic soap that kills germs and continues to kill germs even after washing.   DO NOT use if you have an allergy to chlorhexidine/CHG or antibacterial soaps. If your skin becomes reddened or irritated, stop using the CHG and notify one of our RNs at [phone].              TAKE A SHOWER THE NIGHT BEFORE SURGERY AND THE DAY OF SURGERY    Please keep in mind the following:  DO  NOT shave, including legs and underarms, 48 hours prior to surgery.   You may shave your face before/day of surgery.  Place clean sheets on your bed the night before surgery Use a clean washcloth (not used since being washed) for each shower. DO NOT sleep with pet's night before surgery.  CHG Shower Instructions:  Wash your face and private area with normal soap. If you choose to wash your hair, wash first with your normal shampoo.  After you use shampoo/soap, rinse your hair and body thoroughly to remove shampoo/soap residue.  Turn the water OFF and apply half the bottle of CHG soap to a CLEAN washcloth.  Apply  CHG soap ONLY FROM YOUR NECK DOWN TO YOUR TOES (washing for 3-5 minutes)  DO NOT use CHG soap on face, private areas, open wounds, or sores.  Pay special attention to the area where your surgery is being performed.  If you are having back surgery, having someone wash your back for you may be helpful. Wait 2 minutes after CHG soap is applied, then you may rinse off the CHG soap.  Pat dry with a clean towel  Put on clean pajamas    Additional instructions for the day of surgery: DO NOT APPLY any lotions, deodorants, cologne, or perfumes.   Do not wear jewelry or makeup Do not wear nail polish, gel polish, artificial nails, or any other type of covering on natural nails (fingers and toes) Do not bring valuables to the hospital. [facility] is not responsible for valuables/personal belongings. Put on clean/comfortable clothes.  Please brush your teeth.  Ask your nurse before applying any prescription medications to the skin.

## 2023-11-25 ENCOUNTER — Other Ambulatory Visit: Payer: Self-pay

## 2023-11-25 ENCOUNTER — Encounter (HOSPITAL_COMMUNITY): Payer: Self-pay

## 2023-11-25 ENCOUNTER — Encounter (HOSPITAL_COMMUNITY)
Admission: RE | Admit: 2023-11-25 | Discharge: 2023-11-25 | Disposition: A | Discharge status: Home / Self Care | Payer: PPO | Source: Ambulatory Visit | Attending: Thoracic Surgery (Cardiothoracic Vascular Surgery)

## 2023-11-25 ENCOUNTER — Ambulatory Visit (HOSPITAL_COMMUNITY)
Admission: RE | Admit: 2023-11-25 | Discharge: 2023-11-25 | Disposition: A | Discharge status: Home / Self Care | Source: Ambulatory Visit | Attending: Thoracic Surgery (Cardiothoracic Vascular Surgery) | Admitting: Thoracic Surgery (Cardiothoracic Vascular Surgery)

## 2023-11-25 VITALS — BP 123/75 | HR 83 | Temp 97.8°F | Resp 17 | Ht 75.0 in | Wt 193.3 lb

## 2023-11-25 DIAGNOSIS — Z8249 Family history of ischemic heart disease and other diseases of the circulatory system: Secondary | ICD-10-CM | POA: Diagnosis not present

## 2023-11-25 DIAGNOSIS — Z860100 Personal history of colon polyps, unspecified: Secondary | ICD-10-CM | POA: Diagnosis not present

## 2023-11-25 DIAGNOSIS — J449 Chronic obstructive pulmonary disease, unspecified: Secondary | ICD-10-CM | POA: Diagnosis not present

## 2023-11-25 DIAGNOSIS — J90 Pleural effusion, not elsewhere classified: Secondary | ICD-10-CM | POA: Diagnosis not present

## 2023-11-25 DIAGNOSIS — C3412 Malignant neoplasm of upper lobe, left bronchus or lung: Secondary | ICD-10-CM

## 2023-11-25 DIAGNOSIS — E785 Hyperlipidemia, unspecified: Secondary | ICD-10-CM | POA: Diagnosis not present

## 2023-11-25 DIAGNOSIS — Z87891 Personal history of nicotine dependence: Secondary | ICD-10-CM | POA: Insufficient documentation

## 2023-11-25 DIAGNOSIS — I10 Essential (primary) hypertension: Secondary | ICD-10-CM | POA: Insufficient documentation

## 2023-11-25 DIAGNOSIS — Z801 Family history of malignant neoplasm of trachea, bronchus and lung: Secondary | ICD-10-CM | POA: Diagnosis not present

## 2023-11-25 DIAGNOSIS — Z01818 Encounter for other preprocedural examination: Secondary | ICD-10-CM

## 2023-11-25 DIAGNOSIS — I259 Chronic ischemic heart disease, unspecified: Secondary | ICD-10-CM | POA: Insufficient documentation

## 2023-11-25 DIAGNOSIS — K2100 Gastro-esophageal reflux disease with esophagitis, without bleeding: Secondary | ICD-10-CM | POA: Insufficient documentation

## 2023-11-25 DIAGNOSIS — I700 Atherosclerosis of aorta: Secondary | ICD-10-CM | POA: Insufficient documentation

## 2023-11-25 DIAGNOSIS — G4733 Obstructive sleep apnea (adult) (pediatric): Secondary | ICD-10-CM | POA: Insufficient documentation

## 2023-11-25 DIAGNOSIS — J9811 Atelectasis: Secondary | ICD-10-CM | POA: Diagnosis not present

## 2023-11-25 DIAGNOSIS — Z8616 Personal history of COVID-19: Secondary | ICD-10-CM | POA: Diagnosis not present

## 2023-11-25 LAB — TYPE AND SCREEN
ABO/RH(D): O POS
Antibody Screen: NEGATIVE

## 2023-11-25 LAB — CBC
HCT: 45.2 % (ref 39.0–52.0)
Hemoglobin: 15.5 g/dL (ref 13.0–17.0)
MCH: 31.6 pg (ref 26.0–34.0)
MCHC: 34.3 g/dL (ref 30.0–36.0)
MCV: 92.1 fL (ref 80.0–100.0)
Platelets: 178 10*3/uL (ref 150–400)
RBC: 4.91 MIL/uL (ref 4.22–5.81)
RDW: 13 % (ref 11.5–15.5)
WBC: 7.2 10*3/uL (ref 4.0–10.5)
nRBC: 0 % (ref 0.0–0.2)

## 2023-11-25 LAB — COMPREHENSIVE METABOLIC PANEL
ALT: 15 U/L (ref 0–44)
AST: 22 U/L (ref 15–41)
Albumin: 4.1 g/dL (ref 3.5–5.0)
Alkaline Phosphatase: 52 U/L (ref 38–126)
Anion gap: 8 (ref 5–15)
BUN: 15 mg/dL (ref 8–23)
CO2: 28 mmol/L (ref 22–32)
Calcium: 9.4 mg/dL (ref 8.9–10.3)
Chloride: 102 mmol/L (ref 98–111)
Creatinine, Ser: 0.93 mg/dL (ref 0.61–1.24)
GFR, Estimated: >60 mL/min (ref >60)
Glucose, Bld: 100 mg/dL — ABNORMAL HIGH (ref 70–99)
Potassium: 4.2 mmol/L (ref 3.5–5.1)
Sodium: 138 mmol/L (ref 135–145)
Total Bilirubin: 1.1 mg/dL (ref 0.0–1.2)
Total Protein: 6.7 g/dL (ref 6.5–8.1)

## 2023-11-25 LAB — URINALYSIS, ROUTINE W REFLEX MICROSCOPIC
Bilirubin Urine: NEGATIVE
Glucose, UA: NEGATIVE mg/dL
Hgb urine dipstick: NEGATIVE
Ketones, ur: NEGATIVE mg/dL
Leukocytes,Ua: NEGATIVE
Nitrite: NEGATIVE
Protein, ur: NEGATIVE mg/dL
Specific Gravity, Urine: 1.013 (ref 1.005–1.030)
pH: 5 (ref 5.0–8.0)

## 2023-11-25 LAB — PROTIME-INR
INR: 1 (ref 0.8–1.2)
Prothrombin Time: 13.1 s (ref 11.4–15.2)

## 2023-11-25 LAB — APTT: aPTT: 30 s (ref 24–36)

## 2023-11-25 LAB — SURGICAL PCR SCREEN
MRSA, PCR: NEGATIVE
Staphylococcus aureus: NEGATIVE

## 2023-11-25 NOTE — Progress Notes (Addendum)
 PCP - Natalia Leatherwood, DO  Cardiologist -   PPM/ICD -  denies Device Orders - n/a Rep Notified - n/a  Chest x-ray - 10-28-23 PFT- 11-15-23 EKG - 10-28-23 Stress Test -11-20-23  ECHO - 04-29-15 Cardiac Cath -   Sleep Study - 06-30-18 CPAP - use some nights per patient  DM - denies  Blood Thinner Instructions: Aspirin Instructions: Last dose 11-15-23 per patient  ERAS Protcol - NPO   COVID TEST- n/a   Anesthesia review: yes  Patient denies shortness of breath, fever, cough and chest pain at PAT appointment   All instructions explained to the patient, with a verbal understanding of the material. Patient agrees to go over the instructions while at home for a better understanding. Patient also instructed to self quarantine after being tested for COVID-19. The opportunity to ask questions was provided.

## 2023-11-26 NOTE — Progress Notes (Signed)
 Anesthesia Chart Review:  72 year old male with pertinent history including HTN, HLD, GERD with esophagitis (on Protonix and Pepcid), OSA on CPAP.  Recent incidental discovery discovery of a 3.6 cm left upper lobe nodule which was biopsied on 10/28/2023 by Dr. Delton Coombes and shown to be non-small cell lung cancer.  He has remote history of smoking, quit over 20 years ago.  PFTs 11/15/2023 showed FVC 86%, FEV1 95%, DLCO 91%.  He was seen by Dr. Cliffton Asters and recommended to undergo left upper lobectomy.  Dr. Cliffton Asters ordered preoperative nuclear stress test for restratification.  This was done 11/20/2023 and showed a very small area of ischemia in the basal inferoseptal wall, EF 61%, overall low risk.  Preop labs reviewed, WNL.  EKG 11/25/2023: NSR.  Rate 75.  CT chest 08/29/2023: IMPRESSION: No fracture is seen. Specifically, the right ribs are intact.   3.6 cm left upper lobe mass, new from the prior, raising concern for primary bronchogenic carcinoma, specifically invasive adenocarcinoma.   Aortic Atherosclerosis (ICD10-I70.0).  Nuclear stress 11/20/2023:   Findings are consistent with a very small area of ischemia in the basal inferoseptal wall. The study is low risk.   No ST deviation was noted.   LV perfusion is abnormal. Defect 1: There is a small defect with mild reduction in uptake present in the basal inferoseptal location(s) that is reversible. There is normal wall motion in the defect area. Consistent with ischemia.   Left ventricular function is normal. Nuclear stress EF: 61%. The left ventricular ejection fraction is normal (55-65%). End diastolic cavity size is normal.   Prior study not available for comparison.    Zannie Cove [facility] Short Stay Center/Anesthesiology Phone [phone] 11/26/2023 9:41 AM

## 2023-11-26 NOTE — Anesthesia Preprocedure Evaluation (Signed)
 Anesthesia Evaluation  Patient identified by MRN, date of birth, ID band Patient awake    Reviewed: Allergy & Precautions, NPO status , Patient's Chart, lab work & pertinent test results, reviewed documented beta blocker date and time   History of Anesthesia Complications Negative for: history of anesthetic complications  Airway Mallampati: II  TM Distance: >3 FB Neck ROM: Full    Dental no notable dental hx.    Pulmonary neg shortness of breath, sleep apnea and Continuous Positive Airway Pressure Ventilation , COPD, former smoker   breath sounds clear to auscultation       Cardiovascular hypertension, (-) CAD, (-) Past MI, (-) Cardiac Stents and (-) CABG (-) pacemaker(-) Cardiac Defibrillator (-) Valvular Problems/Murmurs Rhythm:Regular Rate:Normal     Neuro/Psych neg Seizures  Anxiety      Neuromuscular disease    GI/Hepatic ,GERD  ,,(+) neg Cirrhosis        Endo/Other    Renal/GU Renal disease     Musculoskeletal  (+) Arthritis ,    Abdominal   Peds  Hematology   Anesthesia Other Findings   Reproductive/Obstetrics                              Anesthesia Physical Anesthesia Plan  ASA: 2  Anesthesia Plan: General   Post-op Pain Management:    Induction: Intravenous  PONV Risk Score and Plan: 2 and Ondansetron and Dexamethasone  Airway Management Planned: Double Lumen EBT  Additional Equipment: ClearSight  Intra-op Plan:   Post-operative Plan: Extubation in OR  Informed Consent: I have reviewed the patients History and Physical, chart, labs and discussed the procedure including the risks, benefits and alternatives for the proposed anesthesia with the patient or authorized representative who has indicated his/her understanding and acceptance.     Dental advisory given  Plan Discussed with: CRNA  Anesthesia Plan Comments: (PAT note by Antionette Poles, PA-C: 73 year old  male with pertinent history including HTN, HLD, GERD with esophagitis (on Protonix and Pepcid), OSA on CPAP.  Recent incidental discovery discovery of a 3.6 cm left upper lobe nodule which was biopsied on 10/28/2023 by Dr. Delton Coombes and shown to be non-small cell lung cancer.  He has remote history of smoking, quit over 20 years ago.  PFTs 11/15/2023 showed FVC 86%, FEV1 95%, DLCO 91%.  He was seen by Dr. Cliffton Asters and recommended to undergo left upper lobectomy.  Dr. Cliffton Asters ordered preoperative nuclear stress test for restratification.  This was done 11/20/2023 and showed a very small area of ischemia in the basal inferoseptal wall, EF 61%, overall low risk.  Preop labs reviewed, WNL.  EKG 11/25/2023: NSR.  Rate 75.  CT chest 08/29/2023: IMPRESSION: No fracture is seen. Specifically, the right ribs are intact.   3.6 cm left upper lobe mass, new from the prior, raising concern for primary bronchogenic carcinoma, specifically invasive adenocarcinoma.   Aortic Atherosclerosis (ICD10-I70.0).  Nuclear stress 11/20/2023:   Findings are consistent with a very small area of ischemia in the basal inferoseptal wall. The study is low risk.   No ST deviation was noted.   LV perfusion is abnormal. Defect 1: There is a small defect with mild reduction in uptake present in the basal inferoseptal location(s) that is reversible. There is normal wall motion in the defect area. Consistent with ischemia.   Left ventricular function is normal. Nuclear stress EF: 61%. The left ventricular ejection fraction is normal (55-65%). End diastolic cavity size  is normal.   Prior study not available for comparison.  )         Anesthesia Quick Evaluation

## 2023-11-27 ENCOUNTER — Inpatient Hospital Stay (HOSPITAL_COMMUNITY)

## 2023-11-27 ENCOUNTER — Inpatient Hospital Stay (HOSPITAL_COMMUNITY): Payer: Self-pay

## 2023-11-27 ENCOUNTER — Ambulatory Visit: Payer: PPO | Admitting: Emergency Medicine

## 2023-11-27 ENCOUNTER — Other Ambulatory Visit: Payer: Self-pay

## 2023-11-27 ENCOUNTER — Inpatient Hospital Stay (HOSPITAL_COMMUNITY): Payer: Self-pay | Admitting: Physician Assistant

## 2023-11-27 ENCOUNTER — Encounter (HOSPITAL_COMMUNITY): Payer: Self-pay | Admitting: Thoracic Surgery (Cardiothoracic Vascular Surgery)

## 2023-11-27 ENCOUNTER — Encounter (HOSPITAL_COMMUNITY)
Admission: RE | Disposition: A | Discharge status: Home / Self Care | Payer: Self-pay | Source: Home / Self Care | Attending: Thoracic Surgery (Cardiothoracic Vascular Surgery)

## 2023-11-27 ENCOUNTER — Inpatient Hospital Stay (HOSPITAL_COMMUNITY)
Admission: RE | Admit: 2023-11-27 | Discharge: 2023-11-28 | DRG: 164 | Disposition: A | Discharge status: Home / Self Care | Payer: PPO | Attending: Thoracic Surgery (Cardiothoracic Vascular Surgery) | Admitting: Thoracic Surgery (Cardiothoracic Vascular Surgery)

## 2023-11-27 DIAGNOSIS — C3492 Malignant neoplasm of unspecified part of left bronchus or lung: Secondary | ICD-10-CM | POA: Diagnosis not present

## 2023-11-27 DIAGNOSIS — G4733 Obstructive sleep apnea (adult) (pediatric): Secondary | ICD-10-CM | POA: Diagnosis not present

## 2023-11-27 DIAGNOSIS — R300 Dysuria: Secondary | ICD-10-CM | POA: Diagnosis not present

## 2023-11-27 DIAGNOSIS — Z7982 Long term (current) use of aspirin: Secondary | ICD-10-CM

## 2023-11-27 DIAGNOSIS — J449 Chronic obstructive pulmonary disease, unspecified: Secondary | ICD-10-CM | POA: Diagnosis not present

## 2023-11-27 DIAGNOSIS — E785 Hyperlipidemia, unspecified: Secondary | ICD-10-CM | POA: Diagnosis present

## 2023-11-27 DIAGNOSIS — C3490 Malignant neoplasm of unspecified part of unspecified bronchus or lung: Secondary | ICD-10-CM

## 2023-11-27 DIAGNOSIS — Z87891 Personal history of nicotine dependence: Secondary | ICD-10-CM | POA: Diagnosis not present

## 2023-11-27 DIAGNOSIS — Z4682 Encounter for fitting and adjustment of non-vascular catheter: Secondary | ICD-10-CM | POA: Diagnosis not present

## 2023-11-27 DIAGNOSIS — Z888 Allergy status to other drugs, medicaments and biological substances status: Secondary | ICD-10-CM | POA: Diagnosis not present

## 2023-11-27 DIAGNOSIS — Z8249 Family history of ischemic heart disease and other diseases of the circulatory system: Secondary | ICD-10-CM | POA: Diagnosis not present

## 2023-11-27 DIAGNOSIS — M549 Dorsalgia, unspecified: Secondary | ICD-10-CM | POA: Diagnosis present

## 2023-11-27 DIAGNOSIS — I10 Essential (primary) hypertension: Secondary | ICD-10-CM | POA: Diagnosis not present

## 2023-11-27 DIAGNOSIS — J90 Pleural effusion, not elsewhere classified: Secondary | ICD-10-CM | POA: Diagnosis not present

## 2023-11-27 DIAGNOSIS — R599 Enlarged lymph nodes, unspecified: Secondary | ICD-10-CM | POA: Diagnosis present

## 2023-11-27 DIAGNOSIS — Z801 Family history of malignant neoplasm of trachea, bronchus and lung: Secondary | ICD-10-CM

## 2023-11-27 DIAGNOSIS — Z8616 Personal history of COVID-19: Secondary | ICD-10-CM

## 2023-11-27 DIAGNOSIS — Z79899 Other long term (current) drug therapy: Secondary | ICD-10-CM | POA: Diagnosis not present

## 2023-11-27 DIAGNOSIS — C3412 Malignant neoplasm of upper lobe, left bronchus or lung: Secondary | ICD-10-CM | POA: Diagnosis not present

## 2023-11-27 DIAGNOSIS — Z880 Allergy status to penicillin: Secondary | ICD-10-CM

## 2023-11-27 DIAGNOSIS — J9811 Atelectasis: Secondary | ICD-10-CM | POA: Diagnosis not present

## 2023-11-27 DIAGNOSIS — Z48813 Encounter for surgical aftercare following surgery on the respiratory system: Secondary | ICD-10-CM | POA: Diagnosis not present

## 2023-11-27 DIAGNOSIS — R309 Painful micturition, unspecified: Secondary | ICD-10-CM | POA: Diagnosis present

## 2023-11-27 DIAGNOSIS — K219 Gastro-esophageal reflux disease without esophagitis: Secondary | ICD-10-CM | POA: Diagnosis not present

## 2023-11-27 DIAGNOSIS — R911 Solitary pulmonary nodule: Secondary | ICD-10-CM | POA: Diagnosis not present

## 2023-11-27 DIAGNOSIS — Z01818 Encounter for other preprocedural examination: Secondary | ICD-10-CM | POA: Diagnosis not present

## 2023-11-27 DIAGNOSIS — I454 Nonspecific intraventricular block: Secondary | ICD-10-CM | POA: Diagnosis not present

## 2023-11-27 DIAGNOSIS — Z860100 Personal history of colon polyps, unspecified: Secondary | ICD-10-CM

## 2023-11-27 DIAGNOSIS — G8929 Other chronic pain: Secondary | ICD-10-CM | POA: Diagnosis present

## 2023-11-27 DIAGNOSIS — Q859 Phakomatosis, unspecified: Secondary | ICD-10-CM | POA: Diagnosis not present

## 2023-11-27 DIAGNOSIS — Z902 Acquired absence of lung [part of]: Principal | ICD-10-CM

## 2023-11-27 HISTORY — PX: LYMPH NODE BIOPSY: SHX201

## 2023-11-27 HISTORY — PX: INTERCOSTAL NERVE BLOCK: SHX5021

## 2023-11-27 LAB — FUNGUS CULTURE WITH STAIN

## 2023-11-27 LAB — FUNGAL ORGANISM REFLEX

## 2023-11-27 LAB — ABO/RH: ABO/RH(D): O POS

## 2023-11-27 LAB — FUNGUS CULTURE RESULT

## 2023-11-27 SURGERY — LOBECTOMY, LUNG, ROBOT-ASSISTED, USING VATS
Anesthesia: General | Site: Chest | Laterality: Left

## 2023-11-27 MED ORDER — ROCURONIUM BROMIDE 10 MG/ML (PF) SYRINGE
PREFILLED_SYRINGE | INTRAVENOUS | Status: AC
  Filled 2023-11-27: qty 10

## 2023-11-27 MED ORDER — HYDROCHLOROTHIAZIDE 25 MG PO TABS: 25.0000 mg | ORAL_TABLET | Freq: Every day | ORAL | Status: DC

## 2023-11-27 MED ORDER — FENTANYL CITRATE (PF) 100 MCG/2ML IJ SOLN
25.0000 ug | INTRAMUSCULAR | Status: DC | PRN
  Administered 2023-11-27 (×4): 25 ug via INTRAVENOUS

## 2023-11-27 MED ORDER — GLYCOPYRROLATE PF 0.2 MG/ML IJ SOSY
PREFILLED_SYRINGE | INTRAMUSCULAR | Status: AC
  Filled 2023-11-27: qty 1

## 2023-11-27 MED ORDER — ACETAMINOPHEN 10 MG/ML IV SOLN
INTRAVENOUS | Status: DC | PRN
  Administered 2023-11-27: 1000 mg via INTRAVENOUS

## 2023-11-27 MED ORDER — FENTANYL CITRATE (PF) 100 MCG/2ML IJ SOLN
INTRAMUSCULAR | Status: AC
  Filled 2023-11-27: qty 2

## 2023-11-27 MED ORDER — ORAL CARE MOUTH RINSE: 15.0000 mL | Freq: Once | OROMUCOSAL | Status: AC

## 2023-11-27 MED ORDER — FENTANYL CITRATE (PF) 250 MCG/5ML IJ SOLN
INTRAMUSCULAR | Status: AC
  Filled 2023-11-27: qty 5

## 2023-11-27 MED ORDER — FENTANYL CITRATE (PF) 250 MCG/5ML IJ SOLN
INTRAMUSCULAR | Status: DC | PRN
  Administered 2023-11-27: 50 ug via INTRAVENOUS
  Administered 2023-11-27: 100 ug via INTRAVENOUS
  Administered 2023-11-27 (×2): 50 ug via INTRAVENOUS

## 2023-11-27 MED ORDER — PHENYLEPHRINE 80 MCG/ML (10ML) SYRINGE FOR IV PUSH (FOR BLOOD PRESSURE SUPPORT)
PREFILLED_SYRINGE | INTRAVENOUS | Status: AC
  Filled 2023-11-27: qty 10

## 2023-11-27 MED ORDER — ACETAMINOPHEN 160 MG/5ML PO SOLN: 1000.0000 mg | Freq: Four times a day (QID) | ORAL | Status: DC

## 2023-11-27 MED ORDER — ROCURONIUM BROMIDE 100 MG/10ML IV SOLN
INTRAVENOUS | Status: DC | PRN
Start: 2023-11-27 — End: 2023-11-27
  Administered 2023-11-27: 100 mg via INTRAVENOUS
  Administered 2023-11-27: 30 mg via INTRAVENOUS
  Administered 2023-11-27: 10 mg via INTRAVENOUS

## 2023-11-27 MED ORDER — ESCITALOPRAM OXALATE 10 MG PO TABS
20.0000 mg | ORAL_TABLET | Freq: Every day | ORAL | Status: DC
  Administered 2023-11-28: 20 mg via ORAL
  Filled 2023-11-27: qty 2

## 2023-11-27 MED ORDER — BISACODYL 5 MG PO TBEC
10.0000 mg | DELAYED_RELEASE_TABLET | Freq: Every day | ORAL | Status: DC
  Filled 2023-11-27: qty 2

## 2023-11-27 MED ORDER — BUPIVACAINE LIPOSOME 1.3 % IJ SUSP
INTRAMUSCULAR | Status: AC
  Filled 2023-11-27: qty 20

## 2023-11-27 MED ORDER — PHENYLEPHRINE 80 MCG/ML (10ML) SYRINGE FOR IV PUSH (FOR BLOOD PRESSURE SUPPORT)
PREFILLED_SYRINGE | INTRAVENOUS | Status: DC | PRN
  Administered 2023-11-27 (×2): 160 ug via INTRAVENOUS

## 2023-11-27 MED ORDER — BACLOFEN 10 MG PO TABS
10.0000 mg | ORAL_TABLET | Freq: Three times a day (TID) | ORAL | Status: DC
  Administered 2023-11-27 – 2023-11-28 (×3): 10 mg via ORAL
  Filled 2023-11-27 (×3): qty 1

## 2023-11-27 MED ORDER — ALBUMIN HUMAN 5 % IV SOLN: INTRAVENOUS | Status: DC | PRN

## 2023-11-27 MED ORDER — 0.9 % SODIUM CHLORIDE (POUR BTL) OPTIME
TOPICAL | Status: DC | PRN
  Administered 2023-11-27: 2000 mL

## 2023-11-27 MED ORDER — ONDANSETRON HCL 4 MG/2ML IJ SOLN: 4.0000 mg | Freq: Once | INTRAMUSCULAR | Status: DC | PRN

## 2023-11-27 MED ORDER — CHLORHEXIDINE GLUCONATE 0.12 % MT SOLN
15.0000 mL | Freq: Once | OROMUCOSAL | Status: AC
  Administered 2023-11-27: 15 mL via OROMUCOSAL
  Filled 2023-11-27: qty 15

## 2023-11-27 MED ORDER — GABAPENTIN 300 MG PO CAPS
600.0000 mg | ORAL_CAPSULE | Freq: Three times a day (TID) | ORAL | Status: DC
  Administered 2023-11-27: 300 mg via ORAL
  Filled 2023-11-27 (×3): qty 2

## 2023-11-27 MED ORDER — OXYCODONE HCL 5 MG PO TABS: 5.0000 mg | ORAL_TABLET | Freq: Once | ORAL | Status: DC | PRN

## 2023-11-27 MED ORDER — SODIUM CHLORIDE FLUSH 0.9 % IV SOLN
INTRAVENOUS | Status: DC | PRN
  Administered 2023-11-27: 100 mL

## 2023-11-27 MED ORDER — MORPHINE SULFATE (PF) 2 MG/ML IV SOLN: 2.0000 mg | INTRAVENOUS | Status: DC | PRN

## 2023-11-27 MED ORDER — AMLODIPINE BESYLATE 10 MG PO TABS: 10.0000 mg | ORAL_TABLET | Freq: Every day | ORAL | Status: DC

## 2023-11-27 MED ORDER — VANCOMYCIN HCL IN DEXTROSE 1-5 GM/200ML-% IV SOLN
1000.0000 mg | INTRAVENOUS | Status: AC
  Administered 2023-11-27: 1000 mg via INTRAVENOUS
  Filled 2023-11-27: qty 200

## 2023-11-27 MED ORDER — MIDAZOLAM HCL 2 MG/2ML IJ SOLN
INTRAMUSCULAR | Status: DC | PRN
  Administered 2023-11-27: 2 mg via INTRAVENOUS

## 2023-11-27 MED ORDER — PANTOPRAZOLE SODIUM 40 MG PO TBEC
40.0000 mg | DELAYED_RELEASE_TABLET | Freq: Every day | ORAL | Status: DC
  Administered 2023-11-28: 40 mg via ORAL
  Filled 2023-11-27: qty 1

## 2023-11-27 MED ORDER — OXYCODONE HCL 5 MG/5ML PO SOLN: 5.0000 mg | Freq: Once | ORAL | Status: DC | PRN

## 2023-11-27 MED ORDER — METOCLOPRAMIDE HCL 5 MG/ML IJ SOLN
10.0000 mg | Freq: Four times a day (QID) | INTRAMUSCULAR | Status: AC
  Administered 2023-11-27 – 2023-11-28 (×4): 10 mg via INTRAVENOUS
  Filled 2023-11-27 (×4): qty 2

## 2023-11-27 MED ORDER — CEFAZOLIN SODIUM-DEXTROSE 2-4 GM/100ML-% IV SOLN: 2.0000 g | Freq: Three times a day (TID) | INTRAVENOUS | Status: DC

## 2023-11-27 MED ORDER — PHENYLEPHRINE HCL (PRESSORS) 10 MG/ML IV SOLN
INTRAVENOUS | Status: DC | PRN
Start: 2023-11-27 — End: 2023-11-27
  Administered 2023-11-27 (×2): 80 ug via INTRAVENOUS

## 2023-11-27 MED ORDER — VANCOMYCIN HCL IN DEXTROSE 1-5 GM/200ML-% IV SOLN
1000.0000 mg | Freq: Two times a day (BID) | INTRAVENOUS | Status: AC
  Administered 2023-11-27 – 2023-11-28 (×2): 1000 mg via INTRAVENOUS
  Filled 2023-11-27 (×2): qty 200

## 2023-11-27 MED ORDER — EPHEDRINE SULFATE-NACL 50-0.9 MG/10ML-% IV SOSY
PREFILLED_SYRINGE | INTRAVENOUS | Status: DC | PRN
  Administered 2023-11-27 (×2): 10 mg via INTRAVENOUS

## 2023-11-27 MED ORDER — SUGAMMADEX SODIUM 200 MG/2ML IV SOLN
INTRAVENOUS | Status: AC
  Filled 2023-11-27: qty 2

## 2023-11-27 MED ORDER — EPHEDRINE 5 MG/ML INJ
INTRAVENOUS | Status: AC
  Filled 2023-11-27: qty 5

## 2023-11-27 MED ORDER — TRAMADOL HCL 50 MG PO TABS
50.0000 mg | ORAL_TABLET | Freq: Four times a day (QID) | ORAL | Status: DC | PRN
Start: 2023-11-27 — End: 2023-11-28
  Administered 2023-11-28: 100 mg via ORAL
  Filled 2023-11-27: qty 2

## 2023-11-27 MED ORDER — PHENYLEPHRINE HCL-NACL 20-0.9 MG/250ML-% IV SOLN
INTRAVENOUS | Status: DC | PRN
  Administered 2023-11-27: 20 ug/min via INTRAVENOUS

## 2023-11-27 MED ORDER — ONDANSETRON HCL 4 MG/2ML IJ SOLN: 4.0000 mg | Freq: Four times a day (QID) | INTRAMUSCULAR | Status: DC | PRN

## 2023-11-27 MED ORDER — DEXAMETHASONE SODIUM PHOSPHATE 10 MG/ML IJ SOLN
INTRAMUSCULAR | Status: DC | PRN
  Administered 2023-11-27: 10 mg via INTRAVENOUS

## 2023-11-27 MED ORDER — SENNOSIDES-DOCUSATE SODIUM 8.6-50 MG PO TABS: 1.0000 | ORAL_TABLET | Freq: Every day | ORAL | Status: DC

## 2023-11-27 MED ORDER — ASPIRIN 81 MG PO TBEC
81.0000 mg | DELAYED_RELEASE_TABLET | Freq: Every day | ORAL | Status: DC
  Administered 2023-11-28: 81 mg via ORAL
  Filled 2023-11-27: qty 1

## 2023-11-27 MED ORDER — ONDANSETRON HCL 4 MG/2ML IJ SOLN
INTRAMUSCULAR | Status: DC | PRN
  Administered 2023-11-27: 4 mg via INTRAVENOUS

## 2023-11-27 MED ORDER — LACTATED RINGERS IV SOLN: INTRAVENOUS | Status: DC

## 2023-11-27 MED ORDER — SUGAMMADEX SODIUM 200 MG/2ML IV SOLN
INTRAVENOUS | Status: DC | PRN
  Administered 2023-11-27: 200 mg via INTRAVENOUS

## 2023-11-27 MED ORDER — ACETAMINOPHEN 10 MG/ML IV SOLN
INTRAVENOUS | Status: AC
  Filled 2023-11-27: qty 100

## 2023-11-27 MED ORDER — PROPOFOL 10 MG/ML IV BOLUS
INTRAVENOUS | Status: DC | PRN
  Administered 2023-11-27: 120 mg via INTRAVENOUS

## 2023-11-27 MED ORDER — LIDOCAINE 2% (20 MG/ML) 5 ML SYRINGE
INTRAMUSCULAR | Status: AC
  Filled 2023-11-27: qty 5

## 2023-11-27 MED ORDER — LIDOCAINE HCL (CARDIAC) PF 100 MG/5ML IV SOSY
PREFILLED_SYRINGE | INTRAVENOUS | Status: DC | PRN
Start: 2023-11-27 — End: 2023-11-27
  Administered 2023-11-27: 100 mg via INTRATRACHEAL

## 2023-11-27 MED ORDER — DEXAMETHASONE SODIUM PHOSPHATE 10 MG/ML IJ SOLN
INTRAMUSCULAR | Status: AC
  Filled 2023-11-27: qty 1

## 2023-11-27 MED ORDER — OXYCODONE HCL 5 MG PO TABS
10.0000 mg | ORAL_TABLET | ORAL | Status: DC | PRN
  Administered 2023-11-27 – 2023-11-28 (×2): 10 mg via ORAL
  Filled 2023-11-27 (×2): qty 2

## 2023-11-27 MED ORDER — LISINOPRIL 20 MG PO TABS: 10.0000 mg | ORAL_TABLET | Freq: Every day | ORAL | Status: DC

## 2023-11-27 MED ORDER — PANTOPRAZOLE SODIUM 40 MG PO TBEC: 40.0000 mg | DELAYED_RELEASE_TABLET | Freq: Every day | ORAL | Status: DC

## 2023-11-27 MED ORDER — BUPIVACAINE HCL (PF) 0.5 % IJ SOLN
INTRAMUSCULAR | Status: AC
  Filled 2023-11-27: qty 30

## 2023-11-27 MED ORDER — PROPOFOL 10 MG/ML IV BOLUS
INTRAVENOUS | Status: AC
  Filled 2023-11-27: qty 20

## 2023-11-27 MED ORDER — ACETAMINOPHEN 500 MG PO TABS
1000.0000 mg | ORAL_TABLET | Freq: Four times a day (QID) | ORAL | Status: DC
  Administered 2023-11-27 – 2023-11-28 (×4): 1000 mg via ORAL
  Filled 2023-11-27 (×4): qty 2

## 2023-11-27 MED ORDER — MIDAZOLAM HCL 2 MG/2ML IJ SOLN
INTRAMUSCULAR | Status: AC
  Filled 2023-11-27: qty 2

## 2023-11-27 MED ORDER — ONDANSETRON HCL 4 MG/2ML IJ SOLN
INTRAMUSCULAR | Status: AC
Start: 2023-11-27 — End: ?
  Filled 2023-11-27: qty 2

## 2023-11-27 MED ORDER — SIMVASTATIN 20 MG PO TABS
10.0000 mg | ORAL_TABLET | ORAL | Status: DC
  Administered 2023-11-28: 10 mg via ORAL
  Filled 2023-11-27: qty 1

## 2023-11-27 MED ORDER — ACETAMINOPHEN 10 MG/ML IV SOLN: 1000.0000 mg | Freq: Once | INTRAVENOUS | Status: DC | PRN

## 2023-11-27 MED ORDER — MIRABEGRON ER 50 MG PO TB24
50.0000 mg | ORAL_TABLET | Freq: Every day | ORAL | Status: DC
  Administered 2023-11-28: 50 mg via ORAL
  Filled 2023-11-27: qty 1

## 2023-11-27 MED ORDER — ENOXAPARIN SODIUM 40 MG/0.4ML IJ SOSY
40.0000 mg | PREFILLED_SYRINGE | Freq: Every day | INTRAMUSCULAR | Status: DC
  Administered 2023-11-28: 40 mg via SUBCUTANEOUS
  Filled 2023-11-27: qty 0.4

## 2023-11-27 SURGICAL SUPPLY — 85 items
BLADE CLIPPER SURG (BLADE) ×2 IMPLANT
BLADE SURG 11 STRL SS (BLADE) ×2 IMPLANT
CANISTER SUCT 3000ML PPV (MISCELLANEOUS) ×4 IMPLANT
CANNULA REDUCER 12-8 DVNC XI (CANNULA) ×4 IMPLANT
CATH THORACIC 28FR (CATHETERS) ×2 IMPLANT
CHLORAPREP W/TINT 26 (MISCELLANEOUS) ×2 IMPLANT
CLIP TI MEDIUM 6 (CLIP) IMPLANT
CNTNR URN SCR LID CUP LEK RST (MISCELLANEOUS) ×10 IMPLANT
CONN ST 1/4X3/8 BEN (MISCELLANEOUS) IMPLANT
DEFOGGER SCOPE WARMER CLEARIFY (MISCELLANEOUS) ×2 IMPLANT
DERMABOND ADVANCED .7 DNX12 (GAUZE/BANDAGES/DRESSINGS) ×2 IMPLANT
DRAIN CHANNEL 28F RND 3/8 FF (WOUND CARE) IMPLANT
DRAPE ARM DVNC X/XI (DISPOSABLE) ×8 IMPLANT
DRAPE COLUMN DVNC XI (DISPOSABLE) ×2 IMPLANT
DRAPE CV SPLIT W-CLR ANES SCRN (DRAPES) ×2 IMPLANT
DRAPE SURG ORHT 6 SPLT 77X108 (DRAPES) ×2 IMPLANT
ELECT BLADE 6.5 EXT (BLADE) IMPLANT
ELECT REM PT RETURN 9FT ADLT (ELECTROSURGICAL) ×2 IMPLANT
ELECTRODE REM PT RTRN 9FT ADLT (ELECTROSURGICAL) ×2 IMPLANT
FORCEPS BPLR LNG DVNC XI (INSTRUMENTS) IMPLANT
FORCEPS CADIERE DVNC XI (FORCEP) IMPLANT
GAUZE KITTNER 4X8 (MISCELLANEOUS) ×2 IMPLANT
GAUZE SPONGE 4X4 12PLY STRL (GAUZE/BANDAGES/DRESSINGS) ×2 IMPLANT
GLOVE BIO SURGEON STRL SZ7.5 (GLOVE) ×4 IMPLANT
GLOVE SURG POLYISO LF SZ8 (GLOVE) ×2 IMPLANT
GOWN STRL REUS W/ TWL LRG LVL3 (GOWN DISPOSABLE) ×4 IMPLANT
GOWN STRL REUS W/ TWL XL LVL3 (GOWN DISPOSABLE) ×4 IMPLANT
GOWN STRL REUS W/TWL 2XL LVL3 (GOWN DISPOSABLE) ×2 IMPLANT
GRASPER TIP-UP FEN DVNC XI (INSTRUMENTS) IMPLANT
HEMOSTAT SURGICEL 2X14 (HEMOSTASIS) ×2 IMPLANT
IRRIGATION STRYKERFLOW (MISCELLANEOUS) IMPLANT
IRRIGATOR STRYKERFLOW (MISCELLANEOUS) IMPLANT
KIT BASIN OR (CUSTOM PROCEDURE TRAY) ×2 IMPLANT
KIT TURNOVER KIT B (KITS) ×2 IMPLANT
NDL 22X1.5 STRL (OR ONLY) (MISCELLANEOUS) ×2 IMPLANT
NEEDLE 22X1.5 STRL (OR ONLY) (MISCELLANEOUS) ×2 IMPLANT
NS IRRIG 1000ML POUR BTL (IV SOLUTION) ×6 IMPLANT
PACK CHEST (CUSTOM PROCEDURE TRAY) ×2 IMPLANT
PAD ARMBOARD 7.5X6 YLW CONV (MISCELLANEOUS) ×10 IMPLANT
RELOAD STAPLE 45 2.0 GRY DVNC (STAPLE) IMPLANT
RELOAD STAPLE 45 2.5 WHT DVNC (STAPLE) IMPLANT
RELOAD STAPLE 45 3.5 BLU DVNC (STAPLE) IMPLANT
RELOAD STAPLE 45 4.3 GRN DVNC (STAPLE) IMPLANT
RELOAD STAPLER 2.5X45 WHT DVNC (STAPLE) ×8 IMPLANT
RELOAD STAPLER 3.5X45 BLU DVNC (STAPLE) ×4 IMPLANT
RELOAD STAPLER 4.3X45 GRN DVNC (STAPLE) ×4 IMPLANT
SCISSORS LAP 5X35 DISP (ENDOMECHANICALS) IMPLANT
SEAL UNIV 5-12 XI (MISCELLANEOUS) ×8 IMPLANT
SEALANT PROGEL (MISCELLANEOUS) IMPLANT
SEALER LIGASURE MARYLAND 30 (ELECTROSURGICAL) IMPLANT
SET TRI-LUMEN FLTR TB AIRSEAL (TUBING) ×2 IMPLANT
SOL ELECTROSURG ANTI STICK (MISCELLANEOUS) IMPLANT
SOLUTION ELECTROSURG ANTI STCK (MISCELLANEOUS) IMPLANT
SPONGE INTESTINAL PEANUT (DISPOSABLE) IMPLANT
SPONGE TONSIL 1 RF SGL (DISPOSABLE) IMPLANT
STAPLE RELOAD 45 2.0 GRAY DVNC (STAPLE) ×2 IMPLANT
STAPLER 45 SUREFORM CVD DVNC (STAPLE) IMPLANT
STAPLER RELOAD 2.5X45 WHT DVNC (STAPLE) ×8 IMPLANT
STAPLER RELOAD 3.5X45 BLU DVNC (STAPLE) ×4 IMPLANT
STAPLER RELOAD 4.3X45 GRN DVNC (STAPLE) ×4 IMPLANT
STOPCOCK 4 WAY LG BORE MALE ST (IV SETS) ×2 IMPLANT
SUT MNCRL AB 3-0 PS2 18 (SUTURE) IMPLANT
SUT MON AB 2-0 CT1 36 (SUTURE) IMPLANT
SUT PDS AB 1 CTX 36 (SUTURE) IMPLANT
SUT PROLENE 4-0 RB1 .5 CRCL 36 (SUTURE) IMPLANT
SUT SILK 1 MH (SUTURE) ×2 IMPLANT
SUT SILK 1 TIES 10X30 (SUTURE) IMPLANT
SUT SILK 2 0 SH (SUTURE) IMPLANT
SUT SILK 2 0SH CR/8 30 (SUTURE) IMPLANT
SUT VIC AB 1 CTX36XBRD ANBCTR (SUTURE) IMPLANT
SUT VIC AB 2-0 CT1 TAPERPNT 27 (SUTURE) ×2 IMPLANT
SUT VIC AB 3-0 SH 27X BRD (SUTURE) ×4 IMPLANT
SUT VICRYL 0 TIES 12 18 (SUTURE) ×2 IMPLANT
SUT VICRYL 0 UR6 27IN ABS (SUTURE) ×4 IMPLANT
SUT VICRYL 2 TP 1 (SUTURE) IMPLANT
SYR 10ML LL (SYRINGE) ×2 IMPLANT
SYR 20ML LL LF (SYRINGE) ×2 IMPLANT
SYR 50ML LL SCALE MARK (SYRINGE) ×2 IMPLANT
SYSTEM RETRIEVAL ANCHOR 15 (MISCELLANEOUS) IMPLANT
SYSTEM SAHARA CHEST DRAIN ATS (WOUND CARE) ×2 IMPLANT
TAPE CLOTH 4X10 WHT NS (GAUZE/BANDAGES/DRESSINGS) ×2 IMPLANT
TIP APPLICATOR SPRAY EXTEND 16 (VASCULAR PRODUCTS) IMPLANT
TOWEL GREEN STERILE (TOWEL DISPOSABLE) ×2 IMPLANT
TRAY FOLEY MTR SLVR 16FR STAT (SET/KITS/TRAYS/PACK) ×2 IMPLANT
TUBING EXTENTION W/L.L. (IV SETS) ×2 IMPLANT

## 2023-11-27 NOTE — Plan of Care (Signed)
  Problem: Education: Goal: Knowledge of General Education information will improve Description: Including pain rating scale, medication(s)/side effects and non-pharmacologic comfort measures Outcome: Progressing   Problem: Health Behavior/Discharge Planning: Goal: Ability to manage health-related needs will improve Outcome: Progressing   Problem: Clinical Measurements: Goal: Will remain free from infection Outcome: Progressing Goal: Respiratory complications will improve Outcome: Progressing Goal: Cardiovascular complication will be avoided Outcome: Progressing   Problem: Nutrition: Goal: Adequate nutrition will be maintained Outcome: Progressing   Problem: Elimination: Goal: Will not experience complications related to urinary retention Outcome: Progressing   Problem: Safety: Goal: Ability to remain free from injury will improve Outcome: Progressing   Problem: Coping: Goal: Level of anxiety will decrease Outcome: Not Progressing

## 2023-11-27 NOTE — Transfer of Care (Signed)
 Immediate Anesthesia Transfer of Care Note  Patient: Nicholas Graham  Procedure(s) Performed: XI ROBOTIC ASSISTED THORACOSCOPY-LEFT UPPER LOBECTOMY (Left: Chest) BLOCK, NERVE, INTERCOSTAL LYMPH NODE BIOPSY  Patient Location: PACU  Anesthesia Type:General  Level of Consciousness: awake, alert , and oriented  Airway & Oxygen Therapy: Patient Spontanous Breathing  Post-op Assessment: Report given to RN and Post -op Vital signs reviewed and stable  Post vital signs: Reviewed and stable  Last Vitals:  Vitals Value Taken Time  BP    Temp    Pulse 90 11/27/23 1213  Resp 14 11/27/23 1213  SpO2 91 % 11/27/23 1213  Vitals shown include unfiled device data.  Last Pain:  Vitals:   11/27/23 0744  TempSrc: Oral  PainSc:       Patients Stated Pain Goal: 2 (11/27/23 0710)  Complications: No notable events documented.

## 2023-11-27 NOTE — Anesthesia Procedure Notes (Signed)
 Procedure Name: Intubation Date/Time: 11/27/2023 9:06 AM  Performed by: Camillia Herter, CRNAPre-anesthesia Checklist: Patient identified, Emergency Drugs available, Suction available and Patient being monitored Patient Re-evaluated:Patient Re-evaluated prior to induction Oxygen Delivery Method: Circle System Utilized Preoxygenation: Pre-oxygenation with 100% oxygen Induction Type: IV induction Ventilation: Mask ventilation without difficulty Laryngoscope Size: Mac and 4 Grade View: Grade I Tube type: Oral Endobronchial tube: Double lumen EBT and 39 Fr Number of attempts: 1 Airway Equipment and Method: Stylet Placement Confirmation: ETT inserted through vocal cords under direct vision, positive ETCO2 and breath sounds checked- equal and bilateral Tube secured with: Tape Dental Injury: Teeth and Oropharynx as per pre-operative assessment

## 2023-11-27 NOTE — Op Note (Signed)
      [address]       [place] [address]             [phone]        11/27/2023  Patient:  [name] Pre-Op Dx: Left upper lobe NSCLC   Post-op Dx:  same Procedure: - Robotic assisted left video thoracoscopy - Left upper lobectomy - Mediastinal lymph node sampling - Intercostal nerve block  Surgeon and Role:      * Orvin Netter, Eliezer Lofts, MD - Primary  Assistant: Jaclyn Prime, PA-C  An experienced assistant was required given the complexity of this surgery and the standard of surgical care. The assistant was needed for exposure, dissection, suctioning, retraction of delicate tissues and sutures, instrument exchange and for overall help during this procedure.    Anesthesia  general EBL:  100 ml Blood Administration: none Specimen:  left upper lobe, hilar and mediastinal nodes  Drains: 28 F argyle chest tube in left chest Counts: correct   Indications: 73 year old male with a 3.6 cm left upper lobe biopsy-proven non-small cell lung cancer. He also has a history of recent headaches. His MRI brain was reviewed by me, and on my read there is no evidence of any metastatic disease. I will await formal read. We discussed the risks and benefits of a left robotic assisted thoracoscopy with left upper lobectomy. He is agreeable to proceed.  Findings: Bulky lymph nodes  Operative Technique: After the risks, benefits and alternatives were thoroughly discussed, the patient was brought to the operative theatre.  Anesthesia was induced, and the patient was then placed in a lateral decubitus position and was prepped and draped in normal sterile fashion.  An appropriate surgical pause was performed, and pre-operative antibiotics were dosed accordingly.  We began by placing our 4 robotic ports in the the 7th intercostal space targeting the hilum of the lung.  A 12mm assistant port was placed in the 9th intercostal space in the anterior axillary line.  The robot was then  docked and all instruments were passed under direct visualization.    The lung was then retracted superiorly, and the inferior pulmonary ligament was divided.  The hilum was mobilized anteriorly and posteriorly.  We identified the upper lobe pulmonary vein, and after careful isolation, it was divided with a vascular stapler.  We next moved to the pulmonary artery.  The artery was then divided with a vascular load stapler.  The bronchus to the upper lobe was then isolated.  After a test clamp, with good ventilation of the remaining lung, the bronchus was then divided.  The fissure was completed, and the specimen was passed into an endocatch bag.  It was removed from the anterior access site.    Lymph nodes were then sampled at hilum and mediastinum.  The chest was irrigated, and an air leak test was performed.  An intercostal nerve block was performed under direct visualization.  A 28 F chest tube was then placed, and we watch the remaining lobes re-expand.  The skin and soft tissue were closed with absorbable suture    The patient tolerated the procedure without any immediate complications, and was transferred to the PACU in stable condition.  Esmirna Ravan Keane Scrape

## 2023-11-27 NOTE — Brief Op Note (Signed)
 11/27/2023  12:59 PM  PATIENT:  Nicholas Graham  73 y.o. male  PRE-OPERATIVE DIAGNOSIS:  LUNG CANCER  POST-OPERATIVE DIAGNOSIS:  LUNG CANCER  PROCEDURE:  Procedure(s):  XI ROBOTIC ASSISTED THORACOSCOPY-  LEFT UPPER LOBECTOMY (Left) BLOCK, NERVE, INTERCOSTAL LYMPH NODE BIOPSY  SURGEON:  Surgeons and Role:    * Lightfoot, Eliezer Lofts, MD - Primary  PHYSICIAN ASSISTANT: Lowella Dandy PA-C, Percival Spanish PA-Student  ASSISTANTS: none   ANESTHESIA:   general  EBL:  100 mL   BLOOD ADMINISTERED:none  DRAINS:  Blake Drain Left Chest    LOCAL MEDICATIONS USED:  MARCAINE     SPECIMEN:  Source of Specimen:  Left Upper Lobe, Lymph Nodes  DISPOSITION OF SPECIMEN:  PATHOLOGY  COUNTS:  YES  TOURNIQUET:  * No tourniquets in log *  DICTATION: .Dragon Dictation  PLAN OF CARE: Admit to inpatient   PATIENT DISPOSITION:  PACU - hemodynamically stable.   Delay start of Pharmacological VTE agent (>24hrs) due to surgical blood loss or risk of bleeding: no

## 2023-11-27 NOTE — Hospital Course (Signed)
 History of Present Illness:  Nicholas Graham is a 73 yo male with history of COPD, HTN, OSA, Diverticulosis, GERD, and chronic back pain.  He was found to have a lingular nodule since 2017 which has been monitored.  He underwent a CT scan for abdominal pain, right shoulder pain, and rib pain.  This showed a 3.6 x 2.2 cm predominantly solid mass witt left upper lobe with some associated mild pleural retraction.  PET CT scan was obtained and showed mass with focus of central mild hypermetabolic activity.  He underwent Bronchoscopy with biopsies and cultures which confirmed the presence of non-small cell lung cancer.  He was referred to Dr. Cliffton Asters who felt the patient should undergo Robotic Assisted Thoracoscopy with Left Upper Lobectomy.  MRI of brain was obtained due to complaints of daily headache without evidence of metastasis.  The risks and benefits of the procedure were explained to the patient and he was agreeable to proceed.  Hospital Course:  Nicholas Graham presented to [facility] on 11/27/2023.  He was taken to the operating room and underwent Robotic Assisted Left Upper Lobectomy. He tolerated the procedure without difficulty, was extubated and taken to PACU in stable condition.  He has done well post operatively.  His chest xray did not show evidence of pneumothorax.  He did have some residual fluid along the left base.  Chest tube was in place without an air leak and was clamped on POD #1.  Follow up CXR remained stable.  His chest tube was able to be removed.  His blood pressure was initially low after surgery and his home blood pressure medications were held.  He is ambulating without difficulty.  His surgical incisions are healing without evidence on infection.  He is felt stable for discharge home today.

## 2023-11-27 NOTE — Interval H&P Note (Signed)
 History and Physical Interval Note:  11/27/2023 8:32 AM  Nicholas Graham  has presented today for surgery, with the diagnosis of LUNG CANCER.  The various methods of treatment have been discussed with the patient and family. After consideration of risks, benefits and other options for treatment, the patient has consented to  Procedure(s): XI ROBOTIC ASSISTED THORACOSCOPY-LEFT UPPER LOBECTOMY (Left) BLOCK, NERVE, INTERCOSTAL LYMPH NODE BIOPSY as a surgical intervention.  The patient's history has been reviewed, patient examined, no change in status, stable for surgery.  I have reviewed the patient's chart and labs.  Questions were answered to the patient's satisfaction.     [name]

## 2023-11-28 ENCOUNTER — Inpatient Hospital Stay (HOSPITAL_COMMUNITY)

## 2023-11-28 ENCOUNTER — Encounter (HOSPITAL_BASED_OUTPATIENT_CLINIC_OR_DEPARTMENT_OTHER): Payer: PPO

## 2023-11-28 ENCOUNTER — Encounter (HOSPITAL_COMMUNITY): Payer: Self-pay | Admitting: Thoracic Surgery (Cardiothoracic Vascular Surgery)

## 2023-11-28 LAB — BASIC METABOLIC PANEL
Anion gap: 7 (ref 5–15)
BUN: 14 mg/dL (ref 8–23)
CO2: 28 mmol/L (ref 22–32)
Calcium: 8.9 mg/dL (ref 8.9–10.3)
Chloride: 102 mmol/L (ref 98–111)
Creatinine, Ser: 0.93 mg/dL (ref 0.61–1.24)
GFR, Estimated: >60 mL/min (ref >60)
Glucose, Bld: 125 mg/dL — ABNORMAL HIGH (ref 70–99)
Potassium: 4.4 mmol/L (ref 3.5–5.1)
Sodium: 137 mmol/L (ref 135–145)

## 2023-11-28 LAB — CBC
HCT: 35.8 % — ABNORMAL LOW (ref 39.0–52.0)
Hemoglobin: 12.3 g/dL — ABNORMAL LOW (ref 13.0–17.0)
MCH: 31.1 pg (ref 26.0–34.0)
MCHC: 34.4 g/dL (ref 30.0–36.0)
MCV: 90.6 fL (ref 80.0–100.0)
Platelets: 142 10*3/uL — ABNORMAL LOW (ref 150–400)
RBC: 3.95 MIL/uL — ABNORMAL LOW (ref 4.22–5.81)
RDW: 12.9 % (ref 11.5–15.5)
WBC: 8 10*3/uL (ref 4.0–10.5)
nRBC: 0 % (ref 0.0–0.2)

## 2023-11-28 MED ORDER — LACTULOSE 10 GM/15ML PO SOLN
20.0000 g | Freq: Once | ORAL | Status: AC
  Administered 2023-11-28: 20 g via ORAL
  Filled 2023-11-28: qty 30

## 2023-11-28 MED ORDER — IBUPROFEN 200 MG PO TABS: 400.0000 mg | ORAL_TABLET | Freq: Three times a day (TID) | ORAL | Status: DC | PRN

## 2023-11-28 NOTE — Plan of Care (Signed)
  Problem: Education: Goal: Knowledge of General Education information will improve Description: Including pain rating scale, medication(s)/side effects and non-pharmacologic comfort measures Outcome: Progressing   Problem: Clinical Measurements: Goal: Ability to maintain clinical measurements within normal limits will improve Outcome: Progressing Goal: Will remain free from infection Outcome: Progressing Goal: Respiratory complications will improve Outcome: Progressing   Problem: Activity: Goal: Risk for activity intolerance will decrease Outcome: Progressing   Problem: Nutrition: Goal: Adequate nutrition will be maintained Outcome: Progressing   

## 2023-11-28 NOTE — TOC Transition Note (Signed)
 Transition of Care [facility]) - Discharge Note   Patient Details  Name: Nicholas Graham MRN: [id number] Date of Birth: 1950-10-04  Transition of Care [facility]) CM/SW Contact:  Leone Haven, RN Phone Number: 11/28/2023, 3:38 PM   Clinical Narrative:    For dc today, has no needs.     Barriers to Discharge: Continued Medical Work up   Patient Goals and CMS Choice            Discharge Placement                       Discharge Plan and Services Additional resources added to the After Visit Summary for                                       Social Drivers of Health (SDOH) Interventions SDOH Screenings   Food Insecurity: No Food Insecurity (11/27/2023)  Housing: Low Risk  (11/27/2023)  Transportation Needs: No Transportation Needs (11/27/2023)  Utilities: Not At Risk (11/27/2023)  Alcohol Screen: Low Risk  (06/19/2023)  Depression (PHQ2-9): Low Risk  (11/06/2023)  Financial Resource Strain: Low Risk  (06/19/2023)  Physical Activity: Sufficiently Active (06/19/2023)  Social Connections: Moderately Isolated (11/27/2023)  Stress: No Stress Concern Present (06/19/2023)  Tobacco Use: Medium Risk (11/27/2023)  Health Literacy: Inadequate Health Literacy (06/19/2023)     Readmission Risk Interventions     No data to display

## 2023-11-28 NOTE — Discharge Summary (Signed)
 Physician Discharge Summary  Patient ID: Nicholas Graham MRN: [id number] DOB/AGE: 1951/06/29 73 y.o.  Admit date: 11/27/2023 Discharge date: 11/28/2023  Admission Diagnoses:  Patient Active Problem List   Diagnosis Date Noted   Malignant neoplasm of upper lobe of left lung (HCC) 10/10/2023   COPD (chronic obstructive pulmonary disease) (HCC) 10/10/2023   Abdominal pain 10/10/2023   Drug-induced constipation 06/10/2023   Epigastric pain 06/10/2023   Renal artery aneurysm (HCC)-repeat CT abdomen 05/2025 05/31/2023   Aortic atherosclerosis (HCC) 05/31/2023   Foraminal stenosis of cervical region 11/07/2021   S/P right rotator cuff repair 08/02/2021   Biceps tendinosis of right shoulder 06/05/2021   Pain management contract agreement 09/12/2020   Cervical neck pain with evidence of disc disease 01/15/2019   Chronic midline thoracic back pain 01/15/2019   Thoracic degenerative disc disease 01/15/2019   History of lumbar discectomy 01/15/2019   DDD (degenerative disc disease), lumbar 01/15/2019   OSA on CPAP 07/04/2018   Benign prostatic hyperplasia with lower urinary tract symptoms 11/15/2017   Anxiety 09/12/2017   Stopped smoking with greater than 30 pack year history 07/30/2017   Elevated hemoglobin A1c 02/19/2014   Dyslipidemia 02/19/2014   Essential hypertension, benign 02/19/2014   Left nephrolithiasis 02/19/2014   History of colonic polyps 09/23/2009   GERD 12/12/2007    Discharge Diagnoses:   Patient Active Problem List   Diagnosis Date Noted   S/P partial lobectomy of lung 11/27/2023   S/P lobectomy of lung 11/27/2023   Malignant neoplasm of upper lobe of left lung (HCC) 10/10/2023   COPD (chronic obstructive pulmonary disease) (HCC) 10/10/2023   Abdominal pain 10/10/2023   Drug-induced constipation 06/10/2023   Epigastric pain 06/10/2023   Renal artery aneurysm (HCC)-repeat CT abdomen 05/2025 05/31/2023   Aortic atherosclerosis (HCC) 05/31/2023   Foraminal  stenosis of cervical region 11/07/2021   S/P right rotator cuff repair 08/02/2021   Biceps tendinosis of right shoulder 06/05/2021   Pain management contract agreement 09/12/2020   Cervical neck pain with evidence of disc disease 01/15/2019   Chronic midline thoracic back pain 01/15/2019   Thoracic degenerative disc disease 01/15/2019   History of lumbar discectomy 01/15/2019   DDD (degenerative disc disease), lumbar 01/15/2019   OSA on CPAP 07/04/2018   Benign prostatic hyperplasia with lower urinary tract symptoms 11/15/2017   Anxiety 09/12/2017   Stopped smoking with greater than 30 pack year history 07/30/2017   Elevated hemoglobin A1c 02/19/2014   Dyslipidemia 02/19/2014   Essential hypertension, benign 02/19/2014   Left nephrolithiasis 02/19/2014   History of colonic polyps 09/23/2009   GERD 12/12/2007   Discharged Condition: good  History of Present Illness:  Nicholas Graham is a 73 yo male with history of COPD, HTN, OSA, Diverticulosis, GERD, and chronic back pain.  He was found to have a lingular nodule since 2017 which has been monitored.  He underwent a CT scan for abdominal pain, right shoulder pain, and rib pain.  This showed a 3.6 x 2.2 cm predominantly solid mass witt left upper lobe with some associated mild pleural retraction.  PET CT scan was obtained and showed mass with focus of central mild hypermetabolic activity.  He underwent Bronchoscopy with biopsies and cultures which confirmed the presence of non-small cell lung cancer.  He was referred to Dr. Cliffton Asters who felt the patient should undergo Robotic Assisted Thoracoscopy with Left Upper Lobectomy.  MRI of brain was obtained due to complaints of daily headache without evidence of metastasis.  The risks and benefits  of the procedure were explained to the patient and he was agreeable to proceed.  Hospital Course:  Nicholas Graham presented to [facility] on 11/27/2023.  He was taken to the operating room and  underwent Robotic Assisted Left Upper Lobectomy. He tolerated the procedure without difficulty, was extubated and taken to PACU in stable condition.  He has done well post operatively.  His chest xray did not show evidence of pneumothorax.  He did have some residual fluid along the left base.  Chest tube was in place without an air leak and was clamped on POD #1.  Follow up CXR remained stable.  His chest tube was able to be removed.  His blood pressure was initially low after surgery and his home blood pressure medications were held.  He is ambulating without difficulty.  His surgical incisions are healing without evidence on infection.  He is felt stable for discharge home today.  Consults: None  Significant Diagnostic Studies: Nuclear medicine:   FINDINGS: Choose one   NECK: No hypermetabolic lymph nodes in the neck.   Incidental CT findings: None.   CHEST: Flattened masslike consolidation in the LEFT upper lobe persists from comparison CT 08/29/2023 without interval change by CT imaging measuring 39 x 18 mm. Lesion has a focus of central mild metabolic activity with SUV max equal 3.0. Majority of the lesion does not have significant metabolic activity. (Image 74)   Nodule in the lingula with central calcification is unchanged from comparison exams and does not have metabolic activity (image 92   No hypermetabolic mediastinal lymph nodes.   Incidental CT findings: None.   ABDOMEN/PELVIS: No abnormal hypermetabolic activity within the liver, pancreas, adrenal glands, or spleen. No hypermetabolic lymph nodes in the abdomen or pelvis.   Incidental CT findings: None.   SKELETON: No focal hypermetabolic activity to suggest skeletal metastasis.   Incidental CT findings: None.   IMPRESSION: 1. Persistent platelike mass density in the LEFT upper lobe with central metabolic activity. Indeterminate lesion. Persistence of lesion is concerning for bronchogenic carcinoma.  Recommend bronchoscopy for further evaluation. 2. No evidence of metastatic adenopathy or distant metastatic disease. 3. Benign nodule in the lingula.     Electronically Signed   By: Genevive Bi M.D.   On: 10/04/2023 15:51  Treatments: surgery:  11/27/2023   Patient:  [name] Pre-Op Dx: Left upper lobe NSCLC   Post-op Dx:  same Procedure: - Robotic assisted left video thoracoscopy - Left upper lobectomy - Mediastinal lymph node sampling - Intercostal nerve block   Surgeon and Role:      * Lightfoot, Eliezer Lofts, MD - Primary   Assistant: Jaclyn Prime, PA-C  An experienced assistant was required given the complexity of this surgery and the standard of surgical care. The assistant was needed for exposure, dissection, suctioning, retraction of delicate tissues and sutures, instrument exchange and for overall help during this procedure.     Anesthesia  general EBL:  100 ml Blood Administration: none Specimen:  left upper lobe, hilar and mediastinal nodes   Drains: 28 F argyle chest tube in left chest Counts: correct   PATHOLOGY: Pending  Discharge Exam: Blood pressure 107/68, pulse (!) 108, temperature 98 F (36.7 C), temperature source Oral, resp. rate 18, height 6\' 3"  (1.905 m), weight 86.2 kg, SpO2 93%.  General appearance: alert, cooperative, and no distress Heart: regular rate and rhythm Lungs: diminished breath sounds left base Abdomen: soft, non-tender; bowel sounds normal; no masses,  no organomegaly Extremities:  extremities normal, atraumatic, no cyanosis or edema Wound: clean and dry  Discharge disposition: 01-Home or Self Care    Allergies as of 11/28/2023       Reactions   Penicillins Hives   Celebrex [celecoxib] Other (See Comments)   unknown reaction   Cymbalta [duloxetine Hcl] Other (See Comments)   unknown        Medication List     TAKE these medications    amLODipine 10 MG tablet Commonly known as: NORVASC Take 1 tablet  (10 mg total) by mouth daily.   aspirin 81 MG tablet Take 81 mg by mouth daily.   baclofen 20 MG tablet Commonly known as: LIORESAL Take 0.5-1 tablets (10-20 mg total) by mouth 3 (three) times daily.   escitalopram 20 MG tablet Commonly known as: LEXAPRO Take 1 tablet (20 mg total) by mouth daily.   famotidine 20 MG tablet Commonly known as: Pepcid Take 1 tablet (20 mg total) by mouth 2 (two) times daily.   gabapentin 600 MG tablet Commonly known as: NEURONTIN Take 1 tablet (600 mg total) by mouth 3 (three) times daily.   hydrochlorothiazide 25 MG tablet Commonly known as: HYDRODIURIL Take 1 tablet (25 mg total) by mouth daily.   ibuprofen 200 MG tablet Commonly known as: ADVIL Take 2 tablets (400 mg total) by mouth every 8 (eight) hours as needed for mild pain (pain score 1-3).   lisinopril 10 MG tablet Commonly known as: ZESTRIL Take 1 tablet (10 mg total) by mouth daily.   MegaRed Omega-3 Krill Oil 500 MG Caps Take 2 capsules by mouth daily.   Myrbetriq 50 MG Tb24 tablet Generic drug: mirabegron ER Take 50 mg by mouth daily.   oxyCODONE-acetaminophen 10-325 MG tablet Commonly known as: PERCOCET Take 1 tablet by mouth every 8 (eight) hours as needed for pain. What changed: Another medication with the same name was removed. Continue taking this medication, and follow the directions you see here.   pantoprazole 40 MG tablet Commonly known as: PROTONIX Take 1 tablet (40 mg total) by mouth daily.   simvastatin 10 MG tablet Commonly known as: ZOCOR Take 1 tablet (10 mg total) by mouth every other day.        Follow-up Information     Corliss Skains, MD Follow up on 12/06/2023.   Specialty: Cardiothoracic Surgery Why: Appointment is at 12:40 Contact information: [address] [place] [address] [phone]                 Signed: Lowella Dandy, PA-C  11/28/2023, 3:21 PM

## 2023-11-28 NOTE — Progress Notes (Signed)
      [address]       [place] [address]             [phone]      1 Day Post-Op Procedure(s) (LRB): XI ROBOTIC ASSISTED THORACOSCOPY-LEFT UPPER LOBECTOMY (Left) BLOCK, NERVE, INTERCOSTAL LYMPH NODE BIOPSY  Subjective:  Patient is doing well.  Has some pain along his left chest.  He has been up and ambulated to bathroom.  Some burning with urination  Denies N/V  Objective: Vital signs in last 24 hours: Temp:  [97.5 F (36.4 C)-98.8 F (37.1 C)] 98.3 F (36.8 C) (03/13 0744) Pulse Rate:  [80-108] 102 (03/13 0744) Cardiac Rhythm: Normal sinus rhythm;Bundle branch block (03/13 0722) Resp:  [11-27] 20 (03/13 0744) BP: (91-116)/(52-67) 91/61 (03/13 0744) SpO2:  [89 %-99 %] 95 % (03/13 0744)  Intake/Output from previous day: 03/12 0701 - 03/13 0700 In: 2600 [P.O.:200; I.V.:1800; IV Piggyback:600] Out: 2170 [Urine:1650; Blood:100; Chest Tube:420] Intake/Output this shift: Total I/O In: 120 [P.O.:120] Out: -   General appearance: alert, cooperative, and no distress Heart: regular rate and rhythm Lungs: diminished breath sounds left base Abdomen: soft, non-tender; bowel sounds normal; no masses,  no organomegaly Extremities: extremities normal, atraumatic, no cyanosis or edema Wound: clean and dry  Lab Results: Recent Labs    11/25/23 1340 11/28/23 0246  WBC 7.2 8.0  HGB 15.5 12.3*  HCT 45.2 35.8*  PLT 178 142*   BMET:  Recent Labs    11/25/23 1340 11/28/23 0246  NA 138 137  K 4.2 4.4  CL 102 102  CO2 28 28  GLUCOSE 100* 125*  BUN 15 14  CREATININE 0.93 0.93  CALCIUM 9.4 8.9    PT/INR:  Recent Labs    11/25/23 1340  LABPROT 13.1  INR 1.0   ABG No results found for: "PHART", "HCO3", "TCO2", "ACIDBASEDEF", "O2SAT" CBG (last 3)  No results for input(s): "GLUCAP" in the last 72 hours.  Assessment/Plan: S/P Procedure(s) (LRB): XI ROBOTIC ASSISTED THORACOSCOPY-LEFT UPPER LOBECTOMY (Left) BLOCK, NERVE, INTERCOSTAL LYMPH NODE  BIOPSY  CV- NSR, H/O HTN-BP low this morning.. will hold home antihypertensives for now Pulm- CT on water seal, 420 cc output since surgery.. CXR with left pleural effusions/atelectasis.Marland Kitchen no air leak.Marland Kitchen as discussed with Dr. Cliffton Asters do clamping trial today, with possible tube removal later this afternoon Renal-creatinine, lytes are okay Dysuria- likely due to foley previously placed.. should resolve within 24 hours.. patient voiding without issue Lovenox for DVT prophylaxis  Dispo- clamping trial today, repeat CXR @12 , if no pneumothorax will d/c chest tube this afternoon with plan for d/c later today   LOS: 1 day    Lowella Dandy, PA-C 11/28/2023

## 2023-11-28 NOTE — Anesthesia Postprocedure Evaluation (Signed)
 Anesthesia Post Note  Patient: Nicholas Graham  Procedure(s) Performed: XI ROBOTIC ASSISTED THORACOSCOPY-LEFT UPPER LOBECTOMY (Left: Chest) BLOCK, NERVE, INTERCOSTAL LYMPH NODE BIOPSY     Patient location during evaluation: PACU Anesthesia Type: General Level of consciousness: awake and alert Pain management: pain level controlled Vital Signs Assessment: post-procedure vital signs reviewed and stable Respiratory status: spontaneous breathing, nonlabored ventilation, respiratory function stable and patient connected to nasal cannula oxygen Cardiovascular status: blood pressure returned to baseline and stable Postop Assessment: no apparent nausea or vomiting Anesthetic complications: no   No notable events documented.            Mariann Barter

## 2023-11-28 NOTE — Plan of Care (Signed)
  Problem: Clinical Measurements: Goal: Respiratory complications will improve Outcome: Progressing Goal: Cardiovascular complication will be avoided Outcome: Progressing   Problem: Activity: Goal: Risk for activity intolerance will decrease Outcome: Progressing   Problem: Nutrition: Goal: Adequate nutrition will be maintained Outcome: Progressing   Problem: Coping: Goal: Level of anxiety will decrease Outcome: Progressing   Problem: Pain Managment: Goal: General experience of comfort will improve and/or be controlled Outcome: Progressing   Problem: Safety: Goal: Ability to remain free from injury will improve Outcome: Progressing   Problem: Pain Management: Goal: Pain level will decrease Outcome: Progressing

## 2023-11-28 NOTE — TOC Initial Note (Signed)
 Transition of Care [facility]) - Initial/Assessment Note    Patient Details  Name: Nicholas Graham MRN: [id number] Date of Birth: 05/09/1951  [facility]) CM/SW Contact:    Marliss Coots, LCSW Phone Number: 11/28/2023, 3:04 PM  Clinical Narrative:                  3:04 PM CSW added SDOH resources (social connections) to patient's AVS.    Barriers to Discharge: Continued Medical Work up   Patient Goals and CMS Choice            Expected Discharge Plan and Services       Living arrangements for the past 2 months: Single Family Home                                      Prior Living Arrangements/Services Living arrangements for the past 2 months: Single Family Home Lives with:: Spouse Patient language and need for interpreter reviewed:: Yes              Criminal Activity/Legal Involvement Pertinent to Current Situation/Hospitalization: No - Comment as needed  Activities of Daily Living   ADL Screening (condition at time of admission) Independently performs ADLs?: Yes (appropriate for developmental age) Is the patient deaf or have difficulty hearing?: No Does the patient have difficulty seeing, even when wearing glasses/contacts?: No Does the patient have difficulty concentrating, remembering, or making decisions?: No  Permission Sought/Granted Permission sought to share information with : Family Supports Permission granted to share information with : No (Contact information on chart)  Share Information with NAME: Khaliq Turay     Permission granted to share info w Relationship: Spouse  Permission granted to share info w Contact Information: [phone]  Emotional Assessment       Orientation: : Oriented to Self, Oriented to Place, Oriented to  Time, Oriented to Situation Alcohol / Substance Use: Not Applicable Psych Involvement: No (comment)  Admission diagnosis:  S/P partial lobectomy of lung [Z90.2] S/P lobectomy of lung  [Z90.2] Patient Active Problem List   Diagnosis Date Noted   S/P partial lobectomy of lung 11/27/2023   S/P lobectomy of lung 11/27/2023   Malignant neoplasm of upper lobe of left lung (HCC) 10/10/2023   COPD (chronic obstructive pulmonary disease) (HCC) 10/10/2023   Abdominal pain 10/10/2023   Drug-induced constipation 06/10/2023   Epigastric pain 06/10/2023   Renal artery aneurysm (HCC)-repeat CT abdomen 05/2025 05/31/2023   Aortic atherosclerosis (HCC) 05/31/2023   Foraminal stenosis of cervical region 11/07/2021   S/P right rotator cuff repair 08/02/2021   Biceps tendinosis of right shoulder 06/05/2021   Pain management contract agreement 09/12/2020   Cervical neck pain with evidence of disc disease 01/15/2019   Chronic midline thoracic back pain 01/15/2019   Thoracic degenerative disc disease 01/15/2019   History of lumbar discectomy 01/15/2019   DDD (degenerative disc disease), lumbar 01/15/2019   OSA on CPAP 07/04/2018   Benign prostatic hyperplasia with lower urinary tract symptoms 11/15/2017   Anxiety 09/12/2017   Stopped smoking with greater than 30 pack year history 07/30/2017   Elevated hemoglobin A1c 02/19/2014   Dyslipidemia 02/19/2014   Essential hypertension, benign 02/19/2014   Left nephrolithiasis 02/19/2014   History of colonic polyps 09/23/2009   GERD 12/12/2007   PCP:  Natalia Leatherwood, DO Pharmacy:   [facility] [place], [place] - [address]  [address] [place] [address] Phone: [phone] Fax: [phone]     Social Drivers of Health (SDOH) Social History: SDOH Screenings   Food Insecurity: No Food Insecurity (11/27/2023)  Housing: Low Risk  (11/27/2023)  Transportation Needs: No Transportation Needs (11/27/2023)  Utilities: Not At Risk (11/27/2023)  Alcohol Screen: Low Risk  (06/19/2023)  Depression (PHQ2-9): Low Risk  (11/06/2023)  Financial Resource Strain: Low Risk  (06/19/2023)  Physical Activity: Sufficiently Active  (06/19/2023)  Social Connections: Moderately Isolated (11/27/2023)  Stress: No Stress Concern Present (06/19/2023)  Tobacco Use: Medium Risk (11/27/2023)  Health Literacy: Inadequate Health Literacy (06/19/2023)   SDOH Interventions: Social Connections Interventions: Inpatient TOC, Community Resources Provided   Readmission Risk Interventions     No data to display

## 2023-11-28 NOTE — Discharge Instructions (Addendum)
 Discharge Instructions:  1. You may shower, please wash incisions daily with soap and water and keep dry.  If you wish to cover wounds with dressing you may do so but please keep clean and change daily.  No tub baths or swimming until incisions have completely healed.  If your incisions become red or develop any drainage please call our office at [phone]  2. No Driving until cleared by Dr. Lucilla Lame office and you are no longer using narcotic pain medications  3. Fever of 101.5 for at least 24 hours with no source, please contact our office at [phone]  4. Activity- up as tolerated, please walk at least 3 times per day.  Avoid strenuous activity for 6 weeks  5. If any questions or concerns arise, please do not hesitate to contact our office at [phone]   To address social isolation:  -[facility] [place]: [phone] / [address] [place], [place] [address]  -Dial 988: Talk lifeline 24/7.  -[facility] Warmline: [phone]  [facility] (Adult [facility])  - Address: [address] [place], [place] [address] - General office #:  [phone] - Enrollment Phone #: [phone]  [facility]  - [facility] Line: call toll free, available 24 hours a day, at [phone]   [place] [address]

## 2023-11-28 NOTE — Progress Notes (Signed)
 Removed patients' chest tube with no complications

## 2023-11-28 NOTE — Progress Notes (Signed)
 AVS paperwork gone over with patient/family, all questions answered, ivs removed, belongings gathered, and patient was wheeled out to family vehicle.

## 2023-11-29 ENCOUNTER — Telehealth: Payer: Self-pay

## 2023-11-29 NOTE — Transitions of Care (Post Inpatient/ED Visit) (Signed)
 11/29/2023  Name: Nicholas Graham MRN: [id number] DOB: 02/11/1951  Today's TOC FU Call Status: Today's TOC FU Call Status:: Successful TOC FU Call Completed TOC FU Call Complete Date: 11/29/23 Patient's Name and Date of Birth confirmed.  Transition Care Management Follow-up Telephone Call Date of Discharge: 11/28/23 Discharge Facility: [facility]) Type of Discharge: Inpatient Admission Primary Inpatient Discharge Diagnosis:: Malignant neoplasm of upper lobe of left lung How have you been since you were released from the hospital?: Better (restless sleep last night.   Wife reports breathing well) Any questions or concerns?: No  Items Reviewed: Did you receive and understand the discharge instructions provided?: Yes Medications obtained,verified, and reconciled?: Yes (Medications Reviewed) Any new allergies since your discharge?: No Dietary orders reviewed?: Yes Type of Diet Ordered:: low salt diet Do you have support at home?: Yes People in Home: spouse  Medications Reviewed Today: Medications Reviewed Today     Reviewed by Earlie Server, RN (Registered Nurse) on 11/29/23 at 1013  Med List Status: <None>   Medication Order Taking? Sig Documenting Provider Last Dose Status Informant  amLODipine (NORVASC) 10 MG tablet [id number] Yes Take 1 tablet (10 mg total) by mouth daily. Kuneff, Renee A, DO Taking Active Self  aspirin 81 MG tablet [id number] Yes Take 81 mg by mouth daily. [provider] Taking Active Self           Med Note Tiburcio Pea, Luvenia Redden   Thu Nov 21, 2023 10:03 AM) On hold due to upcoming procedure  baclofen (LIORESAL) 20 MG tablet [id number] Yes Take 0.5-1 tablets (10-20 mg total) by mouth 3 (three) times daily. Kuneff, Renee A, DO Taking Active Self  escitalopram (LEXAPRO) 20 MG tablet [id number] Yes Take 1 tablet (20 mg total) by mouth daily. Kuneff, Renee A, DO Taking Active Spouse/Significant Other  famotidine (PEPCID) 20 MG tablet [id number] Yes Take 1  tablet (20 mg total) by mouth 2 (two) times daily. Kuneff, Renee A, DO Taking Active Spouse/Significant Other  gabapentin (NEURONTIN) 600 MG tablet [id number] Yes Take 1 tablet (600 mg total) by mouth 3 (three) times daily. Kuneff, Renee A, DO Taking Active Spouse/Significant Other  hydrochlorothiazide (HYDRODIURIL) 25 MG tablet [id number] Yes Take 1 tablet (25 mg total) by mouth daily. Claiborne Billings, Renee A, DO Taking Active Spouse/Significant Other  ibuprofen (ADVIL) 200 MG tablet [id number] Yes Take 2 tablets (400 mg total) by mouth every 8 (eight) hours as needed for mild pain (pain score 1-3). Barrett, Rae Roam, PA-C Taking Active   lisinopril (ZESTRIL) 10 MG tablet [id number] Yes Take 1 tablet (10 mg total) by mouth daily. Natalia Leatherwood, DO Taking Active Spouse/Significant Other  MegaRed Omega-3 Krill Oil 500 MG CAPS [id number] No Take 2 capsules by mouth daily.   Patient not taking: Reported on 11/29/2023   [provider] Not Taking Active Spouse/Significant Other           Med Note Tiburcio Pea, Joyce Copa Nov 21, 2023 10:06 AM) On hold due to upcoming procedure.  MYRBETRIQ 50 MG TB24 tablet [id number] Yes Take 50 mg by mouth daily. [provider] Taking Active Spouse/Significant Other  oxyCODONE-acetaminophen (PERCOCET) 10-325 MG tablet [id number] No Take 1 tablet by mouth every 8 (eight) hours as needed for pain.  Patient not taking: Reported on 11/29/2023   Felix Pacini A, DO Not Taking Active Spouse/Significant Other  pantoprazole (PROTONIX) 40 MG tablet [id number] Yes Take 1 tablet (40 mg total) by mouth daily. Kuneff, Renee A, DO Taking  Active Spouse/Significant Other  simvastatin (ZOCOR) 10 MG tablet [id number] Yes Take 1 tablet (10 mg total) by mouth every other day. Natalia Leatherwood, DO Taking Active Spouse/Significant Other  Med List Note Claiborne Billings, Renee A, DO 11/21/23 1014): ** ** Please do not delete duplicate oxy scripts. Thanks. **            Home Care and  Equipment/Supplies: Were Home Health Services Ordered?: No Any new equipment or medical supplies ordered?: No  Functional Questionnaire: Do you need assistance with bathing/showering or dressing?: Yes (wife) Do you need assistance with meal preparation?: Yes (family) Do you need assistance with eating?: No Do you have difficulty maintaining continence: No Do you need assistance with getting out of bed/getting out of a chair/moving?: No Do you have difficulty managing or taking your medications?: No  Follow up appointments reviewed: PCP Follow-up appointment confirmed?: Yes Date of PCP follow-up appointment?:  (encoruaged wife to call and get an sooner appointment.) Follow-up Provider: Felix Pacini Specialist [facility] Follow-up appointment confirmed?: Yes Date of Specialist follow-up appointment?: 12/06/23 Follow-Up Specialty Provider:: [place] Do you need transportation to your follow-up appointment?: No Do you understand care options if your condition(s) worsen?: Yes-patient verbalized understanding  SDOH Interventions Today    Flowsheet Row Most Recent Value  SDOH Interventions   Food Insecurity Interventions Intervention Not Indicated  Housing Interventions Intervention Not Indicated  Transportation Interventions Intervention Not Indicated  Utilities Interventions Intervention Not Indicated      Interventions Today    Flowsheet Row Most Recent Value  Chronic Disease   Chronic disease during today's visit Other  [Malignant neoplasm of upper lobe of left lung]  General Interventions   General Interventions Discussed/Reviewed General Interventions Discussed  Exercise Interventions   Exercise Discussed/Reviewed Physical Activity  [Reviewed importance of walkinf 3 times per day]  Physical Activity Discussed/Reviewed Types of exercise  Education Interventions   Education Provided Provided Education  [Reviewed importance of timely follow up . Encouraged wife to call MD office  for any concerns.  Reviewed patient had BM last night]  Provided Verbal Education On Nutrition, Medication, When to see the doctor, Insurance Plans  Nutrition Interventions   Nutrition Discussed/Reviewed Nutrition Discussed, Nutrition Reviewed  Pharmacy Interventions   Pharmacy Dicussed/Reviewed Medications and their functions       TOC Interventions Today    Flowsheet Row Most Recent Value  TOC Interventions   TOC Interventions Discussed/Reviewed TOC Interventions Discussed, TOC Interventions Reviewed, Post discharge activity limitations per provider, Post op wound/incision care, S/S of infection       Spoke with wife who is on the [place].  Wife reports patient is doing well. Reports restless sleep last night. Patients sister is a Engineer, civil (consulting) and will help if needed. Wife reports they have everything they need and denies any concerns today.  Reviewed and offered 30 day TOC program and wife declined.  I provided my contact information if patient needs assistance of has any additional questions.   Lonia Chimera, RN, BSN, CEN [employer]- Transition of Care Team.  [facility] [phone]

## 2023-12-02 ENCOUNTER — Encounter: Payer: Self-pay | Admitting: Radiation Oncology

## 2023-12-04 ENCOUNTER — Encounter: Payer: Self-pay | Admitting: Radiation Oncology

## 2023-12-06 ENCOUNTER — Ambulatory Visit (INDEPENDENT_AMBULATORY_CARE_PROVIDER_SITE_OTHER): Payer: Self-pay | Admitting: Thoracic Surgery (Cardiothoracic Vascular Surgery)

## 2023-12-06 ENCOUNTER — Encounter: Payer: Self-pay | Admitting: Thoracic Surgery (Cardiothoracic Vascular Surgery)

## 2023-12-06 VITALS — BP 105/68 | HR 95 | Resp 20 | Ht 75.0 in | Wt 190.6 lb

## 2023-12-06 DIAGNOSIS — Z902 Acquired absence of lung [part of]: Secondary | ICD-10-CM

## 2023-12-06 NOTE — Progress Notes (Signed)
      [address]       [place] [address]             [phone]        Nicholas Graham [facility] Medical Record #[id number] Date of Birth: 01/21/1951  Referring: Leslye Peer, MD Primary Care: Natalia Leatherwood, DO Primary Cardiologist:None  Reason for visit:   follow-up  History of Present Illness:     73yo male  presents for their first follow-up appointment.  Overall, he is doing well.   Physical Exam: There were no vitals taken for this visit.  Alert NAD Incision clean.  Abdomen, ND no peripheral edema   Diagnostic Studies & Laboratory data:  Path:  Clinical History: lung cancer (cm)     FINAL MICROSCOPIC DIAGNOSIS:  A. LYMPH NODE, LEVEL 9, EXCISION: One lymph node negative for metastatic carcinoma (0/1)  B. LYMPH NODE, HILAR #1, EXCISION: One lymph node negative for metastatic carcinoma (0/1)  C. LYMPH NODE, HILAR #2, EXCISION: One lymph node negative for metastatic carcinoma (0/1)  D. LYMPH NODE, LEVEL 6, EXCISION: One lymph node negative for metastatic carcinoma (0/1)  E. LUNG, LEFT UPPER LOBE, LOBECTOMY: Adenocarcinoma, acinar predominant, 3.3 cm (pT2a) (mid lateral lesion) Carcinoma involves visceral pleural connective tissue (PL1) All surgical margins negative for carcinoma Negative for lymphovascular involvement Two lymph nodes negative for metastatic carcinoma (0/2), see comment Pulmonary hamartoma, 1.4 cm (medial inferior lesion) Calcified nodule, 2 cm (superior lateral lesion) See oncology table and comment  F. LYMPH NODE, LEVEL 7, EXCISION: One lymph node negative for metastatic carcinoma (0/1)     Assessment / Plan:   73 yo male  s/p LULectomy for  T2aN0M0 Adenocarcinoma  Stage 1b.  I have made a referral to medical oncology, along with a 1 month follow-up with a CXR.     Corliss Skains 12/06/2023 12:11 PM

## 2023-12-11 LAB — ACID FAST CULTURE WITH REFLEXED SENSITIVITIES (MYCOBACTERIA): Acid Fast Culture: NEGATIVE

## 2023-12-12 ENCOUNTER — Other Ambulatory Visit: Payer: Self-pay

## 2023-12-12 NOTE — Progress Notes (Signed)
 Request for pt's lung tissue be sent to [facility] for molecular and PD-L1 testing emailed to [facility] path techs Cydney Bascom Levels and [employer]

## 2023-12-12 NOTE — Progress Notes (Signed)
 The proposed treatment discussed in conference is for discussion purpose only and is not a binding recommendation.  The patients have not been physically examined, or presented with their treatment options.  Therefore, final treatment plans cannot be decided.

## 2023-12-16 ENCOUNTER — Telehealth: Payer: Self-pay | Admitting: Internal Medicine

## 2023-12-16 MED ORDER — TRAMADOL HCL 50 MG PO TABS: ORAL_TABLET | ORAL | 0 refills | Status: DC

## 2023-12-16 NOTE — Telephone Encounter (Signed)
 Copied from CRM [id number]. Topic: Clinical - Medication Question >> Dec 16, 2023 10:26 AM Sundra Aland wrote: Reason for CRM:  Desiree from [employer] a refill of TraMADol and wants to confirm with other medication patient is currently taking. Would like a call back  Spoke with Desiree from [employer]. She would like Dr. Maple Hudson to be aware that pt is taking oxycodone and he is day 25/30. She would like clarification if needs to be taking both Tramadol and Oxy. Please advise Dr. Maple Hudson

## 2023-12-16 NOTE — Telephone Encounter (Signed)
 Needs help with post-thoracotomy pain. Tramadol helped in hospital Plan- sending tramadol

## 2023-12-16 NOTE — Telephone Encounter (Signed)
 Thank you. I didnn't realize he still has oxycodone. Please cancel Tramadol order.

## 2023-12-16 NOTE — Telephone Encounter (Signed)
 Tramadol order has been canceled. Spoke with Desiree from pharmacy and verified that pt does not need to take Tramadol. Nothing further needed

## 2023-12-16 NOTE — Addendum Note (Signed)
 Addended byFloydene Flock, Aquinn[place] Devin A on: 12/16/2023 02:00 PM   Modules accepted: Orders

## 2023-12-17 LAB — SURGICAL PATHOLOGY

## 2023-12-22 NOTE — Progress Notes (Signed)
 @Patient  ID: Nicholas Graham, male    DOB: Feb 21, 1951, 73 y.o.   MRN: [id number]  Chief Complaint  Patient presents with   Consult    Sleep OSA CPAP [employer]. Epworth : 1    Referring provider: Mariel Shope, DO  HPI: 73 year old male, former smoker quit in 2003 (90-pack-year history).  Past medical history significant for hypertension, OSA on CPAP.  Patient of Dr. Villa Graham, last seen in office on 11/26/2018.  08/24/2022 Patient presents today for overdue follow-up for OSA.   He has severe OSA HST 05/2018 severe OSA, AHI 57/hour, lowest desaturation 67%  On auto CPAP 10-16cm h20 with large full face mask  He reports compliance with CPAP.   He is having trouble with nasal pillow mask. Continues to have snoring symptoms.  He would like to change masks to resmed airfit n20 nasal cpap mask  Due for new CPAP machine in October 2024 Recommend trying wedge pillow   Airview download 07/25/22-08/23/22 Usage 29/30 days (97%); 18 days (60%) > 4 hours Average usage days used 4 hours 23 min's Pressure 10-15cm h20 (12.1cm h20-95%) Airleaks 33L/min (95%) AHI 1.0       Sleep questionnaire Symptoms-   Wakes up often; Snoring  Prior sleep study- 2019 Bedtime- 8pm Time to fall asleep- 10 mins  Nocturnal awakenings- 2-3 times Out of bed/start of day- 5am Weight changes- down 30 lbs since 2020 Do you operate heavy machinery- no Do you currently wear CPAP- yes Do you current wear oxygen- no Epworth- 2   Imaging: CT chest without contrast 04/2016 showed calcified granulomas and calcified mediastinal lymphadenopathy with a 7 x 8 mm nodule in the left mid lung which had a speck of calcification eccentric.     Assessment & Plan:   OSA on CPAP - Hx severe OSA, AHI 57/hour. Well controlled on auto CPAP. He is 97% compliant with CPAP use over the last 90 days, average usage 4 hours 23 mins. He continues to have snoring symptoms. Current pressure 10-15cm h20 without residual apneas. He would like  to change masks, DME order placed for mask fitting and renew CPAP supplies. Recommend patient try wedge pillow to help with snoring. He has lost 30 lbs since 2020. FU with Dr. Linder Graham (new patient) in 1 year or sooner if needed.  Nicholas Baumgarten, NP 08/24/2022 ============================================   09/17/23- 72 yoM former smoker (90 pkyrs, quit 2003) followed for OSA, complicated by HTN, Thrombocytopenia,  New to me today, Seen with wife. HST 05/2018 severe OSA, AHI 57/hour, lowest desaturation 67% CPAP auto 10-15       was due for replacement October, 2024 Download compliance- 5%, AHI 4.4/hr Body weight today-     192 lbs                                          // Note lung mass//  New issue OSA can be dealt with later. Focus today on LUL mass. I reviewed images with him and wife. He admits only occasional cough bringing up scant white mucus. Not aware of any adenopathy. An older discrete LUL nodule is stable, with calcium, likely old granuloma. He has been evaluated for RUQ/lateral chest pain without trauma.  Describes 1-2 days of black stools w/o recurrence. This was noted after taking a prescribed short term medicine he can't identify.   CT chest 08/29/23-  ordered  by Dr Nicholas Graham/ Sports Medicine   IMPRESSION: No fracture is seen. Specifically, the right ribs are intact. 3.6 cm left upper lobe mass, new from the prior, raising concern for primary bronchogenic carcinoma, specifically invasive adenocarcinoma. Aortic Atherosclerosis (ICD10-I70.0).  12/24/23- 72 yoM former smoker (90 pkyrs, quit 2003) followed for OSA, complicated by HTN, Thrombocytopenia, LULobectomy for AdenoCa 2025,  HST 05/2018 severe OSA, AHI 57/hour, lowest desaturation 67% CPAP auto 10-15       was due for replacement October, 2024 Download compliance- recording ended 10/16/23    AHI 3.6/hr Body weight today-    192 lbs Dr Nicholas Graham >LUL resection by Thoracic Surgery 11/27/23. Pending with Dr Nicholas Graham/ Oncology  4/10. Discussed the use of AI scribe software for clinical note transcription with the patient, who gave verbal consent to proceed.  History of Present Illness   The patient, with a history of chest surgery and sleep apnea, presents with a persistent cough producing white sputum. He speculates that the cough may be related to pollen and hopes it will improve as the pollen count decreases. He has been unable to use his CPAP machine due to recent chest surgery but feels ready to resume its use. The patient expresses dissatisfaction with his current CPAP mask, which he finds inconvenient and uncomfortable. He has tried different masks in the past but has not found a suitable one. The patient's CPAP machine is over 3 years old and he is working with a company called [employer] to replace it.  In addition to these issues, the patient has been experiencing some difficulty with his breathing but denies any wheezing. 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 He expresses concern about the potential impact of his work on his lungs. Wife emphasizes he is not careful about wearing respirator- discussed and self-protection emphasized.    Assessment and Plan:    Obstructive Sleep Apnea CPAP therapy interrupted post-surgery. Current machine outdated, mask uncomfortable and dislodges easily. - Request replacement of CPAP machine through [employer]. - Request refitting for a new CPAP mask, specifying 'mask of choice'. - Advise him to contact the home care company if no response is received regarding the machine replacement.  Chronic Cough Persistent cough with white sputum, likely exacerbated by seasonal pollen.  Post-Surgical Recovery Recovering from chest surgery, considering return to work with potential respiratory strain. - Advise him to avoid strenuous activities and heavy lifting during recovery. - Encourage use of protective masks  if returning to work involving exposure to respiratory irritants.  Lung Cancer Follow-up Under Dr. Bing Graham care for lung cancer, scheduled for follow-up tests. - Attend follow-up appointment with Dr. Marguerita Shih for x-rays and blood work.     ROS-see HPI   + = positive Constitutional:    weight loss, night sweats, fevers, chills, fatigue, lassitude. HEENT:    headaches, difficulty swallowing, tooth/dental problems, sore throat,       sneezing, itching, ear ache, nasal congestion, post nasal drip, snoring CV:    chest pain, orthopnea, PND, swelling in lower extremities, anasarca, dizziness, palpitations Resp:   shortness of breath with exertion or at rest.               + productive cough,   non-productive cough, coughing up of blood.              change in color of mucus.  wheezing.   Skin:    rash or lesions.  GI:  No-   heartburn, indigestion, + abdominal pain, nausea, vomiting, diarrhea,                + change in bowel habits, loss of appetite GU: dysuria, change in color of urine, no urgency or frequency.   flank pain. MS:   joint pain, stiffness, decreased range of motion, back pain. Neuro-     nothing unusual Psych:  change in mood or affect.  depression or anxiety.   memory loss.  OBJ- Physical Exam General- Alert, Oriented, Affect-appropriate, Distress- none acute Skin- rash-none, lesions- none, excoriation- none Lymphadenopathy- none Head- atraumatic            Eyes- Gross vision intact, PERRLA, conjunctivae and secretions clear            Ears- Hearing, canals-normal            Nose- Clear, no-Septal dev, mucus, polyps, erosion, perforation             Throat- Mallampati III , mucosa clear , drainage- none, tonsils- atrophic Neck- flexible , trachea midline, no stridor , thyroid  nl, carotid no bruit Chest - symmetrical excursion , unlabored           Heart/CV- RRR , no murmur , no gallop  , no rub, nl s1 s2                           - JVD- none , edema- none, stasis  changes- none, varices- none           Lung- clear to P&A, wheeze- none, cough- none , dullness-none, rub- none           Chest wall-  Abd-  Br/ Gen/ Rectal- Not done, not indicated Extrem- cyanosis- none, clubbing, none, atrophy- none, strength- nl Neuro- grossly intact to observation

## 2023-12-24 ENCOUNTER — Encounter: Payer: Self-pay | Admitting: Internal Medicine

## 2023-12-24 ENCOUNTER — Ambulatory Visit: Admitting: Internal Medicine

## 2023-12-24 VITALS — BP 112/62 | HR 84 | Temp 97.5°F | Ht 74.0 in | Wt 192.0 lb

## 2023-12-24 DIAGNOSIS — Z902 Acquired absence of lung [part of]: Secondary | ICD-10-CM | POA: Diagnosis not present

## 2023-12-24 DIAGNOSIS — C3412 Malignant neoplasm of upper lobe, left bronchus or lung: Secondary | ICD-10-CM | POA: Diagnosis not present

## 2023-12-24 DIAGNOSIS — G4733 Obstructive sleep apnea (adult) (pediatric): Secondary | ICD-10-CM

## 2023-12-24 DIAGNOSIS — R053 Chronic cough: Secondary | ICD-10-CM | POA: Diagnosis not present

## 2023-12-24 DIAGNOSIS — Z87891 Personal history of nicotine dependence: Secondary | ICD-10-CM

## 2023-12-24 DIAGNOSIS — C3492 Malignant neoplasm of unspecified part of left bronchus or lung: Secondary | ICD-10-CM | POA: Diagnosis not present

## 2023-12-24 NOTE — Patient Instructions (Signed)
 Order- DME [employer]- please replace old CPAP machine auto 10-15, please refit mask of choice, continue heated humidity, supplies, and please add AirView/ card

## 2023-12-25 ENCOUNTER — Encounter (HOSPITAL_COMMUNITY): Payer: Self-pay

## 2023-12-25 ENCOUNTER — Other Ambulatory Visit: Payer: Self-pay

## 2023-12-25 DIAGNOSIS — C3490 Malignant neoplasm of unspecified part of unspecified bronchus or lung: Secondary | ICD-10-CM

## 2023-12-26 ENCOUNTER — Inpatient Hospital Stay: Attending: Internal Medicine | Admitting: Internal Medicine

## 2023-12-26 ENCOUNTER — Inpatient Hospital Stay

## 2023-12-26 VITALS — BP 123/71 | HR 98 | Temp 98.0°F | Resp 17 | Wt 189.8 lb

## 2023-12-26 DIAGNOSIS — G8918 Other acute postprocedural pain: Secondary | ICD-10-CM | POA: Diagnosis not present

## 2023-12-26 DIAGNOSIS — G4730 Sleep apnea, unspecified: Secondary | ICD-10-CM | POA: Diagnosis not present

## 2023-12-26 DIAGNOSIS — C3490 Malignant neoplasm of unspecified part of unspecified bronchus or lung: Secondary | ICD-10-CM

## 2023-12-26 DIAGNOSIS — Z87891 Personal history of nicotine dependence: Secondary | ICD-10-CM | POA: Insufficient documentation

## 2023-12-26 DIAGNOSIS — I10 Essential (primary) hypertension: Secondary | ICD-10-CM | POA: Diagnosis not present

## 2023-12-26 DIAGNOSIS — C3412 Malignant neoplasm of upper lobe, left bronchus or lung: Secondary | ICD-10-CM | POA: Diagnosis not present

## 2023-12-26 LAB — CMP (CANCER CENTER ONLY)
ALT: 10 U/L (ref 0–44)
AST: 16 U/L (ref 15–41)
Albumin: 4.4 g/dL (ref 3.5–5.0)
Alkaline Phosphatase: 65 U/L (ref 38–126)
Anion gap: 6 (ref 5–15)
BUN: 16 mg/dL (ref 8–23)
CO2: 32 mmol/L (ref 22–32)
Calcium: 9.5 mg/dL (ref 8.9–10.3)
Chloride: 103 mmol/L (ref 98–111)
Creatinine: 0.92 mg/dL (ref 0.61–1.24)
GFR, Estimated: >60 mL/min (ref >60)
Glucose, Bld: 98 mg/dL (ref 70–99)
Potassium: 3.7 mmol/L (ref 3.5–5.1)
Sodium: 141 mmol/L (ref 135–145)
Total Bilirubin: 0.6 mg/dL (ref 0.0–1.2)
Total Protein: 7 g/dL (ref 6.5–8.1)

## 2023-12-26 LAB — CBC WITH DIFFERENTIAL (CANCER CENTER ONLY)
Abs Immature Granulocytes: 0.02 10*3/uL (ref 0.00–0.07)
Basophils Absolute: 0.1 10*3/uL (ref 0.0–0.1)
Basophils Relative: 1 %
Eosinophils Absolute: 0.2 10*3/uL (ref 0.0–0.5)
Eosinophils Relative: 2 %
HCT: 42.8 % (ref 39.0–52.0)
Hemoglobin: 14.8 g/dL (ref 13.0–17.0)
Immature Granulocytes: 0 %
Lymphocytes Relative: 21 %
Lymphs Abs: 1.6 10*3/uL (ref 0.7–4.0)
MCH: 30.6 pg (ref 26.0–34.0)
MCHC: 34.6 g/dL (ref 30.0–36.0)
MCV: 88.6 fL (ref 80.0–100.0)
Monocytes Absolute: 0.5 10*3/uL (ref 0.1–1.0)
Monocytes Relative: 7 %
Neutro Abs: 5.1 10*3/uL (ref 1.7–7.7)
Neutrophils Relative %: 69 %
Platelet Count: 200 10*3/uL (ref 150–400)
RBC: 4.83 MIL/uL (ref 4.22–5.81)
RDW: 12.6 % (ref 11.5–15.5)
WBC Count: 7.4 10*3/uL (ref 4.0–10.5)
nRBC: 0 % (ref 0.0–0.2)

## 2023-12-26 NOTE — Progress Notes (Signed)
 [facility] [phone]   [phone]  CONSULT NOTE  REFERRING PHYSICIAN: Dr. Brynda Greathouse  REASON FOR CONSULTATION:  73 years old white male recently diagnosed with lung cancer  HPI Nicholas Graham is a 73 y.o. male.  Discussed the use of AI scribe software for clinical note transcription with the patient, who gave verbal consent to proceed.  History of Present Illness   Nicholas Graham is a 73 year old male with lung cancer who presents for follow-up after surgery. He is accompanied by his wife, Olegario Messier.  Initially diagnosed with lung cancer following a series of diagnostic tests for abdominal pain, he experienced pain on the right side of his abdomen, leading to imaging studies including CT scans, x-rays, a HIDA scan, and a PET scan. These tests revealed a 3.6 cm mass in the left upper lobe of his lung, identified on a CT scan on August 29, 2023, and confirmed by a PET scan on September 25, 2023, which showed no change in size. Bronchoscopy followed by right upper lobectomy with lymph node dissection under the care of Dr. Delton Coombes and Dr. Cliffton Asters respectively showed adenocarcinoma measuring 0.3 cm with visceral pleural involvement and the sampled lymph nodes were negative for malignancy, with a final pathologic stage of T2A N0, or stage 1B. Molecular testing did not reveal any actionable mutations, and PD-L1 expression was noted at 1%.  Post-surgery, he experiences pain around the surgical site, which began 14 days after the procedure. The pain sometimes radiates around his abdomen. He manages this pain with heat application and medications including Percocet and gabapentin, although he uses these sparingly due to work commitments.  He has a persistent 'hacking cough' producing white sputum, for which he takes Robitussin, though it provides little relief. No shortness of breath, hemoptysis, weight loss, nausea, vomiting, diarrhea, headaches, or changes  in vision.  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 He quit smoking 21 years ago, seldom drinks alcohol, and denies drug use. He is allergic to penicillin, sulfa, Celebrex, and Cymbalta. Family history includes both parents with lung cancer, one brother with a heart attack, and another brother with bone cancer treated with radiation.        Past Medical History:  Diagnosis Date   Arm fracture    left. Dx'd w/RSD post fracture   Arthritis    Carpal tunnel syndrome    Chest pain 2016   [facility] (Dr. Marcelle Overlie) doing stress echo, but feels like musculoskeletal chest wall pain is cause of his discomfort   Chronic back pain    COVID-19 06/2020   Deviated septum    Diverticulosis    GERD (gastroesophageal reflux disease)    Hyperlipidemia    Hypertension    Joint pain    Muscle pain    OSA on CPAP 07/04/2018   - Trial of CPAP therapy on 17 cm H2O with a Large size Fisher[employer]Paykel Full Face Mask Simplus mask and heated humidification.   Plantar fasciitis    Pulmonary nodule 04/2016   [place].  Pt chose to repeat CT chest w/out contrast in 3 mo---as per radiologist's recommendations.   Right inguinal hernia    fat-containing.  Small hydrocele in left hemiscrotum.   Sleep apnea    Snoring    Swelling    of legs/feet/hands   Tobacco dependence in remission     Past Surgical History:  Procedure Laterality Date  BRONCHIAL BIOPSY  10/28/2023   Procedure: BRONCHIAL BIOPSIES;  Surgeon: Leslye Peer, MD;  Location: [facility] ENDOSCOPY;  Service: Pulmonary;;   BRONCHIAL BRUSHINGS  10/28/2023   Procedure: BRONCHIAL BRUSHINGS;  Surgeon: Leslye Peer, MD;  Location: [facility] ENDOSCOPY;  Service: Pulmonary;;   BRONCHIAL NEEDLE ASPIRATION BIOPSY  10/28/2023   Procedure: BRONCHIAL NEEDLE ASPIRATION BIOPSIES;  Surgeon: Leslye Peer, MD;  Location: [facility] ENDOSCOPY;  Service: Pulmonary;;   BRONCHIAL WASHINGS  10/28/2023   Procedure:  BRONCHIAL WASHINGS;  Surgeon: Leslye Peer, MD;  Location: [facility] ENDOSCOPY;  Service: Pulmonary;;   COLONOSCOPY     fracture repair left arm     had a fixator   INGUINAL HERNIA REPAIR Left 2013   repeat hernia repair. x2   INTERCOSTAL NERVE BLOCK  11/27/2023   Procedure: BLOCK, NERVE, INTERCOSTAL;  Surgeon: Corliss Skains, MD;  Location: [facility] OR;  Service: Thoracic;;   IR RADIOLOGIST EVAL & MGMT  07/22/2023   KNEE ARTHROSCOPY Right    LUMBAR DISC SURGERY  1993   LYMPH NODE BIOPSY  11/27/2023   Procedure: LYMPH NODE BIOPSY;  Surgeon: Corliss Skains, MD;  Location: [facility] OR;  Service: Thoracic;;   NEURECTOMY inguinal after hernia repair Left    PLANTAR FASCIA SURGERY Right 2000s   SHOULDER ARTHROSCOPY Right    UPPER GI ENDOSCOPY      Family History  Problem Relation Age of Onset   Lung cancer Mother    Lung cancer Father    Bone cancer Brother        bone   Heart disease Maternal Uncle    Colon cancer Neg Hx    Esophageal cancer Neg Hx    Stomach cancer Neg Hx     Social History Social History   Tobacco Use   Smoking status: Former    Current packs/day: 0.00    Average packs/day: 3.0 packs/day for 30.0 years (90.0 ttl pk-yrs)    Types: Cigarettes    Start date: 07/16/1972    Quit date: 07/16/2002    Years since quitting: 21.4   Smokeless tobacco: Never  Vaping Use   Vaping status: Never Used  Substance Use Topics   Alcohol use: Yes    Alcohol/week: 1.0 - 2.0 standard drink of alcohol    Types: 1 - 2 Cans of beer per week    Comment: socially   Drug use: No    Allergies  Allergen Reactions   Penicillins Hives   Celebrex [Celecoxib] Other (See Comments)    unknown reaction   Cymbalta [Duloxetine Hcl] Other (See Comments)    unknown    Current Outpatient Medications  Medication Sig Dispense Refill   amLODipine (NORVASC) 10 MG tablet Take 1 tablet (10 mg total) by mouth daily. 90 tablet 1   aspirin 81 MG tablet Take 81 mg by mouth daily.     baclofen  (LIORESAL) 20 MG tablet Take 0.5-1 tablets (10-20 mg total) by mouth 3 (three) times daily. 270 tablet 1   escitalopram (LEXAPRO) 20 MG tablet Take 1 tablet (20 mg total) by mouth daily. 90 tablet 1   famotidine (PEPCID) 20 MG tablet Take 1 tablet (20 mg total) by mouth 2 (two) times daily. 180 tablet 1   gabapentin (NEURONTIN) 600 MG tablet Take 1 tablet (600 mg total) by mouth 3 (three) times daily. 270 tablet 1   hydrochlorothiazide (HYDRODIURIL) 25 MG tablet Take 1 tablet (25 mg total) by mouth daily. 90 tablet 1   ibuprofen (ADVIL)  200 MG tablet Take 2 tablets (400 mg total) by mouth every 8 (eight) hours as needed for mild pain (pain score 1-3).     lisinopril (ZESTRIL) 10 MG tablet Take 1 tablet (10 mg total) by mouth daily. 90 tablet 1   MegaRed Omega-3 Krill Oil 500 MG CAPS Take 2 capsules by mouth daily.     MYRBETRIQ 50 MG TB24 tablet Take 50 mg by mouth daily.     oxyCODONE-acetaminophen (PERCOCET) 10-325 MG tablet Take 1 tablet by mouth every 8 (eight) hours as needed for pain. 90 tablet 0   pantoprazole (PROTONIX) 40 MG tablet Take 1 tablet (40 mg total) by mouth daily. 90 tablet 1   simvastatin (ZOCOR) 10 MG tablet Take 1 tablet (10 mg total) by mouth every other day. 45 tablet 1   No current facility-administered medications for this visit.   Facility-Administered Medications Ordered in Other Visits  Medication Dose Route Frequency Provider Last Rate Last Admin   regadenoson (LEXISCAN) injection SOLN 0.4 mg  0.4 mg Intravenous Once Tobb, Kardie, DO       technetium tetrofosmin (TC-MYOVIEW) injection 30.2 millicurie  30.2 millicurie Intravenous Once PRN Tobb, Kardie, DO        Review of Systems  Constitutional: positive for fatigue Eyes: negative Ears, nose, mouth, throat, and face: negative Respiratory: positive for dyspnea on exertion and pleurisy/chest pain Cardiovascular: negative Gastrointestinal: negative Genitourinary:negative Integument/breast:  negative Hematologic/lymphatic: negative Musculoskeletal:negative Neurological: negative Behavioral/Psych: negative Endocrine: negative Allergic/Immunologic: negative  Physical Exam  ZOX:WRUEA, healthy, no distress, well nourished, and well developed SKIN: skin color, texture, turgor are normal, no rashes or significant lesions HEAD: Normocephalic, No masses, lesions, tenderness or abnormalities EYES: normal, PERRLA, Conjunctiva are pink and non-injected EARS: External ears normal, Canals clear OROPHARYNX:no exudate, no erythema, and lips, buccal mucosa, and tongue normal  NECK: supple, no adenopathy, no JVD LYMPH:  no palpable lymphadenopathy, no hepatosplenomegaly LUNGS: clear to auscultation , and palpation HEART: regular rate & rhythm, no murmurs, and no gallops ABDOMEN:abdomen soft, non-tender, normal bowel sounds, and no masses or organomegaly BACK: Back symmetric, no curvature., No CVA tenderness EXTREMITIES:no joint deformities, effusion, or inflammation, no edema  NEURO: alert & oriented x 3 with fluent speech, no focal motor/sensory deficits  PERFORMANCE STATUS: ECOG 1  LABORATORY DATA: Lab Results  Component Value Date   WBC 7.4 12/26/2023   HGB 14.8 12/26/2023   HCT 42.8 12/26/2023   MCV 88.6 12/26/2023   PLT 200 12/26/2023      Chemistry      Component Value Date/Time   NA 137 11/28/2023 0246   NA 138 07/06/2018 0000   K 4.4 11/28/2023 0246   CL 102 11/28/2023 0246   CO2 28 11/28/2023 0246   BUN 14 11/28/2023 0246   BUN 17 07/06/2018 0000   CREATININE 0.93 11/28/2023 0246   GLU 124 07/06/2018 0000      Component Value Date/Time   CALCIUM 8.9 11/28/2023 0246   ALKPHOS 52 11/25/2023 1340   AST 22 11/25/2023 1340   ALT 15 11/25/2023 1340   BILITOT 1.1 11/25/2023 1340       RADIOGRAPHIC STUDIES: DG Chest 2 View Result Date: 11/28/2023 CLINICAL DATA:  Status post lobectomy of lung.  Chest tube. EXAM: CHEST - 2 VIEW COMPARISON:  Radiograph earlier  today FINDINGS: Left-sided chest tube directed towards the apex. No visible pneumothorax. Postsurgical change at the left hilum with streaky atelectasis in the mid lung. No significant pleural effusion. Stable heart size and mediastinal  contours. There is mild atelectasis at the right lung base. IMPRESSION: 1. Left-sided chest tube. No visible pneumothorax. 2. Postsurgical change at the left hilum. Scattered bilateral atelectasis. Electronically Signed   By: Narda Rutherford M.D.   On: 11/28/2023 17:03   DG Chest Port 1 View Result Date: 11/28/2023 CLINICAL DATA:  Status post lobectomy. EXAM: PORTABLE CHEST 1 VIEW COMPARISON:  November 27, 2023. FINDINGS: Stable cardiomediastinal silhouette. Minimal right basilar subsegmental atelectasis is noted. Left-sided chest tube is noted without definite pneumothorax. Minimal left basilar subsegmental atelectasis is noted IMPRESSION: Left-sided chest tube is noted without definite pneumothorax. Minimal bibasilar subsegmental atelectasis. Electronically Signed   By: Lupita Raider M.D.   On: 11/28/2023 10:13   DG Chest Port 1 View Result Date: 11/27/2023 CLINICAL DATA:  Status post lobectomy. EXAM: PORTABLE CHEST 1 VIEW COMPARISON:  Chest radiograph dated 11/25/2023. FINDINGS: Left-sided chest tube with tip over the left apex. Surgical suture noted over the left hilum. No focal consolidation, or pleural effusion. No detectable pneumothorax. The cardiac silhouette is within limits. No acute osseous pathology. IMPRESSION: Left-sided chest tube. No detectable pneumothorax. Electronically Signed   By: Elgie Collard M.D.   On: 11/27/2023 16:21    ASSESSMENT AND PLAN:    Non-small cell lung cancer, adenocarcinoma, stage 1B Diagnosed with non-small cell lung cancer, adenocarcinoma, in the left upper lobe. Tumor measured 3.3 cm, invading the visceral pleura, pathologic stage T2A N0, stage 1B. Molecular testing showed no actionable mutations, PD-L1 expression 1%. Underwent  curative surgical resection of the left upper lobe. No adjuvant chemotherapy, targeted therapy, immune therapy, or radiation needed due to tumor size <4 cm and complete resection, reducing recurrence risk. - Perform chest CT every 6 months for the first 2 years. - Schedule follow-up visits every 6 months for the first 2 years. - After 2 years, if stable, perform annual chest CT and follow-up visits until 5 years post-surgery.  Post-surgical pain Experiencing pain around the surgical site, likely due to scarring from recent lung surgery. Pain management includes heat and medications such as Percocet and gabapentin. - Continue pain management with heat application, Percocet, and gabapentin as needed.  Chronic cough Persistent cough producing white sputum, described as hacking and aggravating. Robitussin not providing relief. - Switch to Delsym, an over-the-counter option, to alleviate the cough.  Hypertension, hyperlipidemia, sleep apnea, GERD, chronic back pain, arthritis Chronic conditions including hypertension, hyperlipidemia, sleep apnea, GERD, chronic back pain, and arthritis are managed by the primary care provider. - Continue management of chronic conditions with primary care provider.   The patient was advised to call immediately if he has any other concerning symptoms in the interval. The patient voices understanding of current disease status and treatment options and is in agreement with the current care plan.  All questions were answered. The patient knows to call the clinic with any problems, questions or concerns. We can certainly see the patient much sooner if necessary.  Thank you so much for allowing me to participate in the care of Nicholas Graham. I will continue to follow up the patient with you and assist in his care.  The total time spent in the appointment was 60 minutes.  Disclaimer: This note was dictated with voice recognition software. Similar sounding words can  inadvertently be transcribed and may not be corrected upon review.   Lajuana Matte December 26, 2023, 2:09 PM

## 2024-01-11 ENCOUNTER — Encounter: Payer: Self-pay | Admitting: Internal Medicine

## 2024-01-16 ENCOUNTER — Other Ambulatory Visit: Payer: Self-pay | Admitting: Thoracic Surgery (Cardiothoracic Vascular Surgery)

## 2024-01-16 DIAGNOSIS — Z902 Acquired absence of lung [part of]: Secondary | ICD-10-CM

## 2024-01-17 ENCOUNTER — Encounter: Payer: Self-pay | Admitting: Thoracic Surgery (Cardiothoracic Vascular Surgery)

## 2024-01-17 ENCOUNTER — Ambulatory Visit
Payer: Self-pay | Attending: Thoracic Surgery (Cardiothoracic Vascular Surgery) | Admitting: Thoracic Surgery (Cardiothoracic Vascular Surgery)

## 2024-01-17 ENCOUNTER — Ambulatory Visit (HOSPITAL_COMMUNITY)
Admission: RE | Admit: 2024-01-17 | Discharge: 2024-01-17 | Disposition: A | Discharge status: Home / Self Care | Source: Ambulatory Visit | Attending: Thoracic Surgery (Cardiothoracic Vascular Surgery)

## 2024-01-17 VITALS — BP 116/66 | HR 76 | Resp 20 | Ht 74.0 in | Wt 190.7 lb

## 2024-01-17 DIAGNOSIS — Z4682 Encounter for fitting and adjustment of non-vascular catheter: Secondary | ICD-10-CM | POA: Diagnosis not present

## 2024-01-17 DIAGNOSIS — Z902 Acquired absence of lung [part of]: Secondary | ICD-10-CM | POA: Diagnosis not present

## 2024-01-17 DIAGNOSIS — R918 Other nonspecific abnormal finding of lung field: Secondary | ICD-10-CM | POA: Diagnosis not present

## 2024-01-17 DIAGNOSIS — R911 Solitary pulmonary nodule: Secondary | ICD-10-CM | POA: Diagnosis not present

## 2024-01-22 NOTE — Progress Notes (Signed)
      [address]       [place] [id number]             [phone]        SEQUOYAH CLYATT [facility] Medical Record #[id number] Date of Birth: 06-Apr-1951  Referring: Denson Flake, MD Primary Care: Mariel Shope, DO Primary Cardiologist:None  Reason for visit:   follow-up  History of Present Illness:     73yo male presents for his 1 month follow-up appointment.  Overall, he is doing well.  He has no complaints  Physical Exam: BP 116/66 (BP Location: Right Arm, Patient Position: Sitting, Cuff Size: Normal)   Pulse 76   Resp 20   Ht 6\' 2"  (1.88 m)   Wt 190 lb 11.2 oz (86.5 kg)   SpO2 95%   BMI 24.48 kg/m   Alert NAD Incision clean.   Abdomen, ND no peripheral edema   Diagnostic Studies & Laboratory data: CXR: clear     Assessment / Plan:   73 yo male  s/p LULectomy for  T2aN0M0 Adenocarcinoma  Stage 1b. He will continue to follow with medical oncology   Hilarie Lovely 01/22/2024 7:53 PM

## 2024-02-04 DIAGNOSIS — G4733 Obstructive sleep apnea (adult) (pediatric): Secondary | ICD-10-CM | POA: Diagnosis not present

## 2024-02-06 ENCOUNTER — Ambulatory Visit (INDEPENDENT_AMBULATORY_CARE_PROVIDER_SITE_OTHER): Admitting: Family Medicine

## 2024-02-06 ENCOUNTER — Encounter: Payer: Self-pay | Admitting: Family Medicine

## 2024-02-06 VITALS — BP 130/76 | HR 78 | Temp 97.9°F | Wt 192.2 lb

## 2024-02-06 DIAGNOSIS — G4733 Obstructive sleep apnea (adult) (pediatric): Secondary | ICD-10-CM | POA: Diagnosis not present

## 2024-02-06 DIAGNOSIS — M4802 Spinal stenosis, cervical region: Secondary | ICD-10-CM

## 2024-02-06 DIAGNOSIS — C3412 Malignant neoplasm of upper lobe, left bronchus or lung: Secondary | ICD-10-CM

## 2024-02-06 DIAGNOSIS — I10 Essential (primary) hypertension: Secondary | ICD-10-CM

## 2024-02-06 DIAGNOSIS — M5134 Other intervertebral disc degeneration, thoracic region: Secondary | ICD-10-CM

## 2024-02-06 DIAGNOSIS — Z902 Acquired absence of lung [part of]: Secondary | ICD-10-CM | POA: Diagnosis not present

## 2024-02-06 DIAGNOSIS — E785 Hyperlipidemia, unspecified: Secondary | ICD-10-CM | POA: Diagnosis not present

## 2024-02-06 DIAGNOSIS — G8929 Other chronic pain: Secondary | ICD-10-CM | POA: Diagnosis not present

## 2024-02-06 DIAGNOSIS — J449 Chronic obstructive pulmonary disease, unspecified: Secondary | ICD-10-CM | POA: Diagnosis not present

## 2024-02-06 DIAGNOSIS — Z0289 Encounter for other administrative examinations: Secondary | ICD-10-CM

## 2024-02-06 DIAGNOSIS — M5090 Cervical disc disorder, unspecified, unspecified cervical region: Secondary | ICD-10-CM

## 2024-02-06 DIAGNOSIS — I700 Atherosclerosis of aorta: Secondary | ICD-10-CM

## 2024-02-06 DIAGNOSIS — K2100 Gastro-esophageal reflux disease with esophagitis, without bleeding: Secondary | ICD-10-CM | POA: Diagnosis not present

## 2024-02-06 DIAGNOSIS — F419 Anxiety disorder, unspecified: Secondary | ICD-10-CM

## 2024-02-06 MED ORDER — OXYCODONE-ACETAMINOPHEN 10-325 MG PO TABS: 1.0000 | ORAL_TABLET | Freq: Three times a day (TID) | ORAL | 0 refills | Status: DC | PRN

## 2024-02-06 MED ORDER — SIMVASTATIN 10 MG PO TABS: 10.0000 mg | ORAL_TABLET | ORAL | 1 refills | Status: DC

## 2024-02-06 NOTE — Progress Notes (Signed)
 Patient ID: Nicholas Graham, male  DOB: 1951/04/27, 73 y.o.   MRN: [id number] Patient Care Team    Relationship Specialty Notifications Start End  Mariel Shope, DO PCP - General Family Medicine  10/31/15   Adah Acron, MD (Inactive) Consulting Physician Orthopedic Surgery  10/26/21   Armbruster, Lendon Queen, MD Consulting Physician Gastroenterology  05/29/23   Roxane Copp, MD Consulting Physician Urology  05/29/23   Faustina Hood, MD Consulting Physician Pulmonary Disease  10/08/23   Hilarie Lovely, MD Consulting Physician Cardiothoracic Surgery  11/21/23   Syliva Even, MD Consulting Physician Sports Medicine  11/21/23     Chief Complaint  Patient presents with   Hypertension    Subjective:  Nicholas Graham is a 73 y.o. male present for Chronic Conditions/illness Management  All past medical history, surgical history, allergies, family history, immunizations, medications and social history were updated in the electronic medical record today. All recent labs, ED visits and hospitalizations within the last year were reviewed.   Cervical neck pain with evidence of disc disease/Chronic midline thoracic back pain/Thoracic degenerative disc disease/DDD (degenerative disc disease), lumbar/History of lumbar discectomy Pt presents for his chronic pain management treatment for his cervical and lumbar conditions. His discomfort has progressed since 2020-current regimen does provide some relief, allows him to be active  He has been evaluated  by neuro and pain rehab. He has received injections- that have only provided a few days of temporary relief in the past . He is frustrated, but has appropriate expectations that he will be living with some pain.  He reports he is maintaining a quality of life with the use of opiates, that allows him to remain active with Percocet 10-325 (1 tab every 8-12 hours), baclofen  and gabapentin . 600/600/600 (had headaches at increased doses).  Indication  for chronic opioid: Cervical spine degeneration, chronic neck and back pain. Medication and dose: Oxycodone -acetaminophen  10-325 1 tab TID prn daily # pills per month: 60 Last UDS date:UYD 08/2023 Pain contract signed (Y/N): Yes- UTD-08/2023 Date narcotic database last reviewed (/include red flags): 02/06/24  03/08/2021  had epidural injection of cervical spine and he feels it is helpful.  11/2021-has had repeat injections that have not been as helpful. 08/2023: Dr, Martina Sledge- performed injections.  Original note: Lower cervical to mid thoracic pain still present. Using his right arm exacerbates his pain midline of thoracic.  He has a history thoracic spine degeneration and discectomy in his lumbar spine.  Has degenerative changes, bone spurring and facet arthropathy in his lumbar spine by x-rays obtained 11/2018. Cervical spine x-ray with mild bilateral foraminal narrowing at C3-C4 11/2018. Unfortunately he was reported as allergic or intolerant to many of the medications that would be helpful for him such as gabapentin , Lyrica, Celebrex and Cymbalta --> we discussed this in more detail last visit and he believes that the "allergy-hallucinations " was secondary to having multiple of these medicines on board at the same time back in 2014. We tried to start gabapentin  and it did not have any side effects from medication. It has not been extremely helpful  Yet at lower doses. He admits to rather sigmnificant stomach upset starting from the naproxen  BID use ober the last 8 week. Advanced imaging has not bee able to be completed 2/2 to covid outbreak. He desired to wait on referral to specialist last visit in hope that he would receive benefit from the gabapentin .   Hypertension/HLD/overweight: Pt reports compliance with  amlodipine  10 mg daily, lisinopril  10 mg QD and HCTZ.  Patient denies chest pain, shortness of breath, dizziness or lower extremity edema.  Pt takes a daily baby ASA. Pt is  prescribed statin.   Labs UTD Diet: Low-sodium Exercise: Not exercising as routinely. RF: Hypertension, hyperlipidemia, family history of heart disease. Overweight.   ANXIETY/sleep disorder:  Patient reports he is feeling well on Lexapro  20 mg daily      11/06/2023   11:23 AM 10/08/2023    8:19 AM 06/19/2023    9:02 AM 01/18/2023   10:26 AM 11/13/2022    8:16 AM  Depression screen PHQ 2/9  Decreased Interest 0 0 0 0 0  Down, Depressed, Hopeless 0 0 0 0 0  PHQ - 2 Score 0 0 0 0 0      11/30/2021    8:32 AM 01/14/2020    8:20 AM 12/16/2019    8:06 AM 06/23/2019    8:30 AM  GAD 7 : Generalized Anxiety Score  Nervous, Anxious, on Edge 0 0 0 0  Control/stop worrying 0 0 0 0  Worry too much - different things 0 0 0 0  Trouble relaxing 0 3 0 0  Restless 0 3 3 1   Easily annoyed or irritable 0 1 0 0  Afraid - awful might happen 0 0 0 0  Total GAD 7 Score 0 7 3 1   Anxiety Difficulty  Not difficult at all Somewhat difficult Not difficult at all            02/06/2024    7:57 AM 11/04/2023   10:37 AM 10/10/2023    4:08 PM 10/08/2023    8:19 AM 06/19/2023    9:02 AM  Fall Risk   Falls in the past year? 0 0 0 0 0  Number falls in past yr:    0 0  Injury with Fall?    0 0  Risk for fall due to :    No Fall Risks No Fall Risks  Follow up Falls evaluation completed   Falls evaluation completed Falls evaluation completed       There is no immunization history on file for this patient.   Past Medical History:  Diagnosis Date   Arm fracture    left. Dx'd w/RSD post fracture   Arthritis    Carpal tunnel syndrome    Chest pain 2016   [facility] (Dr. Steve El) doing stress echo, but feels like musculoskeletal chest wall pain is cause of his discomfort   Chronic back pain    COVID-19 06/2020   Deviated septum    Diverticulosis    GERD (gastroesophageal reflux disease)    Hyperlipidemia    Hypertension    Joint pain    Muscle pain    OSA on CPAP 07/04/2018   - Trial of CPAP therapy on 17  cm H2O with a Large size Fisher[employer]kel Full Face Mask Simplus mask and heated humidification.   Plantar fasciitis    Pulmonary nodule 04/2016   Lingula.  Pt chose to repeat CT chest w/out contrast in 3 mo---as per radiologist's recommendations.   Right inguinal hernia    fat-containing.  Small hydrocele in left hemiscrotum.   Sleep apnea    Snoring    Swelling    of legs/feet/hands   Tobacco dependence in remission    Allergies  Allergen Reactions   Penicillins Hives   Celebrex [Celecoxib] Other (See Comments)    unknown reaction   Cymbalta [Duloxetine Hcl]  Other (See Comments)    unknown   Past Surgical History:  Procedure Laterality Date   BRONCHIAL BIOPSY  10/28/2023   Procedure: BRONCHIAL BIOPSIES;  Surgeon: Denson Flake, MD;  Location: [facility] ENDOSCOPY;  Service: Pulmonary;;   BRONCHIAL BRUSHINGS  10/28/2023   Procedure: BRONCHIAL BRUSHINGS;  Surgeon: Denson Flake, MD;  Location: [facility] ENDOSCOPY;  Service: Pulmonary;;   BRONCHIAL NEEDLE ASPIRATION BIOPSY  10/28/2023   Procedure: BRONCHIAL NEEDLE ASPIRATION BIOPSIES;  Surgeon: Denson Flake, MD;  Location: [facility] ENDOSCOPY;  Service: Pulmonary;;   BRONCHIAL WASHINGS  10/28/2023   Procedure: BRONCHIAL WASHINGS;  Surgeon: Denson Flake, MD;  Location: [facility] ENDOSCOPY;  Service: Pulmonary;;   COLONOSCOPY     fracture repair left arm     had a fixator   INGUINAL HERNIA REPAIR Left 2013   repeat hernia repair. x2   INTERCOSTAL NERVE BLOCK  11/27/2023   Procedure: BLOCK, NERVE, INTERCOSTAL;  Surgeon: Hilarie Lovely, MD;  Location: [facility] OR;  Service: Thoracic;;   IR RADIOLOGIST EVAL & MGMT  07/22/2023   KNEE ARTHROSCOPY Right    LUMBAR DISC SURGERY  1993   LYMPH NODE BIOPSY  11/27/2023   Procedure: LYMPH NODE BIOPSY;  Surgeon: Hilarie Lovely, MD;  Location: [facility] OR;  Service: Thoracic;;   NEURECTOMY inguinal after hernia repair Left    PLANTAR FASCIA SURGERY Right 2000s   SHOULDER ARTHROSCOPY Right    UPPER GI ENDOSCOPY      Family History  Problem Relation Age of Onset   Lung cancer Mother    Lung cancer Father    Bone cancer Brother        bone   Heart disease Maternal Uncle    Colon cancer Neg Hx    Esophageal cancer Neg Hx    Stomach cancer Neg Hx    Social History   Social History Narrative   Married, [place]. Children (2) Adult, 4 grandchildren.    9 th grade, Retired.   Wears seatbelt   Smoke detector in the home.    Firearms locked in the home.    Feels safe in his relationships.     Allergies as of 02/06/2024       Reactions   Penicillins Hives   Celebrex [celecoxib] Other (See Comments)   unknown reaction   Cymbalta [duloxetine Hcl] Other (See Comments)   unknown        Medication List        Accurate as of Feb 06, 2024  8:22 AM. If you have any questions, ask your nurse or doctor.          amLODipine  10 MG tablet Commonly known as: NORVASC  Take 1 tablet (10 mg total) by mouth daily.   aspirin  81 MG tablet Take 81 mg by mouth daily.   baclofen  20 MG tablet Commonly known as: LIORESAL  Take 0.5-1 tablets (10-20 mg total) by mouth 3 (three) times daily.   escitalopram  20 MG tablet Commonly known as: LEXAPRO  Take 1 tablet (20 mg total) by mouth daily.   gabapentin  600 MG tablet Commonly known as: NEURONTIN  Take 1 tablet (600 mg total) by mouth 3 (three) times daily.   hydrochlorothiazide  25 MG tablet Commonly known as: HYDRODIURIL  Take 1 tablet (25 mg total) by mouth daily.   lisinopril  10 MG tablet Commonly known as: ZESTRIL  Take 1 tablet (10 mg total) by mouth daily.   MegaRed Omega-3 Krill Oil 500 MG Caps Take 2 capsules by mouth daily.   Myrbetriq  50  MG Tb24 tablet Generic drug: mirabegron  ER Take 50 mg by mouth daily.   oxyCODONE -acetaminophen  10-325 MG tablet Commonly known as: PERCOCET Take 1 tablet by mouth every 8 (eight) hours as needed for pain. What changed: You were already taking a medication with the same name, and this  prescription was added. Make sure you understand how and when to take each. Changed by: Randy Whitener   oxyCODONE -acetaminophen  10-325 MG tablet Commonly known as: PERCOCET Take 1 tablet by mouth every 8 (eight) hours as needed for pain. Start taking on: February 29, 2024 What changed: You were already taking a medication with the same name, and this prescription was added. Make sure you understand how and when to take each. Changed by: Nakima Fluegge   oxyCODONE -acetaminophen  10-325 MG tablet Commonly known as: PERCOCET Take 1 tablet by mouth every 8 (eight) hours as needed for pain. Start taking on: March 22, 2024 What changed: These instructions start on March 22, 2024. If you are unsure what to do until then, ask your doctor or other care provider. Changed by: Napolean Backbone   pantoprazole  40 MG tablet Commonly known as: PROTONIX  Take 1 tablet (40 mg total) by mouth daily.   simvastatin  10 MG tablet Commonly known as: ZOCOR  Take 1 tablet (10 mg total) by mouth every other day.       All past medical history, surgical history, allergies, family history, immunizations andmedications were updated in the EMR today and reviewed under the history and medication portions of their EMR.      MR SHOULDER RIGHT WO CONTRAST Result Date: 05/03/2022 IMPRESSION: 1. Postsurgical changes consistent with interval rotator cuff repair and probable bicipital tenodesis with superior labral debridement. Mild supraspinatus and infraspinatus tendinosis without recurrent rotator cuff tear or muscular atrophy. 2. Interval development of prominent subcortical cyst formation and surrounding edema inferiorly in the acromion with associated subacromial-subdeltoid bursitis. Electronically Signed   By: Elmon Hagedorn M.D.   On: 05/03/2022 13:24    ROS 14 pt review of systems performed and negative (unless mentioned in an HPI)  Objective: BP 130/76   Pulse 78   Temp 97.9 F (36.6 C)   Wt 192 lb 3.2 oz (87.2 kg)    SpO2 97%   BMI 24.68 kg/m  Physical Exam Vitals and nursing note reviewed.  Constitutional:      General: He is not in acute distress.    Appearance: Normal appearance. He is not ill-appearing, toxic-appearing or diaphoretic.  HENT:     Head: Normocephalic and atraumatic.  Eyes:     General: No scleral icterus.       Right eye: No discharge.        Left eye: No discharge.     Extraocular Movements: Extraocular movements intact.     Pupils: Pupils are equal, round, and reactive to light.  Cardiovascular:     Rate and Rhythm: Normal rate and regular rhythm.     Heart sounds: No murmur heard. Pulmonary:     Effort: Pulmonary effort is normal. No respiratory distress.     Breath sounds: Normal breath sounds. No wheezing, rhonchi or rales.  Musculoskeletal:     Right lower leg: No edema.     Left lower leg: No edema.  Skin:    General: Skin is warm.     Findings: No rash.  Neurological:     Mental Status: He is alert and oriented to person, place, and time. Mental status is at baseline.  Psychiatric:  Mood and Affect: Mood normal.        Behavior: Behavior normal.        Thought Content: Thought content normal.        Judgment: Judgment normal.     No results found.  Assessment/plan: Nicholas Graham is a 73 y.o. male present for chronic condition management Cervical neck pain with evidence of disc disease/Chronic midline thoracic back pain/Thoracic degenerative disc disease/DDD (degenerative disc disease), lumbar/History of lumbar discectomy He is following with sports medicine, physical therapy and orthopedics.  - current pain management is helping him maintain activity and improve his quality of life. -[place]  controlled substance database reviewed and appropriate 02/06/24 - Pain contract signed > UTD - UDS > UTD NSAIDs contraindicated with gastritis Continue gabapentin   600/600/600> did not tolerate higher dose (headache) Continue baclofen  10-20 mg 3 times  daily Continue percocet 10-325 mg tID #90 prescribed with 2 refills (held) at pharmacy.  - f/u 3 mos   Essential hypertension, benign/dyslipidemia/overweight Stable Continue  amlodipine  10 mg daily  Continue HCTZ. Continue Zocor  10 mg q. OD - continue fish oil. - continue ASA 81 mg - routine diet and exercise. Labs up-to-date 06-2023   Gastroesophageal reflux disease with esophagitis Continue Protonix  twice daily, Pepcid  twice daily Avoid all NSAIDs.  Caution on spicy foods and alcohol use.   Anxiety/sleep disturbance:  Stable Continue lexapro  20 mg qd.   OSA on CPAP Compliant  Malignant neoplasm of upper lobe of left lung: S/p partial long left lobe lobectomy Follow w/up onc q 6 mos.   Return in about 12 weeks (around 04/30/2024) for Routine chronic condition follow-up.   No orders of the defined types were placed in this encounter.  Meds ordered this encounter  Medications   simvastatin  (ZOCOR ) 10 MG tablet    Sig: Take 1 tablet (10 mg total) by mouth every other day.    Dispense:  45 tablet    Refill:  1   oxyCODONE -acetaminophen  (PERCOCET) 10-325 MG tablet    Sig: Take 1 tablet by mouth every 8 (eight) hours as needed for pain.    Dispense:  90 tablet    Refill:  0   oxyCODONE -acetaminophen  (PERCOCET) 10-325 MG tablet    Sig: Take 1 tablet by mouth every 8 (eight) hours as needed for pain.    Dispense:  90 tablet    Refill:  0   oxyCODONE -acetaminophen  (PERCOCET) 10-325 MG tablet    Sig: Take 1 tablet by mouth every 8 (eight) hours as needed for pain.    Dispense:  90 tablet    Refill:  0   Referral Orders  No referral(s) requested today     Note is dictated utilizing voice recognition software. Although note has been proof read prior to signing, occasional typographical errors still can be missed. If any questions arise, please do not hesitate to call for verification.  Electronically signed by: Napolean Backbone, DO [facility] [place]

## 2024-02-06 NOTE — Patient Instructions (Addendum)
 Return in about 12 weeks (around 04/30/2024) for Routine chronic condition follow-up.        Great to see you today.  I have refilled the medication(s) we provide.   If labs were collected or images ordered, we will inform you of  results once we have received them and reviewed. We will contact you either by echart message, or telephone call.  Please give ample time to the testing facility, and our office to run,  receive and review results. Please do not call inquiring of results, even if you can see them in your chart. We will contact you as soon as we are able. If it has been over 1 week since the test was completed, and you have not yet heard from us , then please call us .    - echart message- for normal results that have been seen by the patient already.   - telephone call: abnormal results or if patient has not viewed results in their echart.  If a referral to a specialist was entered for you, please call us  in 2 weeks if you have not heard from the specialist office to schedule.

## 2024-02-20 ENCOUNTER — Other Ambulatory Visit: Payer: Self-pay | Admitting: Family Medicine

## 2024-03-04 ENCOUNTER — Other Ambulatory Visit: Payer: Self-pay

## 2024-03-04 ENCOUNTER — Ambulatory Visit (INDEPENDENT_AMBULATORY_CARE_PROVIDER_SITE_OTHER)

## 2024-03-04 ENCOUNTER — Ambulatory Visit: Admitting: Family Medicine

## 2024-03-04 VITALS — BP 126/74 | HR 77 | Ht 74.0 in | Wt 189.0 lb

## 2024-03-04 DIAGNOSIS — M19011 Primary osteoarthritis, right shoulder: Secondary | ICD-10-CM | POA: Diagnosis not present

## 2024-03-04 DIAGNOSIS — G8929 Other chronic pain: Secondary | ICD-10-CM | POA: Diagnosis not present

## 2024-03-04 DIAGNOSIS — M542 Cervicalgia: Secondary | ICD-10-CM

## 2024-03-04 DIAGNOSIS — M25511 Pain in right shoulder: Secondary | ICD-10-CM | POA: Diagnosis not present

## 2024-03-04 DIAGNOSIS — I700 Atherosclerosis of aorta: Secondary | ICD-10-CM | POA: Diagnosis not present

## 2024-03-04 DIAGNOSIS — M25711 Osteophyte, right shoulder: Secondary | ICD-10-CM | POA: Diagnosis not present

## 2024-03-04 NOTE — Patient Instructions (Addendum)
Thank you for coming in today.   Please get an Xray today before you leave   I've referred you to Physical Therapy.  Let us know if you don't hear from them in one week.   Check back as needed 

## 2024-03-04 NOTE — Progress Notes (Signed)
 Joanna Muck, PhD, LAT, ATC acting as a scribe for Garlan Juniper, MD.  Nicholas Graham is a 73 y.o. male who presents to [facility] today for exacerbation of his R shoulder pain and cervicalgia. Pt was last seen by Dr. Alease Hunter on 08/20/23 and a cervical ESI and chest CT were ordered. Last R subacromial steroid injection, 07/25/23.  Today, pt reports R shoulder and pain returned over the last 2 wks. Pt locates pain to the R-side of his neck and all over R shoulder joint. He notes there was a knot in her R trapz area, but this seems to have resolved.  He notes he is recovering well from his partial left lobectomy and has been walking daily.  He had a diagnosis of adeno lung cancer fortunately caught early enough that it was stage 1B and was treated successfully with a partial lobectomy about 6 months ago.  Dx imaging: 07/25/23 R shoulder & R rib/chest XR             05/01/22 R shoulder MRI             03/13/22 C-spine MRI             10/26/21 R shoulder XR  Pertinent review of systems: No fevers or chills.  Consistent right upper quadrant abdominal pain.  Relevant historical information: History of lung cancer left upper lobe.   Exam:  BP 126/74   Pulse 77   Ht 6' 2 (1.88 m)   Wt 189 lb (85.7 kg)   SpO2 97%   BMI 24.27 kg/m  General: Well Developed, well nourished, and in no acute distress.   MSK: C-Spine: Normal appearing Nontender to palpation spinal midline.  Tender palpation right cervical paraspinal musculature and trapezius. Decreased cervical motion.   Right shoulder mature scar superior shoulder.  Nontender palpation.  Decreased shoulder motion.  Upper extremity strength is intact.  Lab and Radiology Results  Procedure: Real-time Ultrasound Guided Injection of right shoulder subacromial bursa Device: Philips Affiniti 50G/GE Logiq Images permanently stored and available for review in PACS Verbal informed consent obtained.  Discussed  risks and benefits of procedure. Warned about infection, bleeding, hyperglycemia damage to structures among others. Patient expresses understanding and agreement Time-out conducted.   Noted no overlying erythema, induration, or other signs of local infection.   Skin prepped in a sterile fashion.   Local anesthesia: Topical Ethyl chloride.   With sterile technique and under real time ultrasound guidance: 40 mg of Kenalog  and 2 mL of Marcaine  injected into subacromial bursa. Fluid seen entering the bursa.   Completed without difficulty   Pain immediately resolved suggesting accurate placement of the medication.   Advised to call if fevers/chills, erythema, induration, drainage, or persistent bleeding.   Images permanently stored and available for review in the ultrasound unit.  Impression: Technically successful ultrasound guided injection.   X-ray images cervical spine and right shoulder obtained today personally and independently interpreted.  Cervical spine: Facet arthritis cervical spine.  No acute fractures or aggressive appearing bony lesions.  Right shoulder: Mild glenohumeral DJD.  No aggressive appearing bony lesions.  Await formal radiology review     Assessment and Plan: 73 y.o. male with right shoulder and neck pain.  Patient has some subacromial impingement and periscapular and trapezius muscle dysfunction.  Plan on subacromial injection and physical therapy referral. Patient had an excellent workup recently as part of his lung cancer treatment and has a chest CT  scan scheduled for the future which should be helpful as well.  His most recent chest x-ray was May 2025.  There is no reason to think that his current pain is anywhere related to his lung cancer history.   PDMP not reviewed this encounter. Orders Placed This Encounter  Procedures   US  LIMITED JOINT SPACE STRUCTURES UP RIGHT(NO LINKED CHARGES)    Reason for Exam (SYMPTOM  OR DIAGNOSIS REQUIRED):   right shoulder  pain    Preferred imaging location?:   [facility] Shoulder Right    Standing Status:   Future    Number of Occurrences:   1    Expiration Date:   03/04/2025    Reason for Exam (SYMPTOM  OR DIAGNOSIS REQUIRED):   right shoulder pain    Preferred imaging location?:   [facility]   DG Cervical Spine Complete    Reason for Exam (SYMPTOM  OR DIAGNOSIS REQUIRED):   neck pain    Preferred imaging location?:    [facility]   Ambulatory referral to Physical Therapy    Referral Priority:   Routine    Referral Type:   Physical Medicine    Referral Reason:   Specialty Services Required    Requested Specialty:   Physical Therapy    Number of Visits Requested:   1   No orders of the defined types were placed in this encounter.    Discussed warning signs or symptoms. Please see discharge instructions. Patient expresses understanding.   The above documentation has been reviewed and is accurate and complete Garlan Juniper, M.D.

## 2024-03-05 ENCOUNTER — Ambulatory Visit: Payer: Self-pay | Admitting: Family Medicine

## 2024-03-05 NOTE — Progress Notes (Signed)
Right shoulder x-ray shows mild arthritis

## 2024-03-06 DIAGNOSIS — G4733 Obstructive sleep apnea (adult) (pediatric): Secondary | ICD-10-CM | POA: Diagnosis not present

## 2024-03-13 DIAGNOSIS — M542 Cervicalgia: Secondary | ICD-10-CM | POA: Diagnosis not present

## 2024-03-13 DIAGNOSIS — R293 Abnormal posture: Secondary | ICD-10-CM | POA: Diagnosis not present

## 2024-03-13 DIAGNOSIS — M25519 Pain in unspecified shoulder: Secondary | ICD-10-CM | POA: Diagnosis not present

## 2024-03-19 NOTE — Progress Notes (Signed)
 Cervical spine x-ray shows some arthritis but it does not look too bad.  The radiologist called it normal for age.

## 2024-03-21 NOTE — Progress Notes (Unsigned)
 @Patient  ID: Nicholas Graham, male    DOB: Mar 08, 1951, 73 y.o.   MRN: [id number]  Chief Complaint  Patient presents with   Consult    Sleep OSA CPAP [employer]. Epworth : 1    Referring provider: Catherine Charlies LABOR, DO  HPI: 73 year old male, former smoker quit in 2003 (90-pack-year history).  Past medical history significant for hypertension, OSA on CPAP.  Patient of Dr. Jude, last seen in office on 11/26/2018.  08/24/2022 Patient presents today for overdue follow-up for OSA.   He has severe OSA HST 05/2018 severe OSA, AHI 57/hour, lowest desaturation 67%  On auto CPAP 10-16cm h20 with large full face mask  He reports compliance with CPAP.   He is having trouble with nasal pillow mask. Continues to have snoring symptoms.  He would like to change masks to resmed airfit n20 nasal cpap mask  Due for new CPAP machine in October 2024 Recommend trying wedge pillow   Airview download 07/25/22-08/23/22 Usage 29/30 days (97%); 18 days (60%) > 4 hours Average usage days used 4 hours 23 min's Pressure 10-15cm h20 (12.1cm h20-95%) Airleaks 33L/min (95%) AHI 1.0       Sleep questionnaire Symptoms-   Wakes up often; Snoring  Prior sleep study- 2019 Bedtime- 8pm Time to fall asleep- 10 mins  Nocturnal awakenings- 2-3 times Out of bed/start of day- 5am Weight changes- down 30 lbs since 2020 Do you operate heavy machinery- no Do you currently wear CPAP- yes Do you current wear oxygen- no Epworth- 2   Imaging: CT chest without contrast 04/2016 showed calcified granulomas and calcified mediastinal lymphadenopathy with a 7 x 8 mm nodule in the left mid lung which had a speck of calcification eccentric.     Assessment & Plan:   OSA on CPAP - Hx severe OSA, AHI 57/hour. Well controlled on auto CPAP. He is 97% compliant with CPAP use over the last 90 days, average usage 4 hours 23 mins. He continues to have snoring symptoms. Current pressure 10-15cm h20 without residual apneas. He would like  to change masks, DME order placed for mask fitting and renew CPAP supplies. Recommend patient try wedge pillow to help with snoring. He has lost 30 lbs since 2020. FU with Dr. Neysa (new patient) in 1 year or sooner if needed.  Almarie LELON Ferrari, NP 08/24/2022 ============================================   12/24/23- 72 yoM former smoker (90 pkyrs, quit 2003) followed for OSA, complicated by HTN, Thrombocytopenia, LULobectomy for AdenoCa 2025,  HST 05/2018 severe OSA, AHI 57/hour, lowest desaturation 67% CPAP auto 10-15       was due for replacement October, 2024 Download compliance- recording ended 10/16/23    AHI 3.6/hr Body weight today-    192 lbs Dr Shelah >LUL resection by Thoracic Surgery 11/27/23. Pending with Dr Mohamed/ Oncology 4/10. Discussed the use of AI scribe software for clinical note transcription with the patient, who gave verbal consent to proceed.  History of Present Illness   The patient, with a history of chest surgery and sleep apnea, presents with a persistent cough producing white sputum. He speculates that the cough may be related to pollen and hopes it will improve as the pollen count decreases. He has been unable to use his CPAP machine due to recent chest surgery but feels ready to resume its use. The patient expresses dissatisfaction with his current CPAP mask, which he finds inconvenient and uncomfortable. He has tried different masks in the past but has not found a suitable one. The  patient's CPAP machine is over 59 years old and he is working with a company called [employer] to replace it.  In addition to these issues, the patient has been experiencing some difficulty with his breathing but denies any wheezing. 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 He expresses concern about the potential impact of his work on his lungs. Wife emphasizes he is not careful about wearing respirator- discussed and  self-protection emphasized.    Assessment and Plan:    Obstructive Sleep Apnea CPAP therapy interrupted post-surgery. Current machine outdated, mask uncomfortable and dislodges easily. - Request replacement of CPAP machine through [employer]. - Request refitting for a new CPAP mask, specifying 'mask of choice'. - Advise him to contact the home care company if no response is received regarding the machine replacement.  Chronic Cough Persistent cough with white sputum, likely exacerbated by seasonal pollen.  Post-Surgical Recovery Recovering from chest surgery, considering return to work with potential respiratory strain. - Advise him to avoid strenuous activities and heavy lifting during recovery. - Encourage use of protective masks if returning to work involving exposure to respiratory irritants.  Lung Cancer Follow-up Under Dr. Jeannett care for lung cancer, scheduled for follow-up tests. - Attend follow-up appointment with Dr. Sherrod for x-rays and blood work.   03/24/24-  72 yoM former smoker (90 pkyrs, quit 2003) followed for OSA, complicated by HTN, Thrombocytopenia, LULobectomy for AdenoCa 2025,  HST 05/2018 severe OSA, AHI 57/hour, lowest desaturation 67% CPAP auto 10-15       was due for replacement October, 2024 Download compliance- 47%, AHI 1.2/hr   mostly short nights Body weight today-    186 lbs Dr Shelah >LUL resection by Thoracic Surgery 11/27/23. Dr Mohamed/ Oncology 4/10. CXR 01/17/24 IMPRESSION: Blunting of the left costophrenic angle, which may represent postsurgical pleural thickening or a left pleural effusion. Discussed the use of AI scribe software for clinical note transcription with the patient, who gave verbal consent to proceed.  History of Present Illness   Nicholas Graham is a 74 year old male with a history of lung cancer surgery and OSA who presents for follow-up regarding post-surgical chest pain and CPAP machine issues.  He experiences intermittent chest  pain following lung cancer surgery, which varies in nature from sharp to spasm-like. Despite the discomfort, he continues to walk regularly, although he has reduced his distance from four miles to one and a half to two miles, depending on the weather.  He has issues with his CPAP machine due to mask leaks, particularly because of his narrow nose and beard. Despite being refitted with a new machine and mask on April 14, 2024, the problem persists. He has tried different types of masks, including full face masks, but finds them uncomfortable. He is concerned about not reaching the goal of using the CPAP for at least four hours per night, as indicated by the machine's black bar indicator.     Assessment and Plan:    Obstructive sleep apnea CPAP mask leakage affecting sleep quality. Recent refitting and new machine obtained. Difficulty achieving AIM-4 goal of four hours per night. - Encouraged contact with home care company for further adjustments to improve comfort and compliance. - Ordered refit for CPAP mask with home care company.  Post-thoracotomy pain syndrome Intermittent sharp chest pain due to nerve irritation and scarring from previous lung cancer surgery. Pain expected to persist but should eventually fade.  Lung cancer No concerns on recent chest x-ray. - Follow-up imaging and management by Dr. Deatrice.     ROS-see HPI   + = positive Constitutional:    weight loss, night sweats, fevers, chills, fatigue, lassitude. HEENT:    headaches, difficulty swallowing, tooth/dental problems, sore throat,       sneezing, itching, ear ache, nasal congestion, post nasal drip, snoring CV:    chest pain, orthopnea, PND, swelling in lower extremities, anasarca, dizziness, palpitations Resp:   shortness of breath with exertion or at rest.               + productive cough,   non-productive cough, coughing up of blood.              change in color of mucus.  wheezing.   Skin:    rash or lesions. GI:   No-   heartburn, indigestion, + abdominal pain, nausea, vomiting, diarrhea,                + change in bowel habits, loss of appetite GU: dysuria, change in color of urine, no urgency or frequency.   flank pain. MS:   joint pain, stiffness, decreased range of motion, back pain. Neuro-     nothing unusual Psych:  change in mood or affect.  depression or anxiety.   memory loss.  OBJ- Physical Exam General- Alert, Oriented, Affect-appropriate, Distress- none acute Skin- rash-none, lesions- none, excoriation- none Lymphadenopathy- none Head- atraumatic            Eyes- Gross vision intact, PERRLA, conjunctivae and secretions clear            Ears- Hearing, canals-normal            Nose- Clear, no-Septal dev, mucus, polyps, erosion, perforation             Throat- Mallampati III , mucosa clear , drainage- none, tonsils- atrophic Neck- flexible , trachea midline, no stridor , thyroid  nl, carotid no bruit Chest - symmetrical excursion , unlabored           Heart/CV- RRR , no murmur , no gallop  , no rub, nl s1 s2                           - JVD- none , edema- none, stasis changes- none, varices- none           Lung- clear to P&A/ +diminished on L, wheeze- none, cough- none , dullness-none, rub- none           Chest wall-  Abd-  Br/ Gen/ Rectal- Not done, not indicated Extrem- cyanosis- none, clubbing, none, atrophy- none, strength- nl Neuro- grossly intact to observation

## 2024-03-24 ENCOUNTER — Encounter: Payer: Self-pay | Admitting: Internal Medicine

## 2024-03-24 ENCOUNTER — Ambulatory Visit: Admitting: Internal Medicine

## 2024-03-24 VITALS — BP 104/66 | HR 63 | Temp 97.6°F | Resp 18 | Ht 74.0 in | Wt 186.0 lb

## 2024-03-24 DIAGNOSIS — G8922 Chronic post-thoracotomy pain: Secondary | ICD-10-CM | POA: Diagnosis not present

## 2024-03-24 DIAGNOSIS — G4733 Obstructive sleep apnea (adult) (pediatric): Secondary | ICD-10-CM

## 2024-03-24 DIAGNOSIS — Z85118 Personal history of other malignant neoplasm of bronchus and lung: Secondary | ICD-10-CM

## 2024-03-24 DIAGNOSIS — Z87891 Personal history of nicotine dependence: Secondary | ICD-10-CM

## 2024-03-24 NOTE — Patient Instructions (Signed)
 We can continue CPAP auto 10-15. You can call and got back to your home care company to keep working on mask fit as needed.  Let us  know if you have sleep or breathing problems before we see you next.

## 2024-04-02 ENCOUNTER — Ambulatory Visit: Admitting: Family Medicine

## 2024-04-02 ENCOUNTER — Other Ambulatory Visit: Payer: Self-pay

## 2024-04-02 VITALS — BP 90/60 | HR 70 | Ht 74.0 in | Wt 187.0 lb

## 2024-04-02 DIAGNOSIS — M542 Cervicalgia: Secondary | ICD-10-CM | POA: Diagnosis not present

## 2024-04-02 DIAGNOSIS — M25511 Pain in right shoulder: Secondary | ICD-10-CM | POA: Diagnosis not present

## 2024-04-02 DIAGNOSIS — G8929 Other chronic pain: Secondary | ICD-10-CM | POA: Diagnosis not present

## 2024-04-02 NOTE — Progress Notes (Signed)
 I, Claretha Schimke am a scribe for Dr. Artist Lloyd, MD.  Nicholas Graham is a 73 y.o. male who presents to [facility] today for cont'd R shoulder pain. Pt was last seen by Dr. Lloyd on 03/04/24 and was given a R subacromial steroid injection and was referred to [facility].  Today, pt reports went to [facility] PT and they wanted to know what kind of cancer he had before they would do dry needling on him. Patient states that he is not doing the exercises because it hurts. Pain is a burning sensation now and it is running down his neck.   Dx imaging: 03/04/24 R shoulder & c-spine XR 07/25/23 R shoulder & R rib/chest XR             05/01/22 R shoulder MRI             03/13/22 C-spine MRI             10/26/21 R shoulder XR  Pertinent review of systems: No fevers or chills  Relevant historical information: History of lung cancer status post partial lung lobectomy about 4 months ago.  He feels great from that perspective.   Exam:  BP 90/60   Pulse 70   Ht 6' 2 (1.88 m)   Wt 187 lb (84.8 kg)   SpO2 98%   BMI 24.01 kg/m  General: Well Developed, well nourished, and in no acute distress.   MSK: Right shoulder normal-appearing normal motion pain with abduction.   C-spine: Normal appearing. Reduced cervical motion.  Tender palpation right trapezius.    Lab and Radiology Results  Procedure: Real-time Ultrasound Guided Injection of right shoulder glenohumeral joint posterior approach Device: Philips Affiniti 50G/GE Logiq Images permanently stored and available for review in PACS Verbal informed consent obtained.  Discussed risks and benefits of procedure. Warned about infection, bleeding, hyperglycemia damage to structures among others. Patient expresses understanding and agreement Time-out conducted.   Noted no overlying erythema, induration, or other signs of local infection.   Skin prepped in a sterile fashion.   Local anesthesia: Topical Ethyl  chloride.   With sterile technique and under real time ultrasound guidance: 40 mg of Kenalog  and 2 mL of Marcaine  injected into glenohumeral joint. Fluid seen entering the joint capsule.   Completed without difficulty   Pain immediately resolved suggesting accurate placement of the medication.   Advised to call if fevers/chills, erythema, induration, drainage, or persistent bleeding.   Images permanently stored and available for review in the ultrasound unit.  Impression: Technically successful ultrasound guided injection.   CLINICAL DATA:  73 year old male with neck pain, right upper extremity pain for 2-3 weeks. No known injury.  EXAM: CERVICAL SPINE - COMPLETE 4+ VIEW  COMPARISON:  Cervical spine radiographs 01/16/2021.  FINDINGS: Five views. Maintained cervical lordosis. Normal prevertebral soft ...   Study Result  Narrative & Impression  CLINICAL DATA:  Neck and right shoulder pain for 2-3 weeks. No known injury.   EXAM: RIGHT SHOULDER - 2+ VIEW   COMPARISON:  Right shoulder radiographs 07/25/2023   FINDINGS: There is again diffuse decreased bone mineralization. Mild glenohumeral joint space narrowing. Minimal peripheral acromioclavicular degenerative osteophytes. No acute fracture or dislocation. The visualized portion of the right lung is unremarkable. The   IMPRESSION: Mild glenohumeral and minimal acromioclavicular osteoarthritis.     Electronically Signed   By: Tanda Lyons M.D.   On: 03/04/2024 14:49    I, Nicholas Sobh  Graham, personally (independently) visualized and performed the interpretation of the images attached in this note.      Assessment and Plan: 73 y.o. male with right shoulder and neck pain.  A lot of the trapezius and neck pain is muscle spasm and dysfunction related to his shoulder dysfunction.  He has had a visit or 2 with physical therapy but is generally not very happy with the exercises.  He is very much interested in dry needling which I  think could be quite helpful.  He should be able to get dry needling through a chiropractor office [facility] are cheaper than he is with physical therapy and I think would be equally helpful.  He does have some glenohumeral DJD on x-ray.  He did not have much benefit from subacromial injection previously about a month ago.  Plan for glenohumeral injection today.  If not improved consider MRI C-spine and shoulder.   PDMP not reviewed this encounter. Orders Placed This Encounter  Procedures   US  LIMITED JOINT SPACE STRUCTURES UP RIGHT(NO LINKED CHARGES)    Reason for Exam (SYMPTOM  OR DIAGNOSIS REQUIRED):   right shoulder pain    Preferred imaging location?:   [facility]   No orders of the defined types were placed in this encounter.    Discussed warning signs or symptoms. Please see discharge instructions. Patient expresses understanding.   The above documentation has been reviewed and is accurate and complete Artist Graham, M.D.

## 2024-04-02 NOTE — Patient Instructions (Signed)
 Thank you for coming in today.   You received an injection today. Seek immediate medical attention if the joint becomes red, extremely painful, or is oozing fluid.

## 2024-04-05 DIAGNOSIS — G4733 Obstructive sleep apnea (adult) (pediatric): Secondary | ICD-10-CM | POA: Diagnosis not present

## 2024-04-13 DIAGNOSIS — M542 Cervicalgia: Secondary | ICD-10-CM | POA: Diagnosis not present

## 2024-04-13 DIAGNOSIS — M25519 Pain in unspecified shoulder: Secondary | ICD-10-CM | POA: Diagnosis not present

## 2024-04-13 DIAGNOSIS — R293 Abnormal posture: Secondary | ICD-10-CM | POA: Diagnosis not present

## 2024-04-27 DIAGNOSIS — M25519 Pain in unspecified shoulder: Secondary | ICD-10-CM | POA: Diagnosis not present

## 2024-04-27 DIAGNOSIS — R293 Abnormal posture: Secondary | ICD-10-CM | POA: Diagnosis not present

## 2024-04-27 DIAGNOSIS — M542 Cervicalgia: Secondary | ICD-10-CM | POA: Diagnosis not present

## 2024-04-30 ENCOUNTER — Other Ambulatory Visit: Payer: Self-pay | Admitting: Family Medicine

## 2024-04-30 ENCOUNTER — Encounter: Payer: Self-pay | Admitting: Family Medicine

## 2024-04-30 ENCOUNTER — Ambulatory Visit (INDEPENDENT_AMBULATORY_CARE_PROVIDER_SITE_OTHER): Admitting: Family Medicine

## 2024-04-30 DIAGNOSIS — I10 Essential (primary) hypertension: Secondary | ICD-10-CM

## 2024-04-30 DIAGNOSIS — K259 Gastric ulcer, unspecified as acute or chronic, without hemorrhage or perforation: Secondary | ICD-10-CM | POA: Insufficient documentation

## 2024-04-30 DIAGNOSIS — M792 Neuralgia and neuritis, unspecified: Secondary | ICD-10-CM | POA: Insufficient documentation

## 2024-04-30 DIAGNOSIS — E785 Hyperlipidemia, unspecified: Secondary | ICD-10-CM

## 2024-04-30 DIAGNOSIS — M5412 Radiculopathy, cervical region: Secondary | ICD-10-CM | POA: Insufficient documentation

## 2024-04-30 DIAGNOSIS — Z9889 Other specified postprocedural states: Secondary | ICD-10-CM | POA: Diagnosis not present

## 2024-04-30 DIAGNOSIS — G894 Chronic pain syndrome: Secondary | ICD-10-CM | POA: Insufficient documentation

## 2024-04-30 MED ORDER — OXYCODONE-ACETAMINOPHEN 10-325 MG PO TABS: 1.0000 | ORAL_TABLET | Freq: Three times a day (TID) | ORAL | 0 refills | Status: DC | PRN

## 2024-04-30 MED ORDER — ESCITALOPRAM OXALATE 20 MG PO TABS: 20.0000 mg | ORAL_TABLET | Freq: Every day | ORAL | 1 refills | Status: DC

## 2024-04-30 MED ORDER — BACLOFEN 20 MG PO TABS: 10.0000 mg | ORAL_TABLET | Freq: Three times a day (TID) | ORAL | 1 refills | Status: DC

## 2024-04-30 MED ORDER — AMLODIPINE BESYLATE 10 MG PO TABS: 10.0000 mg | ORAL_TABLET | Freq: Every day | ORAL | 1 refills | Status: DC

## 2024-04-30 MED ORDER — HYDROCHLOROTHIAZIDE 25 MG PO TABS: 25.0000 mg | ORAL_TABLET | Freq: Every day | ORAL | 1 refills | Status: DC

## 2024-04-30 MED ORDER — LISINOPRIL 10 MG PO TABS: 10.0000 mg | ORAL_TABLET | Freq: Every day | ORAL | 1 refills | Status: DC

## 2024-04-30 MED ORDER — PANTOPRAZOLE SODIUM 40 MG PO TBEC: 40.0000 mg | DELAYED_RELEASE_TABLET | Freq: Every day | ORAL | 1 refills | Status: DC

## 2024-04-30 MED ORDER — OXYCODONE-ACETAMINOPHEN 10-325 MG PO TABS
1.0000 | ORAL_TABLET | Freq: Three times a day (TID) | ORAL | 0 refills | Status: DC | PRN
Start: 2024-05-23 — End: 2024-07-15

## 2024-04-30 MED ORDER — GABAPENTIN 600 MG PO TABS: 600.0000 mg | ORAL_TABLET | Freq: Three times a day (TID) | ORAL | 1 refills | Status: DC

## 2024-04-30 NOTE — Patient Instructions (Signed)

## 2024-04-30 NOTE — Progress Notes (Signed)
 Patient ID: Nicholas Graham, male  DOB: [birth year], 73 y.o.   MRN: [id number] Patient Care Team    Relationship Specialty Notifications Start End  Catherine Charlies LABOR, DO PCP - General Family Medicine  10/31/15   Barbarann Oneil BROCKS, MD (Inactive) Consulting Physician Orthopedic Surgery  10/26/21   Armbruster, Elspeth SQUIBB, MD Consulting Physician Gastroenterology  05/29/23   Elisabeth Valli BIRCH, MD Consulting Physician Urology  05/29/23   Neysa Reggy BIRCH, MD Consulting Physician Pulmonary Disease  10/08/23   Shyrl Linnie KIDD, MD Consulting Physician Cardiothoracic Surgery  11/21/23   Joane Artist RAMAN, MD Consulting Physician Sports Medicine  11/21/23     Chief Complaint  Patient presents with   Hypertension    Subjective:  Nicholas Graham is a 73 y.o. male present for Chronic Conditions/illness Management  All past medical history, surgical history, allergies, family history, immunizations, medications and social history were updated in the electronic medical record today. All recent labs, ED visits and hospitalizations within the last year were reviewed.   Cervical neck pain with evidence of disc disease/Chronic midline thoracic back pain/Thoracic degenerative disc disease/DDD (degenerative disc disease), lumbar/History of lumbar discectomy Pt presents for his chronic pain management treatment for his cervical and lumbar conditions. His discomfort has progressed since 2020-current regimen provides enough relief to allow him to increase quality of life and remain active.  He has been evaluated  by neuro and pain rehab. He has received injections- that have only provided a few days of temporary relief in the past . He is frustrated, but has appropriate expectations that he will be living with some pain.  He reports he is maintaining a quality of life with the use of opiates, that allows him to remain active with Percocet 10-325 (1 tab every 8-12 hours), baclofen  and gabapentin . 600/600/600 (had headaches  at increased doses).  Indication for chronic opioid: Cervical spine degeneration, chronic neck and back pain. Medication and dose: Oxycodone -acetaminophen  10-325 1 tab TID prn daily # pills per month: 60 Last UDS date:UYD 08/2023 Pain contract signed (Y/N): Yes- UTD-08/2023 Date narcotic database last reviewed (/include red flags): 04/30/24  03/08/2021  had epidural injection of cervical spine and he feels it is helpful.  11/2021-has had repeat injections that have not been as helpful. 08/2023: Dr, Elnor- performed injections.   Original note: Lower cervical to mid thoracic pain still present. Using his right arm exacerbates his pain midline of thoracic.  He has a history thoracic spine degeneration and discectomy in his lumbar spine.  Has degenerative changes, bone spurring and facet arthropathy in his lumbar spine by x-rays obtained 11/2018. Cervical spine x-ray with mild bilateral foraminal narrowing at C3-C4 11/2018. Unfortunately he was reported as allergic or intolerant to many of the medications that would be helpful for him such as gabapentin , Lyrica, Celebrex and Cymbalta --> we discussed this in more detail last visit and he believes that the allergy-hallucinations  was secondary to having multiple of these medicines on board at the same time back in 2014. We tried to start gabapentin  and it did not have any side effects from medication. It has not been extremely helpful  Yet at lower doses. He admits to rather sigmnificant stomach upset starting from the naproxen  BID use ober the last 8 week. Advanced imaging has not bee able to be completed 2/2 to covid outbreak. He desired to wait on referral to specialist last visit in hope that he would receive benefit from the gabapentin .  Hypertension/HLD/overweight: Pt reports compliance with amlodipine  10 mg daily, lisinopril  10 mg QD and HCTZ.  Patient denies chest pain, shortness of breath, dizziness or lower extremity edema.    Pt takes a  daily baby ASA. Pt is  prescribed statin.  Labs UTD Diet: Low-sodium Exercise: Not exercising as routinely. RF: Hypertension, hyperlipidemia, family history of heart disease. Overweight.   ANXIETY/sleep disorder:  Patient reports he is feeling well on Lexapro  20 mg daily      11/06/2023   11:23 AM 10/08/2023    8:19 AM 06/19/2023    9:02 AM 01/18/2023   10:26 AM 11/13/2022    8:16 AM  Depression screen PHQ 2/9  Decreased Interest 0 0 0 0 0  Down, Depressed, Hopeless 0 0 0 0 0  PHQ - 2 Score 0 0 0 0 0      11/30/2021    8:32 AM 01/14/2020    8:20 AM 12/16/2019    8:06 AM 06/23/2019    8:30 AM  GAD 7 : Generalized Anxiety Score  Nervous, Anxious, on Edge 0 0 0 0  Control/stop worrying 0 0 0 0  Worry too much - different things 0 0 0 0  Trouble relaxing 0 3 0 0  Restless 0 3 3 1   Easily annoyed or irritable 0 1 0 0  Afraid - awful might happen 0 0 0 0  Total GAD 7 Score 0 7 3 1   Anxiety Difficulty  Not difficult at all Somewhat difficult Not difficult at all            02/06/2024    7:57 AM 11/04/2023   10:37 AM 10/10/2023    4:08 PM 10/08/2023    8:19 AM 06/19/2023    9:02 AM  Fall Risk   Falls in the past year? 0 0 0 0 0  Number falls in past yr:    0 0  Injury with Fall?    0 0  Risk for fall due to :    No Fall Risks No Fall Risks  Follow up Falls evaluation completed   Falls evaluation completed Falls evaluation completed       There is no immunization history on file for this patient.   Past Medical History:  Diagnosis Date   Arm fracture    left. Dx'd w/RSD post fracture   Arthritis    Carpal tunnel syndrome    Chest pain 2016   [facility] (Dr. Johnnye) doing stress echo, but feels like musculoskeletal chest wall pain is cause of his discomfort   Chronic back pain    COVID-19 06/2020   Deviated septum    Diverticulosis    GERD (gastroesophageal reflux disease)    Hyperlipidemia    Hypertension    Joint pain    Muscle pain    OSA on CPAP  07/04/2018   - Trial of CPAP therapy on 17 cm H2O with a Large size [employer]kel Full Face Mask Simplus mask and heated humidification.   Plantar fasciitis    Pulmonary nodule 04/2016   [place].  Pt chose to repeat CT chest w/out contrast in 3 mo---as per radiologist's recommendations.   Right inguinal hernia    fat-containing.  Small hydrocele in left hemiscrotum.   Sleep apnea    Snoring    Swelling    of legs/feet/hands   Tobacco dependence in remission    Allergies  Allergen Reactions   Penicillins Hives   Celebrex [Celecoxib] Other (See Comments)  unknown reaction   Cymbalta [Duloxetine Hcl] Other (See Comments)    unknown   Past Surgical History:  Procedure Laterality Date   BRONCHIAL BIOPSY  10/28/2023   Procedure: BRONCHIAL BIOPSIES;  Surgeon: Shelah Lamar RAMAN, MD;  Location: [facility] ENDOSCOPY;  Service: Pulmonary;;   BRONCHIAL BRUSHINGS  10/28/2023   Procedure: BRONCHIAL BRUSHINGS;  Surgeon: Shelah Lamar RAMAN, MD;  Location: [facility] ENDOSCOPY;  Service: Pulmonary;;   BRONCHIAL NEEDLE ASPIRATION BIOPSY  10/28/2023   Procedure: BRONCHIAL NEEDLE ASPIRATION BIOPSIES;  Surgeon: Shelah Lamar RAMAN, MD;  Location: [facility] ENDOSCOPY;  Service: Pulmonary;;   BRONCHIAL WASHINGS  10/28/2023   Procedure: BRONCHIAL WASHINGS;  Surgeon: Shelah Lamar RAMAN, MD;  Location: [facility] ENDOSCOPY;  Service: Pulmonary;;   COLONOSCOPY     fracture repair left arm     had a fixator   INGUINAL HERNIA REPAIR Left 2013   repeat hernia repair. x2   INTERCOSTAL NERVE BLOCK  11/27/2023   Procedure: BLOCK, NERVE, INTERCOSTAL;  Surgeon: Shyrl Linnie KIDD, MD;  Location: [facility] OR;  Service: Thoracic;;   IR RADIOLOGIST EVAL & MGMT  07/22/2023   KNEE ARTHROSCOPY Right    LUMBAR DISC SURGERY  1993   LYMPH NODE BIOPSY  11/27/2023   Procedure: LYMPH NODE BIOPSY;  Surgeon: Shyrl Linnie KIDD, MD;  Location: [facility] OR;  Service: Thoracic;;   NEURECTOMY inguinal after hernia repair Left    PLANTAR FASCIA SURGERY Right 2000s   SHOULDER  ARTHROSCOPY Right    UPPER GI ENDOSCOPY     Family History  Problem Relation Age of Onset   Lung cancer Mother    Lung cancer Father    Bone cancer Brother        bone   Heart disease Maternal Uncle    Colon cancer Neg Hx    Esophageal cancer Neg Hx    Stomach cancer Neg Hx    Social History   Social History Narrative   Married, [place]. Children (2) Adult, 4 grandchildren.    9 th grade, Retired.   Wears seatbelt   Smoke detector in the home.    Firearms locked in the home.    Feels safe in his relationships.     Allergies as of 04/30/2024       Reactions   Penicillins Hives   Celebrex [celecoxib] Other (See Comments)   unknown reaction   Cymbalta [duloxetine Hcl] Other (See Comments)   unknown        Medication List        Accurate as of April 30, 2024 12:20 PM. If you have any questions, ask your nurse or doctor.          STOP taking these medications    Nucynta 100 MG Tabs Generic drug: Tapentadol HCl Stopped by: Charlies Bellini   Relafen 750 MG tablet Generic drug: nabumetone Stopped by: Charlies Bellini       TAKE these medications    amLODipine  10 MG tablet Commonly known as: NORVASC  Take 1 tablet (10 mg total) by mouth daily.   aspirin  81 MG tablet Take 81 mg by mouth daily.   baclofen  20 MG tablet Commonly known as: LIORESAL  Take 0.5-1 tablets (10-20 mg total) by mouth 3 (three) times daily.   escitalopram  20 MG tablet Commonly known as: LEXAPRO  Take 1 tablet (20 mg total) by mouth daily.   gabapentin  600 MG tablet Commonly known as: NEURONTIN  Take 1 tablet (600 mg total) by mouth 3 (three) times daily.   hydrochlorothiazide  25 MG tablet Commonly  known as: HYDRODIURIL  Take 1 tablet (25 mg total) by mouth daily.   lisinopril  10 MG tablet Commonly known as: ZESTRIL  Take 1 tablet (10 mg total) by mouth daily.   MegaRed Omega-3 Krill Oil 500 MG Caps Take 2 capsules by mouth daily.   Myrbetriq  50 MG Tb24 tablet Generic drug:  mirabegron  ER Take 50 mg by mouth daily.   oxyCODONE -acetaminophen  10-325 MG tablet Commonly known as: PERCOCET Take 1 tablet by mouth every 8 (eight) hours as needed for pain. What changed: Another medication with the same name was changed. Make sure you understand how and when to take each. Changed by: Braedan Meuth   oxyCODONE -acetaminophen  10-325 MG tablet Commonly known as: PERCOCET Take 1 tablet by mouth every 8 (eight) hours as needed for pain. Start taking on: May 23, 2024 What changed: These instructions start on May 23, 2024. If you are unsure what to do until then, ask your doctor or other care provider. Changed by: Landyn Buckalew   oxyCODONE -acetaminophen  10-325 MG tablet Commonly known as: PERCOCET Take 1 tablet by mouth every 8 (eight) hours as needed for pain. Start taking on: June 13, 2024 What changed: These instructions start on June 13, 2024. If you are unsure what to do until then, ask your doctor or other care provider. Changed by: Charlies Bellini   pantoprazole  40 MG tablet Commonly known as: PROTONIX  Take 1 tablet (40 mg total) by mouth daily.   simvastatin  10 MG tablet Commonly known as: ZOCOR  Take 1 tablet (10 mg total) by mouth every other day.       All past medical history, surgical history, allergies, family history, immunizations andmedications were updated in the EMR today and reviewed under the history and medication portions of their EMR.      ROS 14 pt review of systems performed and negative (unless mentioned in an HPI)  Objective: BP 116/72   Pulse 74   Temp 98 F (36.7 C)   Wt 182 lb (82.6 kg)   SpO2 98%   BMI 23.37 kg/m  Physical Exam Vitals and nursing note reviewed.  Constitutional:      General: He is not in acute distress.    Appearance: Normal appearance. He is not ill-appearing, toxic-appearing or diaphoretic.  HENT:     Head: Normocephalic and atraumatic.  Eyes:     General: No scleral icterus.        Right eye: No discharge.        Left eye: No discharge.     Extraocular Movements: Extraocular movements intact.     Pupils: Pupils are equal, round, and reactive to light.  Cardiovascular:     Rate and Rhythm: Normal rate and regular rhythm.     Heart sounds: No murmur heard. Pulmonary:     Effort: Pulmonary effort is normal. No respiratory distress.     Breath sounds: Normal breath sounds. No wheezing, rhonchi or rales.  Musculoskeletal:     Right lower leg: No edema.     Left lower leg: No edema.  Skin:    General: Skin is warm.     Findings: No rash.  Neurological:     Mental Status: He is alert and oriented to person, place, and time. Mental status is at baseline.  Psychiatric:        Mood and Affect: Mood normal.        Behavior: Behavior normal.        Thought Content: Thought content normal.  Judgment: Judgment normal.     No results found.  Assessment/plan: Nicholas Graham is a 73 y.o. male present for chronic condition management Cervical neck pain with evidence of disc disease/Chronic midline thoracic back pain/Thoracic degenerative disc disease/DDD (degenerative disc disease), lumbar/History of lumbar discectomy He is following with sports medicine, physical therapy and orthopedics.  - current pain management is helping him maintain activity and improve his quality of life. -[place]  controlled substance database reviewed and appropriate 04/30/24 - Pain contract signed > UTD - UDS > UTD NSAIDs contraindicated with gastritis Continue gabapentin   600/600/600> did not tolerate higher dose (headache) Continue baclofen  10-20 mg 3 times daily Continue percocet 10-325 mg tID #90 prescribed with 2 refills (held) at pharmacy.  - f/u 3 mos   Essential hypertension, benign/dyslipidemia/overweight Stable Continue amlodipine  10 mg daily  Continue HCTZ. Continue lisinopril  10 mg daily Continue Zocor  10 mg q. OD - continue fish oil. - continue ASA 81 mg -  routine diet and exercise. Labs up-to-date 06-2023   Gastroesophageal reflux disease with esophagitis Continue Protonix  twice daily, Pepcid  twice daily Avoid all NSAIDs.  Caution on spicy foods and alcohol use.   Anxiety/sleep disturbance:  Stable Continue lexapro  20 mg qd.   OSA on CPAP Compliant  Malignant neoplasm of upper lobe of left lung: S/p partial long left lobe lobectomy Follow w/up onc q 6 mos.   Return in about 11 weeks (around 07/16/2024).   No orders of the defined types were placed in this encounter.  Meds ordered this encounter  Medications   amLODipine  (NORVASC ) 10 MG tablet    Sig: Take 1 tablet (10 mg total) by mouth daily.    Dispense:  90 tablet    Refill:  1   baclofen  (LIORESAL ) 20 MG tablet    Sig: Take 0.5-1 tablets (10-20 mg total) by mouth 3 (three) times daily.    Dispense:  270 tablet    Refill:  1   escitalopram  (LEXAPRO ) 20 MG tablet    Sig: Take 1 tablet (20 mg total) by mouth daily.    Dispense:  90 tablet    Refill:  1   gabapentin  (NEURONTIN ) 600 MG tablet    Sig: Take 1 tablet (600 mg total) by mouth 3 (three) times daily.    Dispense:  270 tablet    Refill:  1   hydrochlorothiazide  (HYDRODIURIL ) 25 MG tablet    Sig: Take 1 tablet (25 mg total) by mouth daily.    Dispense:  90 tablet    Refill:  1   lisinopril  (ZESTRIL ) 10 MG tablet    Sig: Take 1 tablet (10 mg total) by mouth daily.    Dispense:  90 tablet    Refill:  1   pantoprazole  (PROTONIX ) 40 MG tablet    Sig: Take 1 tablet (40 mg total) by mouth daily.    Dispense:  90 tablet    Refill:  1   oxyCODONE -acetaminophen  (PERCOCET) 10-325 MG tablet    Sig: Take 1 tablet by mouth every 8 (eight) hours as needed for pain.    Dispense:  90 tablet    Refill:  0   oxyCODONE -acetaminophen  (PERCOCET) 10-325 MG tablet    Sig: Take 1 tablet by mouth every 8 (eight) hours as needed for pain.    Dispense:  90 tablet    Refill:  0   oxyCODONE -acetaminophen  (PERCOCET) 10-325 MG  tablet    Sig: Take 1 tablet by mouth every 8 (eight) hours as needed  for pain.    Dispense:  90 tablet    Refill:  0   Referral Orders  No referral(s) requested today     Note is dictated utilizing voice recognition software. Although note has been proof read prior to signing, occasional typographical errors still can be missed. If any questions arise, please do not hesitate to call for verification.  Electronically signed by: Charlies Bellini, DO [facility] [place]

## 2024-05-01 DIAGNOSIS — M542 Cervicalgia: Secondary | ICD-10-CM | POA: Diagnosis not present

## 2024-05-01 DIAGNOSIS — R293 Abnormal posture: Secondary | ICD-10-CM | POA: Diagnosis not present

## 2024-05-01 DIAGNOSIS — M25519 Pain in unspecified shoulder: Secondary | ICD-10-CM | POA: Diagnosis not present

## 2024-05-05 DIAGNOSIS — M542 Cervicalgia: Secondary | ICD-10-CM | POA: Diagnosis not present

## 2024-05-05 DIAGNOSIS — R293 Abnormal posture: Secondary | ICD-10-CM | POA: Diagnosis not present

## 2024-05-05 DIAGNOSIS — M25519 Pain in unspecified shoulder: Secondary | ICD-10-CM | POA: Diagnosis not present

## 2024-05-06 DIAGNOSIS — G4733 Obstructive sleep apnea (adult) (pediatric): Secondary | ICD-10-CM | POA: Diagnosis not present

## 2024-05-11 DIAGNOSIS — M25519 Pain in unspecified shoulder: Secondary | ICD-10-CM | POA: Diagnosis not present

## 2024-05-11 DIAGNOSIS — M542 Cervicalgia: Secondary | ICD-10-CM | POA: Diagnosis not present

## 2024-05-11 DIAGNOSIS — R293 Abnormal posture: Secondary | ICD-10-CM | POA: Diagnosis not present

## 2024-05-14 DIAGNOSIS — M542 Cervicalgia: Secondary | ICD-10-CM | POA: Diagnosis not present

## 2024-05-14 DIAGNOSIS — R293 Abnormal posture: Secondary | ICD-10-CM | POA: Diagnosis not present

## 2024-05-14 DIAGNOSIS — M25519 Pain in unspecified shoulder: Secondary | ICD-10-CM | POA: Diagnosis not present

## 2024-05-25 DIAGNOSIS — M25519 Pain in unspecified shoulder: Secondary | ICD-10-CM | POA: Diagnosis not present

## 2024-05-25 DIAGNOSIS — M542 Cervicalgia: Secondary | ICD-10-CM | POA: Diagnosis not present

## 2024-05-25 DIAGNOSIS — R293 Abnormal posture: Secondary | ICD-10-CM | POA: Diagnosis not present

## 2024-06-03 NOTE — Progress Notes (Unsigned)
 LILLETTE Ileana Collet, PhD, LAT, ATC acting as a scribe for Artist Lloyd, MD.  Artur L Gallardo is a 73 y.o. male who presents to [facility] today for cont'd R shoulder and neck pain. Pt was last seen by Dr. Lloyd on 04/02/24 and was given a R GH steroid injection and was advised to consider dry needling w/ a chiropractor.  Today, pt reports he went to PT and tried the dry needling. He c/o burning through the periscapular region. Pain was exacerbation after going on a trip and doing a lot of driving.   Dx imaging: 03/04/24 R shoulder & c-spine XR 07/25/23 R shoulder & R rib/chest XR             05/01/22 R shoulder MRI             03/13/22 C-spine MRI             10/26/21 R shoulder XR  Pertinent review of systems: No fevers or chills  Relevant historical information: History lung cancer s/p resection stage Ib   Exam:  BP 118/72   Pulse 72   Ht 6' 2 (1.88 m)   Wt 187 lb (84.8 kg)   SpO2 99%   BMI 24.01 kg/m  General: Well Developed, well nourished, and in no acute distress.   MSK: Right shoulder tender palpation at rhomboid insertion onto superior medial corner of scapula.  Normal shoulder motion    Lab and Radiology Results  Procedure: Real-time Ultrasound Guided Injection of trigger point right trapezius/rhomboid Device: Philips Affiniti 50G/GE Logiq Images permanently stored and available for review in PACS Verbal informed consent obtained.  Discussed risks and benefits of procedure. Warned about infection, bleeding, hyperglycemia damage to structures, pneumothorax among others. Patient expresses understanding and agreement Time-out conducted.   Noted no overlying erythema, induration, or other signs of local infection.   Skin prepped in a sterile fashion.   Local anesthesia: Topical Ethyl chloride.   With sterile technique and under real time ultrasound guidance: 40 mg of Kenalog  and 2 mL's of Marcaine  injected into trigger point rhomboid muscle  superior medial corner scapula. Fluid seen entering the trigger point.   Completed without difficulty   Pain immediately resolved suggesting accurate placement of the medication.   Advised to call if fevers/chills, erythema, induration, drainage, or persistent bleeding.   Images permanently stored and available for review in the ultrasound unit.  Impression: Technically successful ultrasound guided injection.        Assessment and Plan: 73 y.o. male with right trapezius pain and shoulder pain.  Patient has already had a great trial of physical therapy.  He has had some temporary relief with dry needling but it does not last.  Plan for trigger point injection today and if not sufficiently beneficial next step would be MRI C-spine and shoulder which we should be able to order with a phone call or MyChart message.   PDMP not reviewed this encounter. Orders Placed This Encounter  Procedures   US  LIMITED JOINT SPACE STRUCTURES UP RIGHT(NO LINKED CHARGES)    Reason for Exam (SYMPTOM  OR DIAGNOSIS REQUIRED):   right shoulder pain    Preferred imaging location?:   [facility]   No orders of the defined types were placed in this encounter.    Discussed warning signs or symptoms. Please see discharge instructions. Patient expresses understanding.   The above documentation has been reviewed and is accurate and complete Artist Lloyd,  M.D.

## 2024-06-04 ENCOUNTER — Other Ambulatory Visit: Payer: Self-pay

## 2024-06-04 ENCOUNTER — Ambulatory Visit: Admitting: Family Medicine

## 2024-06-04 ENCOUNTER — Encounter: Payer: Self-pay | Admitting: Family Medicine

## 2024-06-04 VITALS — BP 118/72 | HR 72 | Ht 74.0 in | Wt 187.0 lb

## 2024-06-04 DIAGNOSIS — G8929 Other chronic pain: Secondary | ICD-10-CM | POA: Diagnosis not present

## 2024-06-04 DIAGNOSIS — M25511 Pain in right shoulder: Secondary | ICD-10-CM | POA: Diagnosis not present

## 2024-06-04 NOTE — Patient Instructions (Addendum)
 Thank you for coming in today.   You received an injection today. Seek immediate medical attention if the joint becomes red, extremely painful, or is oozing fluid.   If this shot doesn't help, let me know and I will order MRI's

## 2024-06-06 DIAGNOSIS — G4733 Obstructive sleep apnea (adult) (pediatric): Secondary | ICD-10-CM | POA: Diagnosis not present

## 2024-06-10 ENCOUNTER — Telehealth: Payer: Self-pay | Admitting: Family Medicine

## 2024-06-10 DIAGNOSIS — G8929 Other chronic pain: Secondary | ICD-10-CM

## 2024-06-10 DIAGNOSIS — M542 Cervicalgia: Secondary | ICD-10-CM

## 2024-06-10 NOTE — Telephone Encounter (Signed)
 Orders have been placed for MRI C-Spine and MRI Right shoulder to [facility]

## 2024-06-10 NOTE — Telephone Encounter (Signed)
 Patient called and stated that it was discussed that if he did not feel better that an MRI on his shoulder and neck on the right side could be ordered. He would like mri to be ordered to the location on [place] [address]. Please advise.

## 2024-06-10 NOTE — Telephone Encounter (Signed)
 Per visit note 06/04/24:  Assessment and Plan: 73 y.o. male with right trapezius pain and shoulder pain.  Patient has already had a great trial of physical therapy.  He has had some temporary relief with dry needling but it does not last.  Plan for trigger point injection today and if not sufficiently beneficial next step would be MRI C-spine and shoulder which we should be able to order with a phone call or MyChart message.

## 2024-06-15 ENCOUNTER — Inpatient Hospital Stay: Attending: Internal Medicine

## 2024-06-15 ENCOUNTER — Ambulatory Visit (HOSPITAL_COMMUNITY)
Admission: RE | Admit: 2024-06-15 | Discharge: 2024-06-15 | Disposition: A | Discharge status: Home / Self Care | Source: Ambulatory Visit | Attending: Internal Medicine | Admitting: Internal Medicine

## 2024-06-15 DIAGNOSIS — Z85118 Personal history of other malignant neoplasm of bronchus and lung: Secondary | ICD-10-CM | POA: Insufficient documentation

## 2024-06-15 DIAGNOSIS — Z902 Acquired absence of lung [part of]: Secondary | ICD-10-CM | POA: Diagnosis not present

## 2024-06-15 DIAGNOSIS — C3490 Malignant neoplasm of unspecified part of unspecified bronchus or lung: Secondary | ICD-10-CM

## 2024-06-15 DIAGNOSIS — I700 Atherosclerosis of aorta: Secondary | ICD-10-CM | POA: Diagnosis not present

## 2024-06-15 DIAGNOSIS — I2510 Atherosclerotic heart disease of native coronary artery without angina pectoris: Secondary | ICD-10-CM | POA: Diagnosis not present

## 2024-06-15 LAB — CBC WITH DIFFERENTIAL (CANCER CENTER ONLY)
Abs Immature Granulocytes: 0.02 K/uL (ref 0.00–0.07)
Basophils Absolute: 0 K/uL (ref 0.0–0.1)
Basophils Relative: 1 %
Eosinophils Absolute: 0 K/uL (ref 0.0–0.5)
Eosinophils Relative: 0 %
HCT: 45.3 % (ref 39.0–52.0)
Hemoglobin: 15.2 g/dL (ref 13.0–17.0)
Immature Granulocytes: 0 %
Lymphocytes Relative: 19 %
Lymphs Abs: 1.4 K/uL (ref 0.7–4.0)
MCH: 30.7 pg (ref 26.0–34.0)
MCHC: 33.6 g/dL (ref 30.0–36.0)
MCV: 91.5 fL (ref 80.0–100.0)
Monocytes Absolute: 0.6 K/uL (ref 0.1–1.0)
Monocytes Relative: 8 %
Neutro Abs: 5.3 K/uL (ref 1.7–7.7)
Neutrophils Relative %: 72 %
Platelet Count: 209 K/uL (ref 150–400)
RBC: 4.95 MIL/uL (ref 4.22–5.81)
RDW: 13.4 % (ref 11.5–15.5)
WBC Count: 7.4 K/uL (ref 4.0–10.5)
nRBC: 0 % (ref 0.0–0.2)

## 2024-06-15 LAB — CMP (CANCER CENTER ONLY)
ALT: 11 U/L (ref 0–44)
AST: 17 U/L (ref 15–41)
Albumin: 4.5 g/dL (ref 3.5–5.0)
Alkaline Phosphatase: 71 U/L (ref 38–126)
Anion gap: 5 (ref 5–15)
BUN: 15 mg/dL (ref 8–23)
CO2: 31 mmol/L (ref 22–32)
Calcium: 9.5 mg/dL (ref 8.9–10.3)
Chloride: 102 mmol/L (ref 98–111)
Creatinine: 0.87 mg/dL (ref 0.61–1.24)
GFR, Estimated: >60 mL/min (ref >60)
Glucose, Bld: 118 mg/dL — ABNORMAL HIGH (ref 70–99)
Potassium: 4.7 mmol/L (ref 3.5–5.1)
Sodium: 138 mmol/L (ref 135–145)
Total Bilirubin: 0.8 mg/dL (ref 0.0–1.2)
Total Protein: 6.9 g/dL (ref 6.5–8.1)

## 2024-06-15 MED ORDER — IOHEXOL 300 MG/ML  SOLN
100.0000 mL | Freq: Once | INTRAMUSCULAR | Status: AC | PRN
  Administered 2024-06-15: 75 mL via INTRAVENOUS

## 2024-06-17 ENCOUNTER — Other Ambulatory Visit

## 2024-06-22 ENCOUNTER — Inpatient Hospital Stay: Attending: Internal Medicine | Admitting: Internal Medicine

## 2024-06-22 VITALS — BP 122/76 | HR 68 | Temp 97.2°F | Resp 17 | Ht 74.0 in | Wt 184.7 lb

## 2024-06-22 DIAGNOSIS — C3490 Malignant neoplasm of unspecified part of unspecified bronchus or lung: Secondary | ICD-10-CM

## 2024-06-22 DIAGNOSIS — Z902 Acquired absence of lung [part of]: Secondary | ICD-10-CM | POA: Insufficient documentation

## 2024-06-22 DIAGNOSIS — Z85118 Personal history of other malignant neoplasm of bronchus and lung: Secondary | ICD-10-CM | POA: Diagnosis not present

## 2024-06-22 NOTE — Progress Notes (Signed)
 [facility] [phone]   [phone]  OFFICE PROGRESS NOTE  Catherine Charlies LABOR, DO [address] [place] [address]  DIAGNOSIS: Stage Ib (T2a, N0, M0) non-small cell lung cancer, adenocarcinoma presented with left upper lobe lung mass diagnosed in February 2025. Biomarker Findings HRD signature - Cannot Be Determined Microsatellite status - MS-Stable Tumor Mutational Burden - 1 Muts/Mb Genomic Findings For a complete list of the genes assayed, please refer to the Appendix. IDH1 R132L MAP2K1 (MEK1) F53_Q58>L NSD3 (WHSC1L1) E1148K TP53 G245S 8 Disease relevant genes with no reportable alteratio  PDL1 Expression 1%  PRIOR THERAPY: Status post left upper lobectomy with lymph node dissection on November 27, 2023 under the care of Dr. Shyrl.  The tumor size was 3.3 cm with visceral pleural involvement.  CURRENT THERAPY: Observation  INTERVAL HISTORY: Nicholas Graham 73 y.o. male returns to the clinic today for follow-up visit accompanied by his wife Nicholas Graham. Discussed the use of AI scribe software for clinical note transcription with the patient, who gave verbal consent to proceed.  History of Present Illness Nicholas Graham is a 73 year old male with stage IB non-small cell lung cancer who presents for evaluation with repeat CT scan for restaging of his disease. He is accompanied by his wife, Nicholas Graham.  He was diagnosed with stage IB non-small cell lung cancer, adenocarcinoma, in February 2025 and underwent a left upper lobectomy with lymph node sampling in March 2025. The tumor showed no actionable mutations and a PD-L1 expression of one percent. He is currently under observation.  He experiences occasional sharp pain on the left side, which he attributes to the surgery. The pain occurs with sudden movements or changes in weather.  No shortness of breath, hemoptysis, nausea, vomiting, diarrhea, headaches, or changes in vision. He mentions a  previous weight loss, but states that his weight is now stable.  He has resumed walking every day.     MEDICAL HISTORY: Past Medical History:  Diagnosis Date   Arm fracture    left. Dx'd w/RSD post fracture   Arthritis    Carpal tunnel syndrome    Chest pain 2016   [facility] (Dr. Johnnye) doing stress echo, but feels like musculoskeletal chest wall pain is cause of his discomfort   Chronic back pain    COVID-19 06/2020   Deviated septum    Diverticulosis    GERD (gastroesophageal reflux disease)    Hyperlipidemia    Hypertension    Joint pain    Muscle pain    OSA on CPAP 07/04/2018   - Trial of CPAP therapy on 17 cm H2O with a Large size [employer] Full Face Mask Simplus mask and heated humidification.   Plantar fasciitis    Pulmonary nodule 04/2016   [place].  Pt chose to repeat CT chest w/out contrast in 3 mo---as per radiologist's recommendations.   Right inguinal hernia    fat-containing.  Small hydrocele in left hemiscrotum.   Sleep apnea    Snoring    Swelling    of legs/feet/hands   Tobacco dependence in remission     ALLERGIES:  is allergic to penicillins, celebrex [celecoxib], and cymbalta [duloxetine hcl].  MEDICATIONS:  Current Outpatient Medications  Medication Sig Dispense Refill   amLODipine  (NORVASC ) 10 MG tablet Take 1 tablet (10 mg total) by mouth daily. 90 tablet 1   aspirin  81 MG tablet Take 81 mg by mouth daily.     baclofen  (LIORESAL )  20 MG tablet Take 0.5-1 tablets (10-20 mg total) by mouth 3 (three) times daily. 270 tablet 1   escitalopram  (LEXAPRO ) 20 MG tablet Take 1 tablet (20 mg total) by mouth daily. 90 tablet 1   gabapentin  (NEURONTIN ) 600 MG tablet Take 1 tablet (600 mg total) by mouth 3 (three) times daily. 270 tablet 1   hydrochlorothiazide  (HYDRODIURIL ) 25 MG tablet Take 1 tablet (25 mg total) by mouth daily. 90 tablet 1   lisinopril  (ZESTRIL ) 10 MG tablet Take 1 tablet (10 mg total) by mouth daily. 90 tablet 1   MegaRed  Omega-3 Krill Oil 500 MG CAPS Take 2 capsules by mouth daily.     MYRBETRIQ  50 MG TB24 tablet Take 50 mg by mouth daily.     oxyCODONE -acetaminophen  (PERCOCET) 10-325 MG tablet Take 1 tablet by mouth every 8 (eight) hours as needed for pain. 90 tablet 0   oxyCODONE -acetaminophen  (PERCOCET) 10-325 MG tablet Take 1 tablet by mouth every 8 (eight) hours as needed for pain. 90 tablet 0   oxyCODONE -acetaminophen  (PERCOCET) 10-325 MG tablet Take 1 tablet by mouth every 8 (eight) hours as needed for pain. 90 tablet 0   pantoprazole  (PROTONIX ) 40 MG tablet Take 1 tablet (40 mg total) by mouth daily. 90 tablet 1   simvastatin  (ZOCOR ) 10 MG tablet Take 1 tablet (10 mg total) by mouth every other day. 45 tablet 1   No current facility-administered medications for this visit.   Facility-Administered Medications Ordered in Other Visits  Medication Dose Route Frequency Provider Last Rate Last Admin   regadenoson  (LEXISCAN ) injection SOLN 0.4 mg  0.4 mg Intravenous Once Tobb, Kardie, DO       technetium tetrofosmin  (TC-MYOVIEW ) injection 30.2 millicurie  30.2 millicurie Intravenous Once PRN Tobb, Kardie, DO        SURGICAL HISTORY:  Past Surgical History:  Procedure Laterality Date   BRONCHIAL BIOPSY  10/28/2023   Procedure: BRONCHIAL BIOPSIES;  Surgeon: Shelah Lamar RAMAN, MD;  Location: [facility] ENDOSCOPY;  Service: Pulmonary;;   BRONCHIAL BRUSHINGS  10/28/2023   Procedure: BRONCHIAL BRUSHINGS;  Surgeon: Shelah Lamar RAMAN, MD;  Location: [facility] ENDOSCOPY;  Service: Pulmonary;;   BRONCHIAL NEEDLE ASPIRATION BIOPSY  10/28/2023   Procedure: BRONCHIAL NEEDLE ASPIRATION BIOPSIES;  Surgeon: Shelah Lamar RAMAN, MD;  Location: [facility] ENDOSCOPY;  Service: Pulmonary;;   BRONCHIAL WASHINGS  10/28/2023   Procedure: BRONCHIAL WASHINGS;  Surgeon: Shelah Lamar RAMAN, MD;  Location: [facility] ENDOSCOPY;  Service: Pulmonary;;   COLONOSCOPY     fracture repair left arm     had a fixator   INGUINAL HERNIA REPAIR Left 2013   repeat hernia repair. x2    INTERCOSTAL NERVE BLOCK  11/27/2023   Procedure: BLOCK, NERVE, INTERCOSTAL;  Surgeon: Shyrl Linnie KIDD, MD;  Location: [facility] OR;  Service: Thoracic;;   IR RADIOLOGIST EVAL & MGMT  07/22/2023   KNEE ARTHROSCOPY Right    LUMBAR DISC SURGERY  1993   LYMPH NODE BIOPSY  11/27/2023   Procedure: LYMPH NODE BIOPSY;  Surgeon: Shyrl Linnie KIDD, MD;  Location: [facility] OR;  Service: Thoracic;;   NEURECTOMY inguinal after hernia repair Left    PLANTAR FASCIA SURGERY Right 2000s   SHOULDER ARTHROSCOPY Right    UPPER GI ENDOSCOPY      REVIEW OF SYSTEMS:  A comprehensive review of systems was negative except for: Respiratory: positive for pleurisy/chest pain   PHYSICAL EXAMINATION: General appearance: alert, cooperative, and no distress Head: Normocephalic, without obvious abnormality, atraumatic Neck: no adenopathy, no JVD, supple, symmetrical,  trachea midline, and thyroid  not enlarged, symmetric, no tenderness/mass/nodules Lymph nodes: Cervical, supraclavicular, and axillary nodes normal. Resp: clear to auscultation bilaterally Back: symmetric, no curvature. ROM normal. No CVA tenderness. Cardio: regular rate and rhythm, S1, S2 normal, no murmur, click, rub or gallop GI: soft, non-tender; bowel sounds normal; no masses,  no organomegaly Extremities: extremities normal, atraumatic, no cyanosis or edema  ECOG PERFORMANCE STATUS: 1 - Symptomatic but completely ambulatory  Blood pressure 122/76, pulse 68, temperature (!) 97.2 F (36.2 C), resp. rate 17, height 6' 2 (1.88 m), weight 184 lb 11.2 oz (83.8 kg), SpO2 97%.  LABORATORY DATA: Lab Results  Component Value Date   WBC 7.4 06/15/2024   HGB 15.2 06/15/2024   HCT 45.3 06/15/2024   MCV 91.5 06/15/2024   PLT 209 06/15/2024      Chemistry      Component Value Date/Time   NA 138 06/15/2024 0901   NA 138 07/06/2018 0000   K 4.7 06/15/2024 0901   CL 102 06/15/2024 0901   CO2 31 06/15/2024 0901   BUN 15 06/15/2024 0901   BUN 17 07/06/2018  0000   CREATININE 0.87 06/15/2024 0901   GLU 124 07/06/2018 0000      Component Value Date/Time   CALCIUM 9.5 06/15/2024 0901   ALKPHOS 71 06/15/2024 0901   AST 17 06/15/2024 0901   ALT 11 06/15/2024 0901   BILITOT 0.8 06/15/2024 0901       RADIOGRAPHIC STUDIES: CT Chest W Contrast Result Date: 06/15/2024 CLINICAL DATA:  Non-small cell lung cancer (NSCLC), staging. * Tracking Code: BO * EXAM: CT CHEST WITH CONTRAST TECHNIQUE: Multidetector CT imaging of the chest was performed during intravenous contrast administration. RADIATION DOSE REDUCTION: This exam was performed according to the departmental dose-optimization program which includes automated exposure control, adjustment of the mA and/or kV according to patient size and/or use of iterative reconstruction technique. CONTRAST:  75mL OMNIPAQUE  IOHEXOL  300 MG/ML  SOLN COMPARISON:  CT scan chest from 08/29/2023 and nuclear medicine PET scan from 09/25/2023. FINDINGS: Cardiovascular: Normal cardiac size. No pericardial effusion. No aortic aneurysm. There are coronary artery calcifications, in keeping with coronary artery disease. There are also mild peripheral atherosclerotic vascular calcifications of thoracic aorta and its major branches. Mediastinum/Nodes: Visualized thyroid  gland appears grossly unremarkable. No solid / cystic mediastinal masses. There is small amount of fluid in the dilated lower thoracic esophagus, which is nonspecific but most likely seen in the settings of chronic gastroesophageal reflux disease versus esophageal dysmotility. No axillary, mediastinal or hilar lymphadenopathy by size criteria. Lungs/Pleura: The central tracheo-bronchial tree is patent. Postsurgical changes from left upper lobectomy noted. There are dependent changes in bilateral lungs. No mass or consolidation. No pleural effusion or pneumothorax. No suspicious lung nodules. Upper Abdomen: There is a partially seen partially calcified at least 8 x 10 mm  aneurysm arising from the right renal artery. There are several calcified granulomas in the left hepatic lobe of the liver and spleen. Remaining visualized upper abdominal viscera within normal limits. Musculoskeletal: The visualized soft tissues of the chest wall are grossly unremarkable. No suspicious osseous lesions. There are mild multilevel degenerative changes in the visualized spine. IMPRESSION: 1. Postsurgical changes from left upper lobectomy. No residual or recurrent tumor. No metastatic disease identified within the chest. 2. Multiple other nonacute observations, as described above. Aortic Atherosclerosis (ICD10-I70.0). Electronically Signed   By: Ree Molt M.D.   On: 06/15/2024 12:13    ASSESSMENT AND PLAN: This is a very pleasant 72  years old white male with Stage Ib (T2a, N0, M0) non-small cell lung cancer, adenocarcinoma presented with left upper lobe lung mass diagnosed in February 2025. Molecular studies showed no actionable mutations and PD-L1 expression was 1%.  The patient is status post left upper lobectomy with lymph node sampling in March 2025 under the care of Dr. Shyrl with the final tumor size 3.3 cm with visceropleural involvement. He is currently on observation. He had repeat CT scan of the chest that showed no concerning findings for disease recurrence or metastasis.  Assessment and Plan Assessment & Plan Stage IB non-small cell lung cancer, adenocarcinoma Diagnosed in February 2025, status post left upper lobectomy with lymph node sampling in March 2025. Tumor showed no actionable mutations and PD-L1 expression of 1%. Currently under surveillance with no evidence of residual or recurrent tumor and no metastatic disease on recent CT scan. Asymptomatic with stable weight and no concerning symptoms such as dyspnea, hemoptysis, or neurological changes. - Continue surveillance with CT scan of the chest every 6 months for the first 2 years, then annually until 5 years  post-surgery. - Schedule follow-up appointment in 6 months. - Order CT scan of the chest prior to the next follow-up appointment.  Post-thoracotomy pain syndrome, left chest Intermittent sharp pain in the left chest, likely related to post-surgical changes from the left upper lobectomy. Pain is exacerbated by sudden movements or changes in weather. No new concerning symptoms reported. The patient was advised to call immediately if he has any concerning symptoms in the interval. The patient voices understanding of current disease status and treatment options and is in agreement with the current care plan.  All questions were answered. The patient knows to call the clinic with any problems, questions or concerns. We can certainly see the patient much sooner if necessary.  The total time spent in the appointment was 20 minutes including review of chart and various tests results, discussions about plan of care and coordination of care plan .  Disclaimer: This note was dictated with voice recognition software. Similar sounding words can inadvertently be transcribed and may not be corrected upon review.

## 2024-06-23 ENCOUNTER — Ambulatory Visit
Admission: RE | Admit: 2024-06-23 | Discharge: 2024-06-23 | Disposition: A | Source: Ambulatory Visit | Attending: Family Medicine | Admitting: Family Medicine

## 2024-06-23 ENCOUNTER — Ambulatory Visit
Admission: RE | Admit: 2024-06-23 | Discharge: 2024-06-23 | Disposition: A | Discharge status: Home / Self Care | Source: Ambulatory Visit | Attending: Family Medicine | Admitting: Family Medicine

## 2024-06-23 DIAGNOSIS — G8929 Other chronic pain: Secondary | ICD-10-CM

## 2024-06-23 DIAGNOSIS — M4802 Spinal stenosis, cervical region: Secondary | ICD-10-CM | POA: Diagnosis not present

## 2024-06-23 DIAGNOSIS — M25511 Pain in right shoulder: Secondary | ICD-10-CM | POA: Diagnosis not present

## 2024-06-23 DIAGNOSIS — M47812 Spondylosis without myelopathy or radiculopathy, cervical region: Secondary | ICD-10-CM | POA: Diagnosis not present

## 2024-06-23 DIAGNOSIS — M542 Cervicalgia: Secondary | ICD-10-CM

## 2024-06-23 DIAGNOSIS — M19011 Primary osteoarthritis, right shoulder: Secondary | ICD-10-CM | POA: Diagnosis not present

## 2024-06-29 ENCOUNTER — Ambulatory Visit: Payer: Self-pay | Admitting: Family Medicine

## 2024-06-29 NOTE — Progress Notes (Signed)
 Cervical spine MRI show areas of potential pinched nerves.  We could try a injection in the neck with radiology.  The shoulder MRI is still pending.

## 2024-07-01 NOTE — Progress Notes (Signed)
 Right shoulder x-ray shows rotator cuff tendinitis but no rotator cuff tear.  There is medium arthritis in the main shoulder joint.  I do think returning to clinic may be a good idea.

## 2024-07-07 DIAGNOSIS — M25511 Pain in right shoulder: Secondary | ICD-10-CM | POA: Insufficient documentation

## 2024-07-08 DIAGNOSIS — M25511 Pain in right shoulder: Secondary | ICD-10-CM | POA: Diagnosis not present

## 2024-07-09 DIAGNOSIS — G542 Cervical root disorders, not elsewhere classified: Secondary | ICD-10-CM | POA: Insufficient documentation

## 2024-07-09 DIAGNOSIS — M5412 Radiculopathy, cervical region: Secondary | ICD-10-CM | POA: Diagnosis not present

## 2024-07-09 DIAGNOSIS — M542 Cervicalgia: Secondary | ICD-10-CM | POA: Diagnosis not present

## 2024-07-16 DIAGNOSIS — M25519 Pain in unspecified shoulder: Secondary | ICD-10-CM | POA: Diagnosis not present

## 2024-07-16 DIAGNOSIS — R293 Abnormal posture: Secondary | ICD-10-CM | POA: Diagnosis not present

## 2024-07-16 DIAGNOSIS — M542 Cervicalgia: Secondary | ICD-10-CM | POA: Diagnosis not present

## 2024-07-16 NOTE — Progress Notes (Unsigned)
 Subjective:   Nicholas Graham is a 73 y.o. male who presents for Medicare Annual/Subsequent preventive examination.  Visit Complete: {VISITMETHODVS:[id number]}  Patient Medicare AWV questionnaire was completed by the patient on ***; I have confirmed that all information answered by patient is correct and no changes since this date.        Objective:    There were no vitals filed for this visit. There is no height or weight on file to calculate BMI.     11/27/2023    3:00 PM 11/25/2023    1:01 PM 11/06/2023   10:50 AM 10/28/2023   10:13 AM 06/19/2023   11:08 AM 06/15/2022    1:10 PM 06/10/2021    8:33 AM  Advanced Directives  Does Patient Have a Medical Advance Directive? No No No No No No No  Would patient like information on creating a medical advance directive? No - Patient declined No - Patient declined No - Patient declined No - Patient declined No - Guardian declined No - Patient declined No - Patient declined    Current Medications (verified) Outpatient Encounter Medications as of 07/17/2024  Medication Sig   amLODipine  (NORVASC ) 10 MG tablet Take 1 tablet (10 mg total) by mouth daily.   aspirin  81 MG tablet Take 81 mg by mouth daily.   baclofen  (LIORESAL ) 20 MG tablet Take 0.5-1 tablets (10-20 mg total) by mouth 3 (three) times daily.   escitalopram  (LEXAPRO ) 20 MG tablet Take 1 tablet (20 mg total) by mouth daily.   gabapentin  (NEURONTIN ) 600 MG tablet Take 1 tablet (600 mg total) by mouth 3 (three) times daily.   hydrochlorothiazide  (HYDRODIURIL ) 25 MG tablet Take 1 tablet (25 mg total) by mouth daily.   lisinopril  (ZESTRIL ) 10 MG tablet Take 1 tablet (10 mg total) by mouth daily.   MegaRed Omega-3 Krill Oil 500 MG CAPS Take 2 capsules by mouth daily.   MYRBETRIQ  50 MG TB24 tablet Take 50 mg by mouth daily.   oxyCODONE -acetaminophen  (PERCOCET) 10-325 MG tablet Take 1 tablet by mouth every 8 (eight) hours as needed for pain.   oxyCODONE -acetaminophen  (PERCOCET) 10-325  MG tablet Take 1 tablet by mouth every 8 (eight) hours as needed for pain.   oxyCODONE -acetaminophen  (PERCOCET) 10-325 MG tablet Take 1 tablet by mouth every 8 (eight) hours as needed for pain.   pantoprazole  (PROTONIX ) 40 MG tablet Take 1 tablet (40 mg total) by mouth daily.   simvastatin  (ZOCOR ) 10 MG tablet Take 1 tablet (10 mg total) by mouth every other day.   Facility-Administered Encounter Medications as of 07/17/2024  Medication   regadenoson  (LEXISCAN ) injection SOLN 0.4 mg   technetium tetrofosmin  (TC-MYOVIEW ) injection 30.2 millicurie    Allergies (verified) Penicillins, Celebrex [celecoxib], and Cymbalta [duloxetine hcl]   History: Past Medical History:  Diagnosis Date   Arm fracture    left. Dx'd w/RSD post fracture   Arthritis    Carpal tunnel syndrome    Chest pain 2016   [facility] (Dr. Johnnye) doing stress echo, but feels like musculoskeletal chest wall pain is cause of his discomfort   Chronic back pain    COVID-19 06/2020   Deviated septum    Diverticulosis    GERD (gastroesophageal reflux disease)    Hyperlipidemia    Hypertension    Joint pain    Muscle pain    OSA on CPAP 07/04/2018   - Trial of CPAP therapy on 17 cm H2O with a Large size Fisher[employer]Paykel Full Face Mask Simplus mask  and heated humidification.   Plantar fasciitis    Pulmonary nodule 04/2016   Lingula.  Pt chose to repeat CT chest w/out contrast in 3 mo---as per radiologist's recommendations.   Right inguinal hernia    fat-containing.  Small hydrocele in left hemiscrotum.   Sleep apnea    Snoring    Swelling    of legs/feet/hands   Tobacco dependence in remission    Past Surgical History:  Procedure Laterality Date   BRONCHIAL BIOPSY  10/28/2023   Procedure: BRONCHIAL BIOPSIES;  Surgeon: Shelah Lamar RAMAN, MD;  Location: [facility] ENDOSCOPY;  Service: Pulmonary;;   BRONCHIAL BRUSHINGS  10/28/2023   Procedure: BRONCHIAL BRUSHINGS;  Surgeon: Shelah Lamar RAMAN, MD;  Location: [facility] ENDOSCOPY;   Service: Pulmonary;;   BRONCHIAL NEEDLE ASPIRATION BIOPSY  10/28/2023   Procedure: BRONCHIAL NEEDLE ASPIRATION BIOPSIES;  Surgeon: Shelah Lamar RAMAN, MD;  Location: [facility] ENDOSCOPY;  Service: Pulmonary;;   BRONCHIAL WASHINGS  10/28/2023   Procedure: BRONCHIAL WASHINGS;  Surgeon: Shelah Lamar RAMAN, MD;  Location: [facility] ENDOSCOPY;  Service: Pulmonary;;   COLONOSCOPY     fracture repair left arm     had a fixator   INGUINAL HERNIA REPAIR Left 2013   repeat hernia repair. x2   INTERCOSTAL NERVE BLOCK  11/27/2023   Procedure: BLOCK, NERVE, INTERCOSTAL;  Surgeon: Shyrl Linnie KIDD, MD;  Location: [facility] OR;  Service: Thoracic;;   IR RADIOLOGIST EVAL & MGMT  07/22/2023   KNEE ARTHROSCOPY Right    LUMBAR DISC SURGERY  1993   LYMPH NODE BIOPSY  11/27/2023   Procedure: LYMPH NODE BIOPSY;  Surgeon: Shyrl Linnie KIDD, MD;  Location: [facility] OR;  Service: Thoracic;;   NEURECTOMY inguinal after hernia repair Left    PLANTAR FASCIA SURGERY Right 2000s   SHOULDER ARTHROSCOPY Right    UPPER GI ENDOSCOPY     Family History  Problem Relation Age of Onset   Lung cancer Mother    Lung cancer Father    Bone cancer Brother        bone   Heart disease Maternal Uncle    Colon cancer Neg Hx    Esophageal cancer Neg Hx    Stomach cancer Neg Hx    Social History   Socioeconomic History   Marital status: Married    Spouse name: Not on file   Number of children: 2   Years of education: Not on file   Highest education level: 8th grade  Occupational History   Not on file  Tobacco Use   Smoking status: Former    Current packs/day: 0.00    Average packs/day: 3.0 packs/day for 30.0 years (90.0 ttl pk-yrs)    Types: Cigarettes    Start date: 07/16/1972    Quit date: 07/16/2002    Years since quitting: 22.0   Smokeless tobacco: Never  Vaping Use   Vaping status: Never Used  Substance and Sexual Activity   Alcohol use: Yes    Alcohol/week: 1.0 - 2.0 standard drink of alcohol    Types: 1 - 2 Cans of beer per week     Comment: socially   Drug use: No   Sexual activity: Yes  Other Topics Concern   Not on file  Social History Narrative   Married, [place]. Children (2) Adult, 4 grandchildren.    9 th grade, Retired.   Wears seatbelt   Smoke detector in the home.    Firearms locked in the home.    Feels safe in his relationships.  Social Drivers of Corporate Investment Banker Strain: Low Risk  (04/29/2024)   Overall Financial Resource Strain (CARDIA)    Difficulty of Paying Living Expenses: Not hard at all  Food Insecurity: No Food Insecurity (04/29/2024)   Hunger Vital Sign    Worried About Running Out of Food in the Last Year: Never true    Ran Out of Food in the Last Year: Never true  Transportation Needs: No Transportation Needs (04/29/2024)   PRAPARE - Administrator, Civil Service (Medical): No    Lack of Transportation (Non-Medical): No  Physical Activity: Sufficiently Active (04/29/2024)   Exercise Vital Sign    Days of Exercise per Week: 6 days    Minutes of Exercise per Session: 60 min  Stress: No Stress Concern Present (04/29/2024)   [employer] of Occupational Health - Occupational Stress Questionnaire    Feeling of Stress: Not at all  Social Connections: Moderately Integrated (04/29/2024)   Social Connection and Isolation Panel    Frequency of Communication with Friends and Family: Three times a week    Frequency of Social Gatherings with Friends and Family: Once a week    Attends Religious Services: More than 4 times per year    Active Member of [employer] or Organizations: No    Attends Engineer, Structural: Not on file    Marital Status: Married    Tobacco Counseling Counseling given: Not Answered   Clinical Intake:                        Activities of Daily Living    11/27/2023    3:00 PM 11/25/2023    1:03 PM  In your present state of health, do you have any difficulty performing the following activities:  Hearing? 0    Vision? 0   Difficulty concentrating or making decisions? 0   Doing errands, shopping? 0 0    Patient Care Team: Catherine Charlies LABOR, DO as PCP - General (Family Medicine) Barbarann Oneil BROCKS, MD (Inactive) as Consulting Physician (Orthopedic Surgery) Armbruster, Elspeth SQUIBB, MD as Consulting Physician (Gastroenterology) Elisabeth Valli BIRCH, MD as Consulting Physician (Urology) Neysa Reggy BIRCH, MD as Consulting Physician (Pulmonary Disease) Shyrl Linnie KIDD, MD as Consulting Physician (Cardiothoracic Surgery) Joane Artist RAMAN, MD as Consulting Physician (Sports Medicine)  Indicate any recent Medical Services you may have received from other than [facility] providers in the past year (date may be approximate).     Assessment:   This is a routine wellness examination for Cipriano.  Hearing/Vision screen No results found.   Goals Addressed   None    Depression Screen    02/06/2024    7:57 AM 11/06/2023   11:23 AM 10/08/2023    8:19 AM 06/19/2023    9:02 AM 01/18/2023   10:26 AM 11/13/2022    8:16 AM 06/15/2022    1:06 PM  PHQ 2/9 Scores  PHQ - 2 Score  0 0 0 0 0 0  Exception Documentation Patient refusal          Fall Risk    02/06/2024    7:57 AM 11/04/2023   10:37 AM 10/10/2023    4:08 PM 10/08/2023    8:19 AM 06/19/2023    9:02 AM  Fall Risk   Falls in the past year? 0 0 0 0 0  Number falls in past yr:    0 0  Injury with Fall?    0 0  Risk for fall due to :    No Fall Risks No Fall Risks  Follow up Falls evaluation completed   Falls evaluation completed Falls evaluation completed    MEDICARE RISK AT HOME:    TIMED UP AND GO:  Was the test performed?  {AMBTIMEDUPGO:[phone]}    Cognitive Function:        06/19/2023    9:03 AM 06/15/2022    1:10 PM 06/10/2021    8:34 AM  6CIT Screen  What Year? 0 points 0 points 0 points  What month? 0 points 0 points 0 points  What time? 0 points 0 points 3 points  Count back from 20 0 points 0 points 0 points  Months in reverse 0 points  4  points  Repeat phrase 0 points 0 points 10 points  Total Score 0 points  17 points    Immunizations  There is no immunization history on file for this patient.  {TDAP status:2101805}  {Flu Vaccine status:2101806}  {Pneumococcal vaccine status:2101807}  {Covid-19 vaccine status:2101808}  Qualifies for Shingles Vaccine? {YES/NO:21197}  Zostavax completed {YES/NO:21197}  {Shingrix Completed?:2101804}  Screening Tests Health Maintenance  Topic Date Due   Pneumococcal Vaccine: 50+ Years (1 of 2 - PCV) Never done   Medicare Annual Wellness (AWV)  06/18/2024   Influenza Vaccine  12/15/2024 (Originally 04/17/2024)   Colonoscopy  09/05/2029   Hepatitis C Screening  Completed   Meningococcal B Vaccine  Aged Out   DTaP/Tdap/Td  Discontinued   COVID-19 Vaccine  Discontinued   Zoster Vaccines- Shingrix  Discontinued    Health Maintenance  Health Maintenance Due  Topic Date Due   Pneumococcal Vaccine: 50+ Years (1 of 2 - PCV) Never done   Medicare Annual Wellness (AWV)  06/18/2024    {Colorectal cancer screening:2101809}  Lung Cancer Screening: (Low Dose CT Chest recommended if Age 57-80 years, 20 pack-year currently smoking OR have quit w/in 15years.) {DOES NOT does:27190::does not} qualify.   Lung Cancer Screening Referral: ***  Additional Screening:  Hepatitis C Screening: {DOES NOT does:27190::does not} qualify; Completed ***  Vision Screening: Recommended annual ophthalmology exams for early detection of glaucoma and other disorders of the eye. Is the patient up to date with their annual eye exam?  {YES/NO:21197} Who is the provider or what is the name of the office in which the patient attends annual eye exams? *** If pt is not established with a provider, would they like to be referred to a provider to establish care? {YES/NO:21197}.   Dental Screening: Recommended annual dental exams for proper oral hygiene  Diabetic Foot Exam: {Diabetic Foot  Exam:2101802}  Community Resource Referral / Chronic Care Management: CRR required this visit?  {YES/NO:21197}  CCM required this visit?  {CCM Required choices:[phone]}     Plan:     I have personally reviewed and noted the following in the patient's chart:   Medical and social history Use of alcohol, tobacco or illicit drugs  Current medications and supplements including opioid prescriptions. {Opioid Prescriptions:[phone]} Functional ability and status Nutritional status Physical activity Advanced directives List of other physicians Hospitalizations, surgeries, and ER visits in previous 12 months Vitals Screenings to include cognitive, depression, and falls Referrals and appointments  In addition, I have reviewed and discussed with patient certain preventive protocols, quality metrics, and best practice recommendations. A written personalized care plan for preventive services as well as general preventive health recommendations were provided to patient.     Shanda LELON Sharps, CMA   07/16/2024  After Visit Summary: {CHL AMB AWV After Visit Summary:[id number]}  Nurse Notes: ***

## 2024-07-16 NOTE — Patient Instructions (Signed)
 Return in 14 weeks (on 10/23/2024) for Routine chronic condition follow-up .        Great to see you today.  I have refilled the medication(s) we provide.   If labs were collected or images ordered, we will inform you of  results once we have received them and reviewed. We will contact you either by echart message, or telephone call.  Please give ample time to the testing facility, and our office to run,  receive and review results. Please do not call inquiring of results, even if you can see them in your chart. We will contact you as soon as we are able. If it has been over 1 week since the test was completed, and you have not yet heard from us , then please call us .    - echart message- for normal results that have been seen by the patient already.   - telephone call: abnormal results or if patient has not viewed results in their echart.  If a referral to a specialist was entered for you, please call us  in 2 weeks if you have not heard from the specialist office to schedule.

## 2024-07-17 ENCOUNTER — Encounter: Payer: Self-pay | Admitting: Family Medicine

## 2024-07-17 ENCOUNTER — Ambulatory Visit (INDEPENDENT_AMBULATORY_CARE_PROVIDER_SITE_OTHER): Admitting: Family Medicine

## 2024-07-17 VITALS — BP 122/78 | HR 72 | Temp 98.2°F | Wt 186.0 lb

## 2024-07-17 DIAGNOSIS — K5903 Drug induced constipation: Secondary | ICD-10-CM | POA: Diagnosis not present

## 2024-07-17 DIAGNOSIS — F419 Anxiety disorder, unspecified: Secondary | ICD-10-CM

## 2024-07-17 DIAGNOSIS — G894 Chronic pain syndrome: Secondary | ICD-10-CM

## 2024-07-17 DIAGNOSIS — I10 Essential (primary) hypertension: Secondary | ICD-10-CM

## 2024-07-17 DIAGNOSIS — Z9889 Other specified postprocedural states: Secondary | ICD-10-CM

## 2024-07-17 DIAGNOSIS — M51362 Other intervertebral disc degeneration, lumbar region with discogenic back pain and lower extremity pain: Secondary | ICD-10-CM | POA: Diagnosis not present

## 2024-07-17 DIAGNOSIS — M4802 Spinal stenosis, cervical region: Secondary | ICD-10-CM | POA: Diagnosis not present

## 2024-07-17 DIAGNOSIS — G4733 Obstructive sleep apnea (adult) (pediatric): Secondary | ICD-10-CM | POA: Diagnosis not present

## 2024-07-17 DIAGNOSIS — Z0000 Encounter for general adult medical examination without abnormal findings: Secondary | ICD-10-CM

## 2024-07-17 DIAGNOSIS — G542 Cervical root disorders, not elsewhere classified: Secondary | ICD-10-CM

## 2024-07-17 DIAGNOSIS — Z0289 Encounter for other administrative examinations: Secondary | ICD-10-CM

## 2024-07-17 DIAGNOSIS — Z23 Encounter for immunization: Secondary | ICD-10-CM

## 2024-07-17 DIAGNOSIS — E785 Hyperlipidemia, unspecified: Secondary | ICD-10-CM | POA: Diagnosis not present

## 2024-07-17 DIAGNOSIS — I700 Atherosclerosis of aorta: Secondary | ICD-10-CM | POA: Diagnosis not present

## 2024-07-17 MED ORDER — HYDROCHLOROTHIAZIDE 25 MG PO TABS: 25.0000 mg | ORAL_TABLET | Freq: Every day | ORAL | 1 refills | Status: AC

## 2024-07-17 MED ORDER — OXYCODONE-ACETAMINOPHEN 10-325 MG PO TABS: 1.0000 | ORAL_TABLET | Freq: Three times a day (TID) | ORAL | 0 refills | Status: AC | PRN

## 2024-07-17 MED ORDER — SIMVASTATIN 10 MG PO TABS: 10.0000 mg | ORAL_TABLET | ORAL | 1 refills | Status: AC

## 2024-07-17 MED ORDER — PANTOPRAZOLE SODIUM 40 MG PO TBEC: 40.0000 mg | DELAYED_RELEASE_TABLET | Freq: Every day | ORAL | 1 refills | Status: AC

## 2024-07-17 MED ORDER — AMLODIPINE BESYLATE 10 MG PO TABS: 10.0000 mg | ORAL_TABLET | Freq: Every day | ORAL | 1 refills | Status: AC

## 2024-07-17 MED ORDER — FAMOTIDINE 20 MG PO TABS: 20.0000 mg | ORAL_TABLET | Freq: Two times a day (BID) | ORAL | 1 refills | Status: AC

## 2024-07-17 MED ORDER — ESCITALOPRAM OXALATE 20 MG PO TABS
20.0000 mg | ORAL_TABLET | Freq: Every day | ORAL | 1 refills | Status: AC
Start: 2024-07-17 — End: ?

## 2024-07-17 MED ORDER — LUBIPROSTONE 24 MCG PO CAPS: 24.0000 ug | ORAL_CAPSULE | Freq: Every day | ORAL | 3 refills | Status: AC

## 2024-07-17 MED ORDER — GABAPENTIN 600 MG PO TABS: 600.0000 mg | ORAL_TABLET | Freq: Three times a day (TID) | ORAL | 1 refills | Status: AC

## 2024-07-17 MED ORDER — LISINOPRIL 10 MG PO TABS: 10.0000 mg | ORAL_TABLET | Freq: Every day | ORAL | 1 refills | Status: AC

## 2024-07-17 NOTE — Progress Notes (Signed)
 Patient ID: Nicholas Graham, male  DOB: 04/22/1951, 73 y.o.   MRN: [id number] Patient Care Team    Relationship Specialty Notifications Start End  Nicholas Charlies LABOR, DO PCP - General Family Medicine  10/31/15   Nicholas Graham BROCKS, MD (Inactive) Consulting Physician Orthopedic Surgery  10/26/21   Armbruster, Elspeth SQUIBB, MD Consulting Physician Gastroenterology  05/29/23   Nicholas Valli BIRCH, MD Consulting Physician Urology  05/29/23   Nicholas Reggy BIRCH, MD Consulting Physician Pulmonary Disease  10/08/23   Nicholas Linnie KIDD, MD Consulting Physician Cardiothoracic Surgery  11/21/23   Nicholas Artist RAMAN, MD Consulting Physician Sports Medicine  11/21/23   Nicholas Oneil, MD Consulting Physician Orthopedic Surgery  07/17/24   Nicholas Sherrod, MD Consulting Physician Oncology  07/17/24     Chief Complaint  Patient presents with   Hypertension   Hyperlipidemia    Chronic pain management Chronic conditions Prevnar- declined    Subjective:  Nicholas Graham is a 73 y.o. male present for Chronic Conditions/illness Management  All past medical history, surgical history, allergies, family history, immunizations, medications and social history were updated in the electronic medical record today. All recent labs, ED visits and hospitalizations within the last year were reviewed.  Cervical neck pain with evidence of disc disease/Chronic midline thoracic back pain/Thoracic degenerative disc disease/DDD (degenerative disc disease), lumbar/History of lumbar discectomy Pt presents for his chronic pain management treatment for his cervical and lumbar conditions. His discomfort has progressed since 2020-current regimen provides enough relief to allow him to increase quality of life and remain active.  He has been evaluated  by neuro and pain rehab. He has received injections- that have only provided a few days of temporary relief in the past . He is frustrated, but has appropriate expectations that he will be living  with some pain.   -He reports he is maintaining quality of life with the use of opiates, that allows him to remain active with Percocet 10-325 (1 tab every 8-12 hours), baclofen  and gabapentin . 600/600/600 (had headaches at increased doses).  No negative side effects reported. Indication for chronic opioid: Cervical spine degeneration, chronic neck and back pain. Medication and dose: Oxycodone -acetaminophen  10-325 1 tab TID prn daily # pills per month: 60 Last UDS date:UYD 08/2023 Pain contract signed (Y/N): Yes- UTD Date narcotic database last reviewed (/include red flags): 07/17/24  03/08/2021  had epidural injection of cervical spine and he feels it is helpful.  11/2021-has had repeat injections that have not been as helpful. 08/2023: Dr, Elnor- performed injections.  11/27/2023: Intercostal nerve block   Original note: Lower cervical to mid thoracic pain still present. Using his right arm exacerbates his pain midline of thoracic.  He has a history thoracic spine degeneration and discectomy in his lumbar spine.  Has degenerative changes, bone spurring and facet arthropathy in his lumbar spine by x-rays obtained 11/2018. Cervical spine x-ray with mild bilateral foraminal narrowing at C3-C4 11/2018. Unfortunately he was reported as allergic or intolerant to many of the medications that would be helpful for him such as gabapentin , Lyrica, Celebrex and Cymbalta --> we discussed this in more detail last visit and he believes that the allergy-hallucinations  was secondary to having multiple of these medicines on board at the same time back in 2014. We tried to start gabapentin  and it did not have any side effects from medication. It has not been extremely helpful  Yet at lower doses. He admits to rather sigmnificant stomach upset starting from  the naproxen  BID use ober the last 8 week. Advanced imaging has not bee able to be completed 2/2 to covid outbreak. He desired to wait on referral to specialist  last visit in hope that he would receive benefit from the gabapentin .   Hypertension/HLD/overweight: Pt reports compliance with amlodipine  10 mg daily, lisinopril  10 mg QD and HCTZ.  Patient denies chest pain, shortness of breath, dizziness or lower extremity edema.      Pt takes a daily baby ASA. Pt is  prescribed statin.  Labs UTD Diet: Low-sodium Exercise: Not exercising as routinely. RF: Hypertension, hyperlipidemia, family history of heart disease. Overweight.   ANXIETY/sleep disorder:  Patient reports he is feeling well on Lexapro  20 mg daily      07/17/2024    8:20 AM 11/06/2023   11:23 AM 10/08/2023    8:19 AM 06/19/2023    9:02 AM 01/18/2023   10:26 AM  Depression screen PHQ 2/9  Decreased Interest 0 0 0 0 0  Down, Depressed, Hopeless 0 0 0 0 0  PHQ - 2 Score 0 0 0 0 0      11/30/2021    8:32 AM 01/14/2020    8:20 AM 12/16/2019    8:06 AM 06/23/2019    8:30 AM  GAD 7 : Generalized Anxiety Score  Nervous, Anxious, on Edge 0 0 0 0  Control/stop worrying 0 0 0 0  Worry too much - different things 0 0 0 0  Trouble relaxing 0 3 0 0  Restless 0 3 3 1   Easily annoyed or irritable 0 1 0 0  Afraid - awful might happen 0 0 0 0  Total GAD 7 Score 0 7 3 1   Anxiety Difficulty  Not difficult at all Somewhat difficult Not difficult at all           07/17/2024    8:20 AM 02/06/2024    7:57 AM 11/04/2023   10:37 AM 10/10/2023    4:08 PM 10/08/2023    8:19 AM  Fall Risk   Falls in the past year? 0 0 0 0 0  Number falls in past yr: 0    0  Injury with Fall? 0    0  Risk for fall due to : No Fall Risks    No Fall Risks  Follow up Falls evaluation completed Falls evaluation completed   Falls evaluation completed     There is no immunization history on file for this patient.   Past Medical History:  Diagnosis Date   Arm fracture    left. Dx'd w/RSD post fracture   Arthritis    Carpal tunnel syndrome    Chest pain 2016   [facility] (Dr. Johnnye) doing stress  echo, but feels like musculoskeletal chest wall pain is cause of his discomfort   Chronic back pain    COVID-19 06/2020   Deviated septum    Diverticulosis    GERD (gastroesophageal reflux disease)    Hyperlipidemia    Hypertension    Joint pain    Muscle pain    OSA on CPAP 07/04/2018   - Trial of CPAP therapy on 17 cm H2O with a Large size [employer] Full Face Mask Simplus mask and heated humidification.   Plantar fasciitis    Pulmonary nodule 04/2016   [place].  Pt chose to repeat CT chest w/out contrast in 3 mo---as per radiologist's recommendations.   Right inguinal hernia    fat-containing.  Small hydrocele in left hemiscrotum.  Sleep apnea    Snoring    Swelling    of legs/feet/hands   Tobacco dependence in remission    Allergies  Allergen Reactions   Penicillins Hives   Celebrex [Celecoxib] Other (See Comments)    unknown reaction   Cymbalta [Duloxetine Hcl] Other (See Comments)    unknown   Past Surgical History:  Procedure Laterality Date   BRONCHIAL BIOPSY  10/28/2023   Procedure: BRONCHIAL BIOPSIES;  Surgeon: Shelah Lamar RAMAN, MD;  Location: [facility] ENDOSCOPY;  Service: Pulmonary;;   BRONCHIAL BRUSHINGS  10/28/2023   Procedure: BRONCHIAL BRUSHINGS;  Surgeon: Shelah Lamar RAMAN, MD;  Location: [facility] ENDOSCOPY;  Service: Pulmonary;;   BRONCHIAL NEEDLE ASPIRATION BIOPSY  10/28/2023   Procedure: BRONCHIAL NEEDLE ASPIRATION BIOPSIES;  Surgeon: Shelah Lamar RAMAN, MD;  Location[facility] ENDOSCOPY;  Service: Pulmonary;;   BRONCHIAL WASHINGS  10/28/2023   Procedure: BRONCHIAL WASHINGS;  Surgeon: Shelah Lamar RAMAN, MD;  Location: [facility] ENDOSCOPY;  Service: Pulmonary;;   COLONOSCOPY     fracture repair left arm     had a fixator   INGUINAL HERNIA REPAIR Left 2013   repeat hernia repair. x2   INTERCOSTAL NERVE BLOCK  11/27/2023   Procedure: BLOCK, NERVE, INTERCOSTAL;  Surgeon: Nicholas Linnie KIDD, MD;  Location: [facility] OR;  Service: Thoracic;;   IR RADIOLOGIST EVAL & MGMT  07/22/2023   KNEE  ARTHROSCOPY Right    LUMBAR DISC SURGERY  1993   LYMPH NODE BIOPSY  11/27/2023   Procedure: LYMPH NODE BIOPSY;  Surgeon: Nicholas Linnie KIDD, MD;  Location: [facility] OR;  Service: Thoracic;;   NEURECTOMY inguinal after hernia repair Left    PLANTAR FASCIA SURGERY Right 2000s   SHOULDER ARTHROSCOPY Right    UPPER GI ENDOSCOPY     Family History  Problem Relation Age of Onset   Lung cancer Mother    Lung cancer Father    Bone cancer Brother        bone   Heart disease Maternal Uncle    Colon cancer Neg Hx    Esophageal cancer Neg Hx    Stomach cancer Neg Hx    Social History   Social History Narrative   Married, [place]. Children (2) Adult, 4 grandchildren.    9 th grade, Retired.   Wears seatbelt   Smoke detector in the home.    Firearms locked in the home.    Feels safe in his relationships.     Allergies as of 07/17/2024       Reactions   Penicillins Hives   Celebrex [celecoxib] Other (See Comments)   unknown reaction   Cymbalta [duloxetine Hcl] Other (See Comments)   unknown        Medication List        Accurate as of July 17, 2024 12:06 PM. If you have any questions, ask your nurse or doctor.          STOP taking these medications    baclofen  20 MG tablet Commonly known as: LIORESAL  Stopped by: Charlies Bellini   methocarbamol 500 MG tablet Commonly known as: ROBAXIN Stopped by: Charlies Bellini       TAKE these medications    amLODipine  10 MG tablet Commonly known as: NORVASC  Take 1 tablet (10 mg total) by mouth daily.   aspirin  81 MG tablet Take 81 mg by mouth daily.   escitalopram  20 MG tablet Commonly known as: LEXAPRO  Take 1 tablet (20 mg total) by mouth daily.   famotidine  20 MG tablet Commonly known  as: PEPCID  Take 1 tablet (20 mg total) by mouth 2 (two) times daily.   gabapentin  600 MG tablet Commonly known as: NEURONTIN  Take 1 tablet (600 mg total) by mouth 3 (three) times daily.   hydrochlorothiazide  25 MG tablet Commonly  known as: HYDRODIURIL  Take 1 tablet (25 mg total) by mouth daily.   lisinopril  10 MG tablet Commonly known as: ZESTRIL  Take 1 tablet (10 mg total) by mouth daily.   lubiprostone  24 MCG capsule Commonly known as: AMITIZA  Take 1 capsule (24 mcg total) by mouth daily with breakfast. What changed: See the new instructions. Changed by: Zenita Kister   MegaRed Omega-3 Krill Oil 500 MG Caps Take 2 capsules by mouth daily.   metoCLOPramide  5 MG tablet Commonly known as: REGLAN  Take 1 tablet (5 mg total) by mouth 3 (three) times daily before meals.   Myrbetriq  50 MG Tb24 tablet Generic drug: mirabegron  ER Take 50 mg by mouth daily.   oxyCODONE -acetaminophen  10-325 MG tablet Commonly known as: PERCOCET Take 1 tablet by mouth every 8 (eight) hours as needed for pain. What changed:  Another medication with the same name was added. Make sure you understand how and when to take each. Another medication with the same name was changed. Make sure you understand how and when to take each. Changed by: Rodd Heft   oxyCODONE -acetaminophen  10-325 MG tablet Commonly known as: PERCOCET Take 1 tablet by mouth every 8 (eight) hours as needed for pain. Start taking on: August 09, 2024 What changed: These instructions start on August 09, 2024. If you are unsure what to do until then, ask your doctor or other care provider. Changed by: Kyrianna Barletta   oxyCODONE -acetaminophen  10-325 MG tablet Commonly known as: PERCOCET Take 1 tablet by mouth every 8 (eight) hours as needed for pain. Start taking on: September 01, 2024 What changed: These instructions start on September 01, 2024. If you are unsure what to do until then, ask your doctor or other care provider. Changed by: Londan Coplen   oxyCODONE -acetaminophen  10-325 MG tablet Commonly known as: PERCOCET Take 1 tablet by mouth every 8 (eight) hours as needed for pain. Start taking on: September 24, 2024 What changed: You were already taking a  medication with the same name, and this prescription was added. Make sure you understand how and when to take each. Changed by: Charlies Bellini   pantoprazole  40 MG tablet Commonly known as: PROTONIX  Take 1 tablet (40 mg total) by mouth daily.   simvastatin  10 MG tablet Commonly known as: ZOCOR  Take 1 tablet (10 mg total) by mouth every other day.       All past medical history, surgical history, allergies, family history, immunizations andmedications were updated in the EMR today and reviewed under the history and medication portions of their EMR.      Review of Systems  All other systems reviewed and are negative.  14 pt review of systems performed and negative (unless mentioned in an HPI)  Objective: BP 122/78   Pulse 72   Temp 98.2 F (36.8 C)   Wt 186 lb (84.4 kg)   SpO2 96%   BMI 23.88 kg/m  Physical Exam Vitals and nursing note reviewed.  Constitutional:      General: He is not in acute distress.    Appearance: Normal appearance. He is not ill-appearing, toxic-appearing or diaphoretic.  HENT:     Head: Normocephalic and atraumatic.  Eyes:     General: No scleral icterus.  Right eye: No discharge.        Left eye: No discharge.     Extraocular Movements: Extraocular movements intact.     Pupils: Pupils are equal, round, and reactive to light.  Cardiovascular:     Rate and Rhythm: Normal rate and regular rhythm.     Heart sounds: No murmur heard. Pulmonary:     Effort: Pulmonary effort is normal. No respiratory distress.     Breath sounds: Normal breath sounds. No wheezing, rhonchi or rales.  Musculoskeletal:     Cervical back: Neck supple.     Right lower leg: No edema.     Left lower leg: No edema.  Skin:    General: Skin is warm.     Findings: No rash.  Neurological:     Mental Status: He is alert and oriented to person, place, and time. Mental status is at baseline.  Psychiatric:        Mood and Affect: Mood normal.        Behavior: Behavior  normal.        Thought Content: Thought content normal.        Judgment: Judgment normal.     No results found.  Assessment/plan: Orren L Lincks is a 73 y.o. male present for chronic condition management Cervical neck pain with evidence of disc disease/Chronic midline thoracic back pain/Thoracic degenerative disc disease/DDD (degenerative disc disease), lumbar/History of lumbar discectomy He is following with sports medicine, physical therapy and orthopedics.  - current pain management is helping him maintain activity and improve his quality of life. -[place]  controlled substance database reviewed and appropriate 07/17/24 - Pain contract signed > UTD - UDS > UTD NSAIDs contraindicated with gastritis Continue gabapentin   600/600/600> did not tolerate higher dose (headache) Continue baclofen  10-20 mg 3 times daily Continue percocet 10-325 mg tID #90 prescribed with 2 refills (held) at pharmacy.  - f/u 3 mos   Essential hypertension, benign/dyslipidemia/overweight Stable Continue amlodipine  10 mg daily  Continue HCTZ. Continue lisinopril  10 mg daily Continue Zocor  10 mg q. OD - continue fish oil. - continue ASA 81 mg - routine diet and exercise. Labs UTD   Gastroesophageal reflux disease with esophagitis Continue Protonix  twice daily, Pepcid  twice daily Avoid all NSAIDs.  Caution on spicy foods and alcohol use.   Anxiety/sleep disturbance:  Stable Continue lexapro  20 mg qd.   OSA on CPAP Compliant  H/o Malignant neoplasm of upper lobe of left lung: S/p partial long left lobe lobectomy Follow w/up onc q 6 mos.   Return in 14 weeks (on 10/23/2024) for Routine chronic condition follow-up .   No orders of the defined types were placed in this encounter.  Meds ordered this encounter  Medications   oxyCODONE -acetaminophen  (PERCOCET) 10-325 MG tablet    Sig: Take 1 tablet by mouth every 8 (eight) hours as needed for pain.    Dispense:  90 tablet    Refill:  0    oxyCODONE -acetaminophen  (PERCOCET) 10-325 MG tablet    Sig: Take 1 tablet by mouth every 8 (eight) hours as needed for pain.    Dispense:  90 tablet    Refill:  0   simvastatin  (ZOCOR ) 10 MG tablet    Sig: Take 1 tablet (10 mg total) by mouth every other day.    Dispense:  45 tablet    Refill:  1   oxyCODONE -acetaminophen  (PERCOCET) 10-325 MG tablet    Sig: Take 1 tablet by mouth every 8 (eight) hours as needed  for pain.    Dispense:  90 tablet    Refill:  0   amLODipine  (NORVASC ) 10 MG tablet    Sig: Take 1 tablet (10 mg total) by mouth daily.    Dispense:  90 tablet    Refill:  1   escitalopram  (LEXAPRO ) 20 MG tablet    Sig: Take 1 tablet (20 mg total) by mouth daily.    Dispense:  90 tablet    Refill:  1   hydrochlorothiazide  (HYDRODIURIL ) 25 MG tablet    Sig: Take 1 tablet (25 mg total) by mouth daily.    Dispense:  90 tablet    Refill:  1   lisinopril  (ZESTRIL ) 10 MG tablet    Sig: Take 1 tablet (10 mg total) by mouth daily.    Dispense:  90 tablet    Refill:  1   pantoprazole  (PROTONIX ) 40 MG tablet    Sig: Take 1 tablet (40 mg total) by mouth daily.    Dispense:  90 tablet    Refill:  1   lubiprostone  (AMITIZA ) 24 MCG capsule    Sig: Take 1 capsule (24 mcg total) by mouth daily with breakfast.    Dispense:  90 capsule    Refill:  3   gabapentin  (NEURONTIN ) 600 MG tablet    Sig: Take 1 tablet (600 mg total) by mouth 3 (three) times daily.    Dispense:  270 tablet    Refill:  1   famotidine  (PEPCID ) 20 MG tablet    Sig: Take 1 tablet (20 mg total) by mouth 2 (two) times daily.    Dispense:  180 tablet    Refill:  1   oxyCODONE -acetaminophen  (PERCOCET) 10-325 MG tablet    Sig: Take 1 tablet by mouth every 8 (eight) hours as needed for pain.    Dispense:  90 tablet    Refill:  0   Referral Orders  No referral(s) requested today     Note is dictated utilizing voice recognition software. Although note has been proof read prior to signing, occasional  typographical errors still can be missed. If any questions arise, please do not hesitate to call for verification.  Electronically signed by: Charlies Bellini, DO [facility] [place]

## 2024-07-20 DIAGNOSIS — H2513 Age-related nuclear cataract, bilateral: Secondary | ICD-10-CM | POA: Diagnosis not present

## 2024-07-22 DIAGNOSIS — M542 Cervicalgia: Secondary | ICD-10-CM | POA: Diagnosis not present

## 2024-07-22 DIAGNOSIS — R293 Abnormal posture: Secondary | ICD-10-CM | POA: Diagnosis not present

## 2024-07-22 DIAGNOSIS — M25519 Pain in unspecified shoulder: Secondary | ICD-10-CM | POA: Diagnosis not present

## 2024-07-27 DIAGNOSIS — R293 Abnormal posture: Secondary | ICD-10-CM | POA: Diagnosis not present

## 2024-07-27 DIAGNOSIS — M25519 Pain in unspecified shoulder: Secondary | ICD-10-CM | POA: Diagnosis not present

## 2024-07-27 DIAGNOSIS — M542 Cervicalgia: Secondary | ICD-10-CM | POA: Diagnosis not present

## 2024-07-30 DIAGNOSIS — M25519 Pain in unspecified shoulder: Secondary | ICD-10-CM | POA: Diagnosis not present

## 2024-07-30 DIAGNOSIS — R293 Abnormal posture: Secondary | ICD-10-CM | POA: Diagnosis not present

## 2024-07-30 DIAGNOSIS — M542 Cervicalgia: Secondary | ICD-10-CM | POA: Diagnosis not present

## 2024-08-03 DIAGNOSIS — M25519 Pain in unspecified shoulder: Secondary | ICD-10-CM | POA: Diagnosis not present

## 2024-08-03 DIAGNOSIS — M542 Cervicalgia: Secondary | ICD-10-CM | POA: Diagnosis not present

## 2024-08-03 DIAGNOSIS — R293 Abnormal posture: Secondary | ICD-10-CM | POA: Diagnosis not present

## 2024-08-06 DIAGNOSIS — G4733 Obstructive sleep apnea (adult) (pediatric): Secondary | ICD-10-CM | POA: Diagnosis not present

## 2024-08-06 DIAGNOSIS — M5412 Radiculopathy, cervical region: Secondary | ICD-10-CM | POA: Diagnosis not present

## 2024-08-19 DIAGNOSIS — G4733 Obstructive sleep apnea (adult) (pediatric): Secondary | ICD-10-CM | POA: Diagnosis not present

## 2024-08-21 DIAGNOSIS — G8929 Other chronic pain: Secondary | ICD-10-CM | POA: Diagnosis not present

## 2024-08-21 DIAGNOSIS — M25511 Pain in right shoulder: Secondary | ICD-10-CM | POA: Diagnosis not present

## 2024-09-13 NOTE — Progress Notes (Signed)
 "  @Patient  ID: Nicholas Graham, male    DOB: [birth year], 73 y.o.   MRN: [id number]  Chief Complaint  Patient presents with   Consult    Sleep OSA CPAP [employer]. Epworth : 1    Referring provider: Catherine Charlies LABOR, DO  HPI: 73 year old male, former smoker quit in 2003 (90-pack-year history).  Past medical history significant for hypertension, OSA on CPAP.  Patient of Dr. Jude, last seen in office on 11/26/2018.  08/24/2022 Patient presents today for overdue follow-up for OSA.   He has severe OSA HST 05/2018 severe OSA, AHI 57/hour, lowest desaturation 67%  On auto CPAP 10-16cm h20 with large full face mask  He reports compliance with CPAP.   He is having trouble with nasal pillow mask. Continues to have snoring symptoms.  He would like to change masks to resmed airfit n20 nasal cpap mask  Due for new CPAP machine in October 2024 Recommend trying wedge pillow   Airview download 07/25/22-08/23/22 Usage 29/30 days (97%); 18 days (60%) > 4 hours Average usage days used 4 hours 23 min's Pressure 10-15cm h20 (12.1cm h20-95%) Airleaks 33L/min (95%) AHI 1.0       Sleep questionnaire Symptoms-   Wakes up often; Snoring  Prior sleep study- 2019 Bedtime- 8pm Time to fall asleep- 10 mins  Nocturnal awakenings- 2-3 times Out of bed/start of day- 5am Weight changes- down 30 lbs since 2020 Do you operate heavy machinery- no Do you currently wear CPAP- yes Do you current wear oxygen- no Epworth- 2   Imaging: CT chest without contrast 04/2016 showed calcified granulomas and calcified mediastinal lymphadenopathy with a 7 x 8 mm nodule in the left mid lung which had a speck of calcification eccentric.     Assessment & Plan:   OSA on CPAP - Hx severe OSA, AHI 57/hour. Well controlled on auto CPAP. He is 97% compliant with CPAP use over the last 90 days, average usage 4 hours 23 mins. He continues to have snoring symptoms. Current pressure 10-15cm h20 without residual apneas. He would like  to change masks, DME order placed for mask fitting and renew CPAP supplies. Recommend patient try wedge pillow to help with snoring. He has lost 30 lbs since 2020. FU with Dr. Neysa (new patient) in 1 year or sooner if needed.  Almarie LELON Ferrari, NP 08/24/2022 ============================================   03/24/24-  72 yoM former smoker (90 pkyrs, quit 2003) followed for OSA, complicated by HTN, Thrombocytopenia, LULobectomy for AdenoCa 2025,  HST 05/2018 severe OSA, AHI 57/hour, lowest desaturation 67% CPAP auto 10-15       was due for replacement October, 2024 Download compliance- 47%, AHI 1.2/hr   mostly short nights Body weight today-    186 lbs Dr Shelah >LUL resection by Thoracic Surgery 11/27/23. Dr Mohamed/ Oncology 4/10. CXR 01/17/24 IMPRESSION: Blunting of the left costophrenic angle, which may represent postsurgical pleural thickening or a left pleural effusion. Discussed the use of AI scribe software for clinical note transcription with the patient, who gave verbal consent to proceed.  History of Present Illness   Nicholas Graham is a 73 year old male with a history of lung cancer surgery and OSA who presents for follow-up regarding post-surgical chest pain and CPAP machine issues.  He experiences intermittent chest pain following lung cancer surgery, which varies in nature from sharp to spasm-like. Despite the discomfort, he continues to walk regularly, although he has reduced his distance from four miles to one and a half to  two miles, depending on the weather.  He has issues with his CPAP machine due to mask leaks, particularly because of his narrow nose and beard. Despite being refitted with a new machine and mask on April 14, 2024, the problem persists. He has tried different types of masks, including full face masks, but finds them uncomfortable. He is concerned about not reaching the goal of using the CPAP for at least four hours per night, as indicated by the machine's black bar  indicator.     Assessment and Plan:    Obstructive sleep apnea CPAP mask leakage affecting sleep quality. Recent refitting and new machine obtained. Difficulty achieving AIM-4 goal of four hours per night. - Encouraged contact with home care company for further adjustments to improve comfort and compliance. - Ordered refit for CPAP mask with home care company.  Post-thoracotomy pain syndrome Intermittent sharp chest pain due to nerve irritation and scarring from previous lung cancer surgery. Pain expected to persist but should eventually fade.  Lung cancer No concerns on recent chest x-ray. - Follow-up imaging and management by Dr. Deatrice.    09/15/24- 73 yoM former smoker (90 pkyrs, quit 2003) followed for OSA, complicated by HTN, Thrombocytopenia, LULobectomy for AdenoCa 2025, Cervical nerve root neuralgia,  HST 05/2018 severe OSA, AHI 57/hour, lowest desaturation 67% CPAP auto 10-15       was due for replacement October, 2024 Download compliance- 93%, AHI 1.3/hr   mostly short nights Body weight today-    192 lbs Dr Shelah >LUL resection by Thoracic Surgery 11/27/23. Dr Mohamed/ Oncology 4/10. Today needs surgical clearance for R shoulder surgery.     << Note PFT 11/15/23>> Asks help for insomnia, waking unable to return to sleep>> try trazodone  Discussed the use of AI scribe software for clinical note transcription with the patient, who gave verbal consent to proceed.  History of Present Illness   Nicholas Graham is a 73 year old male with obstructive sleep apnea and COPD who presents for surgical clearance for right shoulder surgery.  He has obstructive sleep apnea and uses CPAP for about three to five hours per night. He often wakes between 1 and 3 AM, has difficulty returning to sleep, and removes the mask after waking. He is concerned about poor sleep quality and has frequent nocturnal awakenings to urinate, which further interrupts CPAP use. We discussed trial of trazodone  for  insomnia.  He has COPD and feels his breathing is acceptable overall but avoids walking in the mornings because cold air worsens his breathing. He has a dry cough that has been more frequent over the past couple of nights without sputum. He denies any sense of infection.  He has non-small cell lung cancer status post resection and is followed by oncology. Currently no reported evidence of recurrence.     CT chest 06/15/24 IMPRESSION: 1. Postsurgical changes from left upper lobectomy. No residual or recurrent tumor. No metastatic disease identified within the chest. 2. Multiple other nonacute observations, as described above. Aortic Atherosclerosis (ICD10-I70.0).   Assessment and Plan:    Preoperative pulmonary risk assessment for right shoulder surgery No acute contraindications for surgery.  Chronic medical conditions stable. - Proceed with right shoulder surgery as planned.   Obstructive sleep apnea- benefits from CPAP Managed with CPAP. Reports 3-5 hours of use per night, aiming for at least 4 hours. Difficulty sleeping through the night. - Continue CPAP use, aiming for at least 4 hours per night. - Return to our sleep specialists for  further evaluation and management.  Chronic obstructive pulmonary disease- mixed type COPD with increased nocturnal cough. Uses Robitussin for cough. - Continue current COPD management plan. - Use Robitussin as needed for cough. - Advised on using a mask or scarf to manage cold air exposure during walks.  Insomnia Experiences insomnia, waking at night to urinate, difficulty returning to sleep. Previously tried Flexeril  with next-day drowsiness. Trazodone  prescribed. - Prescribed trazodone  for insomnia. - Advised to monitor for next-day drowsiness and adjust as needed.     ROS-see HPI   + = positive Constitutional:    weight loss, night sweats, fevers, chills, fatigue, lassitude. HEENT:    headaches, difficulty swallowing, tooth/dental problems,  sore throat,       sneezing, itching, ear ache, nasal congestion, post nasal drip, snoring CV:    chest pain, orthopnea, PND, swelling in lower extremities, anasarca, dizziness, palpitations Resp:   shortness of breath with exertion or at rest.               + productive cough,   non-productive cough, coughing up of blood.              change in color of mucus.  wheezing.   Skin:    rash or lesions. GI:  No-   heartburn, indigestion, + abdominal pain, nausea, vomiting, diarrhea,                + change in bowel habits, loss of appetite GU: dysuria, change in color of urine, no urgency or frequency.   flank pain. MS:   joint pain, stiffness, decreased range of motion, back pain. Neuro-     nothing unusual Psych:  change in mood or affect.  depression or anxiety.   memory loss.  OBJ- Physical Exam General- Alert, Oriented, Affect-appropriate, Distress- none acute Skin- rash-none, lesions- none, excoriation- none Lymphadenopathy- none Head- atraumatic            Eyes- Gross vision intact, PERRLA, conjunctivae and secretions clear            Ears- Hearing, canals-normal            Nose- Clear, no-Septal dev, mucus, polyps, erosion, perforation             Throat- Mallampati III , mucosa clear , drainage- none, tonsils- atrophic Neck- flexible , trachea midline, no stridor , thyroid  nl, carotid no bruit Chest - symmetrical excursion , unlabored           Heart/CV- RRR , no murmur , no gallop  , no rub, nl s1 s2                           - JVD- none , edema- none, stasis changes- none, varices- none           Lung- clear to P&A/ +diminished on L, wheeze- none, cough- none , dullness-none, rub- none           Chest wall-  Abd-  Br/ Gen/ Rectal- Not done, not indicated Extrem- cyanosis- none, clubbing, none, atrophy- none, strength- nl Neuro- grossly intact to observation "

## 2024-09-14 ENCOUNTER — Other Ambulatory Visit: Payer: Self-pay | Admitting: Interventional Radiology

## 2024-09-14 DIAGNOSIS — I722 Aneurysm of renal artery: Secondary | ICD-10-CM

## 2024-09-15 ENCOUNTER — Encounter: Payer: Self-pay | Admitting: Internal Medicine

## 2024-09-15 ENCOUNTER — Ambulatory Visit: Admitting: Internal Medicine

## 2024-09-15 VITALS — BP 124/72 | HR 71 | Ht 74.0 in | Wt 192.0 lb

## 2024-09-15 DIAGNOSIS — J449 Chronic obstructive pulmonary disease, unspecified: Secondary | ICD-10-CM | POA: Diagnosis not present

## 2024-09-15 DIAGNOSIS — G4700 Insomnia, unspecified: Secondary | ICD-10-CM

## 2024-09-15 DIAGNOSIS — Z01811 Encounter for preprocedural respiratory examination: Secondary | ICD-10-CM

## 2024-09-15 DIAGNOSIS — Z87891 Personal history of nicotine dependence: Secondary | ICD-10-CM

## 2024-09-15 DIAGNOSIS — G4733 Obstructive sleep apnea (adult) (pediatric): Secondary | ICD-10-CM | POA: Diagnosis not present

## 2024-09-15 MED ORDER — TRAZODONE HCL 50 MG PO TABS: ORAL_TABLET | ORAL | 5 refills | Status: AC

## 2024-09-15 NOTE — Patient Instructions (Addendum)
 You are clear from a pulmonary standpoint to go ahead with shoulder surgery.  We can continue CPAP  At checkout, ask the front desk to bring you back in 3-4 months, when your wife has appointment, to see Dr Theodoro.

## 2024-09-16 ENCOUNTER — Inpatient Hospital Stay
Admission: RE | Admit: 2024-09-16 | Discharge: 2024-09-16 | Attending: Interventional Radiology | Admitting: Interventional Radiology

## 2024-09-16 DIAGNOSIS — I722 Aneurysm of renal artery: Secondary | ICD-10-CM

## 2024-09-16 MED ORDER — IOPAMIDOL (ISOVUE-370) INJECTION 76%
100.0000 mL | Freq: Once | INTRAVENOUS | Status: AC | PRN
  Administered 2024-09-16: 100 mL via INTRAVENOUS

## 2024-09-21 ENCOUNTER — Ambulatory Visit: Payer: Self-pay

## 2024-09-21 NOTE — Telephone Encounter (Signed)
 Pt already triaged. Appts were cancelled. Call transferred to [facility], Shanda to reschedule.          Copied from CRM [id number]. Topic: Clinical - Red Word Triage >> Sep 21, 2024  4:29 PM Nicholas Graham wrote: [facility] that prompted transfer to Nurse Triage: Patient believes he has the flu. He has been having chest pains from coughing, weakness, fatigue, and headaches. He has an appointment scheduled for tomorrow, but the provider will not be available and now he is needing it rescheduled.

## 2024-09-21 NOTE — Telephone Encounter (Signed)
 FYI Only or Action Required?: FYI only for provider: ED advised.  Patient was last seen in primary care on 07/17/2024 by Catherine Fuller A, DO.  Called Nurse Triage reporting Fatigue.  Symptoms began a week ago.  Triage Disposition: Call EMS 911 Now  Patient/caregiver understands and will follow disposition?: No, wishes to speak with PCP            Copied from CRM #[id number]. Topic: Clinical - Red Word Triage >> Sep 21, 2024  8:08 AM Nicholas Graham wrote: Red Word that prompted transfer to Nurse Triage: Pain all over body, sore throat, cough, weakness, diarrhea since 12/31. Both her and spouse have the same symptoms. States it is getting worse and not better. Reason for Disposition  [1] SEVERE weakness (e.g., unable to walk or barely able to walk, requires support) AND [2] new-onset or getting worse  Answer Assessment - Initial Assessment Questions This RN recommends pt and his wife, Nicholas Graham, go to ED. Pt wife refused. This RN notified [facility] of refusal of ED disposition. Pt call back number is [phone].   Onset: last Mon Severe weakness, hold on to stuff to walk Dry cough (coughing up yellow mucous) Sore throat, husband 8-9/10 pain level Diarrhea Denies difficulty breathing, fever, blood in stool Denies recent abx use Denies dizziness  Protocols used: Weakness (Generalized) and Fatigue-A-AH

## 2024-09-22 ENCOUNTER — Ambulatory Visit: Admitting: Sports Medicine

## 2024-09-22 DIAGNOSIS — I722 Aneurysm of renal artery: Secondary | ICD-10-CM

## 2024-09-22 NOTE — Telephone Encounter (Signed)
 No further action needed at this time. Pt has appt for tomorrow.

## 2024-09-23 ENCOUNTER — Ambulatory Visit (INDEPENDENT_AMBULATORY_CARE_PROVIDER_SITE_OTHER): Admitting: Family Medicine

## 2024-09-23 ENCOUNTER — Ambulatory Visit: Payer: Self-pay | Admitting: Family Medicine

## 2024-09-23 ENCOUNTER — Ambulatory Visit

## 2024-09-23 VITALS — BP 124/74 | HR 92 | Temp 98.3°F | Wt 192.0 lb

## 2024-09-23 DIAGNOSIS — J988 Other specified respiratory disorders: Secondary | ICD-10-CM

## 2024-09-23 DIAGNOSIS — R051 Acute cough: Secondary | ICD-10-CM

## 2024-09-23 DIAGNOSIS — B9689 Other specified bacterial agents as the cause of diseases classified elsewhere: Secondary | ICD-10-CM

## 2024-09-23 DIAGNOSIS — R197 Diarrhea, unspecified: Secondary | ICD-10-CM

## 2024-09-23 MED ORDER — HYDROCODONE BIT-HOMATROP MBR 5-1.5 MG/5ML PO SOLN: 5.0000 mL | Freq: Four times a day (QID) | ORAL | 0 refills | Status: AC | PRN

## 2024-09-23 MED ORDER — AZITHROMYCIN 250 MG PO TABS: ORAL_TABLET | ORAL | 0 refills | Status: AC

## 2024-09-23 MED ORDER — DICYCLOMINE HCL 10 MG PO CAPS: 10.0000 mg | ORAL_CAPSULE | Freq: Three times a day (TID) | ORAL | 0 refills | Status: AC

## 2024-09-23 NOTE — Progress Notes (Signed)
 "      Nicholas Graham , 08-31-1951, 74 y.o., male MRN: [id number] Patient Care Team    Relationship Specialty Notifications Start End  Catherine Charlies LABOR, DO PCP - General Family Medicine  10/31/15   Barbarann Oneil BROCKS, MD (Inactive) Consulting Physician Orthopedic Surgery  10/26/21   Armbruster, Elspeth SQUIBB, MD Consulting Physician Gastroenterology  05/29/23   Elisabeth Valli BIRCH, MD Consulting Physician Urology  05/29/23   Neysa Reggy BIRCH, MD Consulting Physician Pulmonary Disease  10/08/23   Shyrl Linnie KIDD, MD Consulting Physician Cardiothoracic Surgery  11/21/23   Joane Artist RAMAN, MD Consulting Physician Sports Medicine  11/21/23   Beuford Oneil, MD Consulting Physician Orthopedic Surgery  07/17/24   Sherrod Sherrod, MD Consulting Physician Oncology  07/17/24     Chief Complaint  Patient presents with   Cough    1 week; cough, fatigue, diarrhea. Dayquil/Nyquil, Theraflu.      Subjective: Nicholas Graham is a 74 y.o. Pt presents for an OV with complaints of cough and fatigue of 9 days duration.  Associated symptoms include diarrhea.  He reports he had a low-grade fever on the first day of illness, and since then he has been fever free.  Denies chills.  This cough has been dry up until last couple days he started to have sputum production.  He has had diarrhea daily.  He has been drinking plenty of fluids including Pedialyte, eating soup when able.  He does endorse nausea without vomit.  Pt has tried DayQuil/NyQuil and TheraFlu to ease their symptoms.  He reports he feels very weak.  Denies shortness of breath     07/17/2024    8:20 AM 11/06/2023   11:23 AM 10/08/2023    8:19 AM 06/19/2023    9:02 AM 01/18/2023   10:26 AM  Depression screen PHQ 2/9  Decreased Interest 0 0 0 0 0  Down, Depressed, Hopeless 0 0 0 0 0  PHQ - 2 Score 0 0 0 0 0    Allergies[1] Social History   Social History Narrative   Married, [place]. Children (2) Adult, 4 grandchildren.    9 th grade, Retired.   Wears  seatbelt   Smoke detector in the home.    Firearms locked in the home.    Feels safe in his relationships.    Past Medical History:  Diagnosis Date   Arm fracture    left. Dx'd w/RSD post fracture   Arthritis    Carpal tunnel syndrome    Chest pain 2016   [facility] (Dr. Johnnye) doing stress echo, but feels like musculoskeletal chest wall pain is cause of his discomfort   Chronic back pain    COVID-19 06/2020   Deviated septum    Diverticulosis    GERD (gastroesophageal reflux disease)    Hyperlipidemia    Hypertension    Joint pain    Muscle pain    OSA on CPAP 07/04/2018   - Trial of CPAP therapy on 17 cm H2O with a Large size [employer] Full Face Mask Simplus mask and heated humidification.   Plantar fasciitis    Pulmonary nodule 04/2016   Ling[place].  Pt chose to repeat CT chest w/out contrast in 3 mo---as per radiologist's recommendations.   Right inguinal hernia    fat-containing.  Small hydrocele in left hemiscrotum.   Sleep apnea    Snoring    Swelling    of legs/feet/hands   Tobacco dependence in remission    Past Surgical  History:  Procedure Laterality Date   BRONCHIAL BIOPSY  10/28/2023   Procedure: BRONCHIAL BIOPSIES;  Surgeon: Shelah Lamar RAMAN, MD;  Location: [facility] ENDOSCOPY;  Service: Pulmonary;;   BRONCHIAL BRUSHINGS  10/28/2023   Procedure: BRONCHIAL BRUSHINGS;  Surgeon: Shelah Lamar RAMAN, MD;  Location[facility] ENDOSCOPY;  Service: Pulmonary;;   BRONCHIAL NEEDLE ASPIRATION BIOPSY  10/28/2023   Procedure: BRONCHIAL NEEDLE ASPIRATION BIOPSIES;  Surgeon: Shelah Lamar RAMAN, MD;  Location: [facility] ENDOSCOPY;  Service: Pulmonary;;   BRONCHIAL WASHINGS  10/28/2023   Procedure: BRONCHIAL WASHINGS;  Surgeon: Shelah Lamar RAMAN, MD;  Location: [facility] ENDOSCOPY;  Service: Pulmonary;;   COLONOSCOPY     fracture repair left arm     had a fixator   INGUINAL HERNIA REPAIR Left 2013   repeat hernia repair. x2   INTERCOSTAL NERVE BLOCK  11/27/2023   Procedure: BLOCK, NERVE,  INTERCOSTAL;  Surgeon: Shyrl Linnie KIDD, MD;  Location: [facility] OR;  Service: Thoracic;;   IR RADIOLOGIST EVAL & MGMT  07/22/2023   KNEE ARTHROSCOPY Right    LUMBAR DISC SURGERY  1993   LYMPH NODE BIOPSY  11/27/2023   Procedure: LYMPH NODE BIOPSY;  Surgeon: Shyrl Linnie KIDD, MD;  Location: [facility] OR;  Service: Thoracic;;   NEURECTOMY inguinal after hernia repair Left    PLANTAR FASCIA SURGERY Right 2000s   SHOULDER ARTHROSCOPY Right    UPPER GI ENDOSCOPY     Family History  Problem Relation Age of Onset   Lung cancer Mother    Lung cancer Father    Bone cancer Brother        bone   Heart disease Maternal Uncle    Colon cancer Neg Hx    Esophageal cancer Neg Hx    Stomach cancer Neg Hx    Allergies as of 09/23/2024       Reactions   Penicillins Hives   Celebrex [celecoxib] Other (See Comments)   unknown reaction   Cymbalta [duloxetine Hcl] Other (See Comments)   unknown        Medication List        Accurate as of September 23, 2024  4:28 PM. If you have any questions, ask your nurse or doctor.          amLODipine  10 MG tablet Commonly known as: NORVASC  Take 1 tablet (10 mg total) by mouth daily.   aspirin  81 MG tablet Take 81 mg by mouth daily.   azithromycin  250 MG tablet Commonly known as: ZITHROMAX  Take 2 tablets on day 1, then 1 tablet daily on days 2 through 5 Started by: Charlies Bellini, DO   dicyclomine  10 MG capsule Commonly known as: BENTYL  Take 1 capsule (10 mg total) by mouth 4 (four) times daily -  before meals and at bedtime. Started by: Charlies Bellini, DO   escitalopram  20 MG tablet Commonly known as: LEXAPRO  Take 1 tablet (20 mg total) by mouth daily.   famotidine  20 MG tablet Commonly known as: PEPCID  Take 1 tablet (20 mg total) by mouth 2 (two) times daily.   gabapentin  600 MG tablet Commonly known as: NEURONTIN  Take 1 tablet (600 mg total) by mouth 3 (three) times daily.   hydrochlorothiazide  25 MG tablet Commonly known as:  HYDRODIURIL  Take 1 tablet (25 mg total) by mouth daily.   HYDROcodone  bit-homatropine 5-1.5 MG/5ML syrup Commonly known as: Hycodan Take 5 mLs by mouth every 6 (six) hours as needed for cough. Started by: Charlies Bellini, DO   lisinopril  10 MG tablet Commonly known as:  ZESTRIL  Take 1 tablet (10 mg total) by mouth daily.   lubiprostone  24 MCG capsule Commonly known as: AMITIZA  Take 1 capsule (24 mcg total) by mouth daily with breakfast.   MegaRed Omega-3 Krill Oil 500 MG Caps Take 2 capsules by mouth daily.   metoCLOPramide  5 MG tablet Commonly known as: REGLAN  Take 1 tablet (5 mg total) by mouth 3 (three) times daily before meals.   Myrbetriq  50 MG Tb24 tablet Generic drug: mirabegron  ER Take 50 mg by mouth daily.   oxyCODONE -acetaminophen  10-325 MG tablet Commonly known as: PERCOCET Take 1 tablet by mouth every 8 (eight) hours as needed for pain.   oxyCODONE -acetaminophen  10-325 MG tablet Commonly known as: PERCOCET Take 1 tablet by mouth every 8 (eight) hours as needed for pain.   oxyCODONE -acetaminophen  10-325 MG tablet Commonly known as: PERCOCET Take 1 tablet by mouth every 8 (eight) hours as needed for pain.   oxyCODONE -acetaminophen  10-325 MG tablet Commonly known as: PERCOCET Take 1 tablet by mouth every 8 (eight) hours as needed for pain. Start taking on: September 24, 2024   pantoprazole  40 MG tablet Commonly known as: PROTONIX  Take 1 tablet (40 mg total) by mouth daily.   simvastatin  10 MG tablet Commonly known as: ZOCOR  Take 1 tablet (10 mg total) by mouth every other day.   traZODone  50 MG tablet Commonly known as: DESYREL  1 at bedtime as needed for sleep        All past medical history, surgical history, allergies, family history, immunizations andmedications were updated in the EMR today and reviewed under the history and medication portions of their EMR.     Review of Systems  Constitutional:  Positive for malaise/fatigue. Negative for chills  and fever.  HENT:  Positive for congestion, sinus pain and sore throat. Negative for ear pain.   Eyes: Negative.   Respiratory:  Positive for cough and sputum production. Negative for shortness of breath.   Cardiovascular: Negative.   Gastrointestinal:  Positive for diarrhea and nausea. Negative for abdominal pain, blood in stool, constipation, melena and vomiting.  Genitourinary: Negative.   Musculoskeletal:  Positive for myalgias.  Skin:  Negative for rash.  Neurological:  Positive for dizziness and headaches.   Negative, with the exception of above mentioned in HPI   Objective:  BP 124/74   Pulse 92   Temp 98.3 F (36.8 C)   Wt 192 lb (87.1 kg)   SpO2 97%   BMI 24.65 kg/m  Body mass index is 24.65 kg/m. Physical Exam Vitals and nursing note reviewed. Exam conducted with a chaperone present.  Constitutional:      General: He is not in acute distress.    Appearance: Normal appearance. He is ill-appearing. He is not toxic-appearing or diaphoretic.  HENT:     Head: Normocephalic and atraumatic.     Right Ear: Tympanic membrane, ear canal and external ear normal.     Left Ear: Tympanic membrane, ear canal and external ear normal.     Nose: Congestion and rhinorrhea present.     Mouth/Throat:     Mouth: Mucous membranes are moist.     Pharynx: Posterior oropharyngeal erythema present. No oropharyngeal exudate.  Eyes:     General: No scleral icterus.       Right eye: No discharge.        Left eye: No discharge.     Extraocular Movements: Extraocular movements intact.     Pupils: Pupils are equal, round, and reactive to light.  Cardiovascular:  Rate and Rhythm: Normal rate and regular rhythm.     Heart sounds: No murmur heard. Pulmonary:     Effort: Pulmonary effort is normal. No respiratory distress.     Breath sounds: Rhonchi present. No wheezing or rales.  Abdominal:     General: Abdomen is flat. Bowel sounds are normal.     Palpations: Abdomen is soft.      Tenderness: There is no abdominal tenderness.  Musculoskeletal:     Cervical back: Neck supple.     Right lower leg: No edema.     Left lower leg: No edema.  Lymphadenopathy:     Cervical: Cervical adenopathy present.  Skin:    General: Skin is warm.     Findings: No rash.  Neurological:     Mental Status: He is alert and oriented to person, place, and time. Mental status is at baseline.  Psychiatric:        Mood and Affect: Mood normal.        Behavior: Behavior normal.        Thought Content: Thought content normal.        Judgment: Judgment normal.    No results found. DG Chest 2 View Result Date: 09/23/2024 CLINICAL DATA:  Flu like symptoms 10 days. EXAM: CHEST - 2 VIEW COMPARISON:  01/17/2024 FINDINGS: Lungs are adequately inflated without focal airspace consolidation or effusion. Cardiomediastinal silhouette and remainder of the exam is unchanged. IMPRESSION: No active cardiopulmonary disease. Electronically Signed   By: Toribio Agreste M.D.   On: 09/23/2024 15:26   No results found for this or any previous visit (from the past 24 hours).  Assessment/Plan: Nicholas Graham is a 74 y.o. male present for OV for  Bacterial respiratory infection (Primary)/cough/diarrhea Suspect viral illness as the start of symptoms.  However symptoms are not improving for him or his wife who is also ill. Will collect labs today to check kidney/liver function, lites and blood cell counts. Chest x-ray ordered due to worsening symptoms and his history of lung cancer. - CBC w/Diff - Comp Met (CMET) - DG Chest 2 View; Future Start azithromycin  today Start Bentyl  30 minutes prior to meal to help decrease diarrhea and allow for adequate hydration. Hycodan cough syrup prescribed for cough.  He understands not to mix this with his chronic pain medicines. Continue to rest and push fluids. Follow-up in 1 week if symptoms are not starting to improve, sooner if worsening  Reviewed expectations re: course of  current medical issues. Discussed self-management of symptoms. Outlined signs and symptoms indicating need for more acute intervention. Patient verbalized understanding and all questions were answered. Patient received an After-Visit Summary.    Orders Placed This Encounter  Procedures   DG Chest 2 View   CBC w/Diff   Comp Met (CMET)   Meds ordered this encounter  Medications   azithromycin  (ZITHROMAX ) 250 MG tablet    Sig: Take 2 tablets on day 1, then 1 tablet daily on days 2 through 5    Dispense:  6 tablet    Refill:  0   dicyclomine  (BENTYL ) 10 MG capsule    Sig: Take 1 capsule (10 mg total) by mouth 4 (four) times daily -  before meals and at bedtime.    Dispense:  30 capsule    Refill:  0   HYDROcodone  bit-homatropine (HYCODAN) 5-1.5 MG/5ML syrup    Sig: Take 5 mLs by mouth every 6 (six) hours as needed for cough.    Dispense:  60 mL    Refill:  0   Referral Orders  No referral(s) requested today     Note is dictated utilizing voice recognition software. Although note has been proof read prior to signing, occasional typographical errors still can be missed. If any questions arise, please do not hesitate to call for verification.   electronically signed by:  Charlies Bellini, DO  [facility] - [place]       [1]  Allergies Allergen Reactions   Penicillins Hives   Celebrex [Celecoxib] Other (See Comments)    unknown reaction   Cymbalta [Duloxetine Hcl] Other (See Comments)    unknown   "

## 2024-09-24 ENCOUNTER — Encounter: Payer: Self-pay | Admitting: Internal Medicine

## 2024-09-24 LAB — CBC WITH DIFFERENTIAL/PLATELET
Absolute Lymphocytes: 1810 {cells}/uL (ref 850–3900)
Absolute Monocytes: 589 {cells}/uL (ref 200–950)
Basophils Absolute: 22 {cells}/uL (ref 0–200)
Basophils Relative: 0.5 %
Eosinophils Absolute: 69 {cells}/uL (ref 15–500)
Eosinophils Relative: 1.6 %
HCT: 44.7 % (ref 39.4–51.1)
Hemoglobin: 14.8 g/dL (ref 13.2–17.1)
MCH: 29.5 pg (ref 27.0–33.0)
MCHC: 33.1 g/dL (ref 31.6–35.4)
MCV: 89.2 fL (ref 81.4–101.7)
MPV: 11.3 fL (ref 7.5–12.5)
Monocytes Relative: 13.7 %
Neutro Abs: 1810 {cells}/uL (ref 1500–7800)
Neutrophils Relative %: 42.1 %
Platelets: 151 Thousand/uL (ref 140–400)
RBC: 5.01 Million/uL (ref 4.20–5.80)
RDW: 12.2 % (ref 11.0–15.0)
Total Lymphocyte: 42.1 %
WBC: 4.3 Thousand/uL (ref 3.8–10.8)

## 2024-09-24 LAB — COMPREHENSIVE METABOLIC PANEL WITH GFR
AG Ratio: 1.9 (calc) (ref 1.0–2.5)
ALT: 10 U/L (ref 9–46)
AST: 16 U/L (ref 10–35)
Albumin: 4.2 g/dL (ref 3.6–5.1)
Alkaline phosphatase (APISO): 63 U/L (ref 35–144)
BUN: 16 mg/dL (ref 7–25)
CO2: 29 mmol/L (ref 20–32)
Calcium: 8.8 mg/dL (ref 8.6–10.3)
Chloride: 98 mmol/L (ref 98–110)
Creat: 0.82 mg/dL (ref 0.70–1.28)
Globulin: 2.2 g/dL (ref 1.9–3.7)
Glucose, Bld: 100 mg/dL — ABNORMAL HIGH (ref 65–99)
Potassium: 4.3 mmol/L (ref 3.5–5.3)
Sodium: 135 mmol/L (ref 135–146)
Total Bilirubin: 0.6 mg/dL (ref 0.2–1.2)
Total Protein: 6.4 g/dL (ref 6.1–8.1)
eGFR: 94 mL/min/1.73m2

## 2024-10-28 ENCOUNTER — Ambulatory Visit: Admitting: Family Medicine

## 2024-11-11 ENCOUNTER — Ambulatory Visit

## 2024-12-09 ENCOUNTER — Encounter

## 2024-12-14 ENCOUNTER — Other Ambulatory Visit

## 2024-12-21 ENCOUNTER — Ambulatory Visit: Admitting: Internal Medicine
